# Patient Record
Sex: Female | Born: 1950
Health system: Southern US, Community
[De-identification: ages and names within clinical notes are randomized; demographics above are authoritative.]

## PROBLEM LIST (undated history)

## (undated) ENCOUNTER — Emergency Department (HOSPITAL_COMMUNITY): Payer: Medicare PPO | Source: Home / Self Care

## (undated) DIAGNOSIS — I10 Essential (primary) hypertension: Secondary | ICD-10-CM

## (undated) DIAGNOSIS — I779 Disorder of arteries and arterioles, unspecified: Secondary | ICD-10-CM

## (undated) DIAGNOSIS — R51 Headache: Secondary | ICD-10-CM

## (undated) DIAGNOSIS — K859 Acute pancreatitis without necrosis or infection, unspecified: Secondary | ICD-10-CM

## (undated) DIAGNOSIS — F419 Anxiety disorder, unspecified: Secondary | ICD-10-CM

## (undated) DIAGNOSIS — I341 Nonrheumatic mitral (valve) prolapse: Secondary | ICD-10-CM

## (undated) DIAGNOSIS — G709 Myoneural disorder, unspecified: Secondary | ICD-10-CM

## (undated) DIAGNOSIS — J45909 Unspecified asthma, uncomplicated: Secondary | ICD-10-CM

## (undated) DIAGNOSIS — M1990 Unspecified osteoarthritis, unspecified site: Secondary | ICD-10-CM

## (undated) DIAGNOSIS — T7840XA Allergy, unspecified, initial encounter: Secondary | ICD-10-CM

## (undated) DIAGNOSIS — K219 Gastro-esophageal reflux disease without esophagitis: Secondary | ICD-10-CM

## (undated) DIAGNOSIS — J189 Pneumonia, unspecified organism: Secondary | ICD-10-CM

## (undated) DIAGNOSIS — R011 Cardiac murmur, unspecified: Secondary | ICD-10-CM

## (undated) DIAGNOSIS — M810 Age-related osteoporosis without current pathological fracture: Secondary | ICD-10-CM

## (undated) DIAGNOSIS — D693 Immune thrombocytopenic purpura: Secondary | ICD-10-CM

## (undated) DIAGNOSIS — R519 Headache, unspecified: Secondary | ICD-10-CM

## (undated) HISTORY — DX: Acute pancreatitis without necrosis or infection, unspecified: K85.90

## (undated) HISTORY — DX: Allergy, unspecified, initial encounter: T78.40XA

## (undated) HISTORY — PX: ABDOMINAL HYSTERECTOMY: SHX81

## (undated) HISTORY — PX: SPLENECTOMY, TOTAL: SHX788

## (undated) HISTORY — PX: APPENDECTOMY: SHX54

## (undated) HISTORY — PX: OVARIAN CYST SURGERY: SHX726

## (undated) HISTORY — PX: JOINT REPLACEMENT: SHX530

## (undated) HISTORY — PX: SHOULDER ARTHROSCOPY WITH ROTATOR CUFF REPAIR: SHX5685

## (undated) HISTORY — PX: HERNIA REPAIR: SHX51

---

## 2000-10-05 ENCOUNTER — Other Ambulatory Visit: Admission: RE | Admit: 2000-10-05 | Discharge: 2000-10-05 | Payer: Self-pay | Admitting: Gynecology

## 2004-04-23 ENCOUNTER — Other Ambulatory Visit: Admission: RE | Admit: 2004-04-23 | Discharge: 2004-04-23 | Payer: Self-pay | Admitting: Gynecology

## 2004-09-03 ENCOUNTER — Ambulatory Visit: Payer: Self-pay | Admitting: Gastroenterology

## 2004-09-08 ENCOUNTER — Ambulatory Visit: Payer: Self-pay | Admitting: Gastroenterology

## 2004-09-28 ENCOUNTER — Ambulatory Visit: Payer: Self-pay | Admitting: Gastroenterology

## 2005-05-12 ENCOUNTER — Ambulatory Visit: Payer: Self-pay | Admitting: Gastroenterology

## 2006-06-16 ENCOUNTER — Other Ambulatory Visit: Payer: Self-pay

## 2006-06-21 ENCOUNTER — Ambulatory Visit: Payer: Self-pay | Admitting: Surgery

## 2006-12-12 ENCOUNTER — Emergency Department: Payer: Self-pay | Admitting: Emergency Medicine

## 2009-04-14 DIAGNOSIS — G43909 Migraine, unspecified, not intractable, without status migrainosus: Secondary | ICD-10-CM | POA: Insufficient documentation

## 2009-04-14 DIAGNOSIS — R011 Cardiac murmur, unspecified: Secondary | ICD-10-CM | POA: Insufficient documentation

## 2009-04-14 DIAGNOSIS — G4700 Insomnia, unspecified: Secondary | ICD-10-CM | POA: Insufficient documentation

## 2009-04-14 DIAGNOSIS — N958 Other specified menopausal and perimenopausal disorders: Secondary | ICD-10-CM | POA: Insufficient documentation

## 2009-04-14 DIAGNOSIS — F341 Dysthymic disorder: Secondary | ICD-10-CM | POA: Insufficient documentation

## 2009-04-14 DIAGNOSIS — I10 Essential (primary) hypertension: Secondary | ICD-10-CM | POA: Insufficient documentation

## 2009-05-05 ENCOUNTER — Ambulatory Visit: Payer: Self-pay | Admitting: Family Medicine

## 2009-12-24 ENCOUNTER — Ambulatory Visit: Payer: Self-pay | Admitting: General Practice

## 2010-01-04 ENCOUNTER — Ambulatory Visit: Payer: Self-pay | Admitting: General Practice

## 2010-01-10 ENCOUNTER — Observation Stay: Payer: Self-pay | Admitting: Internal Medicine

## 2010-06-27 ENCOUNTER — Emergency Department (HOSPITAL_COMMUNITY)
Admission: EM | Admit: 2010-06-27 | Discharge: 2010-06-27 | Payer: Self-pay | Source: Home / Self Care | Admitting: Emergency Medicine

## 2010-07-06 ENCOUNTER — Ambulatory Visit: Payer: Self-pay

## 2010-09-06 LAB — POCT I-STAT, CHEM 8
Glucose, Bld: 86 mg/dL (ref 70–99)
HCT: 42 % (ref 36.0–46.0)
Hemoglobin: 14.3 g/dL (ref 12.0–15.0)
Potassium: 3.5 mEq/L (ref 3.5–5.1)
Sodium: 141 mEq/L (ref 135–145)

## 2010-09-06 LAB — CBC
MCH: 32.4 pg (ref 26.0–34.0)
MCHC: 35.2 g/dL (ref 30.0–36.0)
Platelets: 164 10*3/uL (ref 150–400)
RDW: 13.9 % (ref 11.5–15.5)

## 2010-09-06 LAB — DIFFERENTIAL
Basophils Relative: 1 % (ref 0–1)
Eosinophils Absolute: 0.3 10*3/uL (ref 0.0–0.7)
Neutrophils Relative %: 54 % (ref 43–77)

## 2010-10-13 ENCOUNTER — Ambulatory Visit: Payer: Self-pay | Admitting: Family Medicine

## 2010-12-20 ENCOUNTER — Ambulatory Visit: Payer: Self-pay | Admitting: Ophthalmology

## 2010-12-28 ENCOUNTER — Ambulatory Visit: Payer: Self-pay | Admitting: Ophthalmology

## 2011-01-04 ENCOUNTER — Ambulatory Visit: Payer: Self-pay | Admitting: Family Medicine

## 2011-02-01 ENCOUNTER — Ambulatory Visit: Payer: Self-pay | Admitting: Ophthalmology

## 2011-09-05 ENCOUNTER — Ambulatory Visit: Payer: Self-pay | Admitting: Internal Medicine

## 2011-12-22 ENCOUNTER — Ambulatory Visit: Payer: Self-pay | Admitting: Family Medicine

## 2012-09-27 ENCOUNTER — Ambulatory Visit: Payer: Self-pay | Admitting: Family Medicine

## 2012-09-28 DIAGNOSIS — E559 Vitamin D deficiency, unspecified: Secondary | ICD-10-CM | POA: Insufficient documentation

## 2013-02-12 ENCOUNTER — Ambulatory Visit: Payer: Self-pay | Admitting: Family Medicine

## 2013-10-04 ENCOUNTER — Ambulatory Visit: Payer: Self-pay | Admitting: Orthopedic Surgery

## 2013-12-26 ENCOUNTER — Ambulatory Visit: Payer: Self-pay | Admitting: General Practice

## 2014-01-06 ENCOUNTER — Ambulatory Visit: Payer: Self-pay | Admitting: General Practice

## 2014-01-14 DIAGNOSIS — Z9889 Other specified postprocedural states: Secondary | ICD-10-CM | POA: Insufficient documentation

## 2014-10-18 NOTE — Op Note (Signed)
PATIENT NAME:  Heidi Hernandez, Heidi Hernandez MR#:  [id number] DATE OF BIRTH:  June 02, 1951  DATE OF PROCEDURE:  01/06/2014  PREOPERATIVE DIAGNOSIS: Left rotator cuff tear.   POSTOPERATIVE DIAGNOSIS: Left rotator cuff tear (supraspinatus).   PROCEDURE PERFORMED: Left subacromial decompression and rotator cuff repair.   SURGEON:  Dr. Francesco SorJames Hooten    ANESTHESIA: General.   ESTIMATED BLOOD LOSS: 75 mL.   FLUIDS REPLACED: 900 mL of crystalloid.   DRAINS: None.   IMPLANTS UTILIZED: ArthroCare 5.5 mm Spartan PEEK suture anchors x 2.   INDICATIONS FOR SURGERY: The patient is a 64 year old female who has been seen for complaints of persistent and progressive left shoulder pain. MRI demonstrated findings suggestive of partial thickness versus full-thickness rotator cuff tear. After discussion of the risks and benefits of surgical intervention, the patient expressed understanding of the risks, benefits, and agreed with plans for surgical intervention.   PROCEDURE IN DETAIL: The patient was brought into the operating room, and after adequate general anesthesia was achieved, the patient was placed in the modified beach chair position. Head was secured in headrest and all bony prominences were well padded. The patient's left shoulder and arm were cleaned and prepped with alcohol and DuraPrep draped in the usual sterile fashion. A "timeout" was performed as per usual protocol. The anticipated incision site was injected with 0.25% Marcaine with epinephrine. An anterior oblique incision was made roughly bisecting the anterior aspect of the acromion. The fibers of deltoid were split in line and a "deltoid on" approach was utilized by elevating the deltoid in a subperiosteal fashion off the acromion.  A thick and inflamed bursa was encountered and this was excised. A Darrach retractor was positioned so as to protect the rotator cuff. Approximately 3 mm thick anterior-inferior wafer of bone was removed using an osteotome.  Subsequent debridement and contouring of the subacromial region was performed using a TPS high-speed rasp. This allowed for excellent decompression of subacromial space. The shoulder was placed through a range of motion. There was an area of full-thickness tear of the supraspinatus tendon centrally. Minimal retraction was appreciated.  The attachment site was debrided of soft tissue down to bleeding bone using a rongeur. Two 5.5 mm Spartan PEEK suture anchors were inserted. The leading edge of the supraspinatus tendon was debrided and cleaned up sharply using Metzenbaum scissors. The leading edge of the supraspinatus tendon was then repaired using the associated sutures. Excellent repair was appreciated with good maintenance of repair noted with full range of motion of the shoulder. The wound was irrigated with copious amounts of normal saline with antibiotic solution using bulb syringe and then suctioned dry. Good hemostasis was appreciated. The deltoid was repaired in a side-to-side fashion using interrupted sutures of #1 Ethibond. The subcutaneous tissue was approximated using first #0 Vicryl followed by #2-0 Vicryl. The skin was closed with a running subcuticular suture of #4-0 Vicryl.  A sterile dressing was applied followed by application of a sling and ice wrap. The patient tolerated the procedure well. She was transported to the recovery room in stable condition.    ____________________________ Illene LabradorJames P. Angie FavaHooten Jr., MD jph:dd D: 01/06/2014 22:03:55 ET T: 01/07/2014 05:21:29 ET JOB#: [id number]  cc: Illene LabradorJames P. Angie FavaHooten Jr., MD, <Dictator> Illene LabradorJAMES P Angie FavaHOOTEN JR MD ELECTRONICALLY SIGNED 01/14/2014 19:44

## 2014-11-10 ENCOUNTER — Encounter
Admission: RE | Admit: 2014-11-10 | Discharge: 2014-11-10 | Disposition: A | Discharge status: Home / Self Care | Payer: Medicare PPO | Source: Ambulatory Visit | Attending: Orthopedic Surgery | Admitting: Orthopedic Surgery

## 2014-11-10 DIAGNOSIS — G5600 Carpal tunnel syndrome, unspecified upper limb: Secondary | ICD-10-CM | POA: Diagnosis not present

## 2014-11-10 HISTORY — DX: Headache: R51

## 2014-11-10 HISTORY — DX: Unspecified asthma, uncomplicated: J45.909

## 2014-11-10 HISTORY — DX: Headache, unspecified: R51.9

## 2014-11-10 HISTORY — DX: Nonrheumatic mitral (valve) prolapse: I34.1

## 2014-11-10 HISTORY — DX: Essential (primary) hypertension: I10

## 2014-11-10 HISTORY — DX: Unspecified osteoarthritis, unspecified site: M19.90

## 2014-11-10 NOTE — Patient Instructions (Signed)
  Your procedure is scheduled on: 11/19/14 Report to Day Surgery. To find out your arrival time please call [phone] between 1PM - 3PM on 11/18/14.  Remember: Instructions that are not followed completely may result in serious medical risk, up to and including death, or upon the discretion of your surgeon and anesthesiologist your surgery may need to be rescheduled.    ____ 1. Do not eat food or drink liquids after midnight. No gum chewing or hard candies.     ____ 2. No Alcohol for 24 hours before or after surgery.   ____ 3. Bring all medications with you on the day of surgery if instructed.    ____ 4. Notify your doctor if there is any change in your medical condition     (cold, fever, infections).     Do not wear jewelry, make-up, hairpins, clips or nail polish.  Do not wear lotions, powders, or perfumes. You may wear deodorant.  Do not shave 48 hours prior to surgery. Men may shave face and neck.  Do not bring valuables to the hospital.    [facility] any belongings or valuables.               Contacts, dentures or bridgework may not be worn into surgery.  Leave your suitcase in the car. After surgery it may be brought to your room.  For patients admitted to the hospital, discharge time is determined by your                treatment team.   Patients discharged the day of surgery will not be allowed to drive home.   Please read over the following fact sheets that you were given:   Surgical Site Infection Prevention   ____ Take these medicines the morning of surgery with A SIP OF WATER:    1.  2.   3.   4.  5.  6.  ____ Fleet Enema (as directed)   ___x_ Use CHG Soap as directed  _x___ Use inhalers on the day of surgery  ____ Stop metformin 2 days prior to surgery    ____ Take 1/2 of usual insulin dose the night before surgery and none on the morning of surgery.   ____ Stop Coumadin/Plavix/aspirin on   ____ Stop Anti-inflammatories on     ____ Stop supplements until after surgery.    ____ Bring C-Pap to the hospital.

## 2014-11-10 NOTE — OR Nursing (Signed)
ekg done 7/15 at [facility]

## 2014-11-19 ENCOUNTER — Ambulatory Visit: Payer: Medicare PPO | Admitting: Anesthesiology

## 2014-11-19 ENCOUNTER — Encounter: Payer: Self-pay | Admitting: Anesthesiology

## 2014-11-19 ENCOUNTER — Ambulatory Visit
Admission: RE | Admit: 2014-11-19 | Discharge: 2014-11-19 | Disposition: A | Discharge status: Home / Self Care | Payer: Medicare PPO | Source: Ambulatory Visit | Attending: Orthopedic Surgery | Admitting: Orthopedic Surgery

## 2014-11-19 ENCOUNTER — Encounter
Admission: RE | Disposition: A | Discharge status: Home / Self Care | Payer: Self-pay | Source: Ambulatory Visit | Attending: Orthopedic Surgery

## 2014-11-19 DIAGNOSIS — Z79899 Other long term (current) drug therapy: Secondary | ICD-10-CM | POA: Insufficient documentation

## 2014-11-19 DIAGNOSIS — Z87891 Personal history of nicotine dependence: Secondary | ICD-10-CM | POA: Diagnosis not present

## 2014-11-19 DIAGNOSIS — Z885 Allergy status to narcotic agent status: Secondary | ICD-10-CM | POA: Insufficient documentation

## 2014-11-19 DIAGNOSIS — G5601 Carpal tunnel syndrome, right upper limb: Secondary | ICD-10-CM | POA: Insufficient documentation

## 2014-11-19 DIAGNOSIS — G5602 Carpal tunnel syndrome, left upper limb: Secondary | ICD-10-CM | POA: Insufficient documentation

## 2014-11-19 DIAGNOSIS — G43909 Migraine, unspecified, not intractable, without status migrainosus: Secondary | ICD-10-CM | POA: Diagnosis not present

## 2014-11-19 DIAGNOSIS — Z888 Allergy status to other drugs, medicaments and biological substances status: Secondary | ICD-10-CM | POA: Insufficient documentation

## 2014-11-19 DIAGNOSIS — D693 Immune thrombocytopenic purpura: Secondary | ICD-10-CM | POA: Diagnosis not present

## 2014-11-19 DIAGNOSIS — M6530 Trigger finger, unspecified finger: Secondary | ICD-10-CM

## 2014-11-19 DIAGNOSIS — M65311 Trigger thumb, right thumb: Secondary | ICD-10-CM | POA: Insufficient documentation

## 2014-11-19 DIAGNOSIS — I10 Essential (primary) hypertension: Secondary | ICD-10-CM | POA: Diagnosis not present

## 2014-11-19 DIAGNOSIS — I341 Nonrheumatic mitral (valve) prolapse: Secondary | ICD-10-CM | POA: Insufficient documentation

## 2014-11-19 DIAGNOSIS — G5600 Carpal tunnel syndrome, unspecified upper limb: Secondary | ICD-10-CM

## 2014-11-19 HISTORY — PX: TRIGGER FINGER RELEASE: SHX641

## 2014-11-19 HISTORY — PX: CARPAL TUNNEL RELEASE: SHX101

## 2014-11-19 SURGERY — CARPAL TUNNEL RELEASE
Anesthesia: General | Site: Thumb | Laterality: Right

## 2014-11-19 MED ORDER — PROPOFOL 10 MG/ML IV BOLUS
INTRAVENOUS | Status: DC | PRN
  Administered 2014-11-19: 200 mg via INTRAVENOUS
  Administered 2014-11-19: 50 mg via INTRAVENOUS

## 2014-11-19 MED ORDER — NEOMYCIN-POLYMYXIN B GU 40-200000 IR SOLN
Status: AC
  Filled 2014-11-19: qty 2

## 2014-11-19 MED ORDER — GLYCOPYRROLATE 0.2 MG/ML IJ SOLN
INTRAMUSCULAR | Status: DC | PRN
  Administered 2014-11-19: 0.2 mg via INTRAVENOUS

## 2014-11-19 MED ORDER — ONDANSETRON HCL 4 MG/2ML IJ SOLN
INTRAMUSCULAR | Status: DC | PRN
Start: 2014-11-19 — End: 2014-11-19
  Administered 2014-11-19: 4 mg via INTRAVENOUS

## 2014-11-19 MED ORDER — MIDAZOLAM HCL 2 MG/2ML IJ SOLN
INTRAMUSCULAR | Status: DC | PRN
  Administered 2014-11-19: 2 mg via INTRAVENOUS

## 2014-11-19 MED ORDER — HYDROCODONE-ACETAMINOPHEN 5-325 MG PO TABS: 1.0000 | ORAL_TABLET | ORAL | Status: DC | PRN

## 2014-11-19 MED ORDER — ONDANSETRON HCL 4 MG/2ML IJ SOLN: 4.0000 mg | Freq: Once | INTRAMUSCULAR | Status: DC | PRN

## 2014-11-19 MED ORDER — METOCLOPRAMIDE HCL 5 MG/ML IJ SOLN: 5.0000 mg | Freq: Three times a day (TID) | INTRAMUSCULAR | Status: DC | PRN

## 2014-11-19 MED ORDER — BUPIVACAINE HCL (PF) 0.25 % IJ SOLN
INTRAMUSCULAR | Status: DC | PRN
  Administered 2014-11-19 (×2): 10 mL

## 2014-11-19 MED ORDER — CHLORHEXIDINE GLUCONATE 4 % EX LIQD
60.0000 mL | Freq: Once | CUTANEOUS | Status: AC
  Administered 2014-11-19: 4 via TOPICAL

## 2014-11-19 MED ORDER — ACETAMINOPHEN 10 MG/ML IV SOLN
INTRAVENOUS | Status: DC | PRN
  Administered 2014-11-19: 1000 mg via INTRAVENOUS

## 2014-11-19 MED ORDER — ONDANSETRON HCL 4 MG PO TABS: 4.0000 mg | ORAL_TABLET | Freq: Four times a day (QID) | ORAL | Status: DC | PRN

## 2014-11-19 MED ORDER — BUPIVACAINE HCL (PF) 0.25 % IJ SOLN
INTRAMUSCULAR | Status: AC
  Filled 2014-11-19: qty 30

## 2014-11-19 MED ORDER — NEOMYCIN-POLYMYXIN B GU 40-200000 IR SOLN
Status: DC | PRN
  Administered 2014-11-19: 200 mL

## 2014-11-19 MED ORDER — EPHEDRINE SULFATE 50 MG/ML IJ SOLN
INTRAMUSCULAR | Status: DC | PRN
  Administered 2014-11-19: 5 mg via INTRAVENOUS

## 2014-11-19 MED ORDER — METOCLOPRAMIDE HCL 10 MG PO TABS: 5.0000 mg | ORAL_TABLET | Freq: Three times a day (TID) | ORAL | Status: DC | PRN

## 2014-11-19 MED ORDER — FENTANYL CITRATE (PF) 100 MCG/2ML IJ SOLN
INTRAMUSCULAR | Status: DC | PRN
  Administered 2014-11-19 (×2): 50 ug via INTRAVENOUS
  Administered 2014-11-19: 25 ug via INTRAVENOUS
  Administered 2014-11-19: 50 ug via INTRAVENOUS
  Administered 2014-11-19: 25 ug via INTRAVENOUS
  Administered 2014-11-19: 50 ug via INTRAVENOUS

## 2014-11-19 MED ORDER — ONDANSETRON HCL 4 MG/2ML IJ SOLN: 4.0000 mg | Freq: Four times a day (QID) | INTRAMUSCULAR | Status: DC | PRN

## 2014-11-19 MED ORDER — LACTATED RINGERS IV SOLN
INTRAVENOUS | Status: DC
  Administered 2014-11-19: 15:00:00 via INTRAVENOUS

## 2014-11-19 MED ORDER — ACETAMINOPHEN 10 MG/ML IV SOLN
INTRAVENOUS | Status: AC
  Filled 2014-11-19: qty 100

## 2014-11-19 MED ORDER — LIDOCAINE HCL (PF) 1 % IJ SOLN
INTRAMUSCULAR | Status: AC
  Filled 2014-11-19: qty 2

## 2014-11-19 MED ORDER — FENTANYL CITRATE (PF) 100 MCG/2ML IJ SOLN: 25.0000 ug | INTRAMUSCULAR | Status: DC | PRN

## 2014-11-19 SURGICAL SUPPLY — 37 items
BANDAGE ELASTIC 3 CLIP NS LF (GAUZE/BANDAGES/DRESSINGS) ×6 IMPLANT
BANDAGE ELASTIC 3 CLIP ST LF (GAUZE/BANDAGES/DRESSINGS) ×3 IMPLANT
BLADE SURG 15 STRL LF DISP TIS (BLADE) ×4 IMPLANT
BLADE SURG 15 STRL SS (BLADE) ×2
BNDG ESMARK 4X12 TAN STRL LF (GAUZE/BANDAGES/DRESSINGS) ×3 IMPLANT
CANISTER SUCT 1200ML W/VALVE (MISCELLANEOUS) ×3 IMPLANT
CAST PADDING 3X4FT ST 30246 (SOFTGOODS) ×2
DECANTER SPIKE VIAL GLASS SM (MISCELLANEOUS) ×3 IMPLANT
DRAPE EXTREMITY 106X87X128.5 (DRAPES) ×3 IMPLANT
DRAPE IMP U-DRAPE 54X76 (DRAPES) ×3 IMPLANT
DRSG DERMACEA 8X12 NADH (GAUZE/BANDAGES/DRESSINGS) ×3 IMPLANT
DURAPREP 26ML APPLICATOR (WOUND CARE) ×9 IMPLANT
ELECT CAUTERY BLADE 6.4 (BLADE) ×3 IMPLANT
GAUZE SPONGE 4X4 12PLY STRL (GAUZE/BANDAGES/DRESSINGS) ×3 IMPLANT
GLOVE BIO SURGEON STRL SZ7 (GLOVE) ×6 IMPLANT
GLOVE BIOGEL M STRL SZ7.5 (GLOVE) ×6 IMPLANT
GLOVE INDICATOR 7.0 STRL GRN (GLOVE) ×3 IMPLANT
GLOVE INDICATOR 8.0 STRL GRN (GLOVE) ×3 IMPLANT
GOWN STRL REUS W/ TWL LRG LVL3 (GOWN DISPOSABLE) ×2 IMPLANT
GOWN STRL REUS W/TWL LRG LVL3 (GOWN DISPOSABLE) ×1
GOWN STRL REUS W/TWL XL LVL4 (GOWN DISPOSABLE) ×3 IMPLANT
IMPERVIOUS U DRAPE ×3 IMPLANT
NEEDLE FILTER BLUNT 18X 1/2SAF (NEEDLE) ×1
NEEDLE FILTER BLUNT 18X1 1/2 (NEEDLE) ×2 IMPLANT
NEEDLE HYPO 25X1 1.5 SAFETY (NEEDLE) ×3 IMPLANT
NS IRRIG 500ML POUR BTL (IV SOLUTION) ×3 IMPLANT
PACK EXTREMITY ARMC (MISCELLANEOUS) ×3 IMPLANT
PAD CAST CTTN 3X4 STRL (SOFTGOODS) ×4 IMPLANT
PAD GROUND ADULT SPLIT (MISCELLANEOUS) ×3 IMPLANT
SOL PREP PVP 2OZ (MISCELLANEOUS) ×3
SOLUTION PREP PVP 2OZ (MISCELLANEOUS) ×2 IMPLANT
SPLINT CAST 1 STEP 3X12 (MISCELLANEOUS) ×6 IMPLANT
STOCKINETTE STRL 4IN 9604848 (GAUZE/BANDAGES/DRESSINGS) ×6 IMPLANT
STRAP SAFETY BODY (MISCELLANEOUS) ×3 IMPLANT
SUT ETHILON 5-0 FS-2 18 BLK (SUTURE) ×6 IMPLANT
SYR 5ML LL (SYRINGE) ×3 IMPLANT
VIALFLOW ×3 IMPLANT

## 2014-11-19 NOTE — Op Note (Signed)
DATE OF SURGERY:  11/19/2014  PATIENT NAME:  Heidi Hernandez   DOB: 04/03/1951  MRN: [id number]  PRE-OPERATIVE DIAGNOSIS: Bilateral carpal tunnel syndrome, stenosing tenosynovitis of the right thumb  POST-OPERATIVE DIAGNOSIS:  Same  PROCEDURE:  Bilateral carpal tunnel release, Right trigger thumb release  SURGEON:  Jena GaussJames P Hooten, Jr. M.D.  ANESTHESIA: general  ESTIMATED BLOOD LOSS: Minimal  TOURNIQUET TIME: 21 minutes - LEFT     33 minutes - RIGHT  DRAINS: None  INDICATIONS FOR SURGERY: Heidi Hernandez is a 64 y.o. year old female with a long history of numbness and paresthesias to both hands as well as triggering of the right thumb. EMG/nerve conduction studies demonstrated findings consistent with bilateral carpal tunnel syndrome.The patient had not seen any significant improvement despite conservative nonsurgical intervention. After discussion of the risks and benefits of surgical intervention, the patient expressed understanding of the risks benefits and agree with plans for bilateral carpal tunnel release and right trigger thumb release.   PROCEDURE IN DETAIL: The patient was brought into the operating room and after adequate general anesthesia, a tourniquet was placed on the patient's upper arms. Both hands and arms were prepped with alcohol and Duraprep and draped in the usual sterile fashion. A "time-out" was performed as per usual protocol. The left hand and forearm were exsanguinated using an Esmarch and the tourniquet was inflated to 250 mmHg. Loupe magnification was used throughout the procedure. An incision was made just ulnar to the thenar palmar crease. Dissection was carried down through the palmar fascia to the transverse carpal ligament. The transverse carpal ligament was sharply incised, taking care to protect the underlying structures with the carpal tunnel. Complete release of the transverse carpal ligament was achieved. There was no evidence of ganglion cyst or lipoma  within the carpal tunnel. The wound was irrigated with copious amounts of normal saline with antibiotic solution. The skin was then re-approximated with interrupted sutures of #5-0 nylon. The incision was then injected with 10 mL of 0.25% Marcaine. A sterile dressing was applied followed by application of a volar splint. The tourniquet to the left arm was deflated with a total tourniquet time of 21 minutes.  Next, attention was directed to the right upper extremity. The right hand and forearm were exsanguinated using an Esmarch and the tourniquet was inflated to 250 mmHg. An incision was made just ulnar to the thenar palmar crease. Dissection was carried down through the palmar fascia to the transverse carpal ligament. The transverse carpal ligament was sharply incised, taking care to protect the underlying structures with the carpal tunnel. Complete release of the transverse carpal ligament was achieved. There was no evidence of ganglion cyst or lipoma within the carpal tunnel. The wound was irrigated with copious amounts of normal saline with antibiotic solution. The skin was then re-approximated with interrupted sutures of #5-0 nylon. A transverse incision was then made at the base of the right thumb. Blunt dissection was carried down to the A1 pulley and the A1 pulley was sharply incised. A moderate amount of tenosynovial fluid was evacuated. Smooth excursion of the flexor tendon was noted without any triggering. The wound was irrigated with copious amounts of normal saline, solution and the skin edges were then reapproximated using #5-0 nylon. The incision sites were then injected with 10 mL of 0.25% Marcaine. A sterile dressing was applied followed by application of a volar splint. The tourniquet to the right arm was deflated with a total tourniquet time of 33 minutes.  The patient tolerated the procedure well and was transported to the PACU in stable condition.  James P. Holley Bouche., M.D.

## 2014-11-19 NOTE — Anesthesia Procedure Notes (Signed)
Procedure Name: LMA Insertion Date/Time: 11/19/2014 3:59 PM Performed by: Omer JackWEATHERLY, Heidi Hernandez: Patient identified, Emergency Drugs available, Suction available, Patient being monitored and Timeout performed Patient Re-evaluated:Patient Re-evaluated prior to inductionOxygen Delivery Method: Circle system utilized Preoxygenation: Pre-oxygenation with 100% oxygen Intubation Type: IV induction Ventilation: Mask ventilation without difficulty LMA: LMA inserted LMA Size: 3.5 Placement Confirmation: breath sounds checked- equal and bilateral and positive ETCO2 Tube secured with: Tape Dental Injury: Teeth and Oropharynx as per pre-operative assessment

## 2014-11-19 NOTE — Anesthesia Preprocedure Evaluation (Addendum)
Anesthesia Evaluation  Patient identified by MRN, date of birth, ID band Patient awake    Reviewed: Allergy & Precautions, H&P , NPO status , Patient's Chart, lab work & pertinent test results  Airway Mallampati: II  TM Distance: >3 FB Neck ROM: Full    Dental  (+) Chipped   Pulmonary asthma , former smoker,          Cardiovascular Exercise Tolerance: Good hypertension,     Neuro/Psych  Headaches,    GI/Hepatic   Endo/Other    Renal/GU      Musculoskeletal  (+) Arthritis -,   Abdominal   Peds  Hematology   Anesthesia Other Findings MVP.  Reproductive/Obstetrics                        Anesthesia Physical Anesthesia Plan  ASA: II  Anesthesia Plan: General   Post-op Pain Management:    Induction:   Airway Management Planned: LMA  Additional Equipment:   Intra-op Plan:   Post-operative Plan:   Informed Consent: I have reviewed the patients History and Physical, chart, labs and discussed the procedure including the risks, benefits and alternatives for the proposed anesthesia with the patient or authorized representative who has indicated his/her understanding and acceptance.     Plan Discussed with: CRNA  Anesthesia Plan Comments:        Anesthesia Quick Evaluation

## 2014-11-19 NOTE — H&P (Signed)
The patient has been re-examined, and the chart reviewed, and there have been no interval changes to the documented history and physical.    The risks, benefits, and alternatives have been discussed at length, and the patient is willing to proceed.   

## 2014-11-19 NOTE — Brief Op Note (Signed)
11/19/2014  5:48 PM  PATIENT:  Haywood Paoarol M Joffe  64 y.o. female  PRE-OPERATIVE DIAGNOSIS:  bil carpal tunnel syndrome,,right trigger thumb  POST-OPERATIVE DIAGNOSIS:  same as pre-op  PROCEDURE:  Procedure(s): CARPAL TUNNEL RELEASE (Bilateral) RELEASE TRIGGER FINGER/A-1 PULLEY (Right) thumb  SURGEON:  Surgeon(s) and Role:    * Donato HeinzJames P Hooten, MD - Primary  ASSISTANTS: none   ANESTHESIA:   general  EBL:     BLOOD ADMINISTERED:none  DRAINS: none   LOCAL MEDICATIONS USED:  MARCAINE     SPECIMEN:  No Specimen  DISPOSITION OF SPECIMEN:  N/A  COUNTS:  YES  TOURNIQUET:  LEFT - 21 min      RIGHT - 33 min  DICTATION: .Reubin Milanragon Dictation  PLAN OF CARE: Discharge to home after PACU  PATIENT DISPOSITION:  PACU - hemodynamically stable.   Delay start of Pharmacological VTE agent (>24hrs) due to surgical blood loss or risk of bleeding: not applicable

## 2014-11-19 NOTE — Anesthesia Postprocedure Evaluation (Signed)
  Anesthesia Post-op Note  Patient: Heidi Hernandez  Procedure(s) Performed: Procedure(s): CARPAL TUNNEL RELEASE (Bilateral) RELEASE TRIGGER FINGER/A-1 PULLEY (Right)  Anesthesia type:General  Patient location: PACU  Post pain: Pain level controlled  Post assessment: Post-op Vital signs reviewed, Patient's Cardiovascular Status Stable, Respiratory Function Stable, Patent Airway and No signs of Nausea or vomiting  Post vital signs: Reviewed and stable  Last Vitals:  Filed Vitals:   11/19/14 1802  BP: 172/61  Pulse: 70  Temp:   Resp: 12    Level of consciousness: awake, alert  and patient cooperative  Complications: No apparent anesthesia complications

## 2014-11-19 NOTE — Discharge Instructions (Addendum)
General Anesthesia °General anesthesia is a sleep-like state of non-feeling produced by medicines (anesthetics). General anesthesia prevents you from being alert and feeling pain during a medical procedure. Your caregiver may recommend general anesthesia if your procedure: °· Is long. °· Is painful or uncomfortable. °· Would be frightening to see or hear. °· Requires you to be still. °· Affects your breathing. °· Causes significant blood loss. °LET YOUR CAREGIVER KNOW ABOUT: °· Allergies to food or medicine. °· Medicines taken, including vitamins, herbs, eyedrops, over-the-counter medicines, and creams. °· Use of steroids (by mouth or creams). °· Previous problems with anesthetics or numbing medicines, including problems experienced by relatives. °· History of bleeding problems or blood clots. °· Previous surgeries and types of anesthetics received. °· Possibility of pregnancy, if this applies. °· Use of cigarettes, alcohol, or illegal drugs. °· Any health condition(s), especially diabetes, sleep apnea, and high blood pressure. °RISKS AND COMPLICATIONS °General anesthesia rarely causes complications. However, if complications do occur, they can be life threatening. Complications include: °· A lung infection. °· A stroke. °· A heart attack. °· Waking up during the procedure. When this occurs, the patient may be unable to move and communicate that he or she is awake. The patient may feel severe pain. °Older adults and adults with serious medical problems are more likely to have complications than adults who are young and healthy. Some complications can be prevented by answering all of your caregiver's questions thoroughly and by following all pre-procedure instructions. It is important to tell your caregiver if any of the pre-procedure instructions, especially those related to diet, were not followed. Any food or liquid in the stomach can cause problems when you are under general anesthesia. °BEFORE THE  PROCEDURE °· Ask your caregiver if you will have to spend the night at the hospital. If you will not have to spend the night, arrange to have an adult drive you and stay with you for 24 hours. °· Follow your caregiver's instructions if you are taking dietary supplements or medicines. Your caregiver may tell you to stop taking them or to reduce your dosage. °· Do not smoke for as long as possible before your procedure. If possible, stop smoking 3-6 weeks before the procedure. °· Do not take new dietary supplements or medicines within 1 week of your procedure unless your caregiver approves them. °· Do not eat within 8 hours of your procedure or as directed by your caregiver. Drink only clear liquids, such as water, black coffee (without milk or cream), and fruit juices (without pulp). °· Do not drink within 3 hours of your procedure or as directed by your caregiver. °· You may brush your teeth on the morning of the procedure, but make sure to spit out the toothpaste and water when finished. °PROCEDURE  °You will receive anesthetics through a mask, through an intravenous (IV) access tube, or through both. A doctor who specializes in anesthesia (anesthesiologist) or a nurse who specializes in anesthesia (nurse anesthetist) or both will stay with you throughout the procedure to make sure you remain unconscious. He or she will also watch your blood pressure, pulse, and oxygen levels to make sure that the anesthetics do not cause any problems. Once you are asleep, a breathing tube or mask may be used to help you breathe. °AFTER THE PROCEDURE °You will wake up after the procedure is complete. You may be in the room where the procedure was performed or in a recovery area. You may have a sore throat if   a breathing tube was used. You may also feel:  Dizzy.  Weak.  Drowsy.  Confused.  Nauseous.  Cold. These are all normal responses and can be expected to last for up to 24 hours after the procedure is complete. A  caregiver will tell you when you are ready to go home. This will usually be when you are fully awake and in stable condition. Document Released: 09/20/2007 Document Revised: 10/28/2013 Document Reviewed: 10/12/2011 [facility] Patient Information 2015 [employer] [place]. This information is not intended to replace advice given to you by your health care provider. Make sure you discuss any questions you have with your health care provider.   PER DR HOOTEN - ACTIVITY AS TOLERATED                                   KEEP OPERATIVE EXTREMITIES ELEVATED ON PILLOWS                                   FOLLOW DR HOOTEN'S POSTOP HAND/WRIST SURGERY INSTRUCTIONS AS REVIEWED

## 2014-11-19 NOTE — Transfer of Care (Signed)
Immediate Anesthesia Transfer of Care Note  Patient: Heidi PaoCarol M Droege  Procedure(s) Performed: Procedure(s): CARPAL TUNNEL RELEASE (Bilateral) RELEASE TRIGGER FINGER/A-1 PULLEY (Right)  Patient Location: PACU  Anesthesia Type:General  Level of Consciousness: sedated  Airway & Oxygen Therapy: Patient connected to face mask oxygen  Post-op Assessment: Report given to RN  Post vital signs: stable  Last Vitals: There were no vitals filed for this visit.  Complications: No apparent anesthesia complications

## 2014-11-20 ENCOUNTER — Encounter: Payer: Self-pay | Admitting: Orthopedic Surgery

## 2014-12-23 ENCOUNTER — Ambulatory Visit
Admission: RE | Admit: 2014-12-23 | Discharge: 2014-12-23 | Disposition: A | Discharge status: Home / Self Care | Payer: Medicare PPO | Source: Ambulatory Visit | Attending: Family Medicine | Admitting: Family Medicine

## 2014-12-23 ENCOUNTER — Inpatient Hospital Stay: Admit: 2014-12-23 | Payer: Self-pay

## 2014-12-23 ENCOUNTER — Ambulatory Visit (INDEPENDENT_AMBULATORY_CARE_PROVIDER_SITE_OTHER): Payer: Medicare PPO | Admitting: Family Medicine

## 2014-12-23 ENCOUNTER — Encounter: Payer: Self-pay | Admitting: Family Medicine

## 2014-12-23 VITALS — BP 128/72 | HR 62 | Temp 99.0°F | Resp 16 | Wt 132.0 lb

## 2014-12-23 DIAGNOSIS — Z862 Personal history of diseases of the blood and blood-forming organs and certain disorders involving the immune mechanism: Secondary | ICD-10-CM | POA: Insufficient documentation

## 2014-12-23 DIAGNOSIS — M810 Age-related osteoporosis without current pathological fracture: Secondary | ICD-10-CM | POA: Insufficient documentation

## 2014-12-23 DIAGNOSIS — I341 Nonrheumatic mitral (valve) prolapse: Secondary | ICD-10-CM | POA: Insufficient documentation

## 2014-12-23 DIAGNOSIS — G2581 Restless legs syndrome: Secondary | ICD-10-CM | POA: Insufficient documentation

## 2014-12-23 DIAGNOSIS — M5430 Sciatica, unspecified side: Secondary | ICD-10-CM | POA: Insufficient documentation

## 2014-12-23 DIAGNOSIS — M11272 Other chondrocalcinosis, left ankle and foot: Secondary | ICD-10-CM

## 2014-12-23 DIAGNOSIS — J309 Allergic rhinitis, unspecified: Secondary | ICD-10-CM | POA: Insufficient documentation

## 2014-12-23 DIAGNOSIS — M25572 Pain in left ankle and joints of left foot: Secondary | ICD-10-CM

## 2014-12-23 DIAGNOSIS — I517 Cardiomegaly: Secondary | ICD-10-CM | POA: Insufficient documentation

## 2014-12-23 DIAGNOSIS — F419 Anxiety disorder, unspecified: Secondary | ICD-10-CM | POA: Insufficient documentation

## 2014-12-23 DIAGNOSIS — Z9081 Acquired absence of spleen: Secondary | ICD-10-CM | POA: Insufficient documentation

## 2014-12-23 DIAGNOSIS — L570 Actinic keratosis: Secondary | ICD-10-CM | POA: Insufficient documentation

## 2014-12-23 NOTE — Progress Notes (Signed)
Patient ID: Heidi PaoCarol M Thurmon, female   DOB: 10/06/1950, 64 y.o.   MRN: [id number]       Patient: Heidi Hernandez Female    DOB: 09/08/1950   64 y.o.   MRN: [id number] Visit Date: 12/23/2014  Today's Provider: Mila Merryonald Fisher, MD   Chief Complaint  Patient presents with  . Foot Swelling   Subjective:    HPI Pt reports that she has swelling in her left ankle that has been going on for about 3 months. She does remember doing anything to it. She reports that it feels warm to the touch and is very painful. No known injury. She wore an ankle brace for a few weeks when she was traveling, but couldn't tell if it helped   Previous Medications   ALBUTEROL (VENTOLIN HFA) 108 (90 BASE) MCG/ACT INHALER    Inhale into the lungs.   ALENDRONATE (FOSAMAX) 70 MG TABLET    Take by mouth.   ATENOLOL (TENORMIN) 50 MG TABLET    Take 50 mg by mouth daily. pm   AZELASTINE (ASTELIN) 0.1 % NASAL SPRAY    Place 2 sprays into both nostrils 2 (two) times daily. Use in each nostril as directed   CETIRIZINE (ZYRTEC) 10 MG TABLET    Take 10 mg by mouth daily. pm   CICLESONIDE (ALVESCO) 80 MCG/ACT INHALER    Inhale 1 puff into the lungs 2 (two) times daily.   FLUTICASONE (FLONASE) 50 MCG/ACT NASAL SPRAY       MONTELUKAST (SINGULAIR) 10 MG TABLET    Take 10 mg by mouth at bedtime.   PRAMIPEXOLE (MIRAPEX) 1 MG TABLET    Take 1 mg by mouth 2 (two) times daily.   SUMATRIPTAN (IMITREX) 100 MG TABLET    Take 100 mg by mouth every 2 (two) hours as needed for migraine. May repeat in 2 hours if headache persists or recurs.   TRAMADOL (ULTRAM) 50 MG TABLET    Take 50 mg by mouth every 6 (six) hours as needed. 1 to two tabs   VITAMIN D, ERGOCALCIFEROL, (DRISDOL) 50000 UNITS CAPS CAPSULE    Take by mouth.   ZOLPIDEM (AMBIEN) 10 MG TABLET    Take by mouth.    Review of Systems  Constitutional: Negative.   HENT: Negative.   Eyes: Negative.   Respiratory: Negative.   Cardiovascular: Negative.   Gastrointestinal: Negative.     Endocrine: Negative.   Genitourinary: Negative.   Musculoskeletal: Positive for joint swelling and arthralgias.  Skin: Negative.   Allergic/Immunologic: Negative.   Neurological: Negative.   Hematological: Negative.   Psychiatric/Behavioral: Negative.     History  Substance Use Topics  . Smoking status: Former Smoker    Quit date: 11/10/2014  . Smokeless tobacco: Not on file  . Alcohol Use: Yes     Comment: rare   Objective:   BP 128/72 mmHg  Pulse 62  Temp(Src) 99 F (37.2 C) (Oral)  Resp 16  Wt 132 lb (59.875 kg)  Physical Exam  General Appearance:    Alert, cooperative, no distress  Eyes:    PERRL, conjunctiva/corneas clear, EOM's intact       Lungs:     Clear to auscultation bilaterally, respirations unlabored  Heart:    Regular rate and rhythm  Neurologic:   Awake, alert, oriented x 3. No apparent focal neurological           defect.   MS:   Tender around left lateral malleolus. Mildly swollen surrounding tissue. No  erythema. FROM with pain on limits of passive ROM        Assessment & Plan:     1. Left ankle pain Likely sprain of talo-fibular ligament. Rule out stress fracture. If XRay negative will need to wear ankle brace for 4-6 weeks.  - DG Ankle Complete Left; Future  [name], MD  [facility]

## 2014-12-24 ENCOUNTER — Telehealth: Payer: Self-pay

## 2014-12-24 DIAGNOSIS — M25579 Pain in unspecified ankle and joints of unspecified foot: Secondary | ICD-10-CM

## 2014-12-24 DIAGNOSIS — M11272 Other chondrocalcinosis, left ankle and foot: Secondary | ICD-10-CM | POA: Insufficient documentation

## 2014-12-24 MED ORDER — NAPROXEN 500 MG PO TABS: 500.0000 mg | ORAL_TABLET | Freq: Two times a day (BID) | ORAL | Status: AC

## 2014-12-24 NOTE — Telephone Encounter (Signed)
-----   Message from Malva Limesonald E Fisher, MD sent at 12/24/2014 12:37 PM EDT ----- Please advise patient that Xray shows small calcium crystals in ankle joint, which is probably causing the swelling, pain and inflammation in ankle. Treatment is anti-inflammatory medications. Recommend naprosyn 500mg  one tablet twice a day for 14 days. Call if not much better when finished with naprosyn.

## 2014-12-24 NOTE — Telephone Encounter (Signed)
Patient advised and verbally voiced understanding. Prescription called into pharmacy.

## 2014-12-30 ENCOUNTER — Encounter: Payer: Self-pay | Admitting: Family Medicine

## 2014-12-30 ENCOUNTER — Telehealth: Payer: Self-pay | Admitting: Family Medicine

## 2014-12-30 ENCOUNTER — Ambulatory Visit (INDEPENDENT_AMBULATORY_CARE_PROVIDER_SITE_OTHER): Payer: Medicare PPO | Admitting: Family Medicine

## 2014-12-30 VITALS — BP 122/76 | HR 62 | Temp 98.7°F | Resp 16 | Wt 136.8 lb

## 2014-12-30 DIAGNOSIS — J45901 Unspecified asthma with (acute) exacerbation: Secondary | ICD-10-CM

## 2014-12-30 DIAGNOSIS — J0100 Acute maxillary sinusitis, unspecified: Secondary | ICD-10-CM | POA: Diagnosis not present

## 2014-12-30 DIAGNOSIS — Q8901 Asplenia (congenital): Secondary | ICD-10-CM

## 2014-12-30 MED ORDER — LEVOFLOXACIN 500 MG PO TABS: 500.0000 mg | ORAL_TABLET | Freq: Every day | ORAL | Status: DC

## 2014-12-30 NOTE — Progress Notes (Signed)
Subjective:    Patient ID: Heidi Hernandez, female    DOB: 05/18/1951, 64 y.o.   MRN: [id number]  HPI Cough onset 2 days ago that progressed to nasal congestion, rhinorrhea and sneezing. Some wheezing and history of asthma with allergies requiring allergy shots once a week. Frequently has recurrence of sinusitis and some bronchitis with asthma. Has a history of splenectomy due to ITP in 1990. Has 2 brother with histories of ITP, also. Contacted the [facility] yesterday and was given one Amoxicillin.  Past Medical History  Diagnosis Date  . Asthma     very mild  . MVP (mitral valve prolapse)   . Arthritis    Past Surgical History  Procedure Laterality Date  . Hernia repair      times 3  . Abdominal hysterectomy    . Splenectomy, total    . Appendectomy    . Cesarean section      times 2  . Shoulder arthroscopy with rotator cuff repair      times 2  . Ovarian cyst surgery    . Carpal tunnel release Bilateral 11/19/2014    Procedure: CARPAL TUNNEL RELEASE;  Surgeon: Donato HeinzJames P Hooten, MD;  Location: [facility] ORS;  Service: Orthopedics;  Laterality: Bilateral;  . Trigger finger release Right 11/19/2014    Procedure: RELEASE TRIGGER FINGER/A-1 PULLEY;  Surgeon: Donato HeinzJames P Hooten, MD;  Location: [facility] OR[facility];  Service: Orthopedics;  Laterality: Right;   History  Substance Use Topics  . Smoking status: Former Smoker    Quit date: 11/10/2014  . Smokeless tobacco: Not on file  . Alcohol Use: Yes     Comment: rare   Family History  Problem Relation Age of Onset  . Hypertension Mother   . Renal Disease Father     ESRD with HD   Current Outpatient Prescriptions on File Prior to Visit  Medication Sig Dispense Refill  . albuterol (VENTOLIN HFA) 108 (90 BASE) MCG/ACT inhaler Inhale into the lungs.    Marland Kitchen. alendronate (FOSAMAX) 70 MG tablet Take by mouth.    Marland Kitchen. atenolol (TENORMIN) 50 MG tablet Take 50 mg by mouth daily. pm    . azelastine (ASTELIN) 0.1 % nasal spray Place 2 sprays into both nostrils  2 (two) times daily. Use in each nostril as directed    . cetirizine (ZYRTEC) 10 MG tablet Take 10 mg by mouth daily. pm    . ciclesonide (ALVESCO) 80 MCG/ACT inhaler Inhale 1 puff into the lungs 2 (two) times daily.    . fluticasone (FLONASE) 50 MCG/ACT nasal spray     . montelukast (SINGULAIR) 10 MG tablet Take 10 mg by mouth at bedtime.    . naproxen (NAPROSYN) 500 MG tablet Take 1 tablet (500 mg total) by mouth 2 (two) times daily with a meal. 28 tablet 0  . pramipexole (MIRAPEX) 1 MG tablet Take 1 mg by mouth 2 (two) times daily.    . SUMAtriptan (IMITREX) 100 MG tablet Take 100 mg by mouth every 2 (two) hours as needed for migraine. May repeat in 2 hours if headache persists or recurs.    . traMADol (ULTRAM) 50 MG tablet Take 50 mg by mouth every 6 (six) hours as needed. 1 to two tabs    . Vitamin D, Ergocalciferol, (DRISDOL) 50000 UNITS CAPS capsule Take by mouth.    . zolpidem (AMBIEN) 10 MG tablet Take by mouth.     No current facility-administered medications on file prior to visit.   Allergies  Allergen  Reactions  . Droperidol Other (See Comments)    "Locked jaw" Facial muscles locked  . Morphine Itching and Nausea And Vomiting  . Morphine And Related Nausea And Vomiting  . Paxil [Paroxetine] Other (See Comments)    Passed out  . Paxil  [Paroxetine Hcl]     syncope      Review of Systems  Constitutional: Positive for fever and chills.       Low grade temperature elevation.  HENT: Positive for congestion, postnasal drip, rhinorrhea, sinus pressure, sneezing and voice change.   Eyes: Negative.   Respiratory: Positive for cough, chest tightness and wheezing.   Cardiovascular: Negative.   Gastrointestinal: Negative.   Genitourinary: Negative.   Musculoskeletal: Negative.       BP 122/76 mmHg  Pulse 62  Temp(Src) 98.7 F (37.1 C) (Oral)  Resp 16  Wt 136 lb 12.8 oz (62.052 kg)  SpO2 96%  Objective:   Physical Exam  Constitutional: She appears well-developed and  well-nourished.  HENT:  Head: Normocephalic and atraumatic.  Right Ear: External ear normal.  Left Ear: External ear normal.  Reddened nasal mucus membranes. No transillumination of maxillary sinuses. Some pressure tenderness in ethmoid region.  Eyes: Pupils are equal, round, and reactive to light.  Neck: Normal range of motion. Neck supple.  Cardiovascular: Normal rate, regular rhythm and normal heart sounds.   Pulmonary/Chest: Effort normal and breath sounds normal.  Abdominal: Soft. Bowel sounds are normal.  Lymphadenopathy:       Head (right side): No submandibular adenopathy present.       Head (left side): No submandibular adenopathy present.    She has no cervical adenopathy.    She has no axillary adenopathy.  Skin: Skin is warm and dry.      Assessment & Plan:  1. Acute maxillary sinusitis, recurrence not specified Sudden onset on 12-29-14. Has a history of allergies and takes Singulair, Zyrtec and Flonase with allergy shots once a week. With history of asplenia, will give Levaquin and recheck in 7-10 days. - levofloxacin (LEVAQUIN) 500 MG tablet; Take 1 tablet (500 mg total) by mouth daily.  Dispense: 7 tablet; Refill: 0  2. Asthmatic bronchitis, unspecified asthma severity, with acute exacerbation Recent flare with purulent sputum from cough. States she had a low grade fever (99+) at home. May use Mucinex-DM with Albuterol prn and Alvesco routinely with Singulair. Add Levaquin and encouraged to drink extra fluids. Recheck prn. - levofloxacin (LEVAQUIN) 500 MG tablet; Take 1 tablet (500 mg total) by mouth daily.  Dispense: 7 tablet; Refill: 0  3. Asplenia History of splenectomy in 1990 due to ITP. Immunization History  Administered Date(s) Administered  . Pneumococcal Polysaccharide-23 04/14/2009  . Tdap 05/17/2011

## 2015-01-07 ENCOUNTER — Telehealth: Payer: Self-pay | Admitting: Family Medicine

## 2015-01-07 DIAGNOSIS — J0100 Acute maxillary sinusitis, unspecified: Secondary | ICD-10-CM

## 2015-01-07 DIAGNOSIS — J45901 Unspecified asthma with (acute) exacerbation: Secondary | ICD-10-CM

## 2015-01-07 MED ORDER — LEVOFLOXACIN 500 MG PO TABS: 500.0000 mg | ORAL_TABLET | Freq: Every day | ORAL | Status: AC

## 2015-01-07 NOTE — Telephone Encounter (Signed)
Patient notified

## 2015-01-07 NOTE — Telephone Encounter (Signed)
Refill has been sent to [facility].

## 2015-01-07 NOTE — Telephone Encounter (Signed)
levofloxacin (LEVAQUIN) 500 MG tablet Pt called for a refill.  She said she is still not a lot better.  Her call back is [phone].  [facility]  Thanks, TP

## 2015-01-29 ENCOUNTER — Telehealth: Payer: Self-pay | Admitting: Family Medicine

## 2015-01-29 DIAGNOSIS — M1120 Other chondrocalcinosis, unspecified site: Secondary | ICD-10-CM

## 2015-01-29 DIAGNOSIS — I517 Cardiomegaly: Secondary | ICD-10-CM

## 2015-01-29 DIAGNOSIS — M11272 Other chondrocalcinosis, left ankle and foot: Secondary | ICD-10-CM

## 2015-01-29 NOTE — Telephone Encounter (Signed)
If still not improved, then need referral to orthopedics for chondrocalcinosis of ankle. Please advise and forward message to sarah.

## 2015-01-29 NOTE — Telephone Encounter (Signed)
Pt called finished medication and her ankle is still swollen and hurting.  Please advise.  Thanks Barth Kirks

## 2015-01-30 MED ORDER — TRAMADOL HCL 50 MG PO TABS: ORAL_TABLET | ORAL | Status: DC

## 2015-01-30 NOTE — Telephone Encounter (Signed)
She wants something for pain in the meantime.  [facility]

## 2015-01-30 NOTE — Telephone Encounter (Signed)
Patient would like to start Tramadol. Please provide sig for me to call in. Thanks!

## 2015-01-30 NOTE — Telephone Encounter (Signed)
Rx phoned into pharmacy.

## 2015-01-30 NOTE — Telephone Encounter (Signed)
See below about request for medication please-aa

## 2015-01-30 NOTE — Telephone Encounter (Signed)
LMTCB  aa 

## 2015-01-30 NOTE — Telephone Encounter (Signed)
We can either call in refill for tramadol, or she can pick up prescription for hydrocodone, whichever she would prefer.

## 2015-01-30 NOTE — Telephone Encounter (Signed)
Please call in tramadol  1-2 every six hours as needed, rf x 1.

## 2015-01-30 NOTE — Telephone Encounter (Signed)
She says she will see an orthopedic .  She has seen Dr, Ernest Pine.  She has [employer] and [place].  She just had carpal tunnel surgery with him.

## 2015-02-27 ENCOUNTER — Other Ambulatory Visit: Payer: Self-pay | Admitting: Family Medicine

## 2015-02-27 MED ORDER — ATENOLOL 50 MG PO TABS: 50.0000 mg | ORAL_TABLET | Freq: Every day | ORAL | Status: DC

## 2015-04-14 ENCOUNTER — Other Ambulatory Visit: Payer: Self-pay | Admitting: Family Medicine

## 2015-06-08 ENCOUNTER — Ambulatory Visit (INDEPENDENT_AMBULATORY_CARE_PROVIDER_SITE_OTHER): Payer: Medicare PPO | Admitting: Family Medicine

## 2015-06-08 ENCOUNTER — Encounter: Payer: Self-pay | Admitting: Family Medicine

## 2015-06-08 VITALS — BP 118/70 | HR 56 | Temp 98.5°F | Resp 18 | Ht 64.0 in | Wt 139.0 lb

## 2015-06-08 DIAGNOSIS — J329 Chronic sinusitis, unspecified: Secondary | ICD-10-CM | POA: Diagnosis not present

## 2015-06-08 DIAGNOSIS — Q8901 Asplenia (congenital): Secondary | ICD-10-CM | POA: Diagnosis not present

## 2015-06-08 DIAGNOSIS — G4700 Insomnia, unspecified: Secondary | ICD-10-CM | POA: Diagnosis not present

## 2015-06-08 DIAGNOSIS — R05 Cough: Secondary | ICD-10-CM | POA: Diagnosis not present

## 2015-06-08 DIAGNOSIS — R059 Cough, unspecified: Secondary | ICD-10-CM

## 2015-06-08 MED ORDER — LEVOFLOXACIN 750 MG PO TABS: 750.0000 mg | ORAL_TABLET | Freq: Every day | ORAL | Status: AC

## 2015-06-08 MED ORDER — HYDROCOD POLST-CPM POLST ER 10-8 MG/5ML PO SUER
5.0000 mL | Freq: Two times a day (BID) | ORAL | Status: DC | PRN
Start: 2015-06-08 — End: 2015-07-07

## 2015-06-08 MED ORDER — ZOLPIDEM TARTRATE 10 MG PO TABS: 5.0000 mg | ORAL_TABLET | Freq: Every evening | ORAL | Status: DC | PRN

## 2015-06-08 NOTE — Progress Notes (Signed)
Patient: Heidi Hernandez Female    DOB: 11/30/1950   64 y.o.   MRN: [id number] Visit Date: 06/08/2015  Today's Provider: [name], MD   Chief Complaint  Patient presents with  . URI   Subjective:    URI  This is a new problem. The current episode started yesterday. The problem has been gradually worsening. Maximum temperature: low grade 99. Associated symptoms include congestion, coughing (productive with green phlegm), headaches, neck pain, a plugged ear sensation, sinus pain, sneezing, a sore throat (mostly on right side), swollen glands and wheezing. Pertinent negatives include no abdominal pain, chest pain, diarrhea, dysuria, ear pain, joint pain, joint swelling, nausea, rash, rhinorrhea or vomiting. She has tried antihistamine for the symptoms. The treatment provided mild relief.   Insomnia follow up She has long history of insomnia and has done well with prn Ambien in the past. She requests refill for this. She usually only takes 1/2 of a  tablet which works fairly well.     Allergies  Allergen Reactions  . Droperidol Other (See Comments)    "Locked jaw" Facial muscles locked  . Morphine Itching and Nausea And Vomiting  . Morphine And Related Nausea And Vomiting  . Paxil [Paroxetine] Other (See Comments)    Passed out  . Paxil  [Paroxetine Hcl]     syncope   Previous Medications   ALBUTEROL (VENTOLIN HFA) 108 (90 BASE) MCG/ACT INHALER    Inhale into the lungs.   ALENDRONATE (FOSAMAX) 70 MG TABLET    Take 70 mg by mouth once a week.    ATENOLOL (TENORMIN) 50 MG TABLET    Take 1 tablet (50 mg total) by mouth daily. pm   AZELASTINE (ASTELIN) 0.1 % NASAL SPRAY    Place 2 sprays into both nostrils 2 (two) times daily. Use in each nostril as directed   CETIRIZINE (ZYRTEC) 10 MG TABLET    Take 10 mg by mouth daily. pm   CICLESONIDE (ALVESCO) 80 MCG/ACT INHALER    Inhale 1 puff into the lungs 2 (two) times daily.   FLUTICASONE (FLONASE) 50 MCG/ACT NASAL SPRAY     Place 2 sprays into both nostrils daily.    MONTELUKAST (SINGULAIR) 10 MG TABLET    Take 10 mg by mouth at bedtime.   PRAMIPEXOLE (MIRAPEX) 1 MG TABLET    Take 1 mg by mouth 2 (two) times daily.   SUMATRIPTAN (IMITREX) 100 MG TABLET    Take 100 mg by mouth every 2 (two) hours as needed for migraine. May repeat in 2 hours if headache persists or recurs.   TRAMADOL (ULTRAM) 50 MG TABLET    1-2 every six hours as needed for pain   VITAMIN D, ERGOCALCIFEROL, (DRISDOL) 50000 UNITS CAPS CAPSULE    Take 50,000 Units by mouth every 7 (seven) days.    ZOLPIDEM (AMBIEN) 10 MG TABLET    Take by mouth.    Review of Systems  Constitutional: Positive for fever (low grade) and chills. Negative for diaphoresis, appetite change and fatigue.  HENT: Positive for congestion, postnasal drip, sinus pressure, sneezing, sore throat (mostly on right side) and voice change. Negative for ear pain and rhinorrhea.   Eyes: Positive for pain.  Respiratory: Positive for cough (productive with green phlegm) and wheezing. Negative for chest tightness and shortness of breath.   Cardiovascular: Negative for chest pain and palpitations.  Gastrointestinal: Negative for nausea, vomiting, abdominal pain and diarrhea.  Genitourinary: Negative for dysuria.  Musculoskeletal:  Positive for neck pain. Negative for joint pain.  Skin: Negative for rash.  Neurological: Positive for headaches. Negative for dizziness and weakness.    Social History  Substance Use Topics  . Smoking status: Former Smoker    Quit date: 11/10/2014  . Smokeless tobacco: Not on file  . Alcohol Use: Yes     Comment: rare   Objective:   BP 118/70 mmHg  Pulse 56  Temp(Src) 98.5 F (36.9 C) (Oral)  Resp 18  Ht 5\' 4"  (1.626 m)  Wt 139 lb (63.05 kg)  BMI 23.85 kg/m2  SpO2 96%  Physical Exam  General Appearance:    Alert, cooperative, no distress  HENT:   bilateral TM normal without fluid or infection, neck has bilateral anterior cervical nodes  enlarged, frontal sinuses tender and nasal mucosa congested  Eyes:    PERRL, conjunctiva/corneas clear, EOM's intact       Lungs:     Clear to auscultation bilaterally, respirations unlabored  Heart:    Regular rate and rhythm  Neurologic:   Awake, alert, oriented x 3. No apparent focal neurological           defect.           Assessment & Plan:     1. Sinusitis, unspecified chronicity, unspecified location  - levofloxacin (LEVAQUIN) 750 MG tablet; Take 1 tablet (750 mg total) by mouth daily.  Dispense: 7 tablet; Refill: 0  2. Asplenia   3. Insomnia Counseled not to take in combination with other sedatives, including Tussiones below. She expresses understanding and states she will not take until her cough is better and stops Tussionex.  - zolpidem (AMBIEN) 10 MG tablet; Take 0.5-1 tablets (5-10 mg total) by mouth at bedtime as needed for sleep.  Dispense: 30 tablet; Refill: 2  4. Cough  - chlorpheniramine-HYDROcodone (TUSSIONEX PENNKINETIC ER) 10-8 MG/5ML SUER; Take 5 mLs by mouth every 12 (twelve) hours as needed for cough.  Dispense: 114 mL; Refill: 0       Mila Merryonald Fisher, MD  [facility]

## 2015-06-15 ENCOUNTER — Telehealth: Payer: Self-pay | Admitting: Family Medicine

## 2015-06-15 DIAGNOSIS — R059 Cough, unspecified: Secondary | ICD-10-CM

## 2015-06-15 DIAGNOSIS — R05 Cough: Secondary | ICD-10-CM

## 2015-06-15 NOTE — Telephone Encounter (Signed)
Needs chest Xray for further evaluation

## 2015-06-15 NOTE — Telephone Encounter (Signed)
Pt states she was in last Monday cough and congestion.  Pt states she has finished all medication and is not any better.  Pt is still coughing, has congestion and continues to have a fever.  [facility].  [phone]/[id number]#[phone]/MW

## 2015-06-15 NOTE — Telephone Encounter (Signed)
Unable to contact pt and unable to leave vm.will try again later.

## 2015-06-16 ENCOUNTER — Ambulatory Visit
Admission: RE | Admit: 2015-06-16 | Discharge: 2015-06-16 | Disposition: A | Discharge status: Home / Self Care | Payer: Medicare PPO | Source: Ambulatory Visit | Attending: Family Medicine | Admitting: Family Medicine

## 2015-06-16 ENCOUNTER — Telehealth: Payer: Self-pay

## 2015-06-16 DIAGNOSIS — J449 Chronic obstructive pulmonary disease, unspecified: Secondary | ICD-10-CM | POA: Diagnosis not present

## 2015-06-16 DIAGNOSIS — J189 Pneumonia, unspecified organism: Secondary | ICD-10-CM | POA: Diagnosis not present

## 2015-06-16 DIAGNOSIS — R059 Cough, unspecified: Secondary | ICD-10-CM

## 2015-06-16 DIAGNOSIS — R05 Cough: Secondary | ICD-10-CM

## 2015-06-16 MED ORDER — AMOXICILLIN-POT CLAVULANATE 875-125 MG PO TABS: 1.0000 | ORAL_TABLET | Freq: Two times a day (BID) | ORAL | Status: DC

## 2015-06-16 MED ORDER — PREDNISONE 10 MG (21) PO TBPK: 10.0000 mg | ORAL_TABLET | Freq: Every day | ORAL | Status: DC

## 2015-06-16 NOTE — Telephone Encounter (Signed)
-----   Message from Malva Limesonald E Fisher, MD sent at 06/16/2015  3:30 PM EST ----- Appears to be small area of pneumonia of left lung. Start Augmentin 875 BID x 10 days and 6 day prednisone taper . Call if not much better next week.

## 2015-06-16 NOTE — Telephone Encounter (Signed)
Patient was notified. X-ray was ordered.

## 2015-06-16 NOTE — Telephone Encounter (Signed)
Advised patient as below. Meds sent into pharmacy.

## 2015-06-16 NOTE — Telephone Encounter (Signed)
Pt called back wanting to know the status of her call yesterday.  I tried to triage call to the nurse but no one was at their phone.  I told her I would have them return her call.    Please call her back at [phone].  Thanks,teri

## 2015-06-16 NOTE — Telephone Encounter (Signed)
Left message to call back. Wanted to verify pharmacy before sending in medication. Thanks!

## 2015-06-17 ENCOUNTER — Other Ambulatory Visit: Payer: Self-pay | Admitting: *Deleted

## 2015-07-07 ENCOUNTER — Ambulatory Visit (INDEPENDENT_AMBULATORY_CARE_PROVIDER_SITE_OTHER): Payer: Medicare PPO | Admitting: Family Medicine

## 2015-07-07 ENCOUNTER — Encounter: Payer: Self-pay | Admitting: Family Medicine

## 2015-07-07 VITALS — BP 162/70 | HR 74 | Temp 99.2°F | Resp 20 | Wt 144.0 lb

## 2015-07-07 DIAGNOSIS — R05 Cough: Secondary | ICD-10-CM | POA: Diagnosis not present

## 2015-07-07 DIAGNOSIS — J40 Bronchitis, not specified as acute or chronic: Secondary | ICD-10-CM | POA: Diagnosis not present

## 2015-07-07 DIAGNOSIS — R059 Cough, unspecified: Secondary | ICD-10-CM

## 2015-07-07 MED ORDER — HYDROCOD POLST-CPM POLST ER 10-8 MG/5ML PO SUER: 5.0000 mL | Freq: Two times a day (BID) | ORAL | Status: DC | PRN

## 2015-07-07 MED ORDER — AMOXICILLIN-POT CLAVULANATE 875-125 MG PO TABS
1.0000 | ORAL_TABLET | Freq: Two times a day (BID) | ORAL | Status: DC
Start: 2015-07-07 — End: 2015-08-10

## 2015-07-07 MED ORDER — ALBUTEROL SULFATE HFA 108 (90 BASE) MCG/ACT IN AERS: 2.0000 | INHALATION_SPRAY | Freq: Four times a day (QID) | RESPIRATORY_TRACT | Status: DC | PRN

## 2015-07-07 NOTE — Progress Notes (Signed)
Patient: Heidi Hernandez Female    DOB: 11/29/1950   65 y.o.   MRN: [id number] Visit Date: 07/07/2015  Today's Provider: Mila Merryonald Fisher, MD   Chief Complaint  Patient presents with  . URI   Subjective:    URI  This is a new problem. Episode onset: 2 days. The problem has been gradually worsening. Maximum temperature: low grade at home. The fever has been present for less than 1 day. Associated symptoms include congestion, coughing (productive with green phlegm), ear pain, headaches, joint swelling (left ankle), a plugged ear sensation (left ear), rhinorrhea, sinus pain, sneezing, a sore throat, swollen glands and wheezing. Pertinent negatives include no abdominal pain, chest pain, diarrhea, dysuria, joint pain, nausea, neck pain, rash or vomiting. She has tried antihistamine (also using Flonase and cough syrup) for the symptoms. The treatment provided no relief.  Patient was last seen 06/08/2015 for sinuitis and cough. Patient was prescribed cough syrup and Levaquin. When she failed to improve, chest x ray was obtained on 06/16/15 showing pneumonia in the left lingula. Patient was then prescribed Prednisone and Augmentin. Patient states symptoms resolved except the cough. Patient continued to \\have  a lingering dry cough up until 2 days ago when cough became productive and new symptoms followed as mentioned above. Is having occasional wheezing in lungs, but is out of albuterol in haler.      Allergies  Allergen Reactions  . Droperidol Other (See Comments)    "Locked jaw" Facial muscles locked  . Morphine Itching and Nausea And Vomiting  . Morphine And Related Nausea And Vomiting  . Paxil [Paroxetine] Other (See Comments)    Passed out  . Paxil  [Paroxetine Hcl]     syncope   Previous Medications   ALBUTEROL (VENTOLIN HFA) 108 (90 BASE) MCG/ACT INHALER    Inhale into the lungs.   ALENDRONATE (FOSAMAX) 70 MG TABLET    Take 70 mg by mouth once a week.    ATENOLOL (TENORMIN) 50 MG  TABLET    Take 1 tablet (50 mg total) by mouth daily. pm   CETIRIZINE (ZYRTEC) 10 MG TABLET    Take 10 mg by mouth daily. pm   CHLORPHENIRAMINE-HYDROCODONE (TUSSIONEX PENNKINETIC ER) 10-8 MG/5ML SUER    Take 5 mLs by mouth every 12 (twelve) hours as needed for cough.   CICLESONIDE (ALVESCO) 80 MCG/ACT INHALER    Inhale 1 puff into the lungs 2 (two) times daily.   FLUTICASONE (FLONASE) 50 MCG/ACT NASAL SPRAY    Place 2 sprays into both nostrils daily.    MONTELUKAST (SINGULAIR) 10 MG TABLET    Take 10 mg by mouth at bedtime.   PRAMIPEXOLE (MIRAPEX) 1 MG TABLET    Take 1 mg by mouth 2 (two) times daily.   SUMATRIPTAN (IMITREX) 100 MG TABLET    Take 100 mg by mouth every 2 (two) hours as needed for migraine. May repeat in 2 hours if headache persists or recurs.   TRAMADOL (ULTRAM) 50 MG TABLET    1-2 every six hours as needed for pain   VITAMIN D, ERGOCALCIFEROL, (DRISDOL) 50000 UNITS CAPS CAPSULE    Take 50,000 Units by mouth every 7 (seven) days.    ZOLPIDEM (AMBIEN) 10 MG TABLET    Take 0.5-1 tablets (5-10 mg total) by mouth at bedtime as needed for sleep.    Review of Systems  Constitutional: Positive for fever, chills and fatigue. Negative for diaphoresis and appetite change.  HENT: Positive for congestion,  ear discharge, ear pain, postnasal drip, rhinorrhea, sneezing, sore throat and voice change. Negative for mouth sores and nosebleeds.   Respiratory: Positive for cough (productive with green phlegm), shortness of breath and wheezing. Negative for chest tightness.   Cardiovascular: Negative for chest pain and palpitations.  Gastrointestinal: Negative for nausea, vomiting, abdominal pain and diarrhea.  Genitourinary: Negative for dysuria.  Musculoskeletal: Negative for joint pain and neck pain.  Skin: Negative for rash.  Neurological: Positive for light-headedness and headaches. Negative for dizziness and weakness.    Social History  Substance Use Topics  . Smoking status: Former Smoker     Quit date: 11/10/2014  . Smokeless tobacco: Not on file  . Alcohol Use: No   Objective:   BP 162/70 mmHg  Pulse 74  Temp(Src) 99.2 F (37.3 C) (Oral)  Resp 20  Wt 144 lb (65.318 kg)  SpO2 98%  Physical Exam  General Appearance:    Alert, cooperative, no distress  HENT:   ENT exam normal, no neck nodes or sinus tenderness, neck without nodes, frontal and maxillary  sinuses tender and nasal mucosa congested  Eyes:    PERRL, conjunctiva/corneas clear, EOM's intact       Lungs:     Clear to auscultation bilaterally, respirations unlabored  Heart:    Regular rate and rhythm  Neurologic:   Awake, alert, oriented x 3. No apparent focal neurological           defect.           Assessment & Plan:     1. Cough  - chlorpheniramine-HYDROcodone (TUSSIONEX PENNKINETIC ER) 10-8 MG/5ML SUER; Take 5 mLs by mouth every 12 (twelve) hours as needed for cough.  Dispense: 114 mL; Refill: 0 - albuterol (VENTOLIN HFA) 108 (90 Base) MCG/ACT inhaler; Inhale 2 puffs into the lungs every 6 (six) hours as needed for wheezing. May substitute generic or equivalent brand name  Dispense: 18 g; Refill: 1  2. Bronchitis  - amoxicillin-clavulanate (AUGMENTIN) 875-125 MG tablet; Take 1 tablet by mouth 2 (two) times daily.  Dispense: 20 tablet; Refill: 0  Need to repeat chest XR when finished with abx to ensure clearing of pneumonia seen on XR last month.       [name], MD  [facility]

## 2015-07-16 ENCOUNTER — Telehealth: Payer: Self-pay | Admitting: Family Medicine

## 2015-07-16 ENCOUNTER — Ambulatory Visit
Admission: RE | Admit: 2015-07-16 | Discharge: 2015-07-16 | Disposition: A | Discharge status: Home / Self Care | Payer: Medicare PPO | Source: Ambulatory Visit | Attending: Family Medicine | Admitting: Family Medicine

## 2015-07-16 DIAGNOSIS — J189 Pneumonia, unspecified organism: Secondary | ICD-10-CM | POA: Diagnosis not present

## 2015-07-16 DIAGNOSIS — M8580 Other specified disorders of bone density and structure, unspecified site: Secondary | ICD-10-CM | POA: Insufficient documentation

## 2015-07-16 NOTE — Telephone Encounter (Signed)
Advised patient as below.  

## 2015-07-16 NOTE — Telephone Encounter (Signed)
Please let patient an order has been went to outpatient imaging center to repeat chest XR to make sure pneumonia has cleared.

## 2015-07-16 NOTE — Telephone Encounter (Signed)
-----   Message from Malva Limes, MD sent at 07/07/2015  2:03 PM EST ----- Regarding: FW: repeat Chest XR before 07-17-15   ----- Message -----    From: Malva Limes, MD    Sent: 07/07/2015   2:02 PM      To: Malva Limes, MD Subject: repeat Chest XR before 07-17-15                 Ensure clearing of pneumonia

## 2015-07-20 ENCOUNTER — Other Ambulatory Visit: Payer: Self-pay

## 2015-07-20 MED ORDER — PRAMIPEXOLE DIHYDROCHLORIDE 1 MG PO TABS: 1.0000 mg | ORAL_TABLET | Freq: Two times a day (BID) | ORAL | Status: DC

## 2015-07-20 NOTE — Telephone Encounter (Signed)
Pharmacy requesting refill.

## 2015-08-10 ENCOUNTER — Encounter: Payer: Self-pay | Admitting: *Deleted

## 2015-08-10 ENCOUNTER — Encounter: Payer: Self-pay | Admitting: Family Medicine

## 2015-08-10 ENCOUNTER — Ambulatory Visit (INDEPENDENT_AMBULATORY_CARE_PROVIDER_SITE_OTHER): Payer: Medicare PPO | Admitting: Family Medicine

## 2015-08-10 ENCOUNTER — Other Ambulatory Visit: Payer: Medicare PPO

## 2015-08-10 VITALS — BP 124/78 | HR 65 | Temp 98.1°F | Resp 16 | Wt 144.0 lb

## 2015-08-10 DIAGNOSIS — J0110 Acute frontal sinusitis, unspecified: Secondary | ICD-10-CM

## 2015-08-10 MED ORDER — AMOXICILLIN-POT CLAVULANATE 875-125 MG PO TABS: 1.0000 | ORAL_TABLET | Freq: Two times a day (BID) | ORAL | Status: AC

## 2015-08-10 NOTE — Patient Instructions (Addendum)
  Your procedure is scheduled on: 08-12-15 [facility]) Report to [facility] SAME DAY SURGERY 2ND FLOOR To find out your arrival time please call [phone] between 1PM - 3PM on 08-11-15 (TUESDAY).  Remember: Instructions that are not followed completely may result in serious medical risk, up to and including death, or upon the discretion of your surgeon and anesthesiologist your surgery may need to be rescheduled.    _X___ 1. Do not eat food or drink liquids after midnight. No gum chewing or hard candies.     _X___ 2. No Alcohol for 24 hours before or after surgery.   ____ 3. Bring all medications with you on the day of surgery if instructed.    _X___ 4. Notify your doctor if there is any change in your medical condition     (cold, fever, infections).     Do not wear jewelry, make-up, hairpins, clips or nail polish.  Do not wear lotions, powders, or perfumes. You may wear deodorant.  Do not shave 48 hours prior to surgery. Men may shave face and neck.  Do not bring valuables to the hospital.    [facility] is not responsible for any belongings or valuables.               Contacts, dentures or bridgework may not be worn into surgery.  Leave your suitcase in the car. After surgery it may be brought to your room.  For patients admitted to the hospital, discharge time is determined by your treatment team.   Patients discharged the day of surgery will not be allowed to drive home.   Please read over the following fact sheets that you were given:     _X___ Take these medicines the morning of surgery with A SIP OF WATER:    1. ATENOLOL  2. MIRAPEX  3.   4.  5.  6.  ____ Fleet Enema (as directed)   _X___ Use CHG Soap as directed  _X___ Use inhalers on the day of surgery-USE QVAR AND  ALBUTEROL INHALER AT HOME AND BRING ALBUTEROL INHALER TO HOSPITAL  ____ Stop metformin 2 days prior to surgery    ____ Take 1/2 of usual insulin dose the night before surgery and none on the  morning of surgery.   ____ Stop Coumadin/Plavix/aspirin-N/A  ____ Stop Anti-inflammatories-NO NSAIDS OR ASPIRIN PRODUCTS-TYLENOL OK TO TAKE   ____ Stop supplements until after surgery.    ____ Bring C-Pap to the hospital.

## 2015-08-10 NOTE — Progress Notes (Signed)
Patient: Heidi Hernandez Female    DOB: 11/06/1950   65 y.o.   MRN: [id number] Visit Date: 08/10/2015  Today's Provider: [name], MD   Chief Complaint  Patient presents with  . URI   Subjective:    URI  This is a new problem. Episode onset: started last night. The problem has been unchanged. There has been no fever (temperature was 99.9 last night). Associated symptoms include congestion, coughing, ear pain, joint swelling (left ankle), neck pain, sneezing, a sore throat (scratchy throat) and wheezing. Pertinent negatives include no abdominal pain, chest pain, diarrhea, dysuria, joint pain, nausea, rhinorrhea, sinus pain or vomiting. She has tried antihistamine for the symptoms. The treatment provided no relief.  Patient also has thick yellow/ green phlegm. Scheduled for repair trigger finger on 08/12/15.     Allergies  Allergen Reactions  . Droperidol Other (See Comments)    "Locked jaw" Facial muscles locked  . Morphine Itching and Nausea And Vomiting  . Morphine And Related Nausea And Vomiting  . Paxil [Paroxetine] Other (See Comments)    Passed out  . Paxil  [Paroxetine Hcl]     syncope   Previous Medications   ALBUTEROL (VENTOLIN HFA) 108 (90 BASE) MCG/ACT INHALER    Inhale 2 puffs into the lungs every 6 (six) hours as needed for wheezing. May substitute generic or equivalent brand name   ALENDRONATE (FOSAMAX) 70 MG TABLET    Take 70 mg by mouth once a week.    ATENOLOL (TENORMIN) 50 MG TABLET    Take 1 tablet (50 mg total) by mouth daily. pm   CETIRIZINE (ZYRTEC) 10 MG TABLET    Take 10 mg by mouth daily. pm   FLUTICASONE (FLONASE) 50 MCG/ACT NASAL SPRAY    Place 2 sprays into both nostrils daily.    MONTELUKAST (SINGULAIR) 10 MG TABLET    Take 10 mg by mouth at bedtime.   PRAMIPEXOLE (MIRAPEX) 1 MG TABLET    Take 1 tablet (1 mg total) by mouth 2 (two) times daily.   QVAR 40 MCG/ACT INHALER    Inhale 2 puffs into the lungs 2 (two) times daily.   SUMATRIPTAN  (IMITREX) 100 MG TABLET    Take 100 mg by mouth every 2 (two) hours as needed for migraine. May repeat in 2 hours if headache persists or recurs.   TRAMADOL (ULTRAM) 50 MG TABLET    1-2 every six hours as needed for pain   VITAMIN D, ERGOCALCIFEROL, (DRISDOL) 50000 UNITS CAPS CAPSULE    Take 50,000 Units by mouth every 7 (seven) days.    ZOLPIDEM (AMBIEN) 10 MG TABLET    Take 0.5-1 tablets (5-10 mg total) by mouth at bedtime as needed for sleep.    Review of Systems  Constitutional: Positive for fatigue. Negative for fever, chills and appetite change.  HENT: Positive for congestion, ear pain, mouth sores (sore on tongue), postnasal drip, sinus pressure, sneezing and sore throat (scratchy throat). Negative for nosebleeds and rhinorrhea.   Respiratory: Positive for cough and wheezing. Negative for chest tightness and shortness of breath.   Cardiovascular: Negative for chest pain and palpitations.  Gastrointestinal: Negative for nausea, vomiting, abdominal pain and diarrhea.  Genitourinary: Negative for dysuria.  Musculoskeletal: Positive for neck pain. Negative for joint pain.  Neurological: Negative for dizziness and weakness.    Social History  Substance Use Topics  . Smoking status: Former Smoker    Quit date: 11/10/2014  . Smokeless tobacco:  Not on file  . Alcohol Use: No   Objective:   BP 124/78 mmHg  Pulse 65  Temp(Src) 98.1 F (36.7 C) (Oral)  Resp 16  Wt 144 lb (65.318 kg)  SpO2 97%  Physical Exam  General Appearance:    Alert, cooperative, no distress  HENT:   bilateral TM normal without fluid or infection, neck without nodes, throat normal without erythema or exudate, frontal sinus tender and nasal mucosa congested  Eyes:    PERRL, conjunctiva/corneas clear, EOM's intact       Lungs:     Clear to auscultation bilaterally, respirations unlabored  Heart:    Regular rate and rhythm  Neurologic:   Awake, alert, oriented x 3. No apparent focal neurological           defect.            Assessment & Plan:     1. Acute frontal sinusitis, recurrence not specified  - amoxicillin-clavulanate (AUGMENTIN) 875-125 MG tablet; Take 1 tablet by mouth 2 (two) times daily.  Dispense: 14 tablet; Refill: 0        [name], MD  [facility]

## 2015-08-11 ENCOUNTER — Encounter
Admission: RE | Admit: 2015-08-11 | Discharge: 2015-08-11 | Disposition: A | Discharge status: Home / Self Care | Payer: Medicare PPO | Source: Ambulatory Visit | Attending: Orthopedic Surgery | Admitting: Orthopedic Surgery

## 2015-08-11 DIAGNOSIS — M65331 Trigger finger, right middle finger: Secondary | ICD-10-CM | POA: Diagnosis not present

## 2015-08-11 DIAGNOSIS — Z885 Allergy status to narcotic agent status: Secondary | ICD-10-CM | POA: Diagnosis not present

## 2015-08-11 DIAGNOSIS — I341 Nonrheumatic mitral (valve) prolapse: Secondary | ICD-10-CM | POA: Diagnosis not present

## 2015-08-11 DIAGNOSIS — Z90710 Acquired absence of both cervix and uterus: Secondary | ICD-10-CM | POA: Diagnosis not present

## 2015-08-11 DIAGNOSIS — Z9081 Acquired absence of spleen: Secondary | ICD-10-CM | POA: Diagnosis not present

## 2015-08-11 DIAGNOSIS — G43909 Migraine, unspecified, not intractable, without status migrainosus: Secondary | ICD-10-CM | POA: Diagnosis not present

## 2015-08-11 DIAGNOSIS — Z87891 Personal history of nicotine dependence: Secondary | ICD-10-CM | POA: Diagnosis not present

## 2015-08-11 DIAGNOSIS — Z79899 Other long term (current) drug therapy: Secondary | ICD-10-CM | POA: Diagnosis not present

## 2015-08-11 DIAGNOSIS — D693 Immune thrombocytopenic purpura: Secondary | ICD-10-CM | POA: Diagnosis not present

## 2015-08-11 DIAGNOSIS — Z9889 Other specified postprocedural states: Secondary | ICD-10-CM | POA: Diagnosis not present

## 2015-08-11 DIAGNOSIS — Z9849 Cataract extraction status, unspecified eye: Secondary | ICD-10-CM | POA: Diagnosis not present

## 2015-08-11 DIAGNOSIS — Z79891 Long term (current) use of opiate analgesic: Secondary | ICD-10-CM | POA: Diagnosis not present

## 2015-08-11 DIAGNOSIS — Z888 Allergy status to other drugs, medicaments and biological substances status: Secondary | ICD-10-CM | POA: Diagnosis not present

## 2015-08-11 DIAGNOSIS — Z7951 Long term (current) use of inhaled steroids: Secondary | ICD-10-CM | POA: Diagnosis not present

## 2015-08-11 DIAGNOSIS — I10 Essential (primary) hypertension: Secondary | ICD-10-CM | POA: Diagnosis not present

## 2015-08-11 LAB — CBC
HCT: 40.9 % (ref 35.0–47.0)
HEMOGLOBIN: 14.2 g/dL (ref 12.0–16.0)
MCH: 32.4 pg (ref 26.0–34.0)
MCHC: 34.6 g/dL (ref 32.0–36.0)
MCV: 93.5 fL (ref 80.0–100.0)
Platelets: 148 10*3/uL — ABNORMAL LOW (ref 150–440)
RBC: 4.37 MIL/uL (ref 3.80–5.20)
RDW: 14.1 % (ref 11.5–14.5)
WBC: 6.9 10*3/uL (ref 3.6–11.0)

## 2015-08-11 NOTE — Pre-Procedure Instructions (Signed)
EKG NOTED AND ANTERIOR INFARCT NOTED ON 2007 EKG

## 2015-08-12 ENCOUNTER — Encounter
Admission: RE | Disposition: A | Discharge status: Home / Self Care | Payer: Self-pay | Source: Ambulatory Visit | Attending: Orthopedic Surgery

## 2015-08-12 ENCOUNTER — Ambulatory Visit
Admission: RE | Admit: 2015-08-12 | Discharge: 2015-08-12 | Disposition: A | Discharge status: Home / Self Care | Payer: Medicare PPO | Source: Ambulatory Visit | Attending: Orthopedic Surgery | Admitting: Orthopedic Surgery

## 2015-08-12 ENCOUNTER — Ambulatory Visit: Payer: Medicare PPO | Admitting: Anesthesiology

## 2015-08-12 ENCOUNTER — Encounter: Payer: Self-pay | Admitting: Orthopedic Surgery

## 2015-08-12 DIAGNOSIS — M65331 Trigger finger, right middle finger: Secondary | ICD-10-CM | POA: Diagnosis not present

## 2015-08-12 DIAGNOSIS — Z79899 Other long term (current) drug therapy: Secondary | ICD-10-CM | POA: Insufficient documentation

## 2015-08-12 DIAGNOSIS — I10 Essential (primary) hypertension: Secondary | ICD-10-CM | POA: Insufficient documentation

## 2015-08-12 DIAGNOSIS — G43909 Migraine, unspecified, not intractable, without status migrainosus: Secondary | ICD-10-CM | POA: Insufficient documentation

## 2015-08-12 DIAGNOSIS — Z79891 Long term (current) use of opiate analgesic: Secondary | ICD-10-CM | POA: Insufficient documentation

## 2015-08-12 DIAGNOSIS — Z7951 Long term (current) use of inhaled steroids: Secondary | ICD-10-CM | POA: Insufficient documentation

## 2015-08-12 DIAGNOSIS — Z87891 Personal history of nicotine dependence: Secondary | ICD-10-CM | POA: Insufficient documentation

## 2015-08-12 DIAGNOSIS — Z888 Allergy status to other drugs, medicaments and biological substances status: Secondary | ICD-10-CM | POA: Insufficient documentation

## 2015-08-12 DIAGNOSIS — Z9081 Acquired absence of spleen: Secondary | ICD-10-CM | POA: Insufficient documentation

## 2015-08-12 DIAGNOSIS — Z9849 Cataract extraction status, unspecified eye: Secondary | ICD-10-CM | POA: Insufficient documentation

## 2015-08-12 DIAGNOSIS — Z90710 Acquired absence of both cervix and uterus: Secondary | ICD-10-CM | POA: Insufficient documentation

## 2015-08-12 DIAGNOSIS — Z885 Allergy status to narcotic agent status: Secondary | ICD-10-CM | POA: Insufficient documentation

## 2015-08-12 DIAGNOSIS — Z9889 Other specified postprocedural states: Secondary | ICD-10-CM | POA: Insufficient documentation

## 2015-08-12 DIAGNOSIS — D693 Immune thrombocytopenic purpura: Secondary | ICD-10-CM | POA: Insufficient documentation

## 2015-08-12 DIAGNOSIS — I341 Nonrheumatic mitral (valve) prolapse: Secondary | ICD-10-CM | POA: Insufficient documentation

## 2015-08-12 HISTORY — PX: TRIGGER FINGER RELEASE: SHX641

## 2015-08-12 HISTORY — DX: Immune thrombocytopenic purpura: D69.3

## 2015-08-12 HISTORY — DX: Pneumonia, unspecified organism: J18.9

## 2015-08-12 SURGERY — RELEASE, A1 PULLEY, FOR TRIGGER FINGER
Anesthesia: Choice | Site: Hand | Laterality: Right | Wound class: Clean

## 2015-08-12 MED ORDER — ACETAMINOPHEN 10 MG/ML IV SOLN
INTRAVENOUS | Status: AC
  Filled 2015-08-12: qty 100

## 2015-08-12 MED ORDER — EPHEDRINE SULFATE 50 MG/ML IJ SOLN
INTRAMUSCULAR | Status: DC | PRN
Start: 2015-08-12 — End: 2015-08-12
  Administered 2015-08-12: 5 mg via INTRAVENOUS
  Administered 2015-08-12: 10 mg via INTRAVENOUS

## 2015-08-12 MED ORDER — FAMOTIDINE 20 MG PO TABS
ORAL_TABLET | ORAL | Status: AC
  Administered 2015-08-12: 20 mg via ORAL
  Filled 2015-08-12: qty 1

## 2015-08-12 MED ORDER — LACTATED RINGERS IV SOLN
INTRAVENOUS | Status: DC
  Administered 2015-08-12 (×2): via INTRAVENOUS

## 2015-08-12 MED ORDER — LIDOCAINE HCL (CARDIAC) 20 MG/ML IV SOLN
INTRAVENOUS | Status: DC | PRN
  Administered 2015-08-12: 30 mg via INTRAVENOUS

## 2015-08-12 MED ORDER — HYDROCODONE-ACETAMINOPHEN 5-325 MG PO TABS
1.0000 | ORAL_TABLET | ORAL | Status: DC | PRN
Start: 2015-08-12 — End: 2016-01-14

## 2015-08-12 MED ORDER — PROPOFOL 10 MG/ML IV BOLUS
INTRAVENOUS | Status: DC | PRN
  Administered 2015-08-12: 150 mg via INTRAVENOUS
  Administered 2015-08-12: 20 mg via INTRAVENOUS

## 2015-08-12 MED ORDER — BUPIVACAINE HCL (PF) 0.25 % IJ SOLN
INTRAMUSCULAR | Status: AC
  Filled 2015-08-12: qty 30

## 2015-08-12 MED ORDER — PROPOFOL 500 MG/50ML IV EMUL: INTRAVENOUS | Status: DC | PRN

## 2015-08-12 MED ORDER — LIDOCAINE HCL (PF) 0.5 % IJ SOLN
INTRAMUSCULAR | Status: AC
  Filled 2015-08-12: qty 50

## 2015-08-12 MED ORDER — BUPIVACAINE-EPINEPHRINE (PF) 0.25% -1:200000 IJ SOLN
INTRAMUSCULAR | Status: AC
  Filled 2015-08-12: qty 30

## 2015-08-12 MED ORDER — MIDAZOLAM HCL 5 MG/5ML IJ SOLN
INTRAMUSCULAR | Status: DC | PRN
  Administered 2015-08-12 (×2): 1 mg via INTRAVENOUS

## 2015-08-12 MED ORDER — DEXTROSE 5 % IV SOLN
2000.0000 mg | Freq: Once | INTRAVENOUS | Status: DC
Start: 2015-08-12 — End: 2015-08-12
  Filled 2015-08-12: qty 20

## 2015-08-12 MED ORDER — BUPIVACAINE HCL 0.25 % IJ SOLN
INTRAMUSCULAR | Status: DC | PRN
  Administered 2015-08-12: 7 mL

## 2015-08-12 MED ORDER — FAMOTIDINE 20 MG PO TABS
20.0000 mg | ORAL_TABLET | Freq: Once | ORAL | Status: AC
  Administered 2015-08-12: 20 mg via ORAL

## 2015-08-12 MED ORDER — FENTANYL CITRATE (PF) 100 MCG/2ML IJ SOLN
INTRAMUSCULAR | Status: DC | PRN
  Administered 2015-08-12 (×2): 50 ug via INTRAVENOUS

## 2015-08-12 MED ORDER — CEFAZOLIN SODIUM-DEXTROSE 2-3 GM-% IV SOLR
INTRAVENOUS | Status: DC | PRN
  Administered 2015-08-12: 2 g via INTRAVENOUS

## 2015-08-12 SURGICAL SUPPLY — 23 items
BNDG COHESIVE 4X5 TAN STRL (GAUZE/BANDAGES/DRESSINGS) ×2 IMPLANT
BNDG ESMARK 4X12 TAN STRL LF (GAUZE/BANDAGES/DRESSINGS) ×2 IMPLANT
DRSG DERMACEA 8X12 NADH (GAUZE/BANDAGES/DRESSINGS) ×2 IMPLANT
DURAPREP 26ML APPLICATOR (WOUND CARE) ×2 IMPLANT
GAUZE SPONGE 4X4 12PLY STRL (GAUZE/BANDAGES/DRESSINGS) ×2 IMPLANT
GAUZE STRETCH 2X75IN STRL (MISCELLANEOUS) ×2 IMPLANT
GLOVE BIO SURGEON STRL SZ8 (GLOVE) ×2 IMPLANT
GLOVE BIOGEL M STRL SZ7.5 (GLOVE) ×2 IMPLANT
GLOVE INDICATOR 8.0 STRL GRN (GLOVE) ×2 IMPLANT
GOWN STRL REUS W/ TWL LRG LVL3 (GOWN DISPOSABLE) ×2 IMPLANT
GOWN STRL REUS W/TWL LRG LVL3 (GOWN DISPOSABLE) ×2
KIT RM TURNOVER STRD PROC AR (KITS) ×2 IMPLANT
NDL SAFETY 25GX1.5 (NEEDLE) ×2 IMPLANT
NS IRRIG 500ML POUR BTL (IV SOLUTION) ×2 IMPLANT
PACK EXTREMITY ARMC (MISCELLANEOUS) ×2 IMPLANT
PAD GROUND ADULT SPLIT (MISCELLANEOUS) ×2 IMPLANT
STOCKINETTE STRL 4IN 9604848 (GAUZE/BANDAGES/DRESSINGS) ×2 IMPLANT
SUT ETHILON 5-0 FS-2 18 BLK (SUTURE) ×2 IMPLANT
SUT PROLENE 4-0 (SUTURE) ×1
SUT PROLENE 4-0 RB1 30XMFL BLU (SUTURE) ×1
SUT PROLENE 6 0 CC 1 (SUTURE) ×2 IMPLANT
SUTURE PROLEN 4-0 RB1 30XMFL (SUTURE) ×1 IMPLANT
SYRINGE 10CC LL (SYRINGE) ×4 IMPLANT

## 2015-08-12 NOTE — H&P (Signed)
The patient has been re-examined, and the chart reviewed, and there have been no interval changes to the documented history and physical.    The risks, benefits, and alternatives have been discussed at length. The patient expressed understanding of the risks benefits and agreed with plans for surgical intervention.  Ermin Parisien P. Sekai Nayak, Jr. M.D.    

## 2015-08-12 NOTE — Transfer of Care (Signed)
Immediate Anesthesia Transfer of Care Note  Patient: Heidi Hernandez  Procedure(s) Performed: Procedure(s): RELEASE TRIGGER LONG FINGER (Right)  Patient Location: PACU  Anesthesia Type:General  Level of Consciousness: awake and patient cooperative  Airway & Oxygen Therapy: Patient Spontanous Breathing and Patient connected to face mask oxygen  Post-op Assessment: Report given to RN and Post -op Vital signs reviewed and stable  Post vital signs: stable  Last Vitals:  Filed Vitals:   08/12/15 1220 08/12/15 1421  BP: 148/77 94/51  Pulse: 57 59  Temp: 36.6 C 36.2 C  Resp: 16 22    Complications: No apparent anesthesia complications

## 2015-08-12 NOTE — Op Note (Signed)
OPERATIVE NOTE  DATE OF SURGERY:  08/12/2015  PATIENT NAME:  Heidi Hernandez   DOB: 1951/06/24   MRN: [id number]  PRE-OPERATIVE DIAGNOSIS: Right long trigger finger (stenosing tenosynovitis)  POST-OPERATIVE DIAGNOSIS:  Same  PROCEDURE:  Right long trigger finger release  SURGEON:  Jena Gauss. M.D.  ANESTHESIA: general  ESTIMATED BLOOD LOSS: 1 mL  FLUIDS REPLACED: 500 mL of crystalloid  TOURNIQUET TIME: 17 minutes  DRAINS: None  INDICATIONS FOR SURGERY: [name] is a 65 y.o. year old female with a history of triggering to the right long finger as well as pain along the A1 pulley consistent with stenosing tenosynovitis. The patient had not seen any significant improvement despite conservative nonsurgical intervention. After discussion of the risks and benefits of surgical intervention, the patient expressed understanding of the risks benefits and agree with plans for right long trigger finger release.   PROCEDURE IN DETAIL: The patient was brought into the operating room and after adequate general anesthesia, a tourniquet was placed on the patient's right upper arm.The right hand and arm were prepped with alcohol and Duraprep and draped in the usual sterile fashion. A "time-out" was performed as per usual protocol. The hand and forearm were exsanguinated using an Esmarch and the tourniquet was inflated to 250 mmHg. Loupe magnification was used throughout the procedure. A transverse incision was made at the level of the A1 pulley of the left long ray. Dissection was carried down to the tendon sheath. The leading edge of the A1 pulley of the long finger was identified and the A1 pulley was sharply incised using tenotomy scissors. Complete release of the A1 pulley was achieved in this fashion. A moderate amount of tenosynovial fluid was expressed. The long fingers placed through a passive range of motion with good excursion of the tendon. The wound was irrigated with copious  amounts of normal saline with MRI solution. Skin edges were reapproximated using #5-0 nylon. 7 mL of 0.25% Marcaine was injected along the incision site. a sterile dressing was applied. The tourniquet was deflated with a total tourniquet time of 17 minutes.  The patient tolerated the procedure well and was transported to the PACU in stable condition.  Gracin Mcpartland P. Angie Fava., M.D.

## 2015-08-12 NOTE — Discharge Instructions (Signed)
°  Instructions after Hand / Wrist Surgery ° ° James P. Hooten, Jr., M.D. ° Dept. of Orthopaedics & Sports Medicine ° [facility] ° [address] ° [place], [place]  [address] ° ° Phone: [phone]   Fax: [phone] ° ° °DIET: °• Drink plenty of non-alcoholic fluids & begin a light diet. °• Resume your normal diet the day after surgery. ° °ACTIVITY:  °• Keep the hand elevated above the level of the elbow. °• Begin gently moving the fingers on a regular basis to avoid stiffness. °• Avoid any heavy lifting, pushing, or pulling with the operative hand. °• Do not drive or operate any equipment until instructed. ° °WOUND CARE:  °• Keep the splint/bandage clean and dry.  °• The splint and stitches will be removed in the office. °• Continue to use the ice packs periodically to reduce pain and swelling. °• You may bathe or shower after the stitches are removed at the first office visit following surgery. ° °MEDICATIONS: °• You may resume your regular medications. °• Please take the pain medication as prescribed. °• Do not take pain medication on an empty stomach. °• Do not drive or drink alcoholic beverages when taking pain medications. ° °CALL THE OFFICE FOR: °• Temperature above 101 degrees °• Excessive bleeding or drainage on the dressing. °• Excessive swelling, coldness, or paleness of the fingers. °• Persistent nausea and vomiting. ° °FOLLOW-UP:  °• You should have an appointment to return to the office in 7-10 days after surgery.  ° °REMEMBER: R.I.C.E. = Rest, Ice, Compression, Elevation !  ° ° ° °AMBULATORY SURGERY  °DISCHARGE INSTRUCTIONS ° ° °1) The drugs that you were given will stay in your system until tomorrow so for the next 24 hours you should not: ° °A) Drive an automobile °B) Make any legal decisions °C) Drink any alcoholic beverage ° ° °2) You may resume regular meals tomorrow.  Today it is better to start with liquids and gradually work up to solid foods. ° °You may eat anything you prefer, but  it is better to start with liquids, then soup and crackers, and gradually work up to solid foods. ° ° °3) Please notify your doctor immediately if you have any unusual bleeding, trouble breathing, redness and pain at the surgery site, drainage, fever, or pain not relieved by medication. ° ° ° °4) Additional Instructions: ° ° ° ° ° ° ° °Please contact your physician with any problems or Same Day Surgery at [phone], Monday through Friday 6 am to 4 pm, or [facility] [place] Main number at [phone]. °

## 2015-08-12 NOTE — Brief Op Note (Signed)
08/12/2015  2:24 PM  PATIENT:  Heidi Hernandez  65 y.o. female  PRE-OPERATIVE DIAGNOSIS:  right long finger trigger  POST-OPERATIVE DIAGNOSIS:  right long finger trigger  PROCEDURE:  Procedure(s): RELEASE TRIGGER LONG FINGER (Right)  SURGEON:  Surgeon(s) and Role:    * Donato Heinz, MD - Primary  ASSISTANTS: none   ANESTHESIA:   general  EBL:  Total I/O In: 500 [I.V.:500] Out: 1 [Blood:1]  BLOOD ADMINISTERED:none  DRAINS: none   LOCAL MEDICATIONS USED:  MARCAINE     SPECIMEN:  No Specimen  DISPOSITION OF SPECIMEN:  N/A  COUNTS:  YES  TOURNIQUET:   17 minutes  DICTATION: .Dragon Dictation  PLAN OF CARE: Discharge to home after PACU  PATIENT DISPOSITION:  PACU - hemodynamically stable.   Delay start of Pharmacological VTE agent (>24hrs) due to surgical blood loss or risk of bleeding: not applicable

## 2015-08-12 NOTE — Anesthesia Procedure Notes (Signed)
Procedure Name: LMA Insertion Date/Time: 08/12/2015 1:37 PM Performed by: Charna Busman Pre-anesthesia Checklist: Patient identified, Emergency Drugs available, Suction available, Patient being monitored and Timeout performed Patient Re-evaluated:Patient Re-evaluated prior to inductionIntubation Type: IV induction and Combination inhalational/ intravenous induction Ventilation: Mask ventilation without difficulty LMA: LMA inserted LMA Size: 3.0 Placement Confirmation: positive ETCO2,  CO2 detector and breath sounds checked- equal and bilateral Tube secured with: Tape Dental Injury: Teeth and Oropharynx as per pre-operative assessment

## 2015-08-13 ENCOUNTER — Encounter: Payer: Self-pay | Admitting: Orthopedic Surgery

## 2015-08-13 NOTE — Anesthesia Preprocedure Evaluation (Signed)
Anesthesia Evaluation  Patient identified by MRN, date of birth, ID band Patient awake    Reviewed: Allergy & Precautions, H&P , NPO status , Patient's Chart, lab work & pertinent test results, reviewed documented beta blocker date and time   Airway Mallampati: II   Neck ROM: full    Dental  (+) Poor Dentition   Pulmonary neg pulmonary ROS, asthma , pneumonia, former smoker,    Pulmonary exam normal        Cardiovascular hypertension, negative cardio ROS Normal cardiovascular exam     Neuro/Psych  Headaches, PSYCHIATRIC DISORDERS  Neuromuscular disease negative neurological ROS  negative psych ROS   GI/Hepatic negative GI ROS, Neg liver ROS,   Endo/Other  negative endocrine ROS  Renal/GU negative Renal ROS  negative genitourinary   Musculoskeletal   Abdominal   Peds  Hematology negative hematology ROS (+) Blood dyscrasia, ,   Anesthesia Other Findings Past Medical History:   Asthma                                                         Comment:very mild   MVP (mitral valve prolapse)                                  Arthritis                                                    Hypertension                                                 Headache                                                       Comment:H/O   Blood dyscrasia                                              Pneumonia                                                      Comment:06-2015   Idiopathic thrombocytopenia purpura (HCC)                    Laryngitis                                      08-10-15    Past Surgical History:   HERNIA REPAIR  Comment:times 3   ABDOMINAL HYSTERECTOMY                                        SPLENECTOMY, TOTAL                                            APPENDECTOMY                                                  CESAREAN SECTION                                                 Comment:times 2   SHOULDER ARTHROSCOPY WITH ROTATOR CUFF REPAIR                   Comment:times 2   OVARIAN CYST SURGERY                                          CARPAL TUNNEL RELEASE                           Bilateral 11/19/2014      Comment:Procedure: CARPAL TUNNEL RELEASE;  Surgeon:               Donato Heinz, MD;  Location: [facility] ORS;                Service: Orthopedics;  Laterality: Bilateral;   TRIGGER FINGER RELEASE                          Right 11/19/2014      Comment:Procedure: RELEASE TRIGGER FINGER/A-1 PULLEY;                Surgeon: Donato Heinz, MD;  Location: [facility]               ORS;  Service: Orthopedics;  Laterality: Right; BMI    Body Mass Index   24.70 kg/m 2     Reproductive/Obstetrics                             Anesthesia Physical Anesthesia Plan  ASA: III  Anesthesia Plan: General and General LMA   Post-op Pain Management:    Induction:   Airway Management Planned:   Additional Equipment:   Intra-op Plan:   Post-operative Plan:   Informed Consent: I have reviewed the patients History and Physical, chart, labs and discussed the procedure including the risks, benefits and alternatives for the proposed anesthesia with the patient or authorized representative who has indicated his/her understanding and acceptance.   Dental Advisory Given  Plan Discussed with: CRNA  Anesthesia Plan Comments:         Anesthesia Quick Evaluation

## 2015-08-13 NOTE — Anesthesia Postprocedure Evaluation (Signed)
Anesthesia Post Note  Patient: MALEIAH DULA  Procedure(s) Performed: Procedure(s) (LRB): RELEASE TRIGGER LONG FINGER (Right)  Patient location during evaluation: PACU Anesthesia Type: General Level of consciousness: awake and alert Pain management: pain level controlled Vital Signs Assessment: post-procedure vital signs reviewed and stable Respiratory status: spontaneous breathing, nonlabored ventilation, respiratory function stable and patient connected to nasal cannula oxygen Cardiovascular status: blood pressure returned to baseline and stable Postop Assessment: no signs of nausea or vomiting Anesthetic complications: no    Last Vitals:  Filed Vitals:   08/12/15 1504 08/12/15 1536  BP: 120/69 120/61  Pulse: 62 67  Temp:  36.1 C  Resp: 16 16    Last Pain:  Filed Vitals:   08/12/15 1537  PainSc: 0-No pain                 Yevette Edwards

## 2015-08-26 ENCOUNTER — Telehealth: Payer: Self-pay | Admitting: Family Medicine

## 2015-08-26 MED ORDER — LEVOFLOXACIN 500 MG PO TABS: 500.0000 mg | ORAL_TABLET | Freq: Every day | ORAL | Status: AC

## 2015-08-26 NOTE — Telephone Encounter (Signed)
Pt states she came in on 08/10/2015 for cough and congestion.  Pt states she was better but about a week ago she started having congestion and cough again.  Pt is requesting a Rx to help with this if possible.  [facility]. XB#[phone]

## 2015-08-26 NOTE — Telephone Encounter (Signed)
Have sent refill for levoquin to [facility].

## 2015-08-26 NOTE — Addendum Note (Signed)
Addended by: [name] E on: 08/26/2015 05:00 PM   Modules accepted: Orders

## 2015-08-26 NOTE — Telephone Encounter (Signed)
LMOVM for pt to return call 

## 2015-08-27 MED ORDER — HYDROCODONE-HOMATROPINE 5-1.5 MG/5ML PO SYRP: 5.0000 mL | ORAL_SOLUTION | Freq: Three times a day (TID) | ORAL | Status: DC | PRN

## 2015-08-27 NOTE — Addendum Note (Signed)
Addended by: Malva Limes on: 08/27/2015 09:56 AM   Modules accepted: Orders

## 2015-08-27 NOTE — Telephone Encounter (Signed)
Patient was notified. Patient is also requesting an rx for cough syrup. Patient stated that she has been up all night coughing. Please advise?

## 2015-08-31 ENCOUNTER — Encounter: Payer: Self-pay | Admitting: Family Medicine

## 2015-08-31 ENCOUNTER — Ambulatory Visit (INDEPENDENT_AMBULATORY_CARE_PROVIDER_SITE_OTHER): Payer: Medicare PPO | Admitting: Family Medicine

## 2015-08-31 VITALS — BP 138/78 | HR 76 | Temp 98.6°F | Resp 16 | Wt 140.0 lb

## 2015-08-31 DIAGNOSIS — J189 Pneumonia, unspecified organism: Secondary | ICD-10-CM

## 2015-08-31 DIAGNOSIS — R05 Cough: Secondary | ICD-10-CM

## 2015-08-31 DIAGNOSIS — R059 Cough, unspecified: Secondary | ICD-10-CM

## 2015-08-31 MED ORDER — CEFTRIAXONE SODIUM 1 G IJ SOLR
1.0000 g | Freq: Once | INTRAMUSCULAR | Status: AC
  Administered 2015-08-31: 1 g via INTRAMUSCULAR

## 2015-08-31 MED ORDER — KETOCONAZOLE 2 % EX CREA
1.0000 | TOPICAL_CREAM | Freq: Every day | CUTANEOUS | Status: DC
Start: 2015-08-31 — End: 2016-05-16

## 2015-08-31 NOTE — Progress Notes (Signed)
Patient ID: Heidi Hernandez, female   DOB: 08/09/1950, 65 y.o.   MRN: [id number]       Patient: Heidi Hernandez Female    DOB: 08/03/1950   65 y.o.   MRN: [id number] Visit Date: 08/31/2015  Today's Provider: Mila Merryonald Ralf Konopka, MD   Chief Complaint  Patient presents with  . Cough    X over 1 month.    Subjective:    Cough This is a recurrent problem. The current episode started more than 1 month ago. The problem has been unchanged. The problem occurs constantly. The cough is productive of sputum. Associated symptoms include a fever, headaches, myalgias, nasal congestion, postnasal drip and shortness of breath.  Patient reports that she has had her cough for over 1 month now. She was diagnosed with pneumonia in 05/2015, and reports that she has been on 4 abx since then. She did have a repeat CXR 1 month later which revealed that her pneumonia has cleared.  She reports that she is currently on Levaquin 500mg  once daily, and she is almost done with her treatment. Patient reports that she has been compliant with taking her medications. She reports that she has not had to use her rescue inhaler since she has been sick. However, she does feel short of breath with exertion. Patient also mentions that she has had a low grade fever (99.0-100.0) since last night.   She is particularly concerned because she has had recurrent and prolonged bouts of Pneumonia over the last year and feels that oral antibiotics are not completely curing her. She inquires about shot of Rocephin. She is asplenic.      Allergies  Allergen Reactions  . Droperidol Other (See Comments)    "Locked jaw" Facial muscles locked  . Morphine Itching and Nausea And Vomiting  . Paxil  [Paroxetine Hcl]     syncope   Previous Medications   ALBUTEROL (VENTOLIN HFA) 108 (90 BASE) MCG/ACT INHALER    Inhale 2 puffs into the lungs every 6 (six) hours as needed for wheezing. May substitute generic or equivalent brand name   ALENDRONATE  (FOSAMAX) 70 MG TABLET    Take 70 mg by mouth once a week.    ATENOLOL (TENORMIN) 50 MG TABLET    Take 1 tablet (50 mg total) by mouth daily. pm   CETIRIZINE (ZYRTEC) 10 MG TABLET    Take 10 mg by mouth daily. pm   FLUTICASONE (FLONASE) 50 MCG/ACT NASAL SPRAY    Place 2 sprays into both nostrils daily.    HYDROCODONE-ACETAMINOPHEN (NORCO) 5-325 MG TABLET    Take 1-2 tablets by mouth every 4 (four) hours as needed for moderate pain.   HYDROCODONE-HOMATROPINE (HYCODAN) 5-1.5 MG/5ML SYRUP    Take 5 mLs by mouth every 8 (eight) hours as needed for cough.   LEVOFLOXACIN (LEVAQUIN) 500 MG TABLET    Take 1 tablet (500 mg total) by mouth daily.   MONTELUKAST (SINGULAIR) 10 MG TABLET    Take 10 mg by mouth at bedtime.   PRAMIPEXOLE (MIRAPEX) 1 MG TABLET    Take 1 tablet (1 mg total) by mouth 2 (two) times daily.   QVAR 40 MCG/ACT INHALER    Inhale 2 puffs into the lungs 2 (two) times daily.   SUMATRIPTAN (IMITREX) 100 MG TABLET    Take 100 mg by mouth every 2 (two) hours as needed for migraine. May repeat in 2 hours if headache persists or recurs.   VITAMIN D, ERGOCALCIFEROL, (DRISDOL) 50000 UNITS CAPS  CAPSULE    Take 50,000 Units by mouth every 7 (seven) days.    ZOLPIDEM (AMBIEN) 10 MG TABLET    Take 0.5-1 tablets (5-10 mg total) by mouth at bedtime as needed for sleep.    Review of Systems  Constitutional: Positive for fever.  HENT: Positive for postnasal drip.   Respiratory: Positive for cough and shortness of breath.   Musculoskeletal: Positive for myalgias.  Neurological: Positive for headaches.    Social History  Substance Use Topics  . Smoking status: Former Smoker    Types: Cigarettes    Quit date: 11/10/1979  . Smokeless tobacco: Not on file  . Alcohol Use: No   Objective:   There were no vitals taken for this visit.  Physical Exam  General Appearance:    Alert, cooperative, no distress  HENT:   bilateral TM normal without fluid or infection, neck without nodes and sinuses  nontender  Eyes:    PERRL, conjunctiva/corneas clear, EOM's intact       Lungs:     Occasional expiratory wheezes, respirations unlabored  Heart:    Regular rate and rhythm  Neurologic:   Awake, alert, oriented x 3. No apparent focal neurological           defect.           Assessment & Plan:     1. Cough I think this is most likely viral, however she has had recurrent episodes of pneumonia, is asplenic and feels like she is getting worse despite recent course of Levoquin  2. Recurrent pneumonia  - cefTRIAXone (ROCEPHIN) injection 1 g; Inject 1 g into the muscle once. . Call if symptoms change or if not rapidly improving.          [name], MD  [facility]

## 2015-09-15 IMAGING — CR DG ANKLE COMPLETE 3+V*L*
1 series · 3 of 3 positions shown · non-contrast
Comparison: None.

CLINICAL DATA: Two-month history of pain and swelling, primarily
laterally. No recent trauma.

EXAM:
LEFT ANKLE COMPLETE - 3+ VIEW

[Series 1: dg ankle complete left · 0.14mm/px · 3 of 3 slices shown]
[im 1/3]
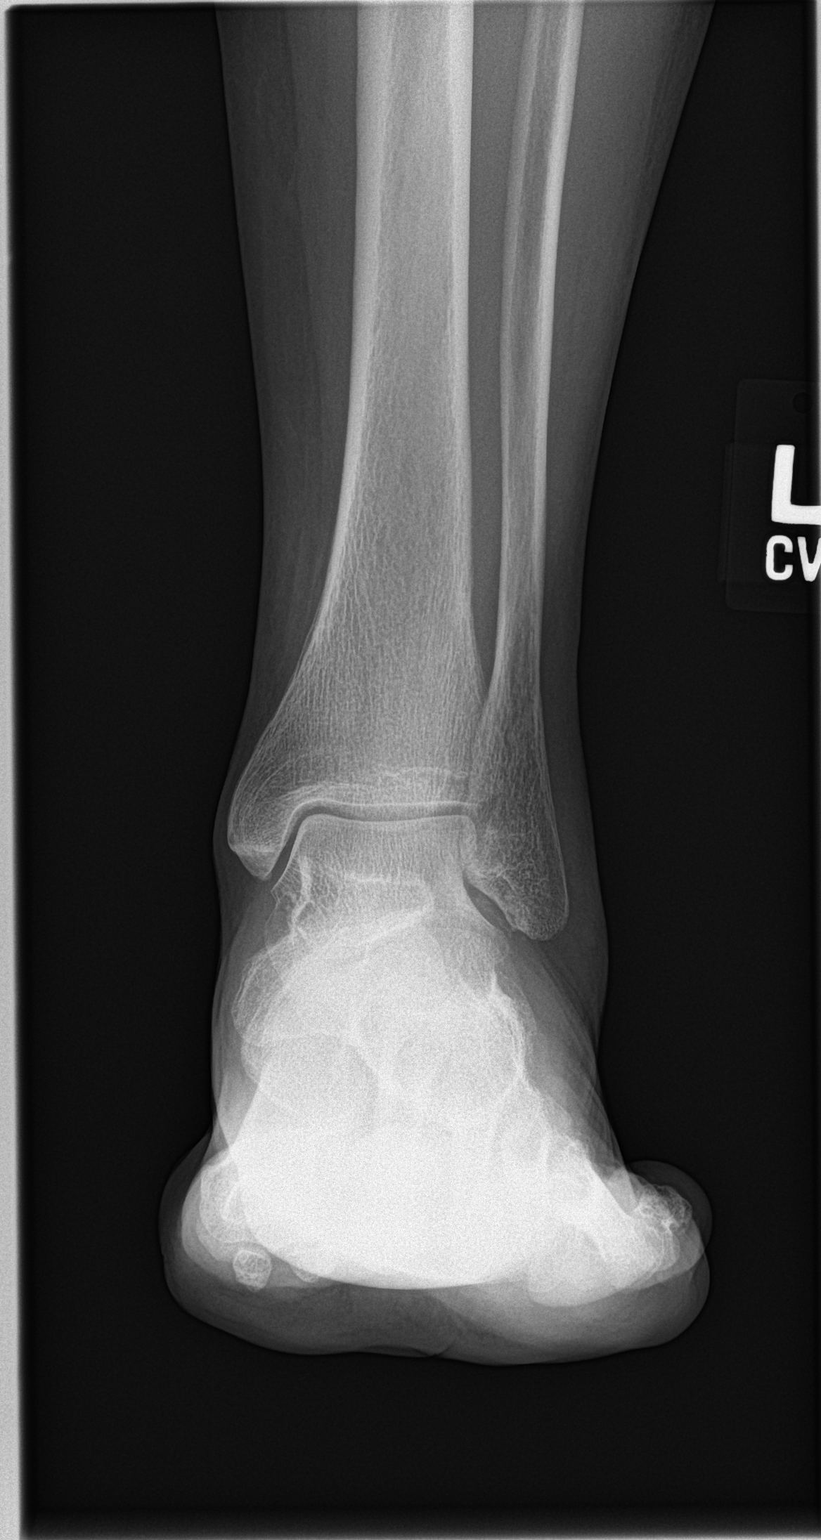
[im 2/3]
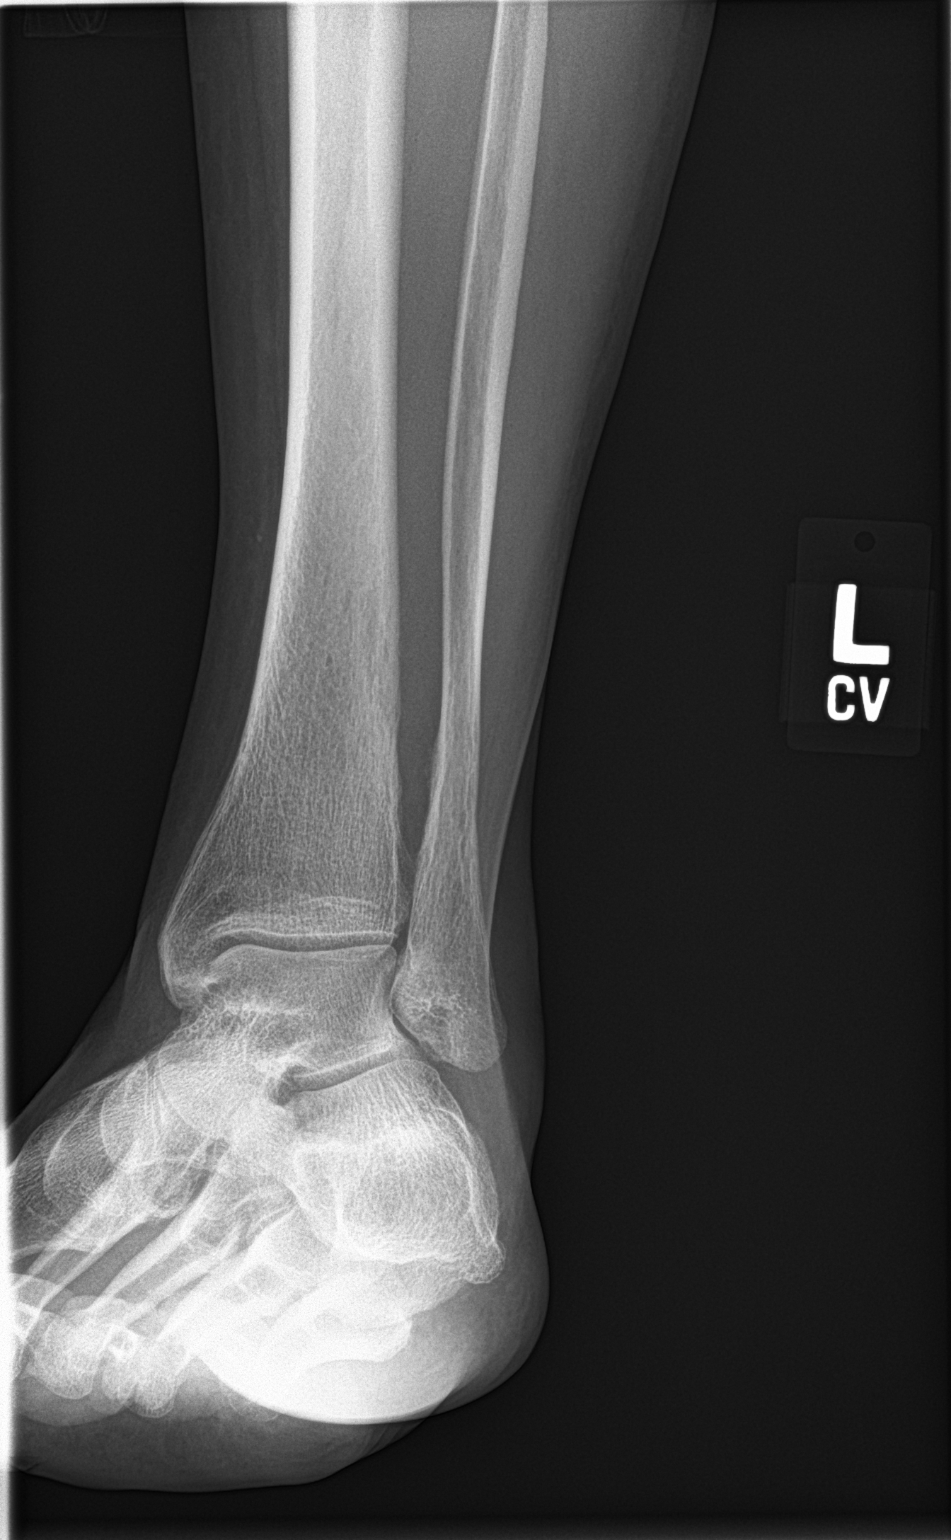
[im 3/3]
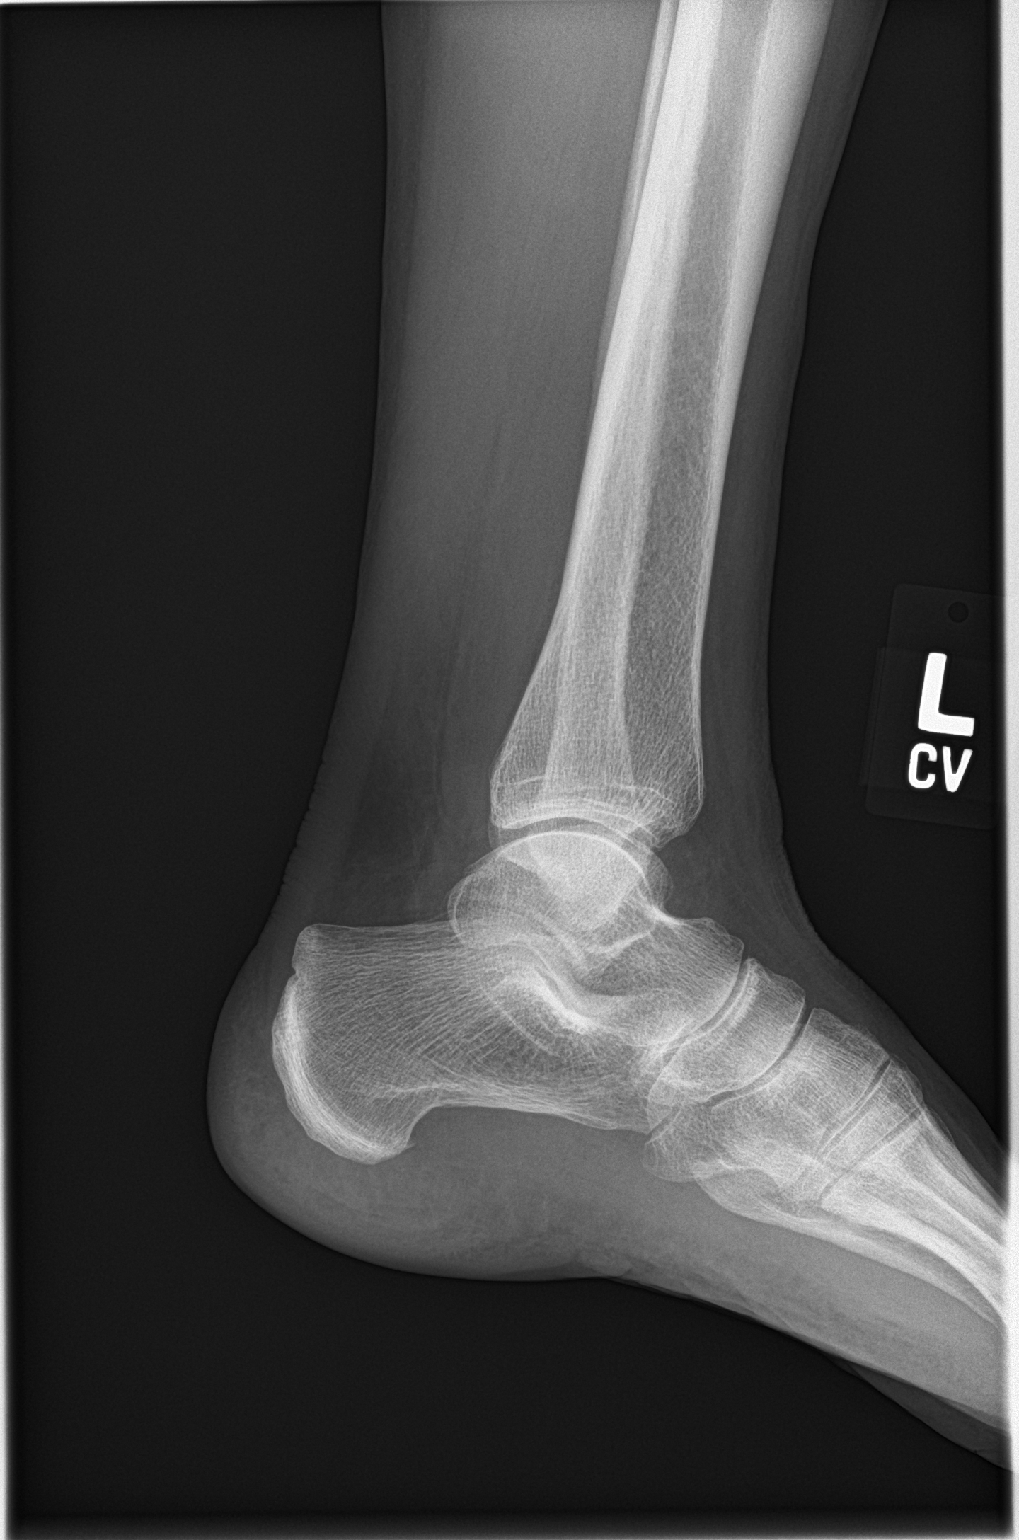

[3 of 3 positions shown; findings below may reference images not displayed]

FINDINGS: Frontal, oblique, and lateral views were obtained. There is slight
swelling laterally. There is no fracture or joint effusion. No
appreciable joint space narrowing. There is calcification between
the lateral malleolus and talus. The ankle mortise appears intact.
IMPRESSION: Calcification in the lateral aspect of the left ankle joint,
probably representing focal chondrocalcinosis. No appreciable joint
space narrowing seen. No ankle joint effusion. No fracture. Mortise
intact.

## 2015-09-16 ENCOUNTER — Other Ambulatory Visit: Payer: Self-pay | Admitting: Family Medicine

## 2015-09-16 DIAGNOSIS — Z1231 Encounter for screening mammogram for malignant neoplasm of breast: Secondary | ICD-10-CM

## 2015-09-19 ENCOUNTER — Other Ambulatory Visit: Payer: Self-pay | Admitting: Family Medicine

## 2015-09-25 ENCOUNTER — Other Ambulatory Visit: Payer: Self-pay | Admitting: Family Medicine

## 2015-09-25 ENCOUNTER — Ambulatory Visit
Admission: RE | Admit: 2015-09-25 | Discharge: 2015-09-25 | Disposition: A | Discharge status: Home / Self Care | Payer: Medicare PPO | Source: Ambulatory Visit | Attending: Family Medicine | Admitting: Family Medicine

## 2015-09-25 DIAGNOSIS — Z1231 Encounter for screening mammogram for malignant neoplasm of breast: Secondary | ICD-10-CM | POA: Diagnosis not present

## 2015-09-29 HISTORY — PX: CHOLECYSTECTOMY: SHX55

## 2015-10-15 DIAGNOSIS — K851 Biliary acute pancreatitis without necrosis or infection: Secondary | ICD-10-CM | POA: Insufficient documentation

## 2015-12-28 ENCOUNTER — Other Ambulatory Visit: Payer: Self-pay | Admitting: Family Medicine

## 2016-01-14 ENCOUNTER — Encounter: Payer: Self-pay | Admitting: Family Medicine

## 2016-01-14 ENCOUNTER — Ambulatory Visit (INDEPENDENT_AMBULATORY_CARE_PROVIDER_SITE_OTHER): Payer: Medicare PPO | Admitting: Family Medicine

## 2016-01-14 VITALS — BP 144/80 | HR 70 | Temp 98.8°F | Resp 18 | Ht 64.75 in | Wt 145.0 lb

## 2016-01-14 DIAGNOSIS — I10 Essential (primary) hypertension: Secondary | ICD-10-CM

## 2016-01-14 DIAGNOSIS — E559 Vitamin D deficiency, unspecified: Secondary | ICD-10-CM

## 2016-01-14 DIAGNOSIS — Z0000 Encounter for general adult medical examination without abnormal findings: Secondary | ICD-10-CM

## 2016-01-14 DIAGNOSIS — R5383 Other fatigue: Secondary | ICD-10-CM

## 2016-01-14 DIAGNOSIS — F418 Other specified anxiety disorders: Secondary | ICD-10-CM | POA: Diagnosis not present

## 2016-01-14 DIAGNOSIS — R1010 Upper abdominal pain, unspecified: Secondary | ICD-10-CM

## 2016-01-14 DIAGNOSIS — G2581 Restless legs syndrome: Secondary | ICD-10-CM

## 2016-01-14 DIAGNOSIS — M810 Age-related osteoporosis without current pathological fracture: Secondary | ICD-10-CM

## 2016-01-14 DIAGNOSIS — R197 Diarrhea, unspecified: Secondary | ICD-10-CM | POA: Diagnosis not present

## 2016-01-14 LAB — GLUCOSE, POCT (MANUAL RESULT ENTRY): POC Glucose: 103 mg/dl — AB (ref 70–99)

## 2016-01-14 MED ORDER — CHOLESTYRAMINE 4 GM/DOSE PO POWD: 4.0000 g | Freq: Two times a day (BID) | ORAL | Status: DC

## 2016-01-14 MED ORDER — LORAZEPAM 0.5 MG PO TABS: 0.5000 mg | ORAL_TABLET | Freq: Four times a day (QID) | ORAL | Status: DC | PRN

## 2016-01-14 NOTE — Progress Notes (Signed)
Patient: Heidi Hernandez, Female    DOB: Mar 01, 1951, 65 y.o.   MRN: [id number] Visit Date: 01/14/2016  Today's Provider: [name], MD   Chief Complaint  Patient presents with  . Annual Exam  . Insomnia  . Abdominal Pain  . Anxiety   Subjective:    Annual physical  DAILYN Hernandez is a 65 y.o. female. She feels poorly. She reports exercising. She reports she is sleeping poorly.  ----------------------------------------------------------- Follow up Vitamin D Deficiency:  Patient was last seen for this problem 3 years ago. Labs were checked during that visit and patient was advised to continue current medication. Patient has not taken Vitamin D supplements for several months.   Follow up Osteoporosis:  Patient was last seen 3 years ago and no changes were made. Patient was to continue Fosamax. Patient has not taken Fosamax for several months.   Follow up Insomnia:  Patient was last seen 7 months ago and no changes were made. Patient takes 1/2 tablet of Ambien every night. She states the Ambien is not helping her sleep. She reports only getting 3-4 hours of sleep at night.   Abdominal pain Patient reports that she has had swelling in abdomen for several months, even before she had pancreatitis and cholecystectomy in April 2017. Since then she has had diarrhea every day with yellow mucousy stool. She complains of persistent epigastric pain that is worse after eating.  She has also felt very tired, but hungry all the time. She is not sleeping well which she attributes to stress, and worsening of RLS. She is very worried about having diabetes and wants to be tested right now.     Review of Systems  Constitutional: Positive for appetite change and fatigue. Negative for fever and chills.  HENT: Positive for dental problem and postnasal drip. Negative for congestion, ear pain, rhinorrhea, sneezing and sore throat.   Eyes: Positive for visual disturbance. Negative for pain and  redness.  Respiratory: Negative for cough, shortness of breath and wheezing.   Cardiovascular: Positive for leg swelling. Negative for chest pain.  Gastrointestinal: Positive for nausea, abdominal pain, diarrhea and abdominal distention. Negative for constipation and blood in stool.  Endocrine: Positive for polydipsia, polyphagia and polyuria.  Genitourinary: Positive for frequency. Negative for dysuria, hematuria, flank pain, vaginal bleeding, vaginal discharge and pelvic pain.  Musculoskeletal: Positive for joint swelling. Negative for back pain, arthralgias and gait problem.  Skin: Negative for rash.  Allergic/Immunologic: Positive for environmental allergies.  Neurological: Positive for numbness. Negative for dizziness, tremors, seizures, weakness, light-headedness and headaches.  Hematological: Negative for adenopathy. Bruises/bleeds easily.  Psychiatric/Behavioral: Positive for sleep disturbance and agitation. Negative for behavioral problems, confusion and dysphoric mood. The patient is not nervous/anxious and is not hyperactive.     Social History   Social History  . Marital Status: Married    Spouse Name: N/A  . Number of Children: 2  . Years of Education: N/A   Occupational History  . Retired    Social History Main Topics  . Smoking status: Former Smoker    Types: Cigarettes    Quit date: 11/10/1979  . Smokeless tobacco: Not on file  . Alcohol Use: No  . Drug Use: No  . Sexual Activity: No   Other Topics Concern  . Not on file   Social History Narrative    Past Medical History  Diagnosis Date  . Asthma     very mild  . MVP (mitral valve  prolapse)   . Arthritis   . Hypertension   . Headache     H/O  . Blood dyscrasia   . Pneumonia     06-2015  . Idiopathic thrombocytopenia purpura (HCC)   . Laryngitis 08-10-15     Patient Active Problem List   Diagnosis Date Noted  . Chondrocalcinosis of left ankle 12/24/2014  . Allergic rhinitis 12/23/2014  .  Anxiety 12/23/2014  . Asplenia 12/23/2014  . History of ITP 12/23/2014  . OP (osteoporosis) 12/23/2014  . Restless legs syndrome 12/23/2014  . Neuralgia neuritis, sciatic nerve 12/23/2014  . Actinic keratosis 12/23/2014  . Mitral valve prolapse 12/23/2014  . LVH (left ventricular hypertrophy) 12/23/2014  . Carpal tunnel syndrome 11/19/2014  . Trigger finger, acquired 11/19/2014  . Vitamin D deficiency 09/28/2012  . Dysthymia 04/14/2009  . Hypertension 04/14/2009  . Headache, migraine 04/14/2009  . Artificial menopause 04/14/2009  . Insomnia 04/14/2009  . Cardiac murmur 04/14/2009    Past Surgical History  Procedure Laterality Date  . Hernia repair      times 3  . Abdominal hysterectomy    . Splenectomy, total    . Appendectomy    . Cesarean section      times 2  . Shoulder arthroscopy with rotator cuff repair      times 2  . Ovarian cyst surgery    . Carpal tunnel release Bilateral 11/19/2014    Procedure: CARPAL TUNNEL RELEASE;  Surgeon: Donato HeinzJames P Hooten, MD;  Location: [facility] ORS;  Service: Orthopedics;  Laterality: Bilateral;  . Trigger finger release Right 11/19/2014    Procedure: RELEASE TRIGGER FINGER/A-1 PULLEY;  Surgeon: Donato HeinzJames P Hooten, MD;  Location: [facility];  Service: Orthopedics;  Laterality: Right;  . Trigger finger release Right 08/12/2015    Procedure: RELEASE TRIGGER LONG FINGER;  Surgeon: Donato HeinzJames P Hooten, MD;  Location: [facility] OR[facility];  Service: Orthopedics;  Laterality: Right;  . Cholecystectomy  09/29/2015    [facility]    Her family history includes Hypertension in her mother; Renal Disease in her father.    Current Meds  Medication Sig  . albuterol (VENTOLIN HFA) 108 (90 Base) MCG/ACT inhaler Inhale 2 puffs into the lungs every 6 (six) hours as needed for wheezing. May substitute generic or equivalent brand name  . atenolol (TENORMIN) 50 MG tablet TAKE 1 TABLET BY MOUTH ONCE Heidi.  Marland Kitchen. azelastine (ASTELIN) 0.1 % nasal spray Place 1 spray into both nostrils Heidi.  Marland Kitchen.  Fexofenadine HCl (ALLEGRA PO) Take 1 capsule by mouth Heidi.  . fluticasone (FLONASE) 50 MCG/ACT nasal spray Place 2 sprays into both nostrils Heidi.   Marland Kitchen. ipratropium (ATROVENT) 0.03 % nasal spray Place 1 spray into both nostrils Heidi.  . montelukast (SINGULAIR) 10 MG tablet Take 10 mg by mouth at bedtime.  . pramipexole (MIRAPEX) 1 MG tablet Take 1 tablet (1 mg total) by mouth 2 (two) times Heidi.  Marland Kitchen. QVAR 40 MCG/ACT inhaler Inhale 2 puffs into the lungs 2 (two) times Heidi.  . SUMAtriptan (IMITREX) 100 MG tablet TAKE 1 TABLET BY MOUTH AT ONSET OF HEADACHE. MAY TAKE AN ADDITIONAL TABLET IN 2 HOURS IF NEEDED. MAX 2 TABS/24hr.  . SUMAtriptan (IMITREX) 100 MG tablet TAKE 1 TABLET BY MOUTH AT ONSET OF HEADACHE. MAY TAKE AN ADDITIONAL TABLET IN 2 HOURS IF NEEDED. MAX 2 TABS/24hr.  Marland Kitchen. zolpidem (AMBIEN) 10 MG tablet Take 0.5-1 tablets (5-10 mg total) by mouth at bedtime as needed for sleep.  . [DISCONTINUED] cetirizine (ZYRTEC) 10 MG tablet Take  10 mg by mouth Heidi. pm  . [DISCONTINUED] HYDROcodone-acetaminophen (NORCO) 5-325 MG tablet Take 1-2 tablets by mouth every 4 (four) hours as needed for moderate pain.  . [DISCONTINUED] HYDROcodone-homatropine (HYCODAN) 5-1.5 MG/5ML syrup Take 5 mLs by mouth every 8 (eight) hours as needed for cough.    Patient Care Team: Malva Limes, MD as PCP - General (Family Medicine)    Objective:   Vitals: BP 144/80 mmHg  Pulse 70  Temp(Src) 98.8 F (37.1 C) (Oral)  Resp 18  Ht 5' 4.75" (1.645 m)  Wt 145 lb (65.772 kg)  BMI 24.31 kg/m2  SpO2 96%  Physical Exam    General Appearance:    Alert, cooperative, no distress, appears stated age  Head:    Normocephalic, without obvious abnormality, atraumatic  Eyes:    PERRL, conjunctiva/corneas clear, EOM's intact, fundi    benign, both eyes  Ears:    Normal TM's and external ear canals, both ears  Nose:   Nares normal, septum midline, mucosa normal, no drainage    or sinus tenderness  Throat:   Lips,  mucosa, and tongue normal; teeth and gums normal  Neck:   Supple, symmetrical, trachea midline, no adenopathy;    thyroid:  no enlargement/tenderness/nodules; no carotid   bruit or JVD  Back:     Symmetric, no curvature, ROM normal, no CVA tenderness  Lungs:     Clear to auscultation bilaterally, respirations unlabored  Chest Wall:    No tenderness or deformity   Heart:    Regular rate and rhythm, S1 and S2 normal, no murmur, rub   or gallop  Breast Exam:    normal appearance, no masses or tenderness  Abdomen:     Soft non-distended,,bowel sounds active all four quadrants,    no masses, no organomegaly  Pelvic:    deferred  Extremities:   Extremities normal, atraumatic, no cyanosis or edema  Pulses:   2+ and symmetric all extremities  Skin:   Skin color, texture, turgor normal, no rashes or lesions  Lymph nodes:   Cervical, supraclavicular, and axillary nodes normal  Neurologic:   CNII-XII intact, normal strength, sensation and reflexes    throughout    Activities of Heidi Living In your present state of health, do you have any difficulty performing the following activities: 01/14/2016 08/10/2015  Hearing? N -  Vision? N -  Difficulty concentrating or making decisions? N -  Walking or climbing stairs? N -  Dressing or bathing? N -  Doing errands, shopping? N N    Fall Risk Assessment Fall Risk  01/14/2016 06/08/2015  Falls in the past year? No No     Depression Screen PHQ 2/9 Scores 01/14/2016 06/08/2015  PHQ - 2 Score 0 0    Cognitive Testing - 6-CIT  Correct? Score   What year is it? yes 0 0 or 4  What month is it? yes 0 0 or 3  Memorize:    Floyde Parkins,  40,  [address],  [place],      What time is it? (within 1 hour) yes 0 0 or 3  Count backwards from 20 yes 0 0, 2, or 4  Name the months of the year yes 0 0, 2, or 4  Repeat name & address above yes 0 0, 2, 4, 6, 8, or 10       TOTAL SCORE  0/28   Interpretation:  Normal  Normal (0-7) Abnormal (8-28)   Audit-C  Alcohol Use Screening  Question Answer Points  How often do you have alcoholic drink? 1 times monthly 1  On days you do drink alcohol, how many drinks do you typically consume? 1 or 2 0  How oftey will you drink 6 or more in a total? never 1  Total Score:  1   A score of 3 or more in women, and 4 or more in men indicates increased risk for alcohol abuse, EXCEPT if all of the points are from question 1.     Current Exercise Habits: Home exercise routine, Type of exercise: walking, Time (Minutes): 30, Frequency (Times/Week): 3, Weekly Exercise (Minutes/Week): 90, Intensity: Mild Exercise limited by: None identified     Assessment & Plan:     Health maintenence Reviewed patient's Family Medical History Reviewed and updated list of patient's medical providers Assessment of cognitive impairment was done Assessed patient's functional ability Established a written schedule for health screening services Health Risk Assessent Completed and Reviewed  Exercise Activities and Dietary recommendations Goals    None      Immunization History  Administered Date(s) Administered  . Pneumococcal Polysaccharide-23 04/14/2009  . Tdap 05/17/2011    Health Maintenance  Topic Date Due  . Hepatitis C Screening  03-Jan-1951  . COLONOSCOPY  12/02/1998  . ZOSTAVAX  12/01/2008  . PNA vac Low Risk Adult (1 of 2 - PCV13) 12/01/2013  . INFLUENZA VACCINE  01/26/2016  . MAMMOGRAM  09/24/2017  . TETANUS/TDAP  05/16/2021  . DEXA SCAN  Completed      Discussed health benefits of physical activity, and encouraged her to engage in regular exercise appropriate for her age and condition.    ------------------------------------------------------------------------------------------------------------ 1. Annual physical   Has been 10 years since last colonoscopy which was reportedly normal.  She was given rx for Zostavax which she will take to her pharmaacist.   2. Pain of upper abdomen Recent case of  pancreatitis attributed to cholecystitis.  - Amylase - Ambulatory referral to Gastroenterology  3. Other fatigue  - CBC - Comprehensive metabolic panel - TSH - POCT Glucose (CBG)  4. Vitamin D deficiency   5. OP (osteoporosis)  - DG Bone Density; Future  6. Diarrhea, unspecified type Likely due to recent cholecystectomy. Try rx cholestyramine.   7. Essential hypertension Well controlled.  Continue current medications.   - Lipid panel  8. Restless legs syndrome She wants to try something besides Mirapex which no longer seems to be working, see how labs look first.  - Ferritin  9. Situational anxiety She states she has been anxious lately due to health issues and caring for grandchildren, and is having a lot of trouble resting at night. Suspect this is aggravated her other symptoms. She has taken paroxetine in the past which she did not tolerate. Sill start low dose lorazepam for the time being     [name], MD  [facility]

## 2016-01-15 ENCOUNTER — Telehealth: Payer: Self-pay | Admitting: Family Medicine

## 2016-01-15 LAB — LIPID PANEL
CHOLESTEROL TOTAL: 151 mg/dL (ref 100–199)
Chol/HDL Ratio: 2.5 ratio units (ref 0.0–4.4)
HDL: 61 mg/dL (ref >39)
LDL Calculated: 65 mg/dL (ref 0–99)
TRIGLYCERIDES: 124 mg/dL (ref 0–149)
VLDL Cholesterol Cal: 25 mg/dL (ref 5–40)

## 2016-01-15 LAB — CBC
HEMATOCRIT: 40.5 % (ref 34.0–46.6)
Hemoglobin: 13.9 g/dL (ref 11.1–15.9)
MCH: 31.7 pg (ref 26.6–33.0)
MCHC: 34.3 g/dL (ref 31.5–35.7)
MCV: 93 fL (ref 79–97)
Platelets: 141 10*3/uL — ABNORMAL LOW (ref 150–379)
RBC: 4.38 x10E6/uL (ref 3.77–5.28)
RDW: 13.5 % (ref 12.3–15.4)
WBC: 6.1 10*3/uL (ref 3.4–10.8)

## 2016-01-15 LAB — COMPREHENSIVE METABOLIC PANEL
A/G RATIO: 1.7 (ref 1.2–2.2)
ALT: 18 IU/L (ref 0–32)
AST: 20 IU/L (ref 0–40)
Albumin: 4.2 g/dL (ref 3.6–4.8)
Alkaline Phosphatase: 69 IU/L (ref 39–117)
BILIRUBIN TOTAL: 0.5 mg/dL (ref 0.0–1.2)
BUN/Creatinine Ratio: 19 (ref 12–28)
BUN: 13 mg/dL (ref 8–27)
CHLORIDE: 105 mmol/L (ref 96–106)
CO2: 22 mmol/L (ref 18–29)
Calcium: 9.1 mg/dL (ref 8.7–10.3)
Creatinine, Ser: 0.68 mg/dL (ref 0.57–1.00)
GFR calc Af Amer: 105 mL/min/{1.73_m2} (ref >59)
GFR calc non Af Amer: 91 mL/min/{1.73_m2} (ref >59)
GLOBULIN, TOTAL: 2.5 g/dL (ref 1.5–4.5)
Glucose: 90 mg/dL (ref 65–99)
POTASSIUM: 4.1 mmol/L (ref 3.5–5.2)
Sodium: 143 mmol/L (ref 134–144)
Total Protein: 6.7 g/dL (ref 6.0–8.5)

## 2016-01-15 LAB — AMYLASE: Amylase: 55 U/L (ref 31–124)

## 2016-01-15 LAB — TSH: TSH: 1.3 u[IU]/mL (ref 0.450–4.500)

## 2016-01-15 LAB — FERRITIN: Ferritin: 48 ng/mL (ref 15–150)

## 2016-01-15 NOTE — Telephone Encounter (Signed)
Pt is requesting a call back.  ZO#[phone]/WJCB#[phone]/MW

## 2016-01-15 NOTE — Telephone Encounter (Signed)
Pt is requesting results of blood work °

## 2016-01-15 NOTE — Telephone Encounter (Signed)
Pt advised as directed below.   Thanks,   -Laura  

## 2016-01-15 NOTE — Telephone Encounter (Signed)
So far, labs are normal including pancreatic functions, blood cell count, kidney and liver functions. We are awaiting results of h. Pylori test which should be in next week.

## 2016-01-15 NOTE — Telephone Encounter (Signed)
See message-aa 

## 2016-01-29 NOTE — Telephone Encounter (Signed)
error 

## 2016-02-01 ENCOUNTER — Other Ambulatory Visit: Payer: Self-pay | Admitting: *Deleted

## 2016-02-01 MED ORDER — LORAZEPAM 0.5 MG PO TABS: 0.5000 mg | ORAL_TABLET | Freq: Four times a day (QID) | ORAL | 3 refills | Status: DC | PRN

## 2016-02-06 LAB — H PYLORI, IGM, IGG, IGA AB: H pylori, IgM Abs: <9 units (ref 0.0–8.9)

## 2016-02-06 LAB — SPECIMEN STATUS REPORT

## 2016-02-11 ENCOUNTER — Ambulatory Visit
Admission: RE | Admit: 2016-02-11 | Discharge: 2016-02-11 | Disposition: A | Discharge status: Home / Self Care | Payer: Medicare PPO | Source: Ambulatory Visit | Attending: Family Medicine | Admitting: Family Medicine

## 2016-02-11 DIAGNOSIS — Z13820 Encounter for screening for osteoporosis: Secondary | ICD-10-CM | POA: Insufficient documentation

## 2016-02-11 DIAGNOSIS — M810 Age-related osteoporosis without current pathological fracture: Secondary | ICD-10-CM

## 2016-02-11 DIAGNOSIS — Z780 Asymptomatic menopausal state: Secondary | ICD-10-CM | POA: Insufficient documentation

## 2016-02-12 ENCOUNTER — Other Ambulatory Visit: Payer: Self-pay | Admitting: *Deleted

## 2016-02-12 MED ORDER — ALENDRONATE SODIUM 70 MG PO TABS: 70.0000 mg | ORAL_TABLET | ORAL | 4 refills | Status: DC

## 2016-03-17 ENCOUNTER — Telehealth: Payer: Self-pay

## 2016-03-17 ENCOUNTER — Telehealth: Payer: Self-pay | Admitting: Family Medicine

## 2016-03-17 MED ORDER — ROPINIROLE HCL ER 2 MG PO TB24: 2.0000 mg | ORAL_TABLET | Freq: Every day | ORAL | 1 refills | Status: DC

## 2016-03-17 NOTE — Telephone Encounter (Signed)
Pt stated that when she was in the office with her son a few weeks ago she discussed with Dr. Sherrie MustacheFisher that the pramipexole (MIRAPEX) 1 MG tablet wasn't working for her taking it twice a day and she was having to increase the dose to get relief from restless leg. Pt stated that she had discussed changing the medication to try something new. Pt stated that Dr. Sherrie MustacheFisher had made a note but maybe he forgot and she just wanted to go ahead and try something new and it to be sent to [facility]. Pt was advised that Dr. Sherrie MustacheFisher is out of the until 03/28/16 and she would like another provider to look into this. Please advise. Thanks TNP

## 2016-03-17 NOTE — Telephone Encounter (Signed)
Please advise 

## 2016-03-17 NOTE — Telephone Encounter (Signed)
[facility] wanted to verify the sig on pt's Requip.  They wanted to make sure it was supposed to be "Take 1 tablet by mouth at bedtime, Increase to two tablets at bedtime after two weeks. "   Please advised.    Thanks,   -Vernona RiegerLaura

## 2016-03-17 NOTE — Telephone Encounter (Signed)
That's correct. Make sure it is for Requip XL 2mg 

## 2016-03-17 NOTE — Telephone Encounter (Signed)
Rx for requip has been sent to [facility]. This replaces Mirapex. .Marland Kitchen

## 2016-03-18 NOTE — Telephone Encounter (Signed)
Notified. 

## 2016-04-29 ENCOUNTER — Encounter: Payer: Self-pay | Admitting: *Deleted

## 2016-05-02 ENCOUNTER — Encounter: Payer: Self-pay | Admitting: *Deleted

## 2016-05-02 ENCOUNTER — Ambulatory Visit: Payer: Medicare PPO | Admitting: Certified Registered"

## 2016-05-02 ENCOUNTER — Encounter
Admission: RE | Disposition: A | Discharge status: Home / Self Care | Payer: Self-pay | Source: Ambulatory Visit | Attending: Unknown Physician Specialty

## 2016-05-02 ENCOUNTER — Ambulatory Visit
Admission: RE | Admit: 2016-05-02 | Discharge: 2016-05-02 | Disposition: A | Discharge status: Home / Self Care | Payer: Medicare PPO | Source: Ambulatory Visit | Attending: Unknown Physician Specialty | Admitting: Unknown Physician Specialty

## 2016-05-02 DIAGNOSIS — I10 Essential (primary) hypertension: Secondary | ICD-10-CM | POA: Diagnosis not present

## 2016-05-02 DIAGNOSIS — K219 Gastro-esophageal reflux disease without esophagitis: Secondary | ICD-10-CM | POA: Insufficient documentation

## 2016-05-02 DIAGNOSIS — Z1211 Encounter for screening for malignant neoplasm of colon: Secondary | ICD-10-CM | POA: Diagnosis not present

## 2016-05-02 DIAGNOSIS — R1013 Epigastric pain: Secondary | ICD-10-CM | POA: Diagnosis present

## 2016-05-02 DIAGNOSIS — K640 First degree hemorrhoids: Secondary | ICD-10-CM | POA: Diagnosis not present

## 2016-05-02 DIAGNOSIS — K2950 Unspecified chronic gastritis without bleeding: Secondary | ICD-10-CM | POA: Diagnosis not present

## 2016-05-02 DIAGNOSIS — I341 Nonrheumatic mitral (valve) prolapse: Secondary | ICD-10-CM | POA: Diagnosis not present

## 2016-05-02 DIAGNOSIS — G43909 Migraine, unspecified, not intractable, without status migrainosus: Secondary | ICD-10-CM | POA: Diagnosis not present

## 2016-05-02 DIAGNOSIS — K3189 Other diseases of stomach and duodenum: Secondary | ICD-10-CM | POA: Insufficient documentation

## 2016-05-02 DIAGNOSIS — Z79899 Other long term (current) drug therapy: Secondary | ICD-10-CM | POA: Insufficient documentation

## 2016-05-02 DIAGNOSIS — J45909 Unspecified asthma, uncomplicated: Secondary | ICD-10-CM | POA: Insufficient documentation

## 2016-05-02 DIAGNOSIS — K635 Polyp of colon: Secondary | ICD-10-CM | POA: Diagnosis not present

## 2016-05-02 DIAGNOSIS — Z7983 Long term (current) use of bisphosphonates: Secondary | ICD-10-CM | POA: Diagnosis not present

## 2016-05-02 DIAGNOSIS — Z7951 Long term (current) use of inhaled steroids: Secondary | ICD-10-CM | POA: Insufficient documentation

## 2016-05-02 DIAGNOSIS — Z87891 Personal history of nicotine dependence: Secondary | ICD-10-CM | POA: Diagnosis not present

## 2016-05-02 DIAGNOSIS — F419 Anxiety disorder, unspecified: Secondary | ICD-10-CM | POA: Insufficient documentation

## 2016-05-02 HISTORY — DX: Myoneural disorder, unspecified: G70.9

## 2016-05-02 HISTORY — DX: Anxiety disorder, unspecified: F41.9

## 2016-05-02 HISTORY — DX: Gastro-esophageal reflux disease without esophagitis: K21.9

## 2016-05-02 HISTORY — PX: COLONOSCOPY: SHX5424

## 2016-05-02 HISTORY — PX: ESOPHAGOGASTRODUODENOSCOPY (EGD) WITH PROPOFOL: SHX5813

## 2016-05-02 SURGERY — ESOPHAGOGASTRODUODENOSCOPY (EGD) WITH PROPOFOL
Anesthesia: General

## 2016-05-02 MED ORDER — EPHEDRINE SULFATE 50 MG/ML IJ SOLN
INTRAMUSCULAR | Status: DC | PRN
  Administered 2016-05-02: 5 mg via INTRAVENOUS
  Administered 2016-05-02: 10 mg via INTRAVENOUS

## 2016-05-02 MED ORDER — PROPOFOL 500 MG/50ML IV EMUL
INTRAVENOUS | Status: DC | PRN
  Administered 2016-05-02: 120 ug/kg/min via INTRAVENOUS

## 2016-05-02 MED ORDER — PHENYLEPHRINE HCL 10 MG/ML IJ SOLN
INTRAMUSCULAR | Status: DC | PRN
  Administered 2016-05-02: 100 ug via INTRAVENOUS
  Administered 2016-05-02 (×2): 50 ug via INTRAVENOUS

## 2016-05-02 MED ORDER — PROPOFOL 10 MG/ML IV BOLUS
INTRAVENOUS | Status: DC | PRN
  Administered 2016-05-02: 30 mg via INTRAVENOUS
  Administered 2016-05-02: 20 mg via INTRAVENOUS
  Administered 2016-05-02: 50 mg via INTRAVENOUS

## 2016-05-02 MED ORDER — LIDOCAINE 2% (20 MG/ML) 5 ML SYRINGE
INTRAMUSCULAR | Status: DC | PRN
  Administered 2016-05-02: 50 mg via INTRAVENOUS

## 2016-05-02 MED ORDER — GLYCOPYRROLATE 0.2 MG/ML IJ SOLN
INTRAMUSCULAR | Status: DC | PRN
  Administered 2016-05-02: 0.2 mg via INTRAVENOUS

## 2016-05-02 MED ORDER — SODIUM CHLORIDE 0.9 % IV SOLN: INTRAVENOUS | Status: DC

## 2016-05-02 MED ORDER — FENTANYL CITRATE (PF) 100 MCG/2ML IJ SOLN
INTRAMUSCULAR | Status: DC | PRN
  Administered 2016-05-02: 50 ug via INTRAVENOUS

## 2016-05-02 MED ORDER — MIDAZOLAM HCL 5 MG/5ML IJ SOLN
INTRAMUSCULAR | Status: DC | PRN
  Administered 2016-05-02: 1 mg via INTRAVENOUS

## 2016-05-02 MED ORDER — SODIUM CHLORIDE 0.9 % IV SOLN
INTRAVENOUS | Status: DC
  Administered 2016-05-02: 10:00:00 via INTRAVENOUS

## 2016-05-02 NOTE — H&P (Signed)
Primary Care Physician:  Mila Merryonald Fisher, MD Primary Gastroenterologist:  Dr. Mechele CollinElliott  Pre-Procedure History & Physical: HPI:  Heidi Hernandez is a 65 y.o. female is here for an endoscopy and colonoscopy.   Past Medical History:  Diagnosis Date  . Anxiety   . Asthma   . GERD (gastroesophageal reflux disease)   . Headache    Migraines  . Hypertension   . Idiopathic thrombocytopenia purpura (HCC)    Required splenectomy  . MVP (mitral valve prolapse)   . Neuromuscular disorder (HCC)    Restless Leg Syndrome  . [facility]   [place] due to cholecystitis.   . Pneumonia    06-2015    Past Surgical History:  Procedure Laterality Date  . ABDOMINAL HYSTERECTOMY    . APPENDECTOMY    . CARPAL TUNNEL RELEASE Bilateral 11/19/2014   Procedure: CARPAL TUNNEL RELEASE;  Surgeon: Donato HeinzJames P Hooten, MD;  Location: [facility] ORS;  Service: Orthopedics;  Laterality: Bilateral;  . CESAREAN SECTION     times 2  . CHOLECYSTECTOMY  09/29/2015   [facility]  . HERNIA REPAIR     times 3  . OVARIAN CYST SURGERY    . SHOULDER ARTHROSCOPY WITH ROTATOR CUFF REPAIR     times 2  . SPLENECTOMY, TOTAL    . TRIGGER FINGER RELEASE Right 11/19/2014   Procedure: RELEASE TRIGGER FINGER/A-1 PULLEY;  Surgeon: Donato HeinzJames P Hooten, MD;  Location: [facility] OR[facility];  Service: Orthopedics;  Laterality: Right;  . TRIGGER FINGER RELEASE Right 08/12/2015   Procedure: RELEASE TRIGGER LONG FINGER;  Surgeon: Donato HeinzJames P Hooten, MD;  Location: [facility] OR[facility];  Service: Orthopedics;  Laterality: Right;    Prior to Admission medications   Medication Sig Start Date End Date Taking? Authorizing Provider  albuterol (VENTOLIN HFA) 108 (90 Base) MCG/ACT inhaler Inhale 2 puffs into the lungs every 6 (six) hours as needed for wheezing. May substitute generic or equivalent brand name 07/07/15  Yes Malva Limesonald E Fisher, MD  atenolol (TENORMIN) 50 MG tablet TAKE 1 TABLET BY MOUTH ONCE DAILY. 12/28/15  Yes Malva Limesonald E Fisher, MD  pramipexole (MIRAPEX) 0.125 MG tablet  Take 0.125 mg by mouth 2 (two) times daily at 10 AM and 5 PM.   Yes Historical Provider, MD  SUMAtriptan (IMITREX) 100 MG tablet TAKE 1 TABLET BY MOUTH AT ONSET OF HEADACHE. MAY TAKE AN ADDITIONAL TABLET IN 2 HOURS IF NEEDED. MAX 2 TABS/24hr. 09/20/15  Yes Malva Limesonald E Fisher, MD  SUMAtriptan (IMITREX) 100 MG tablet TAKE 1 TABLET BY MOUTH AT ONSET OF HEADACHE. MAY TAKE AN ADDITIONAL TABLET IN 2 HOURS IF NEEDED. MAX 2 TABS/24hr. 09/20/15  Yes Malva Limesonald E Fisher, MD  Vitamin D, Ergocalciferol, (DRISDOL) 50000 UNITS CAPS capsule Take 50,000 Units by mouth every 7 (seven) days. Reported on 01/14/2016 10/01/12  Yes Historical Provider, MD  alendronate (FOSAMAX) 70 MG tablet Take 1 tablet (70 mg total) by mouth once a week. 02/12/16   Malva Limesonald E Fisher, MD  azelastine (ASTELIN) 0.1 % nasal spray Place 1 spray into both nostrils daily. 12/24/13   Historical Provider, MD  cholestyramine Lanetta Inch(QUESTRAN) 4 GM/DOSE powder Take 1 packet (4 g total) by mouth 2 (two) times daily with a meal. 01/14/16   Malva Limesonald E Fisher, MD  Fexofenadine HCl (ALLEGRA PO) Take 1 capsule by mouth daily.    Historical Provider, MD  fluticasone (FLONASE) 50 MCG/ACT nasal spray Place 2 sprays into both nostrils daily.  12/05/14   Historical Provider, MD  ipratropium (ATROVENT) 0.03 % nasal spray  Place 1 spray into both nostrils daily. 12/04/15   Historical Provider, MD  ketoconazole (NIZORAL) 2 % cream Apply 1 application topically daily. Patient not taking: Reported on 01/14/2016 08/31/15   Malva Limesonald E Fisher, MD  LORazepam (ATIVAN) 0.5 MG tablet Take 1 tablet (0.5 mg total) by mouth every 6 (six) hours as needed for anxiety. 02/01/16   Malva Limesonald E Fisher, MD  montelukast (SINGULAIR) 10 MG tablet Take 10 mg by mouth at bedtime.    Historical Provider, MD  QVAR 40 MCG/ACT inhaler Inhale 2 puffs into the lungs 2 (two) times daily. 07/18/15   Historical Provider, MD  rOPINIRole (REQUIP XL) 2 MG 24 hr tablet Take 1 tablet (2 mg total) by mouth at bedtime. Increase to tablets at  bedtime after 2 seeks. 03/17/16   Malva Limesonald E Fisher, MD  zolpidem (AMBIEN) 10 MG tablet Take 0.5-1 tablets (5-10 mg total) by mouth at bedtime as needed for sleep. 06/08/15   Malva Limesonald E Fisher, MD    Allergies as of 03/14/2016 - Review Complete 01/14/2016  Allergen Reaction Noted  . Droperidol Other (See Comments) 11/10/2014  . Morphine Itching and Nausea And Vomiting 12/23/2014  . Paxil  [paroxetine hcl]  12/23/2014    Family History  Problem Relation Age of Onset  . Hypertension Mother   . Renal Disease Father     ESRD with HD    Social History   Social History  . Marital status: Married    Spouse name: N/A  . Number of children: 2  . Years of education: N/A   Occupational History  . Retired    Social History Main Topics  . Smoking status: Former Smoker    Packs/day: 1.00    Years: 10.00    Types: Cigarettes    Quit date: 11/10/1979  . Smokeless tobacco: Never Used  . Alcohol use 0.0 oz/week  . Drug use: No  . Sexual activity: No   Other Topics Concern  . Not on file   Social History Narrative  . No narrative on file    Review of Systems: See HPI, otherwise negative ROS  Physical Exam: BP (!) 148/70   Pulse (!) 53   Temp 97.6 F (36.4 C) (Tympanic)   Resp 16   Ht 5\' 3"  (1.6 m)   Wt 61.2 kg (135 lb)   SpO2 98%   BMI 23.91 kg/m  General:   Alert,  pleasant and cooperative in NAD Head:  Normocephalic and atraumatic. Neck:  Supple; no masses or thyromegaly. Lungs:  Clear throughout to auscultation.    Heart:  Regular rate and rhythm. Abdomen:  Soft, nontender and nondistended. Normal bowel sounds, without guarding, and without rebound.   Neurologic:  Alert and  oriented x4;  grossly normal neurologically.  Impression/Plan: Heidi PaoCarol M Bianchini is here for an endoscopy and colonoscopy to be performed for epigastric abdominal pain and heartburn.  Risks, benefits, limitations, and alternatives regarding  endoscopy and colonoscopy have been reviewed with the  patient.  Questions have been answered.  All parties agreeable.   Lynnae PrudeELLIOTT, Lasharn Bufkin, MD  05/02/2016, 10:20 AM

## 2016-05-02 NOTE — Anesthesia Postprocedure Evaluation (Signed)
Anesthesia Post Note  Patient: Heidi Hernandez  Procedure(s) Performed: Procedure(s) (LRB): ESOPHAGOGASTRODUODENOSCOPY (EGD) WITH PROPOFOL (N/A) COLONOSCOPY (N/A)  Patient location during evaluation: Endoscopy Anesthesia Type: General Level of consciousness: awake and alert Pain management: pain level controlled Vital Signs Assessment: post-procedure vital signs reviewed and stable Respiratory status: spontaneous breathing and respiratory function stable Cardiovascular status: stable Anesthetic complications: no    Last Vitals:  Vitals:   05/02/16 1106 05/02/16 1108  BP: (!) 96/57 (!) 96/57  Pulse: (!) 59 65  Resp: 16 (!) 21  Temp: 36.2 C (!) 36 C    Last Pain:  Vitals:   05/02/16 1108  TempSrc: Tympanic                 Mohit Zirbes K

## 2016-05-02 NOTE — Op Note (Signed)
[facility] Gastroenterology Patient Name: Heidi PiliCarol Hernandez Procedure Date: 05/02/2016 10:22 AM MRN: [id number] Account #: [id number] Date of Birth: 08/12/1950 Admit Type: Outpatient Age: [age] [facility] Gender: Female Note Status: Finalized Procedure:            Upper GI endoscopy Indications:          Heartburn Providers:            Scot Junobert T. Elliott, MD Referring MD:         Demetrios Isaacsonald E. Sherrie MustacheFisher, MD (Referring MD) Medicines:            Propofol per Anesthesia Complications:        No immediate complications. Procedure:            Pre-Anesthesia Assessment:                       - After reviewing the risks and benefits, the patient                        was deemed in satisfactory condition to undergo the                        procedure.                       After obtaining informed consent, the endoscope was                        passed under direct vision. Throughout the procedure,                        the patient's blood pressure, pulse, and oxygen                        saturations were monitored continuously. The Endoscope                        was introduced through the mouth, and advanced to the                        second part of duodenum. The upper GI endoscopy was                        accomplished without difficulty. The patient tolerated                        the procedure well. Findings:      The examined esophagus was normal. At the end of the procedure A       guidewire was placed and the scope was withdrawn. Dilation was performed       with a Savary dilator with mild resistance at 16 mm.      Localized mild inflammation characterized by erythema and granularity       was found at the gastroesophageal junction and in the cardia. Biopsies       were taken with a cold forceps for histology.      Patchy mild inflammation characterized by erythema and granularity was       found in the gastric body. Biopsies were taken with a cold  forceps for       histology. Biopsies were taken with a  cold forceps for Helicobacter       pylori testing.      The duodenal bulb was normal. Second portion normal but slight       flattening of the mucosa and biopsies done because of this. Impression:           - Normal esophagus. Dilated.                       - Gastritis. Biopsied.                       - Gastritis. Biopsied.                       - Normal duodenal bulb. Recommendation:       - Await pathology results. Do colonoscopy. Continue                        medications. Scot Junobert T Elliott, MD 05/02/2016 10:42:21 AM This report has been signed electronically. Number of Addenda: 0 Note Initiated On: 05/02/2016 10:22 AM      [facility]

## 2016-05-02 NOTE — Anesthesia Preprocedure Evaluation (Signed)
Anesthesia Evaluation  Patient identified by MRN, date of birth, ID band Patient awake    Reviewed: Allergy & Precautions, NPO status , Patient's Chart, lab work & pertinent test results  History of Anesthesia Complications Negative for: history of anesthetic complications  Airway Mallampati: II       Dental   Pulmonary asthma , former smoker,           Cardiovascular hypertension, Pt. on medications and Pt. on home beta blockers      Neuro/Psych Anxiety Depression    GI/Hepatic negative GI ROS, Neg liver ROS,   Endo/Other    Renal/GU negative Renal ROS     Musculoskeletal   Abdominal   Peds  Hematology  (+) Blood dyscrasia (ITP), ,   Anesthesia Other Findings   Reproductive/Obstetrics                             Anesthesia Physical Anesthesia Plan  ASA: III  Anesthesia Plan: General   Post-op Pain Management:    Induction: Intravenous  Airway Management Planned:   Additional Equipment:   Intra-op Plan:   Post-operative Plan:   Informed Consent: I have reviewed the patients History and Physical, chart, labs and discussed the procedure including the risks, benefits and alternatives for the proposed anesthesia with the patient or authorized representative who has indicated his/her understanding and acceptance.     Plan Discussed with:   Anesthesia Plan Comments:         Anesthesia Quick Evaluation

## 2016-05-02 NOTE — Transfer of Care (Signed)
Immediate Anesthesia Transfer of Care Note  Patient: Heidi PaoCarol M Marcello  Procedure(s) Performed: Procedure(s): ESOPHAGOGASTRODUODENOSCOPY (EGD) WITH PROPOFOL (N/A) COLONOSCOPY (N/A)  Patient Location: Endoscopy Unit  Anesthesia Type:General  Level of Consciousness: awake and alert   Airway & Oxygen Therapy: Patient Spontanous Breathing and Patient connected to nasal cannula oxygen  Post-op Assessment: Report given to RN and Post -op Vital signs reviewed and stable  Post vital signs: Reviewed  Last Vitals:  Vitals:   05/02/16 0947 05/02/16 1106  BP: (!) 148/70 (!) 96/57  Pulse: (!) 53 (!) 59  Resp: 16 16  Temp: 36.4 C 36.2 C    Last Pain:  Vitals:   05/02/16 0947  TempSrc: Tympanic         Complications: No apparent anesthesia complications

## 2016-05-02 NOTE — Op Note (Addendum)
[facility] Gastroenterology Patient Name: Heidi PiliCarol Brabant Procedure Date: 05/02/2016 10:21 AM MRN: [id number] Account #: [id number] Date of Birth: 04/26/1951 Admit Type: Outpatient Age: [age] [facility] Gender: Female Note Status: Finalized Procedure:            Colonoscopy Indications:          Screening for colorectal malignant neoplasm Providers:            Scot Junobert T. Zakiah Beckerman, MD Referring MD:         Demetrios Isaacsonald E. Sherrie MustacheFisher, MD (Referring MD) Medicines:            Propofol per Anesthesia Complications:        No immediate complications. Procedure:            Pre-Anesthesia Assessment:                       - After reviewing the risks and benefits, the patient                        was deemed in satisfactory condition to undergo the                        procedure.                       After obtaining informed consent, the colonoscope was                        passed under direct vision. Throughout the procedure,                        the patient's blood pressure, pulse, and oxygen                        saturations were monitored continuously. The                        Colonoscope was introduced through the anus and                        advanced to the the cecum, identified by appendiceal                        orifice and ileocecal valve. The colonoscopy was                        performed without difficulty. The patient tolerated the                        procedure well. The quality of the bowel preparation                        was excellent. Findings:      A faint small erythematous spot in cecum probably small AVM. No evidence       of bleeding.      A diminutive polyp was found in the proximal sigmoid colon. The polyp       was sessile. The polyp was removed with a jumbo cold forceps. Resection       and retrieval were complete.      Internal hemorrhoids were found during endoscopy.  The hemorrhoids were       small and Grade I (internal  hemorrhoids that do not prolapse).      The exam was otherwise without abnormality. Impression:           - One diminutive polyp in the proximal sigmoid colon,                        removed with a jumbo cold forceps. Resected and                        retrieved.                       - Internal hemorrhoids.                       - The examination was otherwise normal. Recommendation:       - Await pathology results. Scot Junobert T Homer Pfeifer, MD 05/02/2016 11:06:27 AM This report has been signed electronically. Number of Addenda: 0 Note Initiated On: 05/02/2016 10:21 AM Scope Withdrawal Time: 0 hours 12 minutes 4 seconds  Total Procedure Duration: 0 hours 19 minutes 14 seconds       [facility]

## 2016-05-03 ENCOUNTER — Encounter: Payer: Self-pay | Admitting: Unknown Physician Specialty

## 2016-05-04 LAB — SURGICAL PATHOLOGY

## 2016-05-10 ENCOUNTER — Other Ambulatory Visit: Payer: Self-pay | Admitting: Family Medicine

## 2016-05-10 NOTE — Telephone Encounter (Signed)
Received refill request for pramipexole, but this was changed to Requip at visit on 03-17-2016. Please check with patient to see if she wanted to go back to pramipexole.

## 2016-05-12 NOTE — Telephone Encounter (Signed)
I called patient and she states that the Requip has not helped her at all and she has had to go through some terrible nights because the Requip wasn't working. Patient says when she was on the Pramipexole it  helped but she usually had to take more than she was supposed to to get any benefit from it. She would often need a refill before it was time to refill the Pramipexole since she was taking more pills. Patient would like to try something different other than the Requip and Pramipexole that would work better.

## 2016-05-13 NOTE — Telephone Encounter (Signed)
Patient was notified and scheduled ov.

## 2016-05-13 NOTE — Telephone Encounter (Signed)
She will need to come in for o.v. to discuss options

## 2016-05-16 ENCOUNTER — Encounter: Payer: Self-pay | Admitting: Family Medicine

## 2016-05-16 ENCOUNTER — Ambulatory Visit (INDEPENDENT_AMBULATORY_CARE_PROVIDER_SITE_OTHER): Payer: Medicare PPO | Admitting: Family Medicine

## 2016-05-16 VITALS — BP 130/84 | HR 80 | Temp 98.4°F | Resp 16 | Wt 140.0 lb

## 2016-05-16 DIAGNOSIS — G2581 Restless legs syndrome: Secondary | ICD-10-CM

## 2016-05-16 MED ORDER — GABAPENTIN 300 MG PO CAPS: 300.0000 mg | ORAL_CAPSULE | Freq: Every day | ORAL | 3 refills | Status: DC

## 2016-05-16 MED ORDER — PRAMIPEXOLE DIHYDROCHLORIDE 0.125 MG PO TABS: ORAL_TABLET | ORAL | 3 refills | Status: DC

## 2016-05-16 NOTE — Patient Instructions (Signed)
Esophagitis Introduction Esophagitis is inflammation of the esophagus. The esophagus is the tube that carries food and liquids from your mouth to your stomach. Esophagitis can cause soreness or pain in the esophagus. This condition can make it difficult and painful to swallow. What are the causes? Most causes of esophagitis are not serious. Common causes of this condition include:  Gastroesophageal reflux disease (GERD). This is when stomach contents move back up into the esophagus (reflux).  Repeated vomiting.  An allergic-type reaction, especially caused by food allergies (eosinophilic esophagitis).  Injury to the esophagus by swallowing large pills with or without water, or swallowing certain types of medicines.  Swallowing (ingesting) harmful chemicals, such as household cleaning products.  Heavy alcohol use.  An infection of the esophagus.This most often occurs in people who have a weakened immune system.  Radiation or chemotherapy treatment for cancer.  Certain diseases such as sarcoidosis, Crohn disease, and scleroderma. What are the signs or symptoms? Symptoms of this condition include:  Difficult or painful swallowing.  Pain with swallowing acidic liquids, such as citrus juices.  Pain with burping.  Chest pain.  Difficulty breathing.  Nausea.  Vomiting.  Pain in the abdomen.  Weight loss.  Ulcers in the mouth.  Patches of white material in the mouth (candidiasis).  Fever.  Coughing up blood or vomiting blood.  Stool that is black, tarry, or bright red. How is this diagnosed? Your health care provider will take a medical history and perform a physical exam. You may also have other tests, including:  An endoscopy to examine your stomach and esophagus with a small camera.  A test that measures the acidity level in your esophagus.  A test that measures how much pressure is on your esophagus.  A barium swallow or modified barium swallow to show the  shape, size, and functioning of your esophagus.  Allergy tests. How is this treated? Treatment for this condition depends on the cause of your esophagitis. In some cases, steroids or other medicines may be given to help relieve your symptoms or to treat the underlying cause of your condition. You may have to make some lifestyle changes, such as:  Avoiding alcohol.  Quitting smoking.  Changing your diet.  Exercising.  Changing your sleep habits and your sleep environment. Follow these instructions at home: Take these actions to decrease your discomfort and to help avoid complications. Diet  Follow a diet as recommended by your health care provider. This may involve avoiding foods and drinks such as:  Coffee and tea (with or without caffeine).  Drinks that contain alcohol.  Energy drinks and sports drinks.  Carbonated drinks or sodas.  Chocolate and cocoa.  Peppermint and mint flavorings.  Garlic and onions.  Horseradish.  Spicy and acidic foods, including peppers, chili powder, curry powder, vinegar, hot sauces, and barbecue sauce.  Citrus fruit juices and citrus fruits, such as oranges, lemons, and limes.  Tomato-based foods, such as red sauce, chili, salsa, and pizza with red sauce.  Fried and fatty foods, such as donuts, french fries, potato chips, and high-fat dressings.  High-fat meats, such as hot dogs and fatty cuts of red and white meats, such as rib eye steak, sausage, ham, and bacon.  High-fat dairy items, such as whole milk, butter, and cream cheese.  Eat small, frequent meals instead of large meals.  Avoid drinking large amounts of liquid with your meals.  Avoid eating meals during the 2-3 hours before bedtime.  Avoid lying down right after you eat.  Do not exercise right after you eat.  Avoid foods and drinks that seem to make your symptoms worse. General instructions  Pay attention to any changes in your symptoms.  Take over-the-counter and  prescription medicines only as told by your health care provider. Do not take aspirin, ibuprofen, or other NSAIDs unless your health care provider told you to do so.  If you have trouble taking pills, use a pill splitter to decrease the size of the pill. This will decrease the chance of the pill getting stuck or injuring your esophagus on the way down. Also, drink water after you take a pill.  Do not use any tobacco products, including cigarettes, chewing tobacco, and e-cigarettes. If you need help quitting, ask your health care provider.  Wear loose-fitting clothing. Do not wear anything tight around your waist that causes pressure on your abdomen.  Raise (elevate) the head of your bed about 6 inches (15 cm).  Try to reduce your stress, such as with yoga or meditation. If you need help reducing stress, ask your health care provider.  If you are overweight, reduce your weight to an amount that is healthy for you. Ask your health care provider for guidance about a safe weight loss goal.  Keep all follow-up visits as told by your health care provider. This is important. Contact a health care provider if:  You have new symptoms.  You have unexplained weight loss.  You have difficulty swallowing, or it hurts to swallow.  You have wheezing or a persistent cough.  Your symptoms do not improve with treatment.  You have frequent heartburn for more than two weeks. Get help right away if:  You have severe pain in your arms, neck, jaw, teeth, or back.  You feel sweaty, dizzy, or light-headed.  You have chest pain or shortness of breath.  You vomit and your vomit looks like blood or coffee grounds.  Your stool is bloody or black.  You have a fever.  You cannot swallow, drink, or eat. This information is not intended to replace advice given to you by your health care provider. Make sure you discuss any questions you have with your health care provider. Document Released: 07/21/2004  Document Revised: 11/19/2015 Document Reviewed: 10/08/2014  2017 Elsevier       Restless Legs Syndrome Introduction Restless legs syndrome is a condition that causes uncomfortable feelings or sensations in the legs, especially while sitting or lying down. The sensations usually cause an overwhelming urge to move the legs. The arms can also sometimes be affected. The condition can range from mild to severe. The symptoms often interfere with a person's ability to sleep. What are the causes? The cause of this condition is not known. What increases the risk? This condition is more likely to develop in:  People who are older than age 67.  Pregnant women. In general, restless legs syndrome is more common in women than in men.  People who have a family history of the condition.  People who have certain medical conditions, such as iron deficiency, kidney disease, Parkinson disease, or nerve damage.  People who take certain medicines, such as medicines for high blood pressure, nausea, colds, allergies, depression, and some heart conditions. What are the signs or symptoms? The main symptom of this condition is uncomfortable sensations in the legs. These sensations may be:  Described as pulling, tingling, prickling, throbbing, crawling, or burning.  Worse while you are sitting or lying down.  Worse during periods of rest or  inactivity.  Worse at night, often interfering with your sleep.  Accompanied by a very strong urge to move your legs.  Temporarily relieved by movement of your legs. The sensations usually affect both sides of the body. The arms can also be affected, but this is rare. People who have this condition often have tiredness during the day because of their lack of sleep at night. How is this diagnosed? This condition may be diagnosed based on your description of the symptoms. You may also have tests, including blood tests, to check for other conditions that may lead to  your symptoms. In some cases, you may be asked to spend some time in a sleep lab so your sleeping can be monitored. How is this treated? Treatment for this condition is focused on managing the symptoms. Treatment may include:  Self-help and lifestyle changes.  Medicines. Follow these instructions at home:  Take medicines only as directed by your health care provider.  Try these methods to get temporary relief from the uncomfortable sensations:  Massage your legs.  Walk or stretch.  Take a cold or hot bath.  Practice good sleep habits. For example, go to bed and get up at the same time every day.  Exercise regularly.  Practice ways of relaxing, such as yoga or meditation.  Avoid caffeine and alcohol.  Do not use any tobacco products, including cigarettes, chewing tobacco, or electronic cigarettes. If you need help quitting, ask your health care provider.  Keep all follow-up visits as directed by your health care provider. This is important. Contact a health care provider if: Your symptoms do not improve with treatment, or they get worse. This information is not intended to replace advice given to you by your health care provider. Make sure you discuss any questions you have with your health care provider. Document Released: 06/03/2002 Document Revised: 11/19/2015 Document Reviewed: 06/09/2014  2017 Elsevier

## 2016-05-16 NOTE — Progress Notes (Signed)
Patient: Heidi Hernandez Female    DOB: 1951-01-15   65 y.o.   MRN: [id number] Visit Date: 05/16/2016  Today's Provider: Lelon Huh, MD   Chief Complaint  Patient presents with  . Medication Management   Subjective:    Patient is here to discuss changing Requip to another medication dur to it not being effective.   She has long history of RLS initially treated with Mirapex 0.125 BID by another physician. Had been well controlled for years until several months ago when symptoms worsened. We tried change to Requip which she states did not work at all for her. Has gone back to Mirapex but usually having to take three a day to control symptoms, but still having trouble with it at night when she is trying to sleep. We check cbc,  met c and ferritin in July which were all normal. She has rx for lorazepam but she rarely takes it. Does not recall ever taking clonazepam.    Allergies  Allergen Reactions  . Droperidol Other (See Comments)    "Locked jaw" Facial muscles locked  . Morphine Itching and Nausea And Vomiting  . Paxil  [Paroxetine Hcl]     syncope     Current Outpatient Prescriptions:  .  albuterol (VENTOLIN HFA) 108 (90 Base) MCG/ACT inhaler, Inhale 2 puffs into the lungs every 6 (six) hours as needed for wheezing. May substitute generic or equivalent brand name, Disp: 18 g, Rfl: 1 .  alendronate (FOSAMAX) 70 MG tablet, Take 1 tablet (70 mg total) by mouth once a week., Disp: 12 tablet, Rfl: 4 .  atenolol (TENORMIN) 50 MG tablet, TAKE 1 TABLET BY MOUTH ONCE DAILY., Disp: 30 tablet, Rfl: 6 .  azelastine (ASTELIN) 0.1 % nasal spray, Place 1 spray into both nostrils daily., Disp: , Rfl:  .  cholestyramine (QUESTRAN) 4 GM/DOSE powder, Take 1 packet (4 g total) by mouth 2 (two) times daily with a meal., Disp: 378 g, Rfl: 12 .  Fexofenadine HCl (ALLEGRA PO), Take 1 capsule by mouth daily., Disp: , Rfl:  .  fluticasone (FLONASE) 50 MCG/ACT nasal spray, Place 2 sprays into  both nostrils daily. , Disp: , Rfl:  .  ipratropium (ATROVENT) 0.03 % nasal spray, Place 1 spray into both nostrils daily., Disp: , Rfl:  .  LORazepam (ATIVAN) 0.5 MG tablet, Take 1 tablet (0.5 mg total) by mouth every 6 (six) hours as needed for anxiety., Disp: 30 tablet, Rfl: 3 .  montelukast (SINGULAIR) 10 MG tablet, Take 10 mg by mouth at bedtime., Disp: , Rfl:  .  pramipexole (MIRAPEX) 0.125 MG tablet, Take 0.125 mg by mouth 2 (two) times daily at 10 AM and 5 PM., Disp: , Rfl:  .  QVAR 40 MCG/ACT inhaler, Inhale 2 puffs into the lungs 2 (two) times daily., Disp: , Rfl:  .  rOPINIRole (REQUIP XL) 2 MG 24 hr tablet, Take 1 tablet (2 mg total) by mouth at bedtime. Increase to tablets at bedtime after 2 seeks., Disp: 30 tablet, Rfl: 1 .  SUMAtriptan (IMITREX) 100 MG tablet, TAKE 1 TABLET BY MOUTH AT ONSET OF HEADACHE. MAY TAKE AN ADDITIONAL TABLET IN 2 HOURS IF NEEDED. MAX 2 TABS/24hr., Disp: 9 tablet, Rfl: 5 .  Vitamin D, Ergocalciferol, (DRISDOL) 50000 UNITS CAPS capsule, Take 50,000 Units by mouth every 7 (seven) days. Reported on 01/14/2016, Disp: , Rfl:  .  zolpidem (AMBIEN) 10 MG tablet, Take 0.5-1 tablets (5-10 mg total) by mouth at  bedtime as needed for sleep. (Patient not taking: Reported on 05/16/2016), Disp: 30 tablet, Rfl: 2  Review of Systems  Constitutional: Negative for appetite change, chills, fatigue and fever.  Respiratory: Negative for chest tightness and shortness of breath.   Cardiovascular: Negative for chest pain and palpitations.  Gastrointestinal: Negative for abdominal pain, nausea and vomiting.  Neurological: Negative for dizziness and weakness.    Social History  Substance Use Topics  . Smoking status: Former Smoker    Packs/day: 1.00    Years: 10.00    Types: Cigarettes    Quit date: 11/10/1979  . Smokeless tobacco: Never Used  . Alcohol use 0.0 oz/week   Objective:   BP 130/84 (BP Location: Left Arm, Patient Position: Sitting, Cuff Size: Normal)   Pulse 80    Temp 98.4 F (36.9 C) (Oral)   Resp 16   Wt 140 lb (63.5 kg)   SpO2 97%   BMI 24.80 kg/m   Physical Exam   General Appearance:    Alert, cooperative, no distress  Eyes:    PERRL, conjunctiva/corneas clear, EOM's intact       Lungs:     Clear to auscultation bilaterally, respirations unlabored  Heart:    Regular rate and rhythm  Neurologic:   Awake, alert, oriented x 3. No apparent focal neurological           defect.           Assessment & Plan:     1. Restless leg syndrome She reports taking a third mirapex during the day has been helpful which she may continue, and will add gabapentin hs. Also discussed trial of Lyrica if gabapentin is not tolerated or not effective.  - pramipexole (MIRAPEX) 0.125 MG tablet; Take one tablet two to three times daily as needed  Dispense: 90 tablet; Refill: 3 - gabapentin (NEURONTIN) 300 MG capsule; Take 1 capsule (300 mg total) by mouth at bedtime.  Dispense: 30 capsule; Refill: 3        Lelon Huh, MD  [facility]

## 2016-06-03 ENCOUNTER — Encounter: Payer: Self-pay | Admitting: Family Medicine

## 2016-06-03 ENCOUNTER — Ambulatory Visit (INDEPENDENT_AMBULATORY_CARE_PROVIDER_SITE_OTHER): Payer: Medicare PPO | Admitting: Family Medicine

## 2016-06-03 VITALS — BP 146/82 | HR 80 | Temp 98.3°F | Resp 16 | Wt 141.0 lb

## 2016-06-03 DIAGNOSIS — J40 Bronchitis, not specified as acute or chronic: Secondary | ICD-10-CM

## 2016-06-03 DIAGNOSIS — R05 Cough: Secondary | ICD-10-CM | POA: Diagnosis not present

## 2016-06-03 DIAGNOSIS — Q8901 Asplenia (congenital): Secondary | ICD-10-CM | POA: Diagnosis not present

## 2016-06-03 DIAGNOSIS — R059 Cough, unspecified: Secondary | ICD-10-CM

## 2016-06-03 DIAGNOSIS — J0110 Acute frontal sinusitis, unspecified: Secondary | ICD-10-CM

## 2016-06-03 DIAGNOSIS — G2581 Restless legs syndrome: Secondary | ICD-10-CM

## 2016-06-03 MED ORDER — LEVOFLOXACIN 500 MG PO TABS: 500.0000 mg | ORAL_TABLET | Freq: Every day | ORAL | 0 refills | Status: DC

## 2016-06-03 MED ORDER — DAPAGLIFLOZIN PROPANEDIOL 5 MG PO TABS: 5.0000 mg | ORAL_TABLET | Freq: Every day | ORAL | 1 refills | Status: DC

## 2016-06-03 MED ORDER — PRAMIPEXOLE DIHYDROCHLORIDE 0.25 MG PO TABS: ORAL_TABLET | ORAL | 3 refills | Status: DC

## 2016-06-03 MED ORDER — HYDROCOD POLST-CPM POLST ER 10-8 MG/5ML PO SUER: 5.0000 mL | Freq: Every evening | ORAL | 0 refills | Status: DC | PRN

## 2016-06-03 NOTE — Progress Notes (Signed)
Patient: Heidi Hernandez Female    DOB: 02/02/1951   65 y.o.   MRN: [id number] Visit Date: 06/03/2016  Today's Provider: Mila Merryonald Xaria Judon, MD   Chief Complaint  Patient presents with  . Cough   Subjective:    Cough  This is a new problem. The current episode started yesterday. The problem has been gradually worsening. The cough is productive of sputum (thick green-yellow sputum). Associated symptoms include chest pain (when coughing), ear congestion, ear pain (left ear), nasal congestion, postnasal drip, a sore throat and shortness of breath. Pertinent negatives include no chills, fever, headaches, hemoptysis, myalgias, sweats or wheezing.   She is also here to follow up on RLS since starting gabapentin hs about 3 weeks ago. We also reduced dose of pramipexole to 0.125mg  three times a day. She states she is sleeping much better, but restless legs start up as soon as she wakes and is actually worse during the day.      Allergies  Allergen Reactions  . Droperidol Other (See Comments)    "Locked jaw" Facial muscles locked  . Morphine Itching and Nausea And Vomiting  . Paxil  [Paroxetine Hcl]     syncope     Current Outpatient Prescriptions:  .  albuterol (VENTOLIN HFA) 108 (90 Base) MCG/ACT inhaler, Inhale 2 puffs into the lungs every 6 (six) hours as needed for wheezing. May substitute generic or equivalent brand name, Disp: 18 g, Rfl: 1 .  alendronate (FOSAMAX) 70 MG tablet, Take 1 tablet (70 mg total) by mouth once a week., Disp: 12 tablet, Rfl: 4 .  atenolol (TENORMIN) 50 MG tablet, TAKE 1 TABLET BY MOUTH ONCE DAILY., Disp: 30 tablet, Rfl: 6 .  azelastine (ASTELIN) 0.1 % nasal spray, Place 1 spray into both nostrils daily., Disp: , Rfl:  .  cholestyramine (QUESTRAN) 4 GM/DOSE powder, Take 1 packet (4 g total) by mouth 2 (two) times daily with a meal., Disp: 378 g, Rfl: 12 .  Fexofenadine HCl (ALLEGRA PO), Take 1 capsule by mouth daily., Disp: , Rfl:  .  fluticasone  (FLONASE) 50 MCG/ACT nasal spray, Place 2 sprays into both nostrils daily. , Disp: , Rfl:  .  gabapentin (NEURONTIN) 300 MG capsule, Take 1 capsule (300 mg total) by mouth at bedtime., Disp: 30 capsule, Rfl: 3 .  ipratropium (ATROVENT) 0.03 % nasal spray, Place 1 spray into both nostrils daily., Disp: , Rfl:  .  LORazepam (ATIVAN) 0.5 MG tablet, Take 1 tablet (0.5 mg total) by mouth every 6 (six) hours as needed for anxiety., Disp: 30 tablet, Rfl: 3 .  montelukast (SINGULAIR) 10 MG tablet, Take 10 mg by mouth at bedtime., Disp: , Rfl:  .  pramipexole (MIRAPEX) 0.125 MG tablet, Take one tablet two to three times daily as needed, Disp: 90 tablet, Rfl: 3 .  QVAR 40 MCG/ACT inhaler, Inhale 2 puffs into the lungs 2 (two) times daily., Disp: , Rfl:  .  RABEprazole (ACIPHEX) 20 MG tablet, Take by mouth. Take 1 tablet (20 mg total) by mouth 2 (two) times daily. TAKE 30 MIN BEFORE MEALS, Disp: , Rfl:  .  sucralfate (CARAFATE) 1 g tablet, Take by mouth. Take 1 tablet (1 g total) by mouth 4 (four) times daily before meals and nightly, Disp: , Rfl:  .  SUMAtriptan (IMITREX) 100 MG tablet, TAKE 1 TABLET BY MOUTH AT ONSET OF HEADACHE. MAY TAKE AN ADDITIONAL TABLET IN 2 HOURS IF NEEDED. MAX 2 TABS/24hr., Disp: 9 tablet,  Rfl: 5 .  Vitamin D, Ergocalciferol, (DRISDOL) 50000 UNITS CAPS capsule, Take 50,000 Units by mouth every 7 (seven) days. Reported on 01/14/2016, Disp: , Rfl:  .  zolpidem (AMBIEN) 10 MG tablet, Take 0.5-1 tablets (5-10 mg total) by mouth at bedtime as needed for sleep., Disp: 30 tablet, Rfl: 2  Review of Systems  Constitutional: Positive for fatigue. Negative for appetite change, chills and fever.  HENT: Positive for congestion, ear pain (left ear), postnasal drip, sinus pain, sinus pressure, sore throat and voice change. Negative for nosebleeds.   Eyes: Positive for discharge and itching. Negative for pain.  Respiratory: Positive for cough and shortness of breath. Negative for hemoptysis, chest  tightness and wheezing.   Cardiovascular: Positive for chest pain (when coughing). Negative for palpitations.  Gastrointestinal: Negative for abdominal pain, nausea and vomiting.  Musculoskeletal: Negative for myalgias.  Neurological: Negative for dizziness, weakness and headaches.    Social History  Substance Use Topics  . Smoking status: Former Smoker    Packs/day: 1.00    Years: 10.00    Types: Cigarettes    Quit date: 11/10/1979  . Smokeless tobacco: Never Used  . Alcohol use 0.0 oz/week     Comment: occasional   Objective:   BP (!) 146/82 (BP Location: Left Arm, Patient Position: Sitting, Cuff Size: Normal)   Pulse 80   Temp 98.3 F (36.8 C) (Oral)   Resp 16   Wt 141 lb (64 kg)   SpO2 96% Comment: room air  BMI 24.98 kg/m   Physical Exam  General Appearance:    Alert, cooperative, no distress  HENT:   generalized TM normal without fluid or infection, throat normal without erythema or exudate, pharynx erythematous without exudate, frontal and maxilarry sinus tender and nasal mucosa pale and congested  Eyes:    PERRL, conjunctiva/corneas clear, EOM's intact       Lungs:     Clear to auscultation bilaterally, respirations unlabored  Heart:    Regular rate and rhythm  Neurologic:   Awake, alert, oriented x 3. No apparent focal neurological           defect.           Assessment & Plan:     1. Bronchitis She is asplenic with history recurrent pneumonia. Will treat aggressively and she is to call if not rapidly improving.  - levofloxacin (LEVAQUIN) 500 MG tablet; Take 1 tablet (500 mg total) by mouth daily.  Dispense: 7 tablet; Refill: 0  2. Restless leg syndrome Continue gabapentin hs, increase pramipexole  - pramipexole (MIRAPEX) 0.25 MG tablet; Take one tablet two to three times daily as needed  Dispense: 90 tablet; Refill: 3  3. Cough  - chlorpheniramine-HYDROcodone (TUSSIONEX PENNKINETIC ER) 10-8 MG/5ML SUER; Take 5 mLs by mouth at bedtime as needed for cough.   Dispense: 120 mL; Refill: 0  4. Acute non-recurrent frontal sinusitis  - levofloxacin (LEVAQUIN) 500 MG tablet; Take 1 tablet (500 mg total) by mouth daily.  Dispense: 7 tablet; Refill: 0  5. Asplenia        Mila Merryonald Khamia Stambaugh, MD  [facility]

## 2016-06-08 ENCOUNTER — Other Ambulatory Visit: Payer: Self-pay | Admitting: Family Medicine

## 2016-06-08 NOTE — Telephone Encounter (Signed)
Pt states she had an OV on 06/03/16 and has been taking medication as directed. Pt stated she isn't feeling better and will finish her levofloxacin (LEVAQUIN) 500 MG tablet today. Pt wanted to know if she should try another round of medication or try something else. Pharmacy: [facility]. Please advise. Thanks TNP

## 2016-06-08 NOTE — Telephone Encounter (Signed)
Please advise 

## 2016-06-09 NOTE — Telephone Encounter (Signed)
Patient has been advised. KW 

## 2016-06-09 NOTE — Telephone Encounter (Signed)
Pt called back to see if Rx was going to be refill or what else she should try. Pt stated she took her last dose this morning. Please advise. Thanks TNP

## 2016-06-09 NOTE — Telephone Encounter (Signed)
She has viral upper respiratory infection which will take 7-14 days to resolve. The Levaquin was just to make sure it doesn't progress into bacterial pneumonia. There are no antibiotics that will speed up recovery from viral infection.  If she develops fever over 101, shortness of breath, or chest pain then she needs to come in for recheck.

## 2016-06-13 ENCOUNTER — Ambulatory Visit
Admission: RE | Admit: 2016-06-13 | Discharge: 2016-06-13 | Disposition: A | Discharge status: Home / Self Care | Payer: Medicare PPO | Source: Ambulatory Visit | Attending: Family Medicine | Admitting: Family Medicine

## 2016-06-13 ENCOUNTER — Ambulatory Visit (INDEPENDENT_AMBULATORY_CARE_PROVIDER_SITE_OTHER): Payer: Medicare PPO | Admitting: Family Medicine

## 2016-06-13 ENCOUNTER — Telehealth: Payer: Self-pay

## 2016-06-13 ENCOUNTER — Encounter: Payer: Self-pay | Admitting: Family Medicine

## 2016-06-13 VITALS — BP 152/72 | HR 86 | Temp 99.6°F | Resp 18 | Wt 142.0 lb

## 2016-06-13 DIAGNOSIS — Q8901 Asplenia (congenital): Secondary | ICD-10-CM | POA: Diagnosis not present

## 2016-06-13 DIAGNOSIS — R509 Fever, unspecified: Secondary | ICD-10-CM

## 2016-06-13 DIAGNOSIS — R059 Cough, unspecified: Secondary | ICD-10-CM

## 2016-06-13 DIAGNOSIS — R05 Cough: Secondary | ICD-10-CM | POA: Diagnosis not present

## 2016-06-13 DIAGNOSIS — J40 Bronchitis, not specified as acute or chronic: Secondary | ICD-10-CM

## 2016-06-13 DIAGNOSIS — J984 Other disorders of lung: Secondary | ICD-10-CM | POA: Insufficient documentation

## 2016-06-13 LAB — POCT INFLUENZA A/B
INFLUENZA A, POC: NEGATIVE
INFLUENZA B, POC: NEGATIVE

## 2016-06-13 MED ORDER — CEFTRIAXONE SODIUM 1 G IJ SOLR
1.0000 g | Freq: Once | INTRAMUSCULAR | Status: AC
Start: 2016-06-13 — End: 2016-06-13
  Administered 2016-06-13: 1 g via INTRAMUSCULAR

## 2016-06-13 MED ORDER — DOXYCYCLINE HYCLATE 100 MG PO TABS: 100.0000 mg | ORAL_TABLET | Freq: Two times a day (BID) | ORAL | 0 refills | Status: DC

## 2016-06-13 MED ORDER — HYDROCOD POLST-CPM POLST ER 10-8 MG/5ML PO SUER: 5.0000 mL | Freq: Every evening | ORAL | 0 refills | Status: DC | PRN

## 2016-06-13 NOTE — Telephone Encounter (Signed)
Patient advised as below. Patient verbalizes understanding and is in agreement with treatment plan.  

## 2016-06-13 NOTE — Patient Instructions (Signed)
Go to the [facility] on [address] for Chest Xray  

## 2016-06-13 NOTE — Telephone Encounter (Signed)
-----   Message from Malva Limesonald E Fisher, MD sent at 06/13/2016  1:30 PM EST ----- No pneumonia on xrays. Probably just a case of persistent bronchitis. Should be much better by the time she finishes doxycycline.

## 2016-06-13 NOTE — Progress Notes (Signed)
Patient: Heidi Hernandez Female    DOB: 06/17/1951   65 y.o.   MRN: [id number] Visit Date: 06/13/2016  Today's Provider: Mila Merryonald Fisher, MD   Chief Complaint  Patient presents with  . Cough   Subjective:    Cough  Episode onset: 12 days ago. The problem has been gradually worsening (Patient was seen 06/03/2016 for Bronchitis and was treated with Levaquin and Tussionex cough syrup). The cough is productive of sputum (yellow colored phlegm). Associated symptoms include chills, headaches, nasal congestion, postnasal drip, a sore throat, shortness of breath and wheezing. Pertinent negatives include no chest pain, ear congestion, ear pain, fever, hemoptysis, myalgias, rhinorrhea or sweats. She has tried prescription cough suppressant (also oral antibiotics) for the symptoms. The treatment provided mild relief.  Patient describes her cough as a deep cough.      Allergies  Allergen Reactions  . Droperidol Other (See Comments)    "Locked jaw" Facial muscles locked  . Morphine Itching and Nausea And Vomiting  . Paxil  [Paroxetine Hcl]     syncope     Current Outpatient Prescriptions:  .  albuterol (VENTOLIN HFA) 108 (90 Base) MCG/ACT inhaler, Inhale 2 puffs into the lungs every 6 (six) hours as needed for wheezing. May substitute generic or equivalent brand name, Disp: 18 g, Rfl: 1 .  alendronate (FOSAMAX) 70 MG tablet, Take 1 tablet (70 mg total) by mouth once a week., Disp: 12 tablet, Rfl: 4 .  atenolol (TENORMIN) 50 MG tablet, TAKE 1 TABLET BY MOUTH ONCE DAILY., Disp: 30 tablet, Rfl: 6 .  azelastine (ASTELIN) 0.1 % nasal spray, Place 1 spray into both nostrils daily., Disp: , Rfl:  .  chlorpheniramine-HYDROcodone (TUSSIONEX PENNKINETIC ER) 10-8 MG/5ML SUER, Take 5 mLs by mouth at bedtime as needed for cough., Disp: 120 mL, Rfl: 0 .  cholestyramine (QUESTRAN) 4 GM/DOSE powder, Take 1 packet (4 g total) by mouth 2 (two) times daily with a meal., Disp: 378 g, Rfl: 12 .   Fexofenadine HCl (ALLEGRA PO), Take 1 capsule by mouth daily., Disp: , Rfl:  .  fluticasone (FLONASE) 50 MCG/ACT nasal spray, Place 2 sprays into both nostrils daily. , Disp: , Rfl:  .  gabapentin (NEURONTIN) 300 MG capsule, Take 1 capsule (300 mg total) by mouth at bedtime., Disp: 30 capsule, Rfl: 3 .  ipratropium (ATROVENT) 0.03 % nasal spray, Place 1 spray into both nostrils daily., Disp: , Rfl:  .  levofloxacin (LEVAQUIN) 500 MG tablet, Take 1 tablet (500 mg total) by mouth daily., Disp: 7 tablet, Rfl: 0 .  LORazepam (ATIVAN) 0.5 MG tablet, Take 1 tablet (0.5 mg total) by mouth every 6 (six) hours as needed for anxiety., Disp: 30 tablet, Rfl: 3 .  montelukast (SINGULAIR) 10 MG tablet, Take 10 mg by mouth at bedtime., Disp: , Rfl:  .  pramipexole (MIRAPEX) 0.25 MG tablet, Take one tablet two to three times daily as needed, Disp: 90 tablet, Rfl: 3 .  QVAR 40 MCG/ACT inhaler, Inhale 2 puffs into the lungs 2 (two) times daily., Disp: , Rfl:  .  RABEprazole (ACIPHEX) 20 MG tablet, Take by mouth. Take 1 tablet (20 mg total) by mouth 2 (two) times daily. TAKE 30 MIN BEFORE MEALS, Disp: , Rfl:  .  sucralfate (CARAFATE) 1 g tablet, Take by mouth. Take 1 tablet (1 g total) by mouth 4 (four) times daily before meals and nightly, Disp: , Rfl:  .  SUMAtriptan (IMITREX) 100 MG tablet, TAKE  1 TABLET BY MOUTH AT ONSET OF HEADACHE. MAY TAKE AN ADDITIONAL TABLET IN 2 HOURS IF NEEDED. MAX 2 TABS/24hr., Disp: 9 tablet, Rfl: 5 .  Vitamin D, Ergocalciferol, (DRISDOL) 50000 UNITS CAPS capsule, Take 50,000 Units by mouth every 7 (seven) days. Reported on 01/14/2016, Disp: , Rfl:  .  zolpidem (AMBIEN) 10 MG tablet, Take 0.5-1 tablets (5-10 mg total) by mouth at bedtime as needed for sleep., Disp: 30 tablet, Rfl: 2  Review of Systems  Constitutional: Positive for chills and fatigue. Negative for appetite change and fever.  HENT: Positive for congestion, postnasal drip and sore throat. Negative for ear pain and rhinorrhea.    Respiratory: Positive for cough, shortness of breath and wheezing. Negative for hemoptysis and chest tightness.   Cardiovascular: Negative for chest pain and palpitations.  Gastrointestinal: Negative for abdominal pain, nausea and vomiting.  Musculoskeletal: Negative for myalgias.  Neurological: Positive for headaches. Negative for dizziness and weakness.    Social History  Substance Use Topics  . Smoking status: Former Smoker    Packs/day: 1.00    Years: 10.00    Types: Cigarettes    Quit date: 11/10/1979  . Smokeless tobacco: Never Used  . Alcohol use 0.0 oz/week     Comment: occasional   Objective:   BP (!) 152/72 (BP Location: Left Arm, Patient Position: Sitting, Cuff Size: Normal)   Pulse 86   Temp 99.6 F (37.6 C) (Oral)   Resp 18   Wt 142 lb (64.4 kg)   SpO2 97% Comment: room air  BMI 25.15 kg/m   Physical Exam  General Appearance:    Alert, cooperative, no distress  HENT:   bilateral TM normal without fluid or infection, neck without nodes, sinuses nontender and nasal mucosa congested  Eyes:    PERRL, conjunctiva/corneas clear, EOM's intact       Lungs:     Clear to auscultation bilaterally, respirations unlabored  Heart:    Regular rate and rhythm  Neurologic:   Awake, alert, oriented x 3. No apparent focal neurological           defect.       Results for orders placed or performed in visit on 06/13/16  POCT Influenza A/B  Result Value Ref Range   Influenza A, POC Negative Negative   Influenza B, POC Negative Negative     Dg Chest 2 View  Result Date: 06/13/2016 CLINICAL DATA:  Cough.  Low-grade fever .  EXAM: CHEST  2 VIEW COMPARISON:  07/16/2015 .  06/16/2015.  FINDINGS: Mediastinum and hilar structures are normal. Lungs are clear of acute infiltrates. Bilateral pleuroparenchymal thickening noted consistent with scarring . Stable mild lower thoracic vertebral body compression fracture. Surgical clips upper abdomen.  IMPRESSION: Bilateral  pleural-parenchymal thickening noted consistent with scarring. No acute pulmonary disease. Electronically Signed   By: Maisie Fushomas  Register   On: 06/13/2016 11:06       Assessment & Plan:     1. Fever, unspecified fever cause  - POCT Influenza A/B - DG Chest 2 View; Future  2. Bronchitis Counseled that she has likely been exposed to multiple respiratory viruses, considering her history of recurrent pneumonia and asplenia will treat aggressively.  - cefTRIAXone (ROCEPHIN) injection 1 g; Inject 1 g into the muscle once.  3. Cough  - chlorpheniramine-HYDROcodone (TUSSIONEX PENNKINETIC ER) 10-8 MG/5ML SUER; Take 5 mLs by mouth at bedtime as needed for cough.  Dispense: 120 mL; Refill: 0 - DG Chest 2 View; Future  4. Asplenia  The entirety of the information documented in the History of Present Illness, Review of Systems and Physical Exam were personally obtained by me. Portions of this information were initially documented by Meyer Cory, CMA and reviewed by me for thoroughness and accuracy.    Lelon Huh, MD  [facility]

## 2016-07-04 ENCOUNTER — Telehealth: Payer: Self-pay | Admitting: Family Medicine

## 2016-07-04 NOTE — Telephone Encounter (Signed)
Please advise. Heidi Hernandez, CMA  

## 2016-07-04 NOTE — Telephone Encounter (Signed)
Pt stated that she finished taking the doxycycline (VIBRA-TABS) 100 MG tablet around Christmas and yesterday 07/03/16 she started running a fever again. Pt stated that she has a lot of sinus congestion and producing thick yellowish green mucus. Pt stated that she thinks she has a sinus infection on top of the cough she was last seen for on 06/13/16. [facility]. Pt stated that if she needs to be seen again she would like to be worked in today if possible. Please advise. Thanks TNP

## 2016-07-05 MED ORDER — DOXYCYCLINE HYCLATE 100 MG PO TABS: 100.0000 mg | ORAL_TABLET | Freq: Two times a day (BID) | ORAL | 0 refills | Status: AC

## 2016-07-05 NOTE — Telephone Encounter (Signed)
Advised patient as below.  

## 2016-07-05 NOTE — Telephone Encounter (Signed)
Have sent refill for doxycyline to [facility].

## 2016-07-08 ENCOUNTER — Encounter: Payer: Self-pay | Admitting: Physician Assistant

## 2016-07-08 ENCOUNTER — Ambulatory Visit (INDEPENDENT_AMBULATORY_CARE_PROVIDER_SITE_OTHER): Payer: Medicare PPO | Admitting: Physician Assistant

## 2016-07-08 VITALS — BP 148/68 | HR 88 | Temp 99.3°F | Resp 16 | Wt 144.0 lb

## 2016-07-08 DIAGNOSIS — Q8901 Asplenia (congenital): Secondary | ICD-10-CM

## 2016-07-08 DIAGNOSIS — R509 Fever, unspecified: Secondary | ICD-10-CM

## 2016-07-08 MED ORDER — OSELTAMIVIR PHOSPHATE 75 MG PO CAPS: 75.0000 mg | ORAL_CAPSULE | Freq: Two times a day (BID) | ORAL | 0 refills | Status: AC

## 2016-07-08 NOTE — Patient Instructions (Signed)

## 2016-07-08 NOTE — Progress Notes (Signed)
Patient: Heidi Hernandez Female    DOB: May 24, 1951   66 y.o.   MRN: [id number] Visit Date: 07/08/2016  Today's Provider: Trey Sailors, PA-C   Chief Complaint  Patient presents with  . Headache    Started last night.    Subjective:    Headache   This is a new problem. The current episode started yesterday. The pain quality is not similar to prior headaches (Has had migraines in the past but this feels different. ). The quality of the pain is described as aching and dull. The pain is at a severity of 10/10. Associated symptoms include coughing, nausea, rhinorrhea, sinus pressure and a sore throat. Pertinent negatives include no abdominal pain, dizziness, ear pain, numbness or vomiting.  Sinusitis  This is a chronic problem. The current episode started in the past 7 days. The problem is unchanged (Pt recently started another round of Doxycycline a few days ago.). Associated symptoms include chills, congestion, coughing, headaches, shortness of breath, sinus pressure, sneezing and a sore throat. Pertinent negatives include no ear pain.   Patient is a 66 y/o female with a history of asplenia and pneumonia presenting today with URI symptoms. She has been seen in the clinic on 06/03/2016 for bronchitis. She was prescribed a 7 day course of Levaquin. She was seen again on 06/13/2016 for fever and was given 1g IM Rocephin in the clinic and 10 days of doxycycline. Most recently, on 07/04/2016 she had a refill of doxycycline for presumed sinusitis. She is four days into this treatment. She does feel a continuation of sinus symptoms like sinus congestion, purulent nasal secretions, and headaches. She also feels new symptoms that remind her of when she had the flu. Her headache has not resolved with Imitrex or a hydrocodone. It's not the worst headache of her life but it is persistent. She does not have any visual changes or neurological symptoms. She is nausea but no vomiting or diarrhea. She has not  had her flu shot this year and neither has her husband.    Allergies  Allergen Reactions  . Droperidol Other (See Comments)    "Locked jaw" Facial muscles locked  . Morphine Itching and Nausea And Vomiting  . Paxil  [Paroxetine Hcl]     syncope     Current Outpatient Prescriptions:  .  albuterol (VENTOLIN HFA) 108 (90 Base) MCG/ACT inhaler, Inhale 2 puffs into the lungs every 6 (six) hours as needed for wheezing. May substitute generic or equivalent brand name, Disp: 18 g, Rfl: 1 .  alendronate (FOSAMAX) 70 MG tablet, Take 1 tablet (70 mg total) by mouth once a week., Disp: 12 tablet, Rfl: 4 .  atenolol (TENORMIN) 50 MG tablet, TAKE 1 TABLET BY MOUTH ONCE DAILY., Disp: 30 tablet, Rfl: 6 .  azelastine (ASTELIN) 0.1 % nasal spray, Place 1 spray into both nostrils daily., Disp: , Rfl:  .  chlorpheniramine-HYDROcodone (TUSSIONEX PENNKINETIC ER) 10-8 MG/5ML SUER, Take 5 mLs by mouth at bedtime as needed for cough., Disp: 120 mL, Rfl: 0 .  cholestyramine (QUESTRAN) 4 GM/DOSE powder, Take 1 packet (4 g total) by mouth 2 (two) times daily with a meal., Disp: 378 g, Rfl: 12 .  doxycycline (VIBRA-TABS) 100 MG tablet, Take 1 tablet (100 mg total) by mouth 2 (two) times daily., Disp: 14 tablet, Rfl: 0 .  Fexofenadine HCl (ALLEGRA PO), Take 1 capsule by mouth daily., Disp: , Rfl:  .  fluticasone (FLONASE) 50 MCG/ACT nasal spray,  Place 2 sprays into both nostrils daily. , Disp: , Rfl:  .  ipratropium (ATROVENT) 0.03 % nasal spray, Place 1 spray into both nostrils daily., Disp: , Rfl:  .  LORazepam (ATIVAN) 0.5 MG tablet, Take 1 tablet (0.5 mg total) by mouth every 6 (six) hours as needed for anxiety., Disp: 30 tablet, Rfl: 3 .  montelukast (SINGULAIR) 10 MG tablet, Take 10 mg by mouth at bedtime., Disp: , Rfl:  .  pramipexole (MIRAPEX) 0.25 MG tablet, Take one tablet two to three times daily as needed, Disp: 90 tablet, Rfl: 3 .  QVAR 40 MCG/ACT inhaler, Inhale 2 puffs into the lungs 2 (two) times  daily., Disp: , Rfl:  .  RABEprazole (ACIPHEX) 20 MG tablet, Take by mouth. Take 1 tablet (20 mg total) by mouth 2 (two) times daily. TAKE 30 MIN BEFORE MEALS, Disp: , Rfl:  .  sucralfate (CARAFATE) 1 g tablet, Take by mouth. Take 1 tablet (1 g total) by mouth 4 (four) times daily before meals and nightly, Disp: , Rfl:  .  SUMAtriptan (IMITREX) 100 MG tablet, TAKE 1 TABLET BY MOUTH AT ONSET OF HEADACHE. MAY TAKE AN ADDITIONAL TABLET IN 2 HOURS IF NEEDED. MAX 2 TABS/24hr., Disp: 9 tablet, Rfl: 5 .  Vitamin D, Ergocalciferol, (DRISDOL) 50000 UNITS CAPS capsule, Take 50,000 Units by mouth every 7 (seven) days. Reported on 01/14/2016, Disp: , Rfl:  .  gabapentin (NEURONTIN) 300 MG capsule, Take 1 capsule (300 mg total) by mouth at bedtime. (Patient not taking: Reported on 07/08/2016), Disp: 30 capsule, Rfl: 3 .  zolpidem (AMBIEN) 10 MG tablet, Take 0.5-1 tablets (5-10 mg total) by mouth at bedtime as needed for sleep. (Patient not taking: Reported on 07/08/2016), Disp: 30 tablet, Rfl: 2  Review of Systems  Constitutional: Positive for chills and fatigue.  HENT: Positive for congestion, nosebleeds, postnasal drip, rhinorrhea, sinus pain, sinus pressure, sneezing and sore throat. Negative for ear discharge and ear pain.   Eyes: Positive for discharge and itching.  Respiratory: Positive for cough, shortness of breath and wheezing. Negative for apnea, choking, chest tightness and stridor.   Gastrointestinal: Positive for nausea. Negative for abdominal distention, abdominal pain, anal bleeding, blood in stool, constipation, diarrhea, rectal pain and vomiting.  Musculoskeletal: Negative for myalgias.  Neurological: Positive for headaches. Negative for dizziness and numbness.    Social History  Substance Use Topics  . Smoking status: Former Smoker    Packs/day: 1.00    Years: 10.00    Types: Cigarettes    Quit date: 11/10/1979  . Smokeless tobacco: Never Used  . Alcohol use 0.0 oz/week     Comment:  occasional   Objective:   BP (!) 148/68 (BP Location: Left Arm, Patient Position: Sitting, Cuff Size: Normal)   Pulse 88   Temp 99.3 F (37.4 C) (Oral)   Resp 16   Wt 144 lb (65.3 kg)   BMI 25.51 kg/m   Physical Exam  Constitutional: She is oriented to person, place, and time. She appears well-developed and well-nourished. No distress.  HENT:  Right Ear: External ear normal.  Left Ear: External ear normal.  Nose: Right sinus exhibits maxillary sinus tenderness and frontal sinus tenderness. Left sinus exhibits maxillary sinus tenderness and frontal sinus tenderness.  Mouth/Throat: Oropharynx is clear and moist. No oropharyngeal exudate, posterior oropharyngeal edema or posterior oropharyngeal erythema.  Tms opaque bilaterally   Eyes: Conjunctivae are normal. Right eye exhibits no discharge. Left eye exhibits no discharge.  Neck: Neck supple.  Cardiovascular: Normal rate and regular rhythm.   Pulmonary/Chest: Effort normal and breath sounds normal.  Lymphadenopathy:    She has no cervical adenopathy.  Neurological: She is alert and oriented to person, place, and time.  Skin: Skin is warm and dry. She is not diaphoretic.  Psychiatric: She has a normal mood and affect. Her behavior is normal.        Assessment & Plan:     1. Elevated temperature  Our clinic ran out of flu swabs today, so no available testing for patient. We will treat empirically for the flu as below, especially considering her asplenia and non-vaccination status. Patient should continue her doxycycline for her sinus congestion. She has been advised of the half day clinic tomorrow morning if she feels she needs to be seen. Return precautions counseled.  - oseltamivir (TAMIFLU) 75 MG capsule; Take 1 capsule (75 mg total) by mouth 2 (two) times daily.  Dispense: 10 capsule; Refill: 0  2. Asplenia  See above.  - oseltamivir (TAMIFLU) 75 MG capsule; Take 1 capsule (75 mg total) by mouth 2 (two) times daily.   Dispense: 10 capsule; Refill: 0  Return if symptoms worsen or fail to improve.  The entirety of the information documented in the History of Present Illness, Review of Systems and Physical Exam were personally obtained by me. Portions of this information were initially documented by Kavin Leech, CMA and reviewed by me for thoroughness and accuracy.    Patient Instructions  Influenza, Adult Influenza ("the flu") is an infection in the lungs, nose, and throat (respiratory tract). It is caused by a virus. The flu causes many common cold symptoms, as well as a high fever and body aches. It can make you feel very sick. The flu spreads easily from person to person (is contagious). Getting a flu shot (influenza vaccination) every year is the best way to prevent the flu. Follow these instructions at home:  Take over-the-counter and prescription medicines only as told by your doctor.  Use a cool mist humidifier to add moisture (humidity) to the air in your home. This can make it easier to breathe.  Rest as needed.  Drink enough fluid to keep your pee (urine) clear or pale yellow.  Cover your mouth and nose when you cough or sneeze.  Wash your hands with soap and water often, especially after you cough or sneeze. If you cannot use soap and water, use hand sanitizer.  Stay home from work or school as told by your doctor. Unless you are visiting your doctor, try to avoid leaving home until your fever has been gone for 24 hours without the use of medicine.  Keep all follow-up visits as told by your doctor. This is important. How is this prevented?  Getting a yearly (annual) flu shot is the best way to avoid getting the flu. You may get the flu shot in late summer, fall, or winter. Ask your doctor when you should get your flu shot.  Wash your hands often or use hand sanitizer often.  Avoid contact with people who are sick during cold and flu season.  Eat healthy foods.  Drink plenty of  fluids.  Get enough sleep.  Exercise regularly. Contact a doctor if:  You get new symptoms.  You have:  Chest pain.  Watery poop (diarrhea).  A fever.  Your cough gets worse.  You start to have more mucus.  You feel sick to your stomach (nauseous).  You throw up (vomit). Get help  right away if:  You start to be short of breath or have trouble breathing.  Your skin or nails turn a bluish color.  You have very bad pain or stiffness in your neck.  You get a sudden headache.  You get sudden pain in your face or ear.  You cannot stop throwing up. This information is not intended to replace advice given to you by your health care provider. Make sure you discuss any questions you have with your health care provider. Document Released: 03/22/2008 Document Revised: 11/19/2015 Document Reviewed: 04/07/2015 Elsevier Interactive Patient Education  2017 Elsevier Inc.         Trey SailorsAdriana M Pollak, PA-C  [facility]

## 2016-07-28 ENCOUNTER — Telehealth: Payer: Self-pay | Admitting: Family Medicine

## 2016-07-28 DIAGNOSIS — G2581 Restless legs syndrome: Secondary | ICD-10-CM

## 2016-07-28 MED ORDER — PRAMIPEXOLE DIHYDROCHLORIDE 0.25 MG PO TABS: 0.2500 mg | ORAL_TABLET | Freq: Two times a day (BID) | ORAL | 3 refills | Status: DC

## 2016-07-28 NOTE — Telephone Encounter (Signed)
Pt contacted office for refill request on the following medications:  pramipexole (MIRAPEX) 0.25 MG tablet.  Pt is requesting this changed to 1 mg 2 times a day.  Pt states she is going to stop taking the gabapentin (NEURONTIN) 300 MG capsule.  Pt also request a 90 day supply.  [facility].  YN#[phone]/MVCB#[phone]/MW

## 2016-07-28 NOTE — Telephone Encounter (Signed)
Please advise 

## 2016-07-28 NOTE — Telephone Encounter (Signed)
done

## 2016-08-08 ENCOUNTER — Telehealth: Payer: Self-pay

## 2016-08-08 DIAGNOSIS — G2581 Restless legs syndrome: Secondary | ICD-10-CM

## 2016-08-08 MED ORDER — PRAMIPEXOLE DIHYDROCHLORIDE 1 MG PO TABS: 1.0000 mg | ORAL_TABLET | Freq: Two times a day (BID) | ORAL | 3 refills | Status: DC

## 2016-08-08 NOTE — Telephone Encounter (Signed)
Patient reports that she was supposed to get the Mirapex 1mg  two times a day sent into the pharmacy, and she reports that she is still getting the 0.25mg  tablet. She reports that she is having to take 8 tablets to equal 1mg , and it seems to help with her symptoms. Patient is requesting that this be resent into the pharmacy for the correct dosage. Patient uses [facility]. Contact info is correct. Thanks!

## 2016-08-08 NOTE — Telephone Encounter (Signed)
Please advise 

## 2016-08-24 ENCOUNTER — Telehealth: Payer: Self-pay | Admitting: Family Medicine

## 2016-08-24 NOTE — Telephone Encounter (Signed)
Called Pt to schedule AWV with [facility] - knb °

## 2016-09-08 ENCOUNTER — Ambulatory Visit (INDEPENDENT_AMBULATORY_CARE_PROVIDER_SITE_OTHER): Payer: Medicare PPO | Admitting: Physician Assistant

## 2016-09-08 ENCOUNTER — Encounter: Payer: Self-pay | Admitting: Physician Assistant

## 2016-09-08 VITALS — BP 160/88 | HR 67 | Temp 98.1°F | Resp 16 | Wt 144.2 lb

## 2016-09-08 DIAGNOSIS — R05 Cough: Secondary | ICD-10-CM | POA: Diagnosis not present

## 2016-09-08 DIAGNOSIS — J0140 Acute pansinusitis, unspecified: Secondary | ICD-10-CM | POA: Diagnosis not present

## 2016-09-08 DIAGNOSIS — R059 Cough, unspecified: Secondary | ICD-10-CM

## 2016-09-08 MED ORDER — DOXYCYCLINE HYCLATE 100 MG PO TABS: 100.0000 mg | ORAL_TABLET | Freq: Two times a day (BID) | ORAL | 0 refills | Status: DC

## 2016-09-08 MED ORDER — HYDROCOD POLST-CPM POLST ER 10-8 MG/5ML PO SUER: 5.0000 mL | Freq: Every evening | ORAL | 0 refills | Status: DC | PRN

## 2016-09-08 NOTE — Progress Notes (Signed)
Patient: Heidi Hernandez Female    DOB: 09/23/1950   66 y.o.   MRN: [id number] Visit Date: 09/08/2016  Today's Provider: Margaretann Loveless, PA-C   Chief Complaint  Patient presents with  . URI   Subjective:    URI   This is a new problem. The current episode started yesterday. The problem has been gradually worsening. There has been no fever. Associated symptoms include congestion, coughing, headaches, a plugged ear sensation, sneezing, a sore throat and wheezing. Pertinent negatives include no abdominal pain, chest pain, ear pain, nausea or rhinorrhea. Associated symptoms comments: She gets allergy shots because she suffers from allergies a lot. She has tried antihistamine and increased fluids (Qvar daily and zyrtec) for the symptoms. The treatment provided no relief.   Patient is asplenia.     Allergies  Allergen Reactions  . Droperidol Other (See Comments)    "Locked jaw" Facial muscles locked  . Morphine Itching and Nausea And Vomiting  . Paxil  [Paroxetine Hcl]     syncope     Current Outpatient Prescriptions:  .  atenolol (TENORMIN) 50 MG tablet, TAKE 1 TABLET BY MOUTH ONCE DAILY., Disp: 30 tablet, Rfl: 6 .  azelastine (ASTELIN) 0.1 % nasal spray, Place 1 spray into both nostrils daily., Disp: , Rfl:  .  cholestyramine (QUESTRAN) 4 GM/DOSE powder, Take 1 packet (4 g total) by mouth 2 (two) times daily with a meal., Disp: 378 g, Rfl: 12 .  fluticasone (FLONASE) 50 MCG/ACT nasal spray, Place 2 sprays into both nostrils daily. , Disp: , Rfl:  .  montelukast (SINGULAIR) 10 MG tablet, Take 10 mg by mouth at bedtime., Disp: , Rfl:  .  pramipexole (MIRAPEX) 1 MG tablet, Take 1 tablet (1 mg total) by mouth 2 (two) times daily., Disp: 180 tablet, Rfl: 3 .  QVAR 40 MCG/ACT inhaler, Inhale 2 puffs into the lungs 2 (two) times daily., Disp: , Rfl:  .  RABEprazole (ACIPHEX) 20 MG tablet, Take by mouth. Take 1 tablet (20 mg total) by mouth 2 (two) times daily. TAKE 30 MIN  BEFORE MEALS, Disp: , Rfl:  .  sucralfate (CARAFATE) 1 g tablet, Take by mouth. Take 1 tablet (1 g total) by mouth 4 (four) times daily before meals and nightly, Disp: , Rfl:  .  SUMAtriptan (IMITREX) 100 MG tablet, TAKE 1 TABLET BY MOUTH AT ONSET OF HEADACHE. MAY TAKE AN ADDITIONAL TABLET IN 2 HOURS IF NEEDED. MAX 2 TABS/24hr., Disp: 9 tablet, Rfl: 5 .  albuterol (VENTOLIN HFA) 108 (90 Base) MCG/ACT inhaler, Inhale 2 puffs into the lungs every 6 (six) hours as needed for wheezing. May substitute generic or equivalent brand name (Patient not taking: Reported on 09/08/2016), Disp: 18 g, Rfl: 1 .  alendronate (FOSAMAX) 70 MG tablet, Take 1 tablet (70 mg total) by mouth once a week., Disp: 12 tablet, Rfl: 4 .  chlorpheniramine-HYDROcodone (TUSSIONEX PENNKINETIC ER) 10-8 MG/5ML SUER, Take 5 mLs by mouth at bedtime as needed for cough. (Patient not taking: Reported on 09/08/2016), Disp: 120 mL, Rfl: 0 .  Fexofenadine HCl (ALLEGRA PO), Take 1 capsule by mouth daily., Disp: , Rfl:  .  ipratropium (ATROVENT) 0.03 % nasal spray, Place 1 spray into both nostrils daily., Disp: , Rfl:  .  LORazepam (ATIVAN) 0.5 MG tablet, Take 1 tablet (0.5 mg total) by mouth every 6 (six) hours as needed for anxiety. (Patient not taking: Reported on 09/08/2016), Disp: 30 tablet, Rfl: 3 .  Vitamin D, Ergocalciferol, (DRISDOL) 50000 UNITS CAPS capsule, Take 50,000 Units by mouth every 7 (seven) days. Reported on 01/14/2016, Disp: , Rfl:  .  zolpidem (AMBIEN) 10 MG tablet, Take 0.5-1 tablets (5-10 mg total) by mouth at bedtime as needed for sleep. (Patient not taking: Reported on 07/08/2016), Disp: 30 tablet, Rfl: 2  Review of Systems  Constitutional: Negative for chills and fever.  HENT: Positive for congestion, postnasal drip (since three days), sinus pressure, sneezing, sore throat and voice change. Negative for ear pain, rhinorrhea and trouble swallowing.   Respiratory: Positive for cough and wheezing. Negative for chest tightness.    Cardiovascular: Negative for chest pain, palpitations and leg swelling.  Gastrointestinal: Negative for abdominal pain and nausea.  Neurological: Positive for headaches. Negative for dizziness.    Social History  Substance Use Topics  . Smoking status: Former Smoker    Packs/day: 1.00    Years: 10.00    Types: Cigarettes    Quit date: 11/10/1979  . Smokeless tobacco: Never Used  . Alcohol use 0.0 oz/week     Comment: occasional   Objective:   BP (!) 160/88 (BP Location: Left Arm, Patient Position: Sitting, Cuff Size: Normal) Comment: She reports not taking the Atenolol this AM  Pulse 67   Temp 98.1 F (36.7 C) (Oral)   Resp 16   Wt 144 lb 3.2 oz (65.4 kg)   BMI 25.54 kg/m    Physical Exam  Constitutional: She appears well-developed and well-nourished. No distress.  HENT:  Head: Normocephalic and atraumatic.  Right Ear: Hearing, tympanic membrane, external ear and ear canal normal.  Left Ear: Hearing, tympanic membrane, external ear and ear canal normal.  Nose: Mucosal edema and rhinorrhea present. Right sinus exhibits maxillary sinus tenderness and frontal sinus tenderness. Left sinus exhibits maxillary sinus tenderness and frontal sinus tenderness.  Mouth/Throat: Uvula is midline and mucous membranes are normal. Posterior oropharyngeal erythema present. No oropharyngeal exudate or posterior oropharyngeal edema.  Neck: Normal range of motion. Neck supple. No tracheal deviation present. No thyromegaly present.  Cardiovascular: Normal rate, regular rhythm and normal heart sounds.  Exam reveals no gallop and no friction rub.   No murmur heard. Pulmonary/Chest: Effort normal and breath sounds normal. No stridor. No respiratory distress. She has no wheezes. She has no rales.  Lymphadenopathy:    She has cervical adenopathy.       Left cervical: Superficial cervical adenopathy present.  Skin: She is not diaphoretic.  Vitals reviewed.     Assessment & Plan:     1. Acute  pansinusitis, recurrence not specified Worsening symptoms that have not responded to OTC medications. Will give augmentin as below. Continue allergy medications. Stay well hydrated and get plenty of rest. Call if no symptom improvement or if symptoms worsen. - doxycycline (VIBRA-TABS) 100 MG tablet; Take 1 tablet (100 mg total) by mouth 2 (two) times daily.  Dispense: 20 tablet; Refill: 0  2. Cough Worsening symptoms that has not responded to OTC medications. Will give Tussionex cough syrup as below for nighttime cough. Drowsiness precautions given to patient. Stay well hydrated. Use delsym, robitussin OR mucinex for daytime cough. - chlorpheniramine-HYDROcodone (TUSSIONEX PENNKINETIC ER) 10-8 MG/5ML SUER; Take 5 mLs by mouth at bedtime as needed for cough.  Dispense: 120 mL; Refill: 0       Margaretann LovelessJennifer M Burnette, PA-C  [facility]

## 2016-09-08 NOTE — Patient Instructions (Signed)

## 2016-09-19 ENCOUNTER — Telehealth: Payer: Self-pay | Admitting: Family Medicine

## 2016-09-19 DIAGNOSIS — J0140 Acute pansinusitis, unspecified: Secondary | ICD-10-CM

## 2016-09-19 MED ORDER — DOXYCYCLINE HYCLATE 100 MG PO TABS: 100.0000 mg | ORAL_TABLET | Freq: Two times a day (BID) | ORAL | 0 refills | Status: DC

## 2016-09-19 NOTE — Telephone Encounter (Signed)
Refill sent.

## 2016-09-19 NOTE — Telephone Encounter (Signed)
Patient advised as below.  

## 2016-09-19 NOTE — Telephone Encounter (Signed)
Pt contacted office for refill request on the following medications:  doxycycline (VIBRA-TABS) 100 MG tablet.  [facility].  CB#[phone]/MW  Pt is requesting a refill due to she still has a cough and congestion/MW

## 2016-10-11 ENCOUNTER — Other Ambulatory Visit: Payer: Self-pay | Admitting: Family Medicine

## 2016-10-19 ENCOUNTER — Ambulatory Visit (INDEPENDENT_AMBULATORY_CARE_PROVIDER_SITE_OTHER): Payer: Medicare PPO | Admitting: Family Medicine

## 2016-10-19 DIAGNOSIS — Z23 Encounter for immunization: Secondary | ICD-10-CM | POA: Diagnosis not present

## 2016-10-19 MED ORDER — HAEMOPHILUS B POLYSAC CONJ VAC 7.5 MCG/0.5 ML IM SUSP: 0.5000 mL | Freq: Once | INTRAMUSCULAR | Status: DC

## 2016-10-19 NOTE — Progress Notes (Signed)
Vaccines only, no MD visit.

## 2016-10-26 ENCOUNTER — Ambulatory Visit (INDEPENDENT_AMBULATORY_CARE_PROVIDER_SITE_OTHER): Payer: Medicare PPO | Admitting: Family Medicine

## 2016-10-26 ENCOUNTER — Ambulatory Visit: Payer: Self-pay | Admitting: Family Medicine

## 2016-10-26 ENCOUNTER — Encounter: Payer: Self-pay | Admitting: Family Medicine

## 2016-10-26 VITALS — BP 122/60 | HR 59 | Temp 98.2°F | Resp 16 | Wt 145.0 lb

## 2016-10-26 DIAGNOSIS — T8090XA Unspecified complication following infusion and therapeutic injection, initial encounter: Secondary | ICD-10-CM

## 2016-10-26 DIAGNOSIS — T50A95S Adverse effect of other bacterial vaccines, sequela: Secondary | ICD-10-CM

## 2016-10-26 MED ORDER — CYCLOBENZAPRINE HCL 5 MG PO TABS: 5.0000 mg | ORAL_TABLET | Freq: Every day | ORAL | 0 refills | Status: AC

## 2016-10-26 NOTE — Patient Instructions (Signed)
   Take OTC ibuprofen, 2 tablets every four hours during the day

## 2016-10-26 NOTE — Progress Notes (Signed)
Patient: Heidi Hernandez Female    DOB: 1951/03/08   66 y.o.   MRN: [id number] Visit Date: 10/26/2016  Today's Provider: [name], MD   Chief Complaint  Patient presents with  . Arm Swelling    x 1 week   Subjective:    HPI Swelling of the right arm:  Patient come in today with swelling of the right upper arm. She a little bit or soreness the first few days after receiving several vaccines on 4/25, but over the last 3 days has started having swelling and much more soreness around injection site. Is difficult to abduct arm due to pain. Has had no redness. Has not applied any heat or ice.     Allergies  Allergen Reactions  . Droperidol Other (See Comments)    "Locked jaw" Facial muscles locked  . Morphine Itching and Nausea And Vomiting  . Paxil  [Paroxetine Hcl]     syncope     Current Outpatient Prescriptions:  .  albuterol (VENTOLIN HFA) 108 (90 Base) MCG/ACT inhaler, Inhale 2 puffs into the lungs every 6 (six) hours as needed for wheezing. May substitute generic or equivalent brand name (Patient not taking: Reported on 09/08/2016), Disp: 18 g, Rfl: 1 .  alendronate (FOSAMAX) 70 MG tablet, Take 1 tablet (70 mg total) by mouth once a week., Disp: 12 tablet, Rfl: 4 .  atenolol (TENORMIN) 50 MG tablet, TAKE 1 TABLET BY MOUTH ONCE DAILY., Disp: 30 tablet, Rfl: 6 .  azelastine (ASTELIN) 0.1 % nasal spray, Place 1 spray into both nostrils daily., Disp: , Rfl:  .  chlorpheniramine-HYDROcodone (TUSSIONEX PENNKINETIC ER) 10-8 MG/5ML SUER, Take 5 mLs by mouth at bedtime as needed for cough., Disp: 120 mL, Rfl: 0 .  cholestyramine (QUESTRAN) 4 GM/DOSE powder, Take 1 packet (4 g total) by mouth 2 (two) times daily with a meal., Disp: 378 g, Rfl: 12 .  doxycycline (VIBRA-TABS) 100 MG tablet, Take 1 tablet (100 mg total) by mouth 2 (two) times daily., Disp: 20 tablet, Rfl: 0 .  Fexofenadine HCl (ALLEGRA PO), Take 1 capsule by mouth daily., Disp: , Rfl:  .  fluticasone (FLONASE) 50  MCG/ACT nasal spray, Place 2 sprays into both nostrils daily. , Disp: , Rfl:  .  ipratropium (ATROVENT) 0.03 % nasal spray, Place 1 spray into both nostrils daily., Disp: , Rfl:  .  LORazepam (ATIVAN) 0.5 MG tablet, Take 1 tablet (0.5 mg total) by mouth every 6 (six) hours as needed for anxiety. (Patient not taking: Reported on 09/08/2016), Disp: 30 tablet, Rfl: 3 .  montelukast (SINGULAIR) 10 MG tablet, Take 10 mg by mouth at bedtime., Disp: , Rfl:  .  pramipexole (MIRAPEX) 1 MG tablet, Take 1 tablet (1 mg total) by mouth 2 (two) times daily., Disp: 180 tablet, Rfl: 3 .  QVAR 40 MCG/ACT inhaler, Inhale 2 puffs into the lungs 2 (two) times daily., Disp: , Rfl:  .  RABEprazole (ACIPHEX) 20 MG tablet, Take by mouth. Take 1 tablet (20 mg total) by mouth 2 (two) times daily. TAKE 30 MIN BEFORE MEALS, Disp: , Rfl:  .  sucralfate (CARAFATE) 1 g tablet, Take by mouth. Take 1 tablet (1 g total) by mouth 4 (four) times daily before meals and nightly, Disp: , Rfl:  .  SUMAtriptan (IMITREX) 100 MG tablet, TAKE 1 AT ONSET OF HEADACHE.  MAY TAKE AN ADDITIONAL TABLET IN 2 HOURS IF NEEEDED BUT ONLY 2 TABLETS IN 24 HOURS, Disp: 9  tablet, Rfl: 5 .  Vitamin D, Ergocalciferol, (DRISDOL) 50000 UNITS CAPS capsule, Take 50,000 Units by mouth every 7 (seven) days. Reported on 01/14/2016, Disp: , Rfl:  .  zolpidem (AMBIEN) 10 MG tablet, Take 0.5-1 tablets (5-10 mg total) by mouth at bedtime as needed for sleep. (Patient not taking: Reported on 07/08/2016), Disp: 30 tablet, Rfl: 2  Review of Systems  Constitutional: Negative for appetite change, chills, fatigue and fever.  Respiratory: Negative for chest tightness and shortness of breath.   Cardiovascular: Negative for chest pain and palpitations.  Gastrointestinal: Negative for abdominal pain, nausea and vomiting.  Skin:       Swelling of the right deltoid  Neurological: Negative for dizziness and weakness.    Social History  Substance Use Topics  . Smoking status:  Former Smoker    Packs/day: 1.00    Years: 10.00    Types: Cigarettes    Quit date: 11/10/1979  . Smokeless tobacco: Never Used  . Alcohol use 0.0 oz/week     Comment: occasional   Objective:   BP 122/60 (BP Location: Left Arm, Patient Position: Sitting, Cuff Size: Normal)   Pulse (!) 59   Temp 98.2 F (36.8 C) (Oral)   Resp 16   Wt 145 lb (65.8 kg)   SpO2 96% Comment: room air  BMI 25.69 kg/m  There were no vitals filed for this visit.   Physical Exam  Tender over right distal deltoid over upper humerous. Abduction strength limited due to pain. Slight soft tissue swelling at injection site. No erythema and not hot to touch.     Assessment & Plan:     1. Local reaction to pneumococcal vaccine, sequela   2. Injection site reaction, initial encounter Reassurance. May apply heat 1-2 times a day as needed. Expect quick improvement over the next week but may have nodule in arm for several months. Call if not improving as anticipated.        [name], MD  [facility]

## 2016-12-15 ENCOUNTER — Ambulatory Visit: Payer: Medicare PPO | Admitting: Physician Assistant

## 2016-12-16 ENCOUNTER — Other Ambulatory Visit
Admission: RE | Admit: 2016-12-16 | Discharge: 2016-12-16 | Disposition: A | Discharge status: Home / Self Care | Payer: Medicare PPO | Source: Ambulatory Visit | Attending: Family Medicine | Admitting: Family Medicine

## 2016-12-16 DIAGNOSIS — R197 Diarrhea, unspecified: Secondary | ICD-10-CM | POA: Insufficient documentation

## 2016-12-16 LAB — GASTROINTESTINAL PANEL BY PCR, STOOL (REPLACES STOOL CULTURE)
ADENOVIRUS F40/41: NOT DETECTED
Astrovirus: NOT DETECTED
CRYPTOSPORIDIUM: NOT DETECTED
CYCLOSPORA CAYETANENSIS: NOT DETECTED
Campylobacter species: NOT DETECTED
ENTEROAGGREGATIVE E COLI (EAEC): NOT DETECTED
ENTEROPATHOGENIC E COLI (EPEC): NOT DETECTED
Entamoeba histolytica: NOT DETECTED
Enterotoxigenic E coli (ETEC): NOT DETECTED
GIARDIA LAMBLIA: NOT DETECTED
Norovirus GI/GII: NOT DETECTED
PLESIMONAS SHIGELLOIDES: NOT DETECTED
Rotavirus A: NOT DETECTED
Salmonella species: NOT DETECTED
Sapovirus (I, II, IV, and V): NOT DETECTED
Shiga like toxin producing E coli (STEC): NOT DETECTED
Shigella/Enteroinvasive E coli (EIEC): NOT DETECTED
VIBRIO SPECIES: NOT DETECTED
Vibrio cholerae: NOT DETECTED
YERSINIA ENTEROCOLITICA: NOT DETECTED

## 2016-12-16 LAB — C DIFFICILE QUICK SCREEN W PCR REFLEX
C DIFFICILE (CDIFF) INTERP: NOT DETECTED
C DIFFICILE (CDIFF) TOXIN: NEGATIVE
C Diff antigen: NEGATIVE

## 2017-01-04 ENCOUNTER — Other Ambulatory Visit: Payer: Self-pay | Admitting: Family Medicine

## 2017-01-04 ENCOUNTER — Ambulatory Visit: Payer: Medicare PPO | Admitting: Family Medicine

## 2017-01-05 ENCOUNTER — Encounter: Payer: Medicare PPO | Admitting: Family Medicine

## 2017-01-11 ENCOUNTER — Ambulatory Visit: Payer: Medicare PPO

## 2017-01-12 ENCOUNTER — Ambulatory Visit (INDEPENDENT_AMBULATORY_CARE_PROVIDER_SITE_OTHER): Payer: Medicare PPO

## 2017-01-12 ENCOUNTER — Ambulatory Visit: Payer: Medicare PPO

## 2017-01-12 VITALS — BP 158/82 | HR 64 | Temp 99.3°F | Ht 63.0 in | Wt 139.6 lb

## 2017-01-12 DIAGNOSIS — Z0000 Encounter for general adult medical examination without abnormal findings: Secondary | ICD-10-CM | POA: Diagnosis not present

## 2017-01-12 NOTE — Progress Notes (Signed)
Subjective:   Heidi Hernandez is a 66 y.o. female who presents for Medicare Annual (Subsequent) preventive examination.  Review of Systems:  N/A  Cardiac Risk Factors include: advanced age (>2men, >31 women);hypertension     Objective:     Vitals: BP (!) 158/82 (BP Location: Left Arm)   Pulse 64   Temp 99.3 F (37.4 C) (Oral)   Ht 5\' 3"  (1.6 m)   Wt 139 lb 9.6 oz (63.3 kg)   BMI 24.73 kg/m   Body mass index is 24.73 kg/m.   Tobacco History  Smoking Status  . Former Smoker  . Packs/day: 1.00  . Years: 10.00  . Types: Cigarettes  . Quit date: 11/10/1979  Smokeless Tobacco  . Never Used     Counseling given: Not Answered   Past Medical History:  Diagnosis Date  . Anxiety   . Asthma   . GERD (gastroesophageal reflux disease)   . Headache    Migraines  . Hypertension   . Idiopathic thrombocytopenia purpura (HCC)    Required splenectomy  . MVP (mitral valve prolapse)   . Neuromuscular disorder (HCC)    Restless Leg Syndrome  . Pancreatitis April 2017 [facility] due to cholecystitis.   . Pneumonia    06-2015   Past Surgical History:  Procedure Laterality Date  . ABDOMINAL HYSTERECTOMY    . APPENDECTOMY    . CARPAL TUNNEL RELEASE Bilateral 11/19/2014   Procedure: CARPAL TUNNEL RELEASE;  Surgeon: Donato Heinz, MD;  Location: [facility] ORS;  Service: Orthopedics;  Laterality: Bilateral;  . CESAREAN SECTION     times 2  . CHOLECYSTECTOMY  09/29/2015   [facility]  . COLONOSCOPY N/A 05/02/2016   Procedure: COLONOSCOPY;  Surgeon: Scot Jun, MD;  Location: [facility] END[facility]COPY;  Service: Endoscopy;  Laterality: N/A;  . ESOPHAGOGASTRODUODENOSCOPY (EGD) WITH PROPOFOL N/A 05/02/2016   Procedure: ESOPHAGOGASTRODUODENOSCOPY (EGD) WITH PROPOFOL;  Surgeon: Scot Jun, MD;  Location: [facility] ENDOSCOPY;  Service: Endoscopy;  Laterality: N/A;  . HERNIA REPAIR     times 3  . OVARIAN CYST SURGERY    . SHOULDER ARTHROSCOPY WITH ROTATOR CUFF REPAIR     times 2  . SPLENECTOMY,  TOTAL    . TRIGGER FINGER RELEASE Right 11/19/2014   Procedure: RELEASE TRIGGER FINGER/A-1 PULLEY;  Surgeon: Donato Heinz, MD;  Location: [facility] ORS;  Service: Orthopedics;  Laterality: Right;  . TRIGGER FINGER RELEASE Right 08/12/2015   Procedure: RELEASE TRIGGER LONG FINGER;  Surgeon: Donato Heinz, MD;  Location: [facility] ORS;  Service: Orthopedics;  Laterality: Right;   Family History  Problem Relation Age of Onset  . Hypertension Mother   . Renal Disease Father        ESRD with HD   History  Sexual Activity  . Sexual activity: No    Outpatient Encounter Prescriptions as of 01/12/2017  Medication Sig  . albuterol (VENTOLIN HFA) 108 (90 Base) MCG/ACT inhaler Inhale 2 puffs into the lungs every 6 (six) hours as needed for wheezing. May substitute generic or equivalent brand name  . alendronate (FOSAMAX) 70 MG tablet Take 1 tablet (70 mg total) by mouth once a week.  Marland Kitchen atenolol (TENORMIN) 50 MG tablet TAKE 1 TABLET BY MOUTH ONCE DAILY  . azelastine (ASTELIN) 0.1 % nasal spray Place 1 spray into both nostrils daily.  . cholestyramine (QUESTRAN) 4 GM/DOSE powder Take 1 packet (4 g total) by mouth 2 (two) times daily with a meal. (Patient taking differently: Take 4 g  by mouth 2 (two) times daily with a meal. )  . Fexofenadine HCl (ALLEGRA PO) Take 1 capsule by mouth daily.  . fluticasone (FLONASE) 50 MCG/ACT nasal spray Place 2 sprays into both nostrils daily.   Marland Kitchen ipratropium (ATROVENT) 0.03 % nasal spray Place 1 spray into both nostrils daily.  . montelukast (SINGULAIR) 10 MG tablet Take 10 mg by mouth at bedtime.  . pramipexole (MIRAPEX) 1 MG tablet Take 1 tablet (1 mg total) by mouth 2 (two) times daily.  . RABEprazole (ACIPHEX) 20 MG tablet Take by mouth. Take 1 tablet (20 mg total) by mouth 2 (two) times daily. TAKE 30 MIN BEFORE MEALS  . sucralfate (CARAFATE) 1 g tablet Take by mouth. Take 1 tablet (1 g total) by mouth 4 (four) times daily before meals and nightly  . SUMAtriptan  (IMITREX) 100 MG tablet TAKE 1 AT ONSET OF HEADACHE.  MAY TAKE AN ADDITIONAL TABLET IN 2 HOURS IF NEEEDED BUT ONLY 2 TABLETS IN 24 HOURS  . [DISCONTINUED] chlorpheniramine-HYDROcodone (TUSSIONEX PENNKINETIC ER) 10-8 MG/5ML SUER Take 5 mLs by mouth at bedtime as needed for cough.  . [DISCONTINUED] doxycycline (VIBRA-TABS) 100 MG tablet Take 1 tablet (100 mg total) by mouth 2 (two) times daily.  . [DISCONTINUED] LORazepam (ATIVAN) 0.5 MG tablet Take 1 tablet (0.5 mg total) by mouth every 6 (six) hours as needed for anxiety. (Patient not taking: Reported on 09/08/2016)  . [DISCONTINUED] QVAR 40 MCG/ACT inhaler Inhale 2 puffs into the lungs 2 (two) times daily.  . [DISCONTINUED] Vitamin D, Ergocalciferol, (DRISDOL) 50000 UNITS CAPS capsule Take 50,000 Units by mouth every 7 (seven) days. Reported on 01/14/2016  . [DISCONTINUED] zolpidem (AMBIEN) 10 MG tablet Take 0.5-1 tablets (5-10 mg total) by mouth at bedtime as needed for sleep. (Patient not taking: Reported on 07/08/2016)   No facility-administered encounter medications on file as of 01/12/2017.     Activities of Daily Living In your present state of health, do you have any difficulty performing the following activities: 01/12/2017 01/14/2016  Hearing? N N  Vision? N N  Difficulty concentrating or making decisions? N N  Walking or climbing stairs? N N  Dressing or bathing? N N  Doing errands, shopping? N N  Preparing Food and eating ? N -  Using the Toilet? N -  In the past six months, have you accidently leaked urine? N -  Do you have problems with loss of bowel control? N -  Managing your Medications? N -  Managing your Finances? N -  Housekeeping or managing your Housekeeping? N -  Some recent data might be hidden    Patient Care Team: Malva Limes, MD as PCP - General (Family Medicine) Scot Jun, MD as Consulting Physician (Gastroenterology)    Assessment:     Exercise Activities and Dietary recommendations Current  Exercise Habits: Home exercise routine, Type of exercise: walking, Time (Minutes): 15 (to 20 minutes), Frequency (Times/Week): 3, Weekly Exercise (Minutes/Week): 45, Intensity: Mild, Exercise limited by: None identified  Goals    . Increase water intake          Recommend increasing water intake to 4 glasses a day.       Fall Risk Fall Risk  01/12/2017 01/14/2016 06/08/2015  Falls in the past year? No No No   Depression Screen PHQ 2/9 Scores 01/12/2017 01/14/2016 06/08/2015  PHQ - 2 Score 0 0 0     Cognitive Function     6CIT Screen 01/12/2017 01/12/2017  What Year?  0 points 0 points  What month? 0 points 0 points  What time? 0 points 0 points  Count back from 20 0 points 0 points  Months in reverse 0 points -  Repeat phrase 2 points -  Total Score 2 -    Immunization History  Administered Date(s) Administered  . HiB (PRP-T) 10/19/2016  . Meningococcal B, OMV 10/19/2016  . Meningococcal Mcv4o 10/19/2016  . Pneumococcal Conjugate-13 10/19/2016  . Pneumococcal Polysaccharide-23 04/14/2009  . Tdap 05/17/2011   Screening Tests Health Maintenance  Topic Date Due  . Hepatitis C Screening  Apr 25, 1951  . INFLUENZA VACCINE  01/25/2017  . MAMMOGRAM  09/24/2017  . PNA vac Low Risk Adult (2 of 2 - PPSV23) 10/19/2017  . TETANUS/TDAP  05/16/2021  . COLONOSCOPY  05/02/2026  . DEXA SCAN  Completed      Plan:  I have personally reviewed and addressed the Medicare Annual Wellness questionnaire and have noted the following in the patient's chart:  A. Medical and social history B. Use of alcohol, tobacco or illicit drugs  C. Current medications and supplements D. Functional ability and status E.  Nutritional status F.  Physical activity G. Advance directives H. List of other physicians I.  Hospitalizations, surgeries, and ER visits in previous 12 months J.  Vitals K. Screenings such as hearing and vision if needed, cognitive and depression L. Referrals and appointments -  none  In addition, I have reviewed and discussed with patient certain preventive protocols, quality metrics, and best practice recommendations. A written personalized care plan for preventive services as well as general preventive health recommendations were provided to patient.  See attached scanned questionnaire for additional information.   Signed,  Hyacinth MeekerMckenzie Chamar Broughton, LPN Nurse Health Advisor   MD Recommendations: None. Pt declined Hepatitis C screening today. Pt is going to check with insurance for cost and will advise us tomorrow if she is going to receive it.

## 2017-01-12 NOTE — Patient Instructions (Signed)
[name] , Thank you for taking time to come for your Medicare Wellness Visit. I appreciate your ongoing commitment to your health goals. Please review the following plan we discussed and let me know if I can assist you in the future.   Screening recommendations/referrals: Colonoscopy: completed 05/02/16, due 04/2026 Mammogram: completed 09/25/15, due 08/2016 Bone Density: completed 02/11/16 Recommended yearly ophthalmology/optometry visit for glaucoma screening and checkup Recommended yearly dental visit for hygiene and checkup  Vaccinations: Influenza vaccine: due 02/2017 Pneumococcal vaccine: completed series Tdap vaccine: completed 05/17/11, due 04/2021 Shingles vaccine: declined  Advanced directives: *Advance directive discussed with you today. Even though you declined this today please call our office should you change your mind and we can give you the proper paperwork for you to fill out.  Conditions/risks identified: Recommend increasing water intake to 4 glasses a day.   Next appointment: 01/13/17 @ 10:00 AM   Preventive Care 68 Years and Older, Female Preventive care refers to lifestyle choices and visits with your health care provider that can promote health and wellness. What does preventive care include?  A yearly physical exam. This is also called an annual well check.  Dental exams once or twice a year.  Routine eye exams. Ask your health care provider how often you should have your eyes checked.  Personal lifestyle choices, including:  Daily care of your teeth and gums.  Regular physical activity.  Eating a healthy diet.  Avoiding tobacco and drug use.  Limiting alcohol use.  Practicing safe sex.  Taking low-dose aspirin every day.  Taking vitamin and mineral supplements as recommended by your health care provider. What happens during an annual well check? The services and screenings done by your health care provider during your annual well check  will depend on your age, overall health, lifestyle risk factors, and family history of disease. Counseling  Your health care provider may ask you questions about your:  Alcohol use.  Tobacco use.  Drug use.  Emotional well-being.  Home and relationship well-being.  Sexual activity.  Eating habits.  History of falls.  Memory and ability to understand (cognition).  Work and work Astronomerenvironment.  Reproductive health. Screening  You may have the following tests or measurements:  Height, weight, and BMI.  Blood pressure.  Lipid and cholesterol levels. These may be checked every 5 years, or more frequently if you are over 68 years old.  Skin check.  Lung cancer screening. You may have this screening every year starting at age 68 if you have a 30-pack-year history of smoking and currently smoke or have quit within the past 15 years.  Fecal occult blood test (FOBT) of the stool. You may have this test every year starting at age 68.  Flexible sigmoidoscopy or colonoscopy. You may have a sigmoidoscopy every 5 years or a colonoscopy every 10 years starting at age 68.  Hepatitis C blood test.  Hepatitis B blood test.  Sexually transmitted disease (STD) testing.  Diabetes screening. This is done by checking your blood sugar (glucose) after you have not eaten for a while (fasting). You may have this done every 1-3 years.  Bone density scan. This is done to screen for osteoporosis. You may have this done starting at age 68.  Mammogram. This may be done every 1-2 years. Talk to your health care provider about how often you should have regular mammograms. Talk with your health care provider about your test results, treatment options, and if necessary, the need for more tests.  Vaccines  Your health care provider may recommend certain vaccines, such as:  Influenza vaccine. This is recommended every year.  Tetanus, diphtheria, and acellular pertussis (Tdap, Td) vaccine. You may  need a Td booster every 10 years.  Zoster vaccine. You may need this after age 54.  Pneumococcal 13-valent conjugate (PCV13) vaccine. One dose is recommended after age 68.  Pneumococcal polysaccharide (PPSV23) vaccine. One dose is recommended after age 68. Talk to your health care provider about which screenings and vaccines you need and how often you need them. This information is not intended to replace advice given to you by your health care provider. Make sure you discuss any questions you have with your health care provider. Document Released: 07/10/2015 Document Revised: 03/02/2016 Document Reviewed: 04/14/2015 Elsevier Interactive Patient Education  2017 [place] Prevention in the Home Falls can cause injuries. They can happen to people of all ages. There are many things you can do to make your home safe and to help prevent falls. What can I do on the outside of my home?  Regularly fix the edges of walkways and driveways and fix any cracks.  Remove anything that might make you trip as you walk through a door, such as a raised step or threshold.  Trim any bushes or trees on the path to your home.  Use bright outdoor lighting.  Clear any walking paths of anything that might make someone trip, such as rocks or tools.  Regularly check to see if handrails are loose or broken. Make sure that both sides of any steps have handrails.  Any raised decks and porches should have guardrails on the edges.  Have any leaves, snow, or ice cleared regularly.  Use sand or salt on walking paths during winter.  Clean up any spills in your garage right away. This includes oil or grease spills. What can I do in the bathroom?  Use night lights.  Install grab bars by the toilet and in the tub and shower. Do not use towel bars as grab bars.  Use non-skid mats or decals in the tub or shower.  If you need to sit down in the shower, use a plastic, non-slip stool.  Keep the floor  dry. Clean up any water that spills on the floor as soon as it happens.  Remove soap buildup in the tub or shower regularly.  Attach bath mats securely with double-sided non-slip rug tape.  Do not have throw rugs and other things on the floor that can make you trip. What can I do in the bedroom?  Use night lights.  Make sure that you have a light by your bed that is easy to reach.  Do not use any sheets or blankets that are too big for your bed. They should not hang down onto the floor.  Have a firm chair that has side arms. You can use this for support while you get dressed.  Do not have throw rugs and other things on the floor that can make you trip. What can I do in the kitchen?  Clean up any spills right away.  Avoid walking on wet floors.  Keep items that you use a lot in easy-to-reach places.  If you need to reach something above you, use a strong step stool that has a grab bar.  Keep electrical cords out of the way.  Do not use floor polish or wax that makes floors slippery. If you must use wax, use non-skid floor wax.  Do not have throw rugs and other things on the floor that can make you trip. What can I do with my stairs?  Do not leave any items on the stairs.  Make sure that there are handrails on both sides of the stairs and use them. Fix handrails that are broken or loose. Make sure that handrails are as long as the stairways.  Check any carpeting to make sure that it is firmly attached to the stairs. Fix any carpet that is loose or worn.  Avoid having throw rugs at the top or bottom of the stairs. If you do have throw rugs, attach them to the floor with carpet tape.  Make sure that you have a light switch at the top of the stairs and the bottom of the stairs. If you do not have them, ask someone to add them for you. What else can I do to help prevent falls?  Wear shoes that:  Do not have high heels.  Have rubber bottoms.  Are comfortable and fit you  well.  Are closed at the toe. Do not wear sandals.  If you use a stepladder:  Make sure that it is fully opened. Do not climb a closed stepladder.  Make sure that both sides of the stepladder are locked into place.  Ask someone to hold it for you, if possible.  Clearly mark and make sure that you can see:  Any grab bars or handrails.  First and last steps.  Where the edge of each step is.  Use tools that help you move around (mobility aids) if they are needed. These include:  Canes.  Walkers.  Scooters.  Crutches.  Turn on the lights when you go into a dark area. Replace any light bulbs as soon as they burn out.  Set up your furniture so you have a clear path. Avoid moving your furniture around.  If any of your floors are uneven, fix them.  If there are any pets around you, be aware of where they are.  Review your medicines with your doctor. Some medicines can make you feel dizzy. This can increase your chance of falling. Ask your doctor what other things that you can do to help prevent falls. This information is not intended to replace advice given to you by your health care provider. Make sure you discuss any questions you have with your health care provider. Document Released: 04/09/2009 Document Revised: 11/19/2015 Document Reviewed: 07/18/2014 Elsevier Interactive Patient Education  2017 [employer].

## 2017-01-13 ENCOUNTER — Ambulatory Visit (INDEPENDENT_AMBULATORY_CARE_PROVIDER_SITE_OTHER): Payer: Medicare PPO | Admitting: Family Medicine

## 2017-01-13 ENCOUNTER — Encounter: Payer: Self-pay | Admitting: Family Medicine

## 2017-01-13 ENCOUNTER — Other Ambulatory Visit: Payer: Self-pay | Admitting: Family Medicine

## 2017-01-13 VITALS — BP 138/80 | HR 67 | Temp 98.7°F | Resp 16 | Ht 64.75 in | Wt 141.0 lb

## 2017-01-13 DIAGNOSIS — S46211A Strain of muscle, fascia and tendon of other parts of biceps, right arm, initial encounter: Secondary | ICD-10-CM | POA: Diagnosis not present

## 2017-01-13 DIAGNOSIS — M810 Age-related osteoporosis without current pathological fracture: Secondary | ICD-10-CM | POA: Diagnosis not present

## 2017-01-13 DIAGNOSIS — Z0000 Encounter for general adult medical examination without abnormal findings: Secondary | ICD-10-CM | POA: Diagnosis not present

## 2017-01-13 DIAGNOSIS — Q8901 Asplenia (congenital): Secondary | ICD-10-CM

## 2017-01-13 DIAGNOSIS — E559 Vitamin D deficiency, unspecified: Secondary | ICD-10-CM | POA: Diagnosis not present

## 2017-01-13 DIAGNOSIS — I10 Essential (primary) hypertension: Secondary | ICD-10-CM | POA: Diagnosis not present

## 2017-01-13 NOTE — Progress Notes (Signed)
Patient: Heidi Hernandez, Female    DOB: Feb 05, 1951, 66 y.o.   MRN: [id number] Visit Date: 01/13/2017  Today's Provider: [name], MD   Chief Complaint  Patient presents with  . Annual Exam  . Hypertension  . Anxiety  . Osteoporosis   Subjective:    Annual physical exam Heidi Hernandez is a 66 y.o. female who presents today for health maintenance and complete physical. She feels well. She reports exercising yes/walking & riding a bike. She reports she is sleeping well.  ----------------------------------------------------------------   Hypertension, follow-up:  BP Readings from Last 3 Encounters:  01/13/17 (!) 160/80  01/12/17 (!) 158/82  10/26/16 122/60    She was last seen for hypertension 1 years ago.  BP at that visit was 144/80; labs checked, no changes. Management since that visit includes; no changes.She reports good compliance with treatment. She is not having side effects. none She is exercising. She is adherent to low salt diet.   Outside blood pressures are 120/70. She is experiencing none.  Patient denies none.   Cardiovascular risk factors include none.  Use of agents associated with hypertension: none.   ----------------------------------------------------------------   She also reports injury to her right biceps a few years ago and was seen by Dr. Ernest Pine. Appears to have been a partially ruptured biceps with muscle ball formation about 2/3 way between shoulder and elbow. She states have the last few months she has not been able to raise right arm > about 80 degrees and is concerned about loss of function of arm.     Review of Systems  Constitutional: Negative for chills, fatigue and fever.  HENT: Negative for congestion, ear pain, rhinorrhea, sneezing and sore throat.   Eyes: Negative.  Negative for pain and redness.  Respiratory: Negative for cough, shortness of breath and wheezing.   Cardiovascular: Negative for chest pain and leg  swelling.  Gastrointestinal: Negative for abdominal pain, blood in stool, constipation, diarrhea and nausea.  Endocrine: Negative for polydipsia and polyphagia.  Genitourinary: Negative.  Negative for dysuria, flank pain, hematuria, pelvic pain, vaginal bleeding and vaginal discharge.  Musculoskeletal: Negative for arthralgias, back pain, gait problem and joint swelling.  Skin: Negative for rash.  Neurological: Negative.  Negative for dizziness, tremors, seizures, weakness, light-headedness, numbness and headaches.  Hematological: Negative for adenopathy.  Psychiatric/Behavioral: Negative.  Negative for behavioral problems, confusion and dysphoric mood. The patient is not nervous/anxious and is not hyperactive.     Social History      She  reports that she quit smoking about 37 years ago. Her smoking use included Cigarettes. She has a 10.00 pack-year smoking history. She has never used smokeless tobacco. She reports that she drinks alcohol. She reports that she does not use drugs.       Social History   Social History  . Marital status: Married    Spouse name: N/A  . Number of children: 2  . Years of education: N/A   Occupational History  . Retired    Social History Main Topics  . Smoking status: Former Smoker    Packs/day: 1.00    Years: 10.00    Types: Cigarettes    Quit date: 11/10/1979  . Smokeless tobacco: Never Used  . Alcohol use 0.0 oz/week     Comment: occasional- wine  . Drug use: No  . Sexual activity: No   Other Topics Concern  . None   Social History Narrative  . None  Past Medical History:  Diagnosis Date  . Anxiety   . Asthma   . GERD (gastroesophageal reflux disease)   . Headache    Migraines  . Hypertension   . Idiopathic thrombocytopenia purpura (HCC)    Required splenectomy  . MVP (mitral valve prolapse)   . Neuromuscular disorder (HCC)    Restless Leg Syndrome  . [facility]   [place]kely due to cholecystitis.   . Pneumonia     06-2015     Patient Active Problem List   Diagnosis Date Noted  . Gallstone pancreatitis 10/15/2015  . Chondrocalcinosis of left ankle 12/24/2014  . Allergic rhinitis 12/23/2014  . Anxiety 12/23/2014  . Asplenia 12/23/2014  . History of ITP 12/23/2014  . OP (osteoporosis) 12/23/2014  . Restless legs syndrome 12/23/2014  . Neuralgia neuritis, sciatic nerve 12/23/2014  . Actinic keratosis 12/23/2014  . Mitral valve prolapse 12/23/2014  . LVH (left ventricular hypertrophy) 12/23/2014  . Carpal tunnel syndrome 11/19/2014  . Trigger finger, acquired 11/19/2014  . Vitamin D deficiency 09/28/2012  . Dysthymia 04/14/2009  . Hypertension 04/14/2009  . Headache, migraine 04/14/2009  . Artificial menopause 04/14/2009  . Insomnia 04/14/2009  . Cardiac murmur 04/14/2009    Past Surgical History:  Procedure Laterality Date  . ABDOMINAL HYSTERECTOMY    . APPENDECTOMY    . CARPAL TUNNEL RELEASE Bilateral 11/19/2014   Procedure: CARPAL TUNNEL RELEASE;  Surgeon: Donato HeinzJames P Hooten, MD;  Location: [facility] ORS;  Service: Orthopedics;  Laterality: Bilateral;  . CESAREAN SECTION     times 2  . CHOLECYSTECTOMY  09/29/2015   [facility]  . COLONOSCOPY N/A 05/02/2016   Procedure: COLONOSCOPY;  Surgeon: Scot Junobert T Elliott, MD;  [facility];  Service: Endoscopy;  Laterality: N/A;  . ESOPHAGOGASTRODUODENOSCOPY (EGD) WITH PROPOFOL N/A 05/02/2016   Procedure: ESOPHAGOGASTRODUODENOSCOPY (EGD) WITH PROPOFOL;  Surgeon: Scot Junobert T Elliott, MD;  [facility];  Service: Endoscopy;  Laterality: N/A;  . HERNIA REPAIR     times 3  . OVARIAN CYST SURGERY    . SHOULDER ARTHROSCOPY WITH ROTATOR CUFF REPAIR     times 2  . SPLENECTOMY, TOTAL    . TRIGGER FINGER RELEASE Right 11/19/2014   Procedure: RELEASE TRIGGER FINGER/A-1 PULLEY;  Surgeon: Donato HeinzJames P Hooten, MD;  Location: [facility] ORS;  Service: Orthopedics;  Laterality: Right;  . TRIGGER FINGER RELEASE Right 08/12/2015   Procedure: RELEASE TRIGGER LONG FINGER;   Surgeon: Donato HeinzJames P Hooten, MD;  Location: [facility] ORS;  Service: Orthopedics;  Laterality: Right;    Family History        Family Status  Relation Status  . Mother Deceased at age 68       aortic aneurysm  . Father Deceased at age 68       Prostate Cancer  . Brother Alive       ITP  . Brother Alive       ITP        Her family history includes Hypertension in her mother; Renal Disease in her father.     Allergies  Allergen Reactions  . Catfish [Fish Allergy]   . Droperidol Other (See Comments)    "Locked jaw" Facial muscles locked  . Morphine Itching and Nausea And Vomiting  . Paxil  [Paroxetine Hcl]     syncope     Current Outpatient Prescriptions:  .  albuterol (VENTOLIN HFA) 108 (90 Base) MCG/ACT inhaler, Inhale 2 puffs into the lungs every 6 (six) hours as needed for wheezing. May substitute generic  or equivalent brand name, Disp: 18 g, Rfl: 1 .  alendronate (FOSAMAX) 70 MG tablet, Take 1 tablet (70 mg total) by mouth once a week., Disp: 12 tablet, Rfl: 4 .  atenolol (TENORMIN) 50 MG tablet, TAKE 1 TABLET BY MOUTH ONCE DAILY, Disp: 30 tablet, Rfl: 5 .  azelastine (ASTELIN) 0.1 % nasal spray, Place 1 spray into both nostrils daily., Disp: , Rfl:  .  cholestyramine (QUESTRAN) 4 GM/DOSE powder, Take 1 packet (4 g total) by mouth 2 (two) times daily with a meal. (Patient taking differently: Take 4 g by mouth 2 (two) times daily with a meal. ), Disp: 378 g, Rfl: 12 .  Fexofenadine HCl (ALLEGRA PO), Take 1 capsule by mouth daily., Disp: , Rfl:  .  fluticasone (FLONASE) 50 MCG/ACT nasal spray, Place 2 sprays into both nostrils daily. , Disp: , Rfl:  .  ipratropium (ATROVENT) 0.03 % nasal spray, Place 1 spray into both nostrils daily., Disp: , Rfl:  .  montelukast (SINGULAIR) 10 MG tablet, Take 10 mg by mouth at bedtime., Disp: , Rfl:  .  pramipexole (MIRAPEX) 1 MG tablet, Take 1 tablet (1 mg total) by mouth 2 (two) times daily., Disp: 180 tablet, Rfl: 3 .  RABEprazole (ACIPHEX) 20 MG  tablet, Take by mouth. Take 1 tablet (20 mg total) by mouth 2 (two) times daily. TAKE 30 MIN BEFORE MEALS, Disp: , Rfl:  .  sucralfate (CARAFATE) 1 g tablet, Take by mouth. Take 1 tablet (1 g total) by mouth 4 (four) times daily before meals and nightly, Disp: , Rfl:  .  SUMAtriptan (IMITREX) 100 MG tablet, TAKE 1 AT ONSET OF HEADACHE.  MAY TAKE AN ADDITIONAL TABLET IN 2 HOURS IF NEEEDED BUT ONLY 2 TABLETS IN 24 HOURS, Disp: 9 tablet, Rfl: 5   Patient Care Team: Malva Limes, MD as PCP - General (Family Medicine) Scot Jun, MD as Consulting Physician (Gastroenterology)      Objective:   Vitals: BP (!) 160/80 (BP Location: Right Arm, Patient Position: Sitting, Cuff Size: Normal)   Pulse 67   Temp 98.7 F (37.1 C) (Oral)   Resp 16   Ht 5' 3.75" (1.619 m)   Wt 141 lb (64 kg)   SpO2 96%   BMI 24.39 kg/m    Vitals:   01/13/17 1018  BP: (!) 160/80  Pulse: 67  Resp: 16  Temp: 98.7 F (37.1 C)  TempSrc: Oral  SpO2: 96%  Weight: 141 lb (64 kg)  Height: 5' 3.75" (1.619 m)     Physical Exam   General Appearance:    Alert, cooperative, no distress, appears stated age  Head:    Normocephalic, without obvious abnormality, atraumatic  Eyes:    PERRL, conjunctiva/corneas clear, EOM's intact, fundi    benign, both eyes  Ears:    Normal TM's and external ear canals, both ears  Nose:   Nares normal, septum midline, mucosa normal, no drainage    or sinus tenderness  Throat:   Lips, mucosa, and tongue normal; teeth and gums normal  Neck:   Supple, symmetrical, trachea midline, no adenopathy;    thyroid:  no enlargement/tenderness/nodules; no carotid   bruit or JVD  Back:     Symmetric, no curvature, ROM normal, no CVA tenderness  Lungs:     Clear to auscultation bilaterally, respirations unlabored  Chest Wall:    No tenderness or deformity   Heart:    Regular rate and rhythm, S1 and S2 normal, no  murmur, rub   or gallop  Breast Exam:    normal appearance, no masses or  tenderness  Abdomen:     Soft, non-tender, bowel sounds active all four quadrants,    no masses, no organomegaly  Pelvic:    deferred  Extremities:   Extremities normal, atraumatic, no cyanosis or edema  Pulses:   2+ and symmetric all extremities  Skin:   Skin color, texture, turgor normal, no rashes or lesions  Lymph nodes:   Cervical, supraclavicular, and axillary nodes normal  Neurologic:   CNII-XII intact, normal strength, sensation and reflexes    throughout    Depression Screen PHQ 2/9 Scores 01/12/2017 01/14/2016 06/08/2015  PHQ - 2 Score 0 0 0      Assessment & Plan:     Routine Health Maintenance and Physical Exam  Exercise Activities and Dietary recommendations Goals    . Increase water intake          Recommend increasing water intake to 4 glasses a day.        Immunization History  Administered Date(s) Administered  . HiB (PRP-T) 10/19/2016  . Meningococcal B, OMV 10/19/2016  . Meningococcal Mcv4o 10/19/2016  . Pneumococcal Conjugate-13 10/19/2016  . Pneumococcal Polysaccharide-23 04/14/2009  . Tdap 05/17/2011    Health Maintenance  Topic Date Due  . Hepatitis C Screening  [birth year]  . INFLUENZA VACCINE  01/25/2017  . MAMMOGRAM  09/24/2017  . PNA vac Low Risk Adult (2 of 2 - PPSV23) 10/19/2017  . TETANUS/TDAP  05/16/2021  . COLONOSCOPY  05/02/2026  . DEXA SCAN  Completed     Discussed health benefits of physical activity, and encouraged her to engage in regular exercise appropriate for her age and condition.    --------------------------------------------------------------------  1. Annual physical exam Generally doing well. She is to contact [facility] for mammogram. .rece  2. Essential hypertension Stable Continue current medications.   - Comprehensive metabolic panel  3. Osteoporosis without current pathological fracture, unspecified osteoporosis type Doing well on alendronate.   4. Vitamin D deficiency  - VITAMIN D 25 Hydroxy (Vit-D  Deficiency, Fractures)  5. Asplenia Due for Bexsero #2, she is waiting to get insurance straightened out.   6. Rupture of right distal biceps tendon, initial encounter Apparently old injury but has been having worsening mobility issues. Counseled about PT and orthopedic evaluation. Will get back in with Dr. Ernest Pine as she states he originally evaluated her a few years ago.    [name], MD  [facility]

## 2017-01-13 NOTE — Patient Instructions (Signed)
   Please contact your eyecare professional to schedule a routine eye exam  . Please call the [facility] ([phone]) to schedule a routine screening mammogram.   

## 2017-01-14 LAB — COMPREHENSIVE METABOLIC PANEL
A/G RATIO: 2 (ref 1.2–2.2)
ALBUMIN: 4.5 g/dL (ref 3.6–4.8)
ALT: 19 IU/L (ref 0–32)
AST: 23 IU/L (ref 0–40)
Alkaline Phosphatase: 67 IU/L (ref 39–117)
BILIRUBIN TOTAL: 0.5 mg/dL (ref 0.0–1.2)
BUN / CREAT RATIO: 25 (ref 12–28)
BUN: 17 mg/dL (ref 8–27)
CHLORIDE: 106 mmol/L (ref 96–106)
CO2: 22 mmol/L (ref 20–29)
Calcium: 9.4 mg/dL (ref 8.7–10.3)
Creatinine, Ser: 0.67 mg/dL (ref 0.57–1.00)
GFR calc non Af Amer: 91 mL/min/{1.73_m2} (ref >59)
GFR, EST AFRICAN AMERICAN: 104 mL/min/{1.73_m2} (ref >59)
GLOBULIN, TOTAL: 2.2 g/dL (ref 1.5–4.5)
Glucose: 102 mg/dL — ABNORMAL HIGH (ref 65–99)
POTASSIUM: 4.2 mmol/L (ref 3.5–5.2)
SODIUM: 145 mmol/L — AB (ref 134–144)
Total Protein: 6.7 g/dL (ref 6.0–8.5)

## 2017-01-14 LAB — VITAMIN D 25 HYDROXY (VIT D DEFICIENCY, FRACTURES): Vit D, 25-Hydroxy: 29.9 ng/mL — ABNORMAL LOW (ref 30.0–100.0)

## 2017-02-23 ENCOUNTER — Other Ambulatory Visit: Payer: Self-pay | Admitting: Orthopedic Surgery

## 2017-02-23 DIAGNOSIS — G8929 Other chronic pain: Secondary | ICD-10-CM

## 2017-02-23 DIAGNOSIS — M25511 Pain in right shoulder: Principal | ICD-10-CM

## 2017-03-02 ENCOUNTER — Ambulatory Visit: Payer: Medicare PPO

## 2017-03-10 ENCOUNTER — Ambulatory Visit
Admission: RE | Admit: 2017-03-10 | Discharge: 2017-03-10 | Disposition: A | Discharge status: Home / Self Care | Payer: Medicare PPO | Source: Ambulatory Visit | Attending: Orthopedic Surgery | Admitting: Orthopedic Surgery

## 2017-03-10 DIAGNOSIS — M25511 Pain in right shoulder: Secondary | ICD-10-CM | POA: Diagnosis present

## 2017-03-10 DIAGNOSIS — M6250 Muscle wasting and atrophy, not elsewhere classified, unspecified site: Secondary | ICD-10-CM | POA: Diagnosis not present

## 2017-03-10 DIAGNOSIS — M75101 Unspecified rotator cuff tear or rupture of right shoulder, not specified as traumatic: Secondary | ICD-10-CM | POA: Diagnosis not present

## 2017-03-10 DIAGNOSIS — G8929 Other chronic pain: Secondary | ICD-10-CM | POA: Diagnosis present

## 2017-03-22 ENCOUNTER — Other Ambulatory Visit: Payer: Self-pay | Admitting: Orthopedic Surgery

## 2017-03-22 DIAGNOSIS — M25511 Pain in right shoulder: Secondary | ICD-10-CM

## 2017-03-23 ENCOUNTER — Ambulatory Visit: Payer: Medicare PPO

## 2017-03-24 ENCOUNTER — Ambulatory Visit: Payer: Medicare PPO

## 2017-03-24 ENCOUNTER — Ambulatory Visit
Admission: RE | Admit: 2017-03-24 | Discharge: 2017-03-24 | Disposition: A | Discharge status: Home / Self Care | Payer: Medicare PPO | Source: Ambulatory Visit | Attending: Orthopedic Surgery | Admitting: Orthopedic Surgery

## 2017-03-24 DIAGNOSIS — M25511 Pain in right shoulder: Secondary | ICD-10-CM | POA: Diagnosis not present

## 2017-03-24 DIAGNOSIS — M19011 Primary osteoarthritis, right shoulder: Secondary | ICD-10-CM | POA: Diagnosis not present

## 2017-03-24 DIAGNOSIS — M75101 Unspecified rotator cuff tear or rupture of right shoulder, not specified as traumatic: Secondary | ICD-10-CM | POA: Diagnosis not present

## 2017-04-07 ENCOUNTER — Encounter
Admission: RE | Admit: 2017-04-07 | Discharge: 2017-04-07 | Disposition: A | Discharge status: Home / Self Care | Payer: Medicare PPO | Source: Ambulatory Visit | Attending: Orthopedic Surgery | Admitting: Orthopedic Surgery

## 2017-04-07 DIAGNOSIS — R001 Bradycardia, unspecified: Secondary | ICD-10-CM | POA: Diagnosis not present

## 2017-04-07 DIAGNOSIS — M75121 Complete rotator cuff tear or rupture of right shoulder, not specified as traumatic: Secondary | ICD-10-CM | POA: Insufficient documentation

## 2017-04-07 DIAGNOSIS — Z01818 Encounter for other preprocedural examination: Secondary | ICD-10-CM | POA: Diagnosis not present

## 2017-04-07 LAB — BASIC METABOLIC PANEL
Anion gap: 8 (ref 5–15)
BUN: 17 mg/dL (ref 6–20)
CHLORIDE: 110 mmol/L (ref 101–111)
CO2: 25 mmol/L (ref 22–32)
CREATININE: 0.73 mg/dL (ref 0.44–1.00)
Calcium: 9.1 mg/dL (ref 8.9–10.3)
GFR calc Af Amer: >60 mL/min (ref >60)
GFR calc non Af Amer: >60 mL/min (ref >60)
GLUCOSE: 102 mg/dL — AB (ref 65–99)
Potassium: 3.7 mmol/L (ref 3.5–5.1)
Sodium: 143 mmol/L (ref 135–145)

## 2017-04-07 LAB — CBC
HEMATOCRIT: 42.1 % (ref 35.0–47.0)
Hemoglobin: 14.6 g/dL (ref 12.0–16.0)
MCH: 32.4 pg (ref 26.0–34.0)
MCHC: 34.6 g/dL (ref 32.0–36.0)
MCV: 93.6 fL (ref 80.0–100.0)
Platelets: 120 10*3/uL — ABNORMAL LOW (ref 150–440)
RBC: 4.5 MIL/uL (ref 3.80–5.20)
RDW: 13.8 % (ref 11.5–14.5)
WBC: 6.4 10*3/uL (ref 3.6–11.0)

## 2017-04-07 LAB — URINALYSIS, ROUTINE W REFLEX MICROSCOPIC
BACTERIA UA: NONE SEEN
BILIRUBIN URINE: NEGATIVE
Glucose, UA: NEGATIVE mg/dL
KETONES UR: NEGATIVE mg/dL
LEUKOCYTES UA: NEGATIVE
NITRITE: NEGATIVE
Protein, ur: NEGATIVE mg/dL
Specific Gravity, Urine: 1.018 (ref 1.005–1.030)
pH: 6 (ref 5.0–8.0)

## 2017-04-07 LAB — SURGICAL PCR SCREEN
MRSA, PCR: NEGATIVE
STAPHYLOCOCCUS AUREUS: NEGATIVE

## 2017-04-07 LAB — TYPE AND SCREEN
ABO/RH(D): O POS
ANTIBODY SCREEN: NEGATIVE

## 2017-04-07 LAB — PROTIME-INR
INR: 1.01
Prothrombin Time: 13.2 seconds (ref 11.4–15.2)

## 2017-04-07 NOTE — Patient Instructions (Signed)
Your procedure is scheduled on: Friday 04/21/17 Report to DAY SURGERY. 2ND FLOOR MEDICAL MALL ENTRANCE. To find out your arrival time please call [phone] between 1PM - 3PM on Thursday 04/20/17.  Remember: Instructions that are not followed completely may result in serious medical risk, up to and including death, or upon the discretion of your surgeon and anesthesiologist your surgery may need to be rescheduled.    __X__ 1. Do not eat anything after midnight the night before your    procedure.  No gum chewing or hard candies.  You may drink clear   liquids up to 2 hours before you are scheduled to arrive at the   hospital for your procedure. Do not drink clear liquids within 2   hours of scheduled arrival to the hospital as this may lead to your   procedure being delayed or rescheduled.       Clear liquids include:   Water or Apple juice without pulp   Clear carbohydrate beverage such as Clearfast or Gatorade   Black coffee or Clear Tea (no milk, no creamer, do not add anything   to the coffee or tea)    Diabetics should only drink water   __X__ 2. No Alcohol for 24 hours before or after surgery.   ____ 3. Bring all medications with you on the day of surgery if instructed.    __X__ 4. Notify your doctor if there is any change in your medical condition     (cold, fever, infections).             __X___5. No smoking within 24 hours of your surgery.     Do not wear jewelry, make-up, hairpins, clips or nail polish.  Do not wear lotions, powders, or perfumes.   Do not shave 48 hours prior to surgery. Men may shave face and neck.  Do not bring valuables to the hospital.    [facility] is not responsible for any belongings or valuables.               Contacts, dentures or bridgework may not be worn into surgery.  Leave your suitcase in the car. After surgery it may be brought to your room.  For patients admitted to the hospital, discharge time is determined by your                 treatment team.   Patients discharged the day of surgery will not be allowed to drive home.   Please read over the following fact sheets that you were given:   MRSA Information   __x__ Take these medicines the morning of surgery with A SIP OF WATER:    1. atenolol  2. allegra  3. pramipexole  4.rabeprazole  5.  6.  ____ Fleet Enema (as directed)   __x__ Use CHG Soap/SAGE wipes as directed  __x__ Use inhalers on the day of surgery also use rescue inhaler  ____ Stop metformin 2 days prior to surgery    ____ Take 1/2 of usual insulin dose the night before surgery and none on the morning of surgery.   ____ Stop Coumadin/Plavix/aspirin on   __X__ Stop Anti-inflammatories such as Advil, Aleve, Ibuprofen, Motrin, Naproxen, Naprosyn, Goodies,powder, or aspirin products.  OK to take Tylenol.   __X__ Stop supplements, Vitamin E, Fish Oil until after surgery.    ____ Bring C-Pap to the hospital.

## 2017-04-07 NOTE — Pre-Procedure Instructions (Signed)
Ekg reviewed by Dr Maisie Fus, no new orders

## 2017-04-13 ENCOUNTER — Other Ambulatory Visit: Payer: Self-pay | Admitting: Family Medicine

## 2017-04-13 DIAGNOSIS — G2581 Restless legs syndrome: Secondary | ICD-10-CM

## 2017-04-13 NOTE — Telephone Encounter (Signed)
Pharmacy requesting refills. Thanks!  

## 2017-04-20 MED ORDER — CEFAZOLIN SODIUM-DEXTROSE 2-4 GM/100ML-% IV SOLN
2.0000 g | Freq: Once | INTRAVENOUS | Status: AC
  Administered 2017-04-21: 2 g via INTRAVENOUS

## 2017-04-21 ENCOUNTER — Inpatient Hospital Stay: Payer: Medicare PPO | Admitting: Anesthesiology

## 2017-04-21 ENCOUNTER — Inpatient Hospital Stay
Admission: RE | Admit: 2017-04-21 | Discharge: 2017-04-22 | DRG: 483 | Disposition: A | Discharge status: Home / Self Care | Payer: Medicare PPO | Source: Ambulatory Visit | Attending: Orthopedic Surgery | Admitting: Orthopedic Surgery

## 2017-04-21 ENCOUNTER — Encounter
Admission: RE | Disposition: A | Discharge status: Home / Self Care | Payer: Self-pay | Source: Ambulatory Visit | Attending: Orthopedic Surgery

## 2017-04-21 ENCOUNTER — Inpatient Hospital Stay: Payer: Medicare PPO

## 2017-04-21 DIAGNOSIS — F419 Anxiety disorder, unspecified: Secondary | ICD-10-CM | POA: Diagnosis present

## 2017-04-21 DIAGNOSIS — M25511 Pain in right shoulder: Secondary | ICD-10-CM | POA: Diagnosis present

## 2017-04-21 DIAGNOSIS — Z96611 Presence of right artificial shoulder joint: Secondary | ICD-10-CM

## 2017-04-21 DIAGNOSIS — D693 Immune thrombocytopenic purpura: Secondary | ICD-10-CM | POA: Diagnosis present

## 2017-04-21 DIAGNOSIS — J45909 Unspecified asthma, uncomplicated: Secondary | ICD-10-CM | POA: Diagnosis present

## 2017-04-21 DIAGNOSIS — I959 Hypotension, unspecified: Secondary | ICD-10-CM | POA: Diagnosis present

## 2017-04-21 DIAGNOSIS — Z9081 Acquired absence of spleen: Secondary | ICD-10-CM

## 2017-04-21 DIAGNOSIS — K219 Gastro-esophageal reflux disease without esophagitis: Secondary | ICD-10-CM | POA: Diagnosis present

## 2017-04-21 DIAGNOSIS — I10 Essential (primary) hypertension: Secondary | ICD-10-CM | POA: Diagnosis present

## 2017-04-21 DIAGNOSIS — M12811 Other specific arthropathies, not elsewhere classified, right shoulder: Principal | ICD-10-CM | POA: Diagnosis present

## 2017-04-21 DIAGNOSIS — G2581 Restless legs syndrome: Secondary | ICD-10-CM | POA: Diagnosis present

## 2017-04-21 DIAGNOSIS — G8929 Other chronic pain: Secondary | ICD-10-CM | POA: Diagnosis present

## 2017-04-21 DIAGNOSIS — Z87891 Personal history of nicotine dependence: Secondary | ICD-10-CM

## 2017-04-21 DIAGNOSIS — M75121 Complete rotator cuff tear or rupture of right shoulder, not specified as traumatic: Secondary | ICD-10-CM | POA: Diagnosis present

## 2017-04-21 DIAGNOSIS — Z96619 Presence of unspecified artificial shoulder joint: Secondary | ICD-10-CM

## 2017-04-21 HISTORY — PX: TOTAL SHOULDER ARTHROPLASTY: SHX126

## 2017-04-21 LAB — ABO/RH: ABO/RH(D): O POS

## 2017-04-21 SURGERY — ARTHROPLASTY, SHOULDER, TOTAL
Anesthesia: General | Laterality: Right

## 2017-04-21 MED ORDER — DEXAMETHASONE SODIUM PHOSPHATE 10 MG/ML IJ SOLN
INTRAMUSCULAR | Status: DC | PRN
  Administered 2017-04-21: 8 mg via INTRAVENOUS

## 2017-04-21 MED ORDER — DEXTROSE 5 % IV SOLN
2.0000 g | Freq: Four times a day (QID) | INTRAVENOUS | Status: AC
  Administered 2017-04-21 – 2017-04-22 (×3): 2 g via INTRAVENOUS
  Filled 2017-04-21 (×4): qty 20

## 2017-04-21 MED ORDER — GLYCOPYRROLATE 0.2 MG/ML IJ SOLN
INTRAMUSCULAR | Status: AC
  Filled 2017-04-21: qty 1

## 2017-04-21 MED ORDER — EPHEDRINE SULFATE 50 MG/ML IJ SOLN
INTRAMUSCULAR | Status: DC | PRN
  Administered 2017-04-21 (×2): 10 mg via INTRAVENOUS
  Administered 2017-04-21: 15 mg via INTRAVENOUS
  Administered 2017-04-21: 10 mg via INTRAVENOUS
  Administered 2017-04-21: 5 mg via INTRAVENOUS

## 2017-04-21 MED ORDER — TRANEXAMIC ACID 1000 MG/10ML IV SOLN
1000.0000 mg | Freq: Once | INTRAVENOUS | Status: AC
  Administered 2017-04-21: 1000 mg via INTRAVENOUS
  Filled 2017-04-21: qty 10

## 2017-04-21 MED ORDER — LACTATED RINGERS IV SOLN
INTRAVENOUS | Status: DC
  Administered 2017-04-21: 11:00:00 via INTRAVENOUS
  Administered 2017-04-21: 1000 mL via INTRAVENOUS

## 2017-04-21 MED ORDER — BUPIVACAINE LIPOSOME 1.3 % IJ SUSP
INTRAMUSCULAR | Status: AC
  Filled 2017-04-21: qty 20

## 2017-04-21 MED ORDER — SUGAMMADEX SODIUM 200 MG/2ML IV SOLN
INTRAVENOUS | Status: AC
  Filled 2017-04-21: qty 2

## 2017-04-21 MED ORDER — PRAMIPEXOLE DIHYDROCHLORIDE 1 MG PO TABS
1.0000 mg | ORAL_TABLET | Freq: Two times a day (BID) | ORAL | Status: DC
  Administered 2017-04-21 – 2017-04-22 (×2): 1 mg via ORAL
  Filled 2017-04-21 (×2): qty 1

## 2017-04-21 MED ORDER — OXYCODONE HCL 5 MG/5ML PO SOLN: 5.0000 mg | Freq: Once | ORAL | Status: DC | PRN

## 2017-04-21 MED ORDER — ROPIVACAINE HCL 5 MG/ML IJ SOLN
INTRAMUSCULAR | Status: AC
  Filled 2017-04-21: qty 30

## 2017-04-21 MED ORDER — DEXAMETHASONE SODIUM PHOSPHATE 10 MG/ML IJ SOLN
INTRAMUSCULAR | Status: AC
  Filled 2017-04-21: qty 1

## 2017-04-21 MED ORDER — GLYCOPYRROLATE 0.2 MG/ML IJ SOLN
INTRAMUSCULAR | Status: DC | PRN
  Administered 2017-04-21 (×2): 0.2 mg via INTRAVENOUS

## 2017-04-21 MED ORDER — SUCRALFATE 1 G PO TABS
1.0000 g | ORAL_TABLET | Freq: Two times a day (BID) | ORAL | Status: DC
  Administered 2017-04-21 – 2017-04-22 (×2): 1 g via ORAL
  Filled 2017-04-21 (×2): qty 1

## 2017-04-21 MED ORDER — LIDOCAINE HCL (PF) 1 % IJ SOLN
INTRAMUSCULAR | Status: AC
Start: 2017-04-21 — End: 2017-04-21
  Filled 2017-04-21: qty 5

## 2017-04-21 MED ORDER — FLUTICASONE PROPIONATE 50 MCG/ACT NA SUSP
2.0000 | Freq: Every day | NASAL | Status: DC
  Filled 2017-04-21: qty 16

## 2017-04-21 MED ORDER — SODIUM CHLORIDE 0.9 % IV SOLN
INTRAVENOUS | Status: DC | PRN
  Administered 2017-04-21: 40 ug/min via INTRAVENOUS

## 2017-04-21 MED ORDER — FENTANYL CITRATE (PF) 100 MCG/2ML IJ SOLN
INTRAMUSCULAR | Status: DC | PRN
  Administered 2017-04-21 (×2): 50 ug via INTRAVENOUS

## 2017-04-21 MED ORDER — ONDANSETRON HCL 4 MG/2ML IJ SOLN
INTRAMUSCULAR | Status: DC | PRN
  Administered 2017-04-21: 4 mg via INTRAVENOUS

## 2017-04-21 MED ORDER — SUCCINYLCHOLINE CHLORIDE 20 MG/ML IJ SOLN
INTRAMUSCULAR | Status: AC
  Filled 2017-04-21: qty 1

## 2017-04-21 MED ORDER — ROCURONIUM BROMIDE 50 MG/5ML IV SOLN
INTRAVENOUS | Status: AC
  Filled 2017-04-21: qty 1

## 2017-04-21 MED ORDER — ACETAMINOPHEN 10 MG/ML IV SOLN
INTRAVENOUS | Status: DC | PRN
  Administered 2017-04-21: 1000 mg via INTRAVENOUS

## 2017-04-21 MED ORDER — FENTANYL CITRATE (PF) 100 MCG/2ML IJ SOLN
50.0000 ug | Freq: Once | INTRAMUSCULAR | Status: AC
Start: 2017-04-21 — End: 2017-04-21
  Administered 2017-04-21: 50 ug via INTRAVENOUS

## 2017-04-21 MED ORDER — METHOCARBAMOL 1000 MG/10ML IJ SOLN
500.0000 mg | Freq: Four times a day (QID) | INTRAVENOUS | Status: DC | PRN
  Filled 2017-04-21: qty 5

## 2017-04-21 MED ORDER — PHENOL 1.4 % MT LIQD
1.0000 | OROMUCOSAL | Status: DC | PRN
  Filled 2017-04-21: qty 177

## 2017-04-21 MED ORDER — FENTANYL CITRATE (PF) 100 MCG/2ML IJ SOLN
INTRAMUSCULAR | Status: AC
  Filled 2017-04-21: qty 2

## 2017-04-21 MED ORDER — PROPOFOL 10 MG/ML IV BOLUS
INTRAVENOUS | Status: AC
  Filled 2017-04-21: qty 20

## 2017-04-21 MED ORDER — MIDAZOLAM HCL 2 MG/2ML IJ SOLN
INTRAMUSCULAR | Status: AC
  Administered 2017-04-21: 1 mg via INTRAVENOUS
  Filled 2017-04-21: qty 2

## 2017-04-21 MED ORDER — METHOCARBAMOL 500 MG PO TABS: 500.0000 mg | ORAL_TABLET | Freq: Four times a day (QID) | ORAL | Status: DC | PRN

## 2017-04-21 MED ORDER — NEOMYCIN-POLYMYXIN B GU 40-200000 IR SOLN
Status: AC
  Filled 2017-04-21: qty 20

## 2017-04-21 MED ORDER — ACETAMINOPHEN 10 MG/ML IV SOLN
INTRAVENOUS | Status: AC
  Filled 2017-04-21: qty 100

## 2017-04-21 MED ORDER — ONDANSETRON HCL 4 MG/2ML IJ SOLN
INTRAMUSCULAR | Status: AC
Start: 2017-04-21 — End: 2017-04-21
  Filled 2017-04-21: qty 2

## 2017-04-21 MED ORDER — PANTOPRAZOLE SODIUM 40 MG PO TBEC
40.0000 mg | DELAYED_RELEASE_TABLET | Freq: Every day | ORAL | Status: DC
  Administered 2017-04-22: 40 mg via ORAL
  Filled 2017-04-21: qty 1

## 2017-04-21 MED ORDER — OXYCODONE HCL 5 MG PO TABS
10.0000 mg | ORAL_TABLET | ORAL | Status: DC | PRN
  Administered 2017-04-21 – 2017-04-22 (×3): 10 mg via ORAL
  Filled 2017-04-21 (×3): qty 2

## 2017-04-21 MED ORDER — DIPHENHYDRAMINE HCL 12.5 MG/5ML PO ELIX: 12.5000 mg | ORAL_SOLUTION | ORAL | Status: DC | PRN

## 2017-04-21 MED ORDER — METOCLOPRAMIDE HCL 10 MG PO TABS: 5.0000 mg | ORAL_TABLET | Freq: Three times a day (TID) | ORAL | Status: DC | PRN

## 2017-04-21 MED ORDER — OXYCODONE HCL 5 MG PO TABS
5.0000 mg | ORAL_TABLET | ORAL | Status: DC | PRN
  Administered 2017-04-21 – 2017-04-22 (×4): 5 mg via ORAL
  Filled 2017-04-21 (×4): qty 1

## 2017-04-21 MED ORDER — LIDOCAINE HCL (PF) 1 % IJ SOLN
INTRAMUSCULAR | Status: DC | PRN
  Administered 2017-04-21: 1 mL via INTRADERMAL

## 2017-04-21 MED ORDER — SUCCINYLCHOLINE CHLORIDE 20 MG/ML IJ SOLN
INTRAMUSCULAR | Status: DC | PRN
  Administered 2017-04-21: 80 mg via INTRAVENOUS

## 2017-04-21 MED ORDER — SENNOSIDES-DOCUSATE SODIUM 8.6-50 MG PO TABS: 1.0000 | ORAL_TABLET | Freq: Every evening | ORAL | Status: DC | PRN

## 2017-04-21 MED ORDER — CHOLESTYRAMINE 4 G PO PACK
4.0000 g | PACK | Freq: Two times a day (BID) | ORAL | Status: DC | PRN
  Filled 2017-04-21: qty 1

## 2017-04-21 MED ORDER — SODIUM CHLORIDE 0.9 % IV SOLN
INTRAVENOUS | Status: DC
  Administered 2017-04-21: 16:00:00 via INTRAVENOUS

## 2017-04-21 MED ORDER — METOCLOPRAMIDE HCL 5 MG/ML IJ SOLN: 5.0000 mg | Freq: Three times a day (TID) | INTRAMUSCULAR | Status: DC | PRN

## 2017-04-21 MED ORDER — FENTANYL CITRATE (PF) 100 MCG/2ML IJ SOLN
INTRAMUSCULAR | Status: AC
  Administered 2017-04-21: 50 ug via INTRAVENOUS
  Filled 2017-04-21: qty 2

## 2017-04-21 MED ORDER — ACETAMINOPHEN 500 MG PO TABS
1000.0000 mg | ORAL_TABLET | Freq: Three times a day (TID) | ORAL | Status: AC
  Administered 2017-04-21 – 2017-04-22 (×3): 1000 mg via ORAL
  Filled 2017-04-21 (×3): qty 2

## 2017-04-21 MED ORDER — ASPIRIN EC 325 MG PO TBEC
325.0000 mg | DELAYED_RELEASE_TABLET | Freq: Every day | ORAL | Status: DC
  Administered 2017-04-22: 325 mg via ORAL
  Filled 2017-04-21: qty 1

## 2017-04-21 MED ORDER — MENTHOL 3 MG MT LOZG
1.0000 | LOZENGE | OROMUCOSAL | Status: DC | PRN
  Administered 2017-04-21: 3 mg via ORAL
  Filled 2017-04-21 (×2): qty 9

## 2017-04-21 MED ORDER — AZELASTINE HCL 0.1 % NA SOLN
2.0000 | Freq: Every day | NASAL | Status: DC
  Filled 2017-04-21: qty 30

## 2017-04-21 MED ORDER — PROPOFOL 10 MG/ML IV BOLUS
INTRAVENOUS | Status: DC | PRN
  Administered 2017-04-21: 100 mg via INTRAVENOUS

## 2017-04-21 MED ORDER — BISACODYL 10 MG RE SUPP: 10.0000 mg | Freq: Every day | RECTAL | Status: DC | PRN

## 2017-04-21 MED ORDER — HYDROMORPHONE HCL 1 MG/ML IJ SOLN
0.5000 mg | INTRAMUSCULAR | Status: DC | PRN
  Administered 2017-04-21 – 2017-04-22 (×4): 0.5 mg via INTRAVENOUS
  Filled 2017-04-21 (×4): qty 1

## 2017-04-21 MED ORDER — MIDAZOLAM HCL 2 MG/2ML IJ SOLN
1.0000 mg | Freq: Once | INTRAMUSCULAR | Status: AC
  Administered 2017-04-21: 1 mg via INTRAVENOUS

## 2017-04-21 MED ORDER — ONDANSETRON HCL 4 MG PO TABS: 4.0000 mg | ORAL_TABLET | Freq: Four times a day (QID) | ORAL | Status: DC | PRN

## 2017-04-21 MED ORDER — EPHEDRINE SULFATE 50 MG/ML IJ SOLN
INTRAMUSCULAR | Status: AC
  Filled 2017-04-21: qty 1

## 2017-04-21 MED ORDER — OXYCODONE HCL 5 MG PO TABS
5.0000 mg | ORAL_TABLET | Freq: Once | ORAL | Status: DC | PRN
Start: 2017-04-21 — End: 2017-04-21

## 2017-04-21 MED ORDER — LIDOCAINE-EPINEPHRINE 0.5 %-1:200000 IJ SOLN
INTRAMUSCULAR | Status: AC
  Filled 2017-04-21: qty 1

## 2017-04-21 MED ORDER — ALBUTEROL SULFATE (2.5 MG/3ML) 0.083% IN NEBU: 3.0000 mL | INHALATION_SOLUTION | Freq: Four times a day (QID) | RESPIRATORY_TRACT | Status: DC | PRN

## 2017-04-21 MED ORDER — SUGAMMADEX SODIUM 200 MG/2ML IV SOLN
INTRAVENOUS | Status: DC | PRN
  Administered 2017-04-21: 200 mg via INTRAVENOUS

## 2017-04-21 MED ORDER — TRANEXAMIC ACID 1000 MG/10ML IV SOLN
INTRAVENOUS | Status: AC
  Filled 2017-04-21: qty 10

## 2017-04-21 MED ORDER — TRANEXAMIC ACID 1000 MG/10ML IV SOLN
INTRAVENOUS | Status: DC | PRN
  Administered 2017-04-21: 1000 mg via INTRAVENOUS

## 2017-04-21 MED ORDER — CEFAZOLIN SODIUM-DEXTROSE 2-4 GM/100ML-% IV SOLN
INTRAVENOUS | Status: AC
  Filled 2017-04-21: qty 100

## 2017-04-21 MED ORDER — SODIUM CHLORIDE 0.9 % IJ SOLN
INTRAMUSCULAR | Status: AC
  Filled 2017-04-21: qty 50

## 2017-04-21 MED ORDER — ATENOLOL 50 MG PO TABS
50.0000 mg | ORAL_TABLET | Freq: Every day | ORAL | Status: DC
  Filled 2017-04-21: qty 1
  Filled 2017-04-21: qty 2

## 2017-04-21 MED ORDER — BUPIVACAINE-EPINEPHRINE (PF) 0.25% -1:200000 IJ SOLN
INTRAMUSCULAR | Status: AC
  Filled 2017-04-21: qty 30

## 2017-04-21 MED ORDER — FENTANYL CITRATE (PF) 100 MCG/2ML IJ SOLN: 25.0000 ug | INTRAMUSCULAR | Status: DC | PRN

## 2017-04-21 MED ORDER — DOCUSATE SODIUM 100 MG PO CAPS
100.0000 mg | ORAL_CAPSULE | Freq: Two times a day (BID) | ORAL | Status: DC
Start: 2017-04-21 — End: 2017-04-22
  Administered 2017-04-21 – 2017-04-22 (×2): 100 mg via ORAL
  Filled 2017-04-21 (×2): qty 1

## 2017-04-21 MED ORDER — ALUM & MAG HYDROXIDE-SIMETH 200-200-20 MG/5ML PO SUSP: 30.0000 mL | ORAL | Status: DC | PRN

## 2017-04-21 MED ORDER — ROPIVACAINE HCL 5 MG/ML IJ SOLN
INTRAMUSCULAR | Status: DC | PRN
  Administered 2017-04-21: 10 mL via PERINEURAL
  Administered 2017-04-21: 20 mL via PERINEURAL

## 2017-04-21 MED ORDER — ONDANSETRON HCL 4 MG/2ML IJ SOLN: 4.0000 mg | Freq: Four times a day (QID) | INTRAMUSCULAR | Status: DC | PRN

## 2017-04-21 MED ORDER — ROCURONIUM BROMIDE 100 MG/10ML IV SOLN
INTRAVENOUS | Status: DC | PRN
  Administered 2017-04-21 (×6): 10 mg via INTRAVENOUS
  Administered 2017-04-21: 50 mg via INTRAVENOUS

## 2017-04-21 SURGICAL SUPPLY — 65 items
BIT DRILL GUIDE PATIENT MATCH (MISCELLANEOUS) ×1 IMPLANT
BLADE SAGITTAL WIDE XTHICK NO (BLADE) ×2 IMPLANT
BOWL CEMENT MIX W/ADAPTER (MISCELLANEOUS) IMPLANT
BOWL CEMENT MIXING ADV NOZZLE (MISCELLANEOUS) ×4 IMPLANT
CANISTER SUCT 1200ML W/VALVE (MISCELLANEOUS) ×2 IMPLANT
CANISTER SUCT 3000ML PPV (MISCELLANEOUS) ×4 IMPLANT
CAPT SHLDR REVTOTAL 2 ALTIVATE ×2 IMPLANT
CEMENT BONE 1-PACK (Cement) ×4 IMPLANT
CEMENT RESTRICT CLEARCUT 12 (Cement) ×1 IMPLANT
CEMENT RESTRICTOR S12 BIOABORB (Cement) ×1 IMPLANT
CHLORAPREP W/TINT 26ML (MISCELLANEOUS) ×2 IMPLANT
COOLER POLAR GLACIER W/PUMP (MISCELLANEOUS) ×2 IMPLANT
DRAPE IMP U-DRAPE 54X76 (DRAPES) ×4 IMPLANT
DRAPE INCISE IOBAN 66X45 STRL (DRAPES) ×4 IMPLANT
DRAPE SHEET LG 3/4 BI-LAMINATE (DRAPES) ×4 IMPLANT
DRAPE TABLE BACK 80X90 (DRAPES) ×2 IMPLANT
DRAPE U-SHAPE 47X51 STRL (DRAPES) ×4 IMPLANT
DRILL GUIDE PATIENT MATCH (MISCELLANEOUS) ×2
DRSG OPSITE POSTOP 4X8 (GAUZE/BANDAGES/DRESSINGS) ×2 IMPLANT
DRSG TEGADERM 4X4.75 (GAUZE/BANDAGES/DRESSINGS) ×2 IMPLANT
ELECT CAUTERY BLADE 6.4 (BLADE) ×2 IMPLANT
EVACUATOR 1/8 PVC DRAIN (DRAIN) ×2 IMPLANT
GAUZE PACK 2X3YD (MISCELLANEOUS) ×2 IMPLANT
GAUZE PETRO XEROFOAM 1X8 (MISCELLANEOUS) ×2 IMPLANT
GLOVE BIOGEL PI IND STRL 8 (GLOVE) ×1 IMPLANT
GLOVE BIOGEL PI INDICATOR 8 (GLOVE) ×1
GLOVE SURG ORTHO 8.0 STRL STRW (GLOVE) ×4 IMPLANT
GOWN STRL REUS W/ TWL LRG LVL3 (GOWN DISPOSABLE) ×1 IMPLANT
GOWN STRL REUS W/ TWL XL LVL3 (GOWN DISPOSABLE) ×1 IMPLANT
GOWN STRL REUS W/TWL LRG LVL3 (GOWN DISPOSABLE) ×1
GOWN STRL REUS W/TWL XL LVL3 (GOWN DISPOSABLE) ×1
HOOD PEEL AWAY FLYTE STAYCOOL (MISCELLANEOUS) ×6 IMPLANT
IV NS 100ML SINGLE PACK (IV SOLUTION) ×2 IMPLANT
KIT STABILIZATION SHOULDER (MISCELLANEOUS) ×2 IMPLANT
MASK FACE SPIDER DISP (MASK) ×2 IMPLANT
MATCH POINT SYSTEM IMPLANT
NDL SAFETY ECLIPSE 18X1.5 (NEEDLE) ×1 IMPLANT
NEEDLE 18GX1X1/2 (RX/OR ONLY) (NEEDLE) ×2 IMPLANT
NEEDLE HYPO 18GX1.5 SHARP (NEEDLE) ×1
NEEDLE REVERSE CUT 1/2 CRC (NEEDLE) ×2 IMPLANT
NEEDLE SPNL 20GX3.5 QUINCKE YW (NEEDLE) ×2 IMPLANT
NS IRRIG 500ML POUR BTL (IV SOLUTION) ×2 IMPLANT
PACK ARTHROSCOPY SHOULDER (MISCELLANEOUS) ×2 IMPLANT
PAD WRAPON POLAR SHDR UNIV (MISCELLANEOUS) ×1 IMPLANT
PULSAVAC PLUS IRRIG FAN TIP (DISPOSABLE) ×2
SLING ULTRA II LG (MISCELLANEOUS) IMPLANT
SLING ULTRA II M (MISCELLANEOUS) IMPLANT
SOL .9 NS 3000ML IRR  AL (IV SOLUTION) ×1
SOL .9 NS 3000ML IRR UROMATIC (IV SOLUTION) ×1 IMPLANT
SPONGE LAP 18X18 5 PK (GAUZE/BANDAGES/DRESSINGS) ×8 IMPLANT
STAPLER SKIN PROX 35W (STAPLE) ×2 IMPLANT
STEM HUMERAL REVERSE S 12X108 (Stem) IMPLANT
STRAP SAFETY BODY (MISCELLANEOUS) ×2 IMPLANT
SUT FIBERWIRE #2 38 BLUE 1/2 (SUTURE) ×8
SUT MNCRL AB 4-0 PS2 18 (SUTURE) ×2 IMPLANT
SUT TICRON 2-0 30IN 311381 (SUTURE) ×6 IMPLANT
SUT VIC AB 0 CT1 36 (SUTURE) ×2 IMPLANT
SUT VIC AB 2-0 CT2 27 (SUTURE) ×2 IMPLANT
SUTURE FIBERWR #2 38 BLUE 1/2 (SUTURE) ×4 IMPLANT
SYR 20CC LL (SYRINGE) ×2 IMPLANT
SYR 30ML LL (SYRINGE) ×2 IMPLANT
SYRINGE IRR TOOMEY STRL 70CC (SYRINGE) ×2 IMPLANT
SYS SHLDR CAPITATED REV 2 ×1 IMPLANT
TIP FAN IRRIG PULSAVAC PLUS (DISPOSABLE) ×1 IMPLANT
WRAPON POLAR PAD SHDR UNIV (MISCELLANEOUS) ×2

## 2017-04-21 NOTE — Anesthesia Preprocedure Evaluation (Addendum)
Anesthesia Evaluation  Patient identified by MRN, date of birth, ID band Patient awake    Reviewed: Allergy & Precautions, H&P , NPO status , Patient's Chart, lab work & pertinent test results  History of Anesthesia Complications Negative for: history of anesthetic complications  Airway Mallampati: III  TM Distance: <3 FB Neck ROM: full    Dental  (+) Poor Dentition, Chipped, Missing, Partial Upper   Pulmonary asthma , pneumonia, resolved, former smoker,           Cardiovascular Exercise Tolerance: Good hypertension, (-) angina(-) Past MI and (-) DOE + Valvular Problems/Murmurs MVP      Neuro/Psych  Headaches, PSYCHIATRIC DISORDERS Anxiety Depression  Neuromuscular disease    GI/Hepatic Neg liver ROS, GERD  Controlled,  Endo/Other  negative endocrine ROS  Renal/GU      Musculoskeletal   Abdominal   Peds  Hematology negative hematology ROS (+)   Anesthesia Other Findings Past Medical History: No date: Anxiety No date: Asthma No date: GERD (gastroesophageal reflux disease) No date: Headache     Comment:  Migraines No date: Hypertension No date: Idiopathic thrombocytopenia purpura (HCC)     Comment:  Required splenectomy No date: MVP (mitral valve prolapse) No date: Neuromuscular disorder [facility])     Comment:  Restless Leg Syndrome April 2017 [facility]: Pancreatitis     Comment:  Lkely due to cholecystitis.  No date: Pneumonia     Comment:  06-2015  Past Surgical History: No date: ABDOMINAL HYSTERECTOMY No date: APPENDECTOMY 11/19/2014: CARPAL TUNNEL RELEASE; Bilateral     Comment:  Procedure: CARPAL TUNNEL RELEASE;  Surgeon: Donato Heinz, MD;  Location: [facility] ORS;  Service: Orthopedics;                Laterality: Bilateral; No date: CESAREAN SECTION     Comment:  times 2 09/29/2015: CHOLECYSTECTOMY     Comment:  [facility] 05/02/2016: COLONOSCOPY; N/A     Comment:  Procedure: COLONOSCOPY;  Surgeon:  Scot Jun, MD;               Location: [facility] ENDOSCOPY;  Service: Endoscopy;                Laterality: N/A; 05/02/2016: ESOPHAGOGASTRODUODENOSCOPY (EGD) WITH PROPOFOL; N/A     Comment:  Procedure: ESOPHAGOGASTRODUODENOSCOPY (EGD) WITH               PROPOFOL;  Surgeon: Scot Jun, MD;  [facility]              ENDOSCOPY;  Service: Endoscopy;  Laterality: N/A; No date: HERNIA REPAIR     Comment:  times 3 No date: OVARIAN CYST SURGERY No date: SHOULDER ARTHROSCOPY WITH ROTATOR CUFF REPAIR     Comment:  times 2 No date: SPLENECTOMY, TOTAL 11/19/2014: TRIGGER FINGER RELEASE; Right     Comment:  Procedure: RELEASE TRIGGER FINGER/A-1 PULLEY;  Surgeon:               Donato Heinz, MD;  Location: [facility] ORS;  Service:               Orthopedics;  Laterality: Right; 08/12/2015: TRIGGER FINGER RELEASE; Right     Comment:  Procedure: RELEASE TRIGGER LONG FINGER;  Surgeon: Donato Heinz, MD;  Location: [facility] ORS;  Service: Orthopedics;  Laterality: Right;  BMI    Body Mass Index:  23.54 kg/m      Reproductive/Obstetrics negative OB ROS                             Anesthesia Physical Anesthesia Plan  ASA: III  Anesthesia Plan: General ETT   Post-op Pain Management: GA combined w/ Regional for post-op pain   Induction: Intravenous  PONV Risk Score and Plan: 3 and Ondansetron, Dexamethasone, Midazolam and Treatment may vary due to age or medical condition  Airway Management Planned: Oral ETT  Additional Equipment:   Intra-op Plan:   Post-operative Plan: Extubation in OR  Informed Consent: I have reviewed the patients History and Physical, chart, labs and discussed the procedure including the risks, benefits and alternatives for the proposed anesthesia with the patient or authorized representative who has indicated his/her understanding and acceptance.   Dental Advisory Given  Plan Discussed with: Anesthesiologist,  CRNA and Surgeon  Anesthesia Plan Comments: (Patient consented for risks of anesthesia including but not limited to:  - adverse reactions to medications - damage to teeth, lips or other oral mucosa - sore throat or hoarseness - Damage to heart, brain, lungs or loss of life  Patient voiced understanding.)       Anesthesia Quick Evaluation

## 2017-04-21 NOTE — Anesthesia Procedure Notes (Signed)
Anesthesia Regional Block: Interscalene brachial plexus block   Pre-Anesthetic Checklist: ,, timeout performed, Correct Patient, Correct Site, Correct Laterality, Correct Procedure, Correct Position, site marked, Risks and benefits discussed,  Surgical consent,  Pre-op evaluation,  At surgeon's request and post-op pain management  Laterality: Upper and Right  Prep: chloraprep       Needles:  Injection technique: Single-shot  Needle Type: Stimiplex     Needle Length: 5cm  Needle Gauge: 22     Additional Needles:   Narrative:  Start time: 04/21/2017 7:15 AM End time: 04/21/2017 7:18 AM Injection made incrementally with aspirations every 5 mL.  Performed by: Personally  Anesthesiologist: Margorie JohnPISCITELLO, JOSEPH K  Additional Notes: Patient reports baseline weakness in right shoulder pre block  Functioning IV was confirmed and monitors were applied.  A 50mm 22ga Stimuplex needle was used. Sterile prep,hand hygiene and sterile gloves were used.  Minimal sedation used for procedure.  No paresthesia endorsed by patient during the procedure.  Negative aspiration and negative test dose prior to incremental administration of local anesthetic. The patient tolerated the procedure well with no immediate complications.

## 2017-04-21 NOTE — Op Note (Signed)
Operative Note   SURGERY DATE: 04/21/2017  PRE-OP DIAGNOSIS:  1. Right shoulder rotator cuff arthropathy  POST-OP DIAGNOSIS: 1. Right shoulder rotator cuff arthropathy  PROCEDURES:  1. Right reverse total shoulder arthroplasty 2. Right biceps tenodesis  SURGEON: Cato Mulligan, MD  ASSISTANTS: Reche Dixon, PA  ANESTHESIA: Gen + interscalene block  ESTIMATED BLOOD LOSS:150cc  TOTAL IV FLUIDS: 1247m  IMPLANTS: [employer]: RSP Glenoid Head w/Retaining screw 32-4; Monoblock Reverse Shoulder Baseplate with 60.0LKcentral screw; 4 locking screws into baseplate (191PH 215AV 169VX 155m; Small Shell Humeral Stem 10 x10856mNeutral Small Socket Insert; Bio-absorbable cement restrictor (76m54m INDICATION(S):  Heidi Hernandez 66 y35. female with chronic shoulder pain.  She is currently unable to lift her arm over her head. She has had a prior rotator cuff repair ~8 years ago with current tear at the level of the glenoid. She has significant supraspinatus as well as subscapularis muscle atrophy. She also has a large subchondral cyst below the greater tuberosity. Conservative measures including medications and cortisone injections have not provided adequate relief. After discussion of risks, benefits, and alternatives to surgery, the patient elected to proceed with reverse shoulder arthroplasty and biceps tenodesis  OPERATIVE FINDINGS: massive rotator cuff tear (complete supraspinatus, partial infraspinatus, partial subscapularis), prior proximal biceps rupture  OPERATIVE REPORT:  I identified Heidi Salasridgein the pre-operative holding area. Informed consent was obtained and the surgical site was marked. I reviewed the risks and benefits of the proposed surgical intervention and the patient (and/or patient's guardian) wished to proceed. An interscalene block was administered by the Anesthesia team. The patient was transferred to the operative suite and general anesthesia  was administered. The patient was placed in the beach chair position with the head of the bed elevated approximately 45 degrees. All down side pressure points were appropriately padded. Pre-op exam under anesthesia confirmed some stiffness and crepitus. Appropriate IV antibioticswere administered within 30 minutes before incision. Tranexamic acid was also administered after verifying that the patient had no contraindications. The extremity was then prepped and draped in standard fashion. A time out was performed confirming the correct extremity, correct patient, and correct procedure.   We used the standard deltopectoral incision from the coracoid to ~12cm distal. We found the cephalic vein and took it laterally. We opened the deltopectoral interval widely and placed retractors under the CA ligament in the subacromial space and under the deltoid tendon at its insertion. We then abducted and internally rotated the arm and released the underlying bursa between these retractors, taking care not to damage the circumflex branch of the axillary nerve.   Next, we brought the arm back in adduction at slight forward flexion with external rotation. We opened the clavipectoral fascia lateral to the conjoint tendon. We gently palpated the axillary nerve and verified its position and continuity on both sides of the humerus with a Tug test. Note, this test was repeated multiple times during the procedure for nerve localization and confirmed to be intact at the end of the case. We then cauterized the anterior humeral circumflex ("Three sisters") vessels. The arm was then internally rotated, we cut the falciform ligament at approximately 1 cm of the upper portion of the pectoralis major insertion. Next we unroofed the bicipital groove. The biceps tendon had already been ruptured proximally. We proceeded with a soft tissue biceps tenodesis given the pathology of the tendon.  After opening the biceps tendon sheath all the way to  the supraglenoid tubercle, we performed  a biceps tenodesis with two #2 TiCron sutures to the upper border of the pectoralis major. The proximal portion of the tendon was excised.   At this point, we could see that the supraspinatus was completely torn with a bald humeral head superiorly. The subscapularis also had a partial tear of the superior fibers. We performed a subscapularis peel using electrocautery to remove the anterior capsule and subscapularis off of the humeral head. We released the inferior capsule from the humerus all the way to the posterior band of the inferior glenohumeral ligament. When this was complete we gently dislocated the shoulder up into the wound. We removed the osteophytes and made our cut with the appropriate inclination in 30 degrees of retroversion. The subchondral cyst was identified and cleared out. There were very thin walls of some of the proximal humeral metaphysis and these were removed with a rongeur.  We then turned our attention back to the glenoid.A Fukuda retractor was used to retract the proximal humerus posteriorly. The anterior capsule was dissected free from the subscapularis. The anterior capsule was then excised, exposing the anterior glenoid. We then grasped the labrum and removed it circumferentially. During the glenoid exposure, the axillary nerve was protected the entire time.    A patient-specific guide was used to drill the central guidepin. A tap was placed, matching the trajectory of the guidepin. An appropriately sized reamer was used to ream the glenoid. The monoblock baseplate was inserted and excellent fixation was achieved such that the entire scapula rotated with further attempted seating of the baseplate. The 4 peripheral screws were drilled, measured, and placed. A 32-4 glenosphere was then placed and tightened.   We then turned our attention back to the humerus. We sized for a small-shell prosthesis. We sequentially used larger diameter canal  finders until we met significant resistance at a size 12. We sequentially broached to a size 12. We trialed both a 0 and +4 poly. The +4 poly was extremely difficult to dislocate. During dislocation maneuvers, the cephalic vein was torn. It was then coagulated. The humerus was trialed and noted to have satisfactory stability, motion, and deltoid tension. We decided to proceed with a 0 poly. We drilled 3 holes and passed 3 #2Fiberwire sutures through for our subscapularis repair. During preparation of the humeral stem for implantation, it became contaminated. This was the only 76m small shell stem. We attempted to use a 158mstem, but this did not have good fit. A 1467mtem would have been too large and there was no 18m69mall shell. We therefore, decided to use a 10mm24mm and use cement for fixation. A 12mm 25mnt restrictor was placed at the appropriate depth. The wound and humeral canal were thoroughly irrigated with pulse lavage. The canal was dried. Pressurized cement was inserted into the humeral canal. The final humeral component was then inserted. It was held in place until the cement hardened. Excess cement was removed. We placed an 0 poly. The humerus was reduced and final confirmation of motion, tension, and stability were satisfactory. A Hemovac drain was placed. subscapularis was repaired with the previously passed #2 FiberWire sutures after passing them through the subscapularis.   We again verified the tension on the axillary nerve, appropriate range of motion, stability of the implant, and security of the subscapularis repair. We closed the deltopectoral interval deep to the cephalic vein with a running, 0-Vicryl suture. The skin was closed with 2-0 Vicryl and 4-0 Monocryl. Xeroform and Honeycomb dressing was applied. A  PolarCare unit and sling were placed. Patient was extubated, transferred to a stretcher bed and to the post antesthesia care unit in stable condition.   POSTOPERATIVE PLAN: The  patient will be admitted with plan for discharge home on POD#1. Operative arm to remain in sling at all times except RoM exercises and hygiene. Can perform pendulums, elbow/wrist/hand RoM exercises. Passive RoM allowed to 90 FF and 30 ER. ASA 353m x 6 weeks for DVT ppx. Plan for outpatient PT starting on POD #3-4. Patient to return to clinic in ~2 weeks for post-operative appointment.

## 2017-04-21 NOTE — H&P (Signed)
Paper H&P to be scanned into permanent record. H&P reviewed. No significant changes noted.  

## 2017-04-21 NOTE — OR Nursing (Signed)
Right humeral head was not sent to pathology per doctor's orders.

## 2017-04-21 NOTE — Transfer of Care (Signed)
Immediate Anesthesia Transfer of Care Note  Patient: Heidi Hernandez  Procedure(s) Performed: REVERSE SHOULDER ARTHROPLASTY (Right )  Patient Location: PACU  Anesthesia Type:General  Level of Consciousness: awake  Airway & Oxygen Therapy: Patient connected to face mask oxygen  Post-op Assessment: Post -op Vital signs reviewed and stable  Post vital signs: stable  Last Vitals:  Vitals:   04/21/17 0720 04/21/17 1333  BP: 109/61 120/67  Pulse: 61 82  Resp: 20 16  Temp:  (!) 36.1 C  SpO2: 99% 96%    Last Pain:  Vitals:   04/21/17 1333  TempSrc: Temporal  PainSc:          Complications: No apparent anesthesia complications

## 2017-04-21 NOTE — Anesthesia Post-op Follow-up Note (Signed)
Anesthesia QCDR form completed.        

## 2017-04-21 NOTE — Anesthesia Procedure Notes (Signed)
Procedure Name: Intubation Date/Time: 04/21/2017 7:35 AM Performed by: Irving BurtonBACHICH, Eliyanna Ault Pre-anesthesia Checklist: Patient identified, Emergency Drugs available, Suction available and Patient being monitored Patient Re-evaluated:Patient Re-evaluated prior to induction Oxygen Delivery Method: Circle system utilized Preoxygenation: Pre-oxygenation with 100% oxygen Induction Type: IV induction Ventilation: Mask ventilation without difficulty Laryngoscope Size: McGraph and 3 Grade View: Grade I Tube type: Oral Tube size: 7.0 mm Number of attempts: 2 Airway Equipment and Method: Stylet and Video-laryngoscopy Placement Confirmation: ETT inserted through vocal cords under direct vision,  positive ETCO2 and breath sounds checked- equal and bilateral Secured at: 21 cm Tube secured with: Tape Dental Injury: Teeth and Oropharynx as per pre-operative assessment  Difficulty Due To: Difficult Airway- due to anterior larynx

## 2017-04-22 LAB — BASIC METABOLIC PANEL
ANION GAP: 6 (ref 5–15)
BUN: 18 mg/dL (ref 6–20)
CALCIUM: 8.2 mg/dL — AB (ref 8.9–10.3)
CO2: 24 mmol/L (ref 22–32)
CREATININE: 0.67 mg/dL (ref 0.44–1.00)
Chloride: 109 mmol/L (ref 101–111)
Glucose, Bld: 146 mg/dL — ABNORMAL HIGH (ref 65–99)
Potassium: 3.7 mmol/L (ref 3.5–5.1)
SODIUM: 139 mmol/L (ref 135–145)

## 2017-04-22 LAB — CBC
HEMATOCRIT: 33.7 % — AB (ref 35.0–47.0)
Hemoglobin: 11.5 g/dL — ABNORMAL LOW (ref 12.0–16.0)
MCH: 32.6 pg (ref 26.0–34.0)
MCHC: 34.2 g/dL (ref 32.0–36.0)
MCV: 95.1 fL (ref 80.0–100.0)
Platelets: 115 10*3/uL — ABNORMAL LOW (ref 150–440)
RBC: 3.55 MIL/uL — ABNORMAL LOW (ref 3.80–5.20)
RDW: 14.4 % (ref 11.5–14.5)
WBC: 13.8 10*3/uL — AB (ref 3.6–11.0)

## 2017-04-22 MED ORDER — OXYCODONE HCL 5 MG PO TABS: 5.0000 mg | ORAL_TABLET | ORAL | 0 refills | Status: DC | PRN

## 2017-04-22 MED ORDER — ACETAMINOPHEN 500 MG PO TABS: 1000.0000 mg | ORAL_TABLET | Freq: Three times a day (TID) | ORAL | 2 refills | Status: AC

## 2017-04-22 MED ORDER — ASPIRIN 325 MG PO TBEC: 325.0000 mg | DELAYED_RELEASE_TABLET | Freq: Every day | ORAL | 0 refills | Status: DC

## 2017-04-22 NOTE — Plan of Care (Signed)
Problem: Acute Rehab PT Goals(only PT should resolve) Goal: Pt Will Verbalize and Adhere to Precautions While PT Will Verbalize and Adhere to Precautions While Performing Mobility Shoulder

## 2017-04-22 NOTE — Anesthesia Postprocedure Evaluation (Signed)
Anesthesia Post Note  Patient: Heidi PaoCarol M Hernandez  Procedure(s) Performed: REVERSE SHOULDER ARTHROPLASTY (Right )  Patient location during evaluation: PACU Anesthesia Type: General Level of consciousness: awake and alert Pain management: pain level controlled Vital Signs Assessment: post-procedure vital signs reviewed and stable Respiratory status: spontaneous breathing, nonlabored ventilation, respiratory function stable and patient connected to nasal cannula oxygen Cardiovascular status: blood pressure returned to baseline and stable Postop Assessment: no apparent nausea or vomiting Anesthetic complications: no     Last Vitals:  Vitals:   04/22/17 0943 04/22/17 1115  BP: (!) 104/43 (!) 117/59  Pulse: 74 69  Resp:  18  Temp:  37.1 C  SpO2:  94%    Last Pain:  Vitals:   04/22/17 1115  TempSrc: Oral  PainSc:                  Cleda MccreedyJoseph K Marnie Fazzino

## 2017-04-22 NOTE — Evaluation (Signed)
Occupational Therapy Evaluation Patient Details Name: Heidi Hernandez MRN: [id number] DOB: 10/25/1950 Today's Date: 04/22/2017    History of Present Illness Pt. is a 66 y.o. female who was admitted to [facility] for a right reverse Total shoulder replacement, and right Biceps tenodesis. pt. PMHx includes: Anxiety, GERD, HTN, Idiopathic Thrombocytopenia [place], Restless leg syndrome, Pancreatitis, Pneumonia   Clinical Impression   Pt. is a 66 y.o. female who was admitted to [facility] for a right reverse total shoulder replacement, and biceps tenodesis. Pt. presents with pain, dominant RUE immobilization which hinders her ability to complete ADL, and IADL functioning. Pt. resides at home with her husband, who will assist pt. with ADL tasks as needed. Pt. reports having had multiple UE surgeries in the past, and has a system in place that has worked her in the past. Reviewed ADL tasks with pt., and compensatory strategies. Pt. could benefit from skilled OT services for ADL training, A/E training, UE there. Ex, there. Act., and pt. education about DME, and home modification. Pt. plans to return home with her husband upon discharge. Pt. Could benefit from a shower chair.      Follow Up Recommendations  No OT follow up    Equipment Recommendations       Recommendations for Other Services       Precautions / Restrictions Precautions Precautions: Fall;Shoulder Type of Shoulder Precautions: Passive Protocol, Pendulums, forward flexion 0-90, external rotation 0-30. No AROM of the Right shoulder. AROM elbow, wrist, and hand. Shoulder Interventions: Shoulder sling/immobilizer;Shoulder abduction pillow;At all times Required Braces or Orthoses: Sling Restrictions Weight Bearing Restrictions: Yes RUE Weight Bearing: Non weight bearing             ADL either performed or assessed with clinical judgement   ADL Overall ADL's : Needs assistance/impaired Eating/Feeding: Set up   Grooming: Set up   Upper  Body Bathing: Minimal assistance   Lower Body Bathing: Minimal assistance   Upper Body Dressing : Moderate assistance   Lower Body Dressing: Minimal assistance               Functional mobility during ADLs: Min guard General ADL Comments: Pt. education was provided about A/E use.     Vision Baseline Vision/History: Wears glasses Wears Glasses: At all times       Perception     Praxis      Pertinent Vitals/Pain Pain Assessment: 0-10 Pain Score: 5  Pain Location: Shoulder Pain Descriptors / Indicators: Aching Pain Intervention(s): Limited activity within patient's tolerance;Monitored during session     Hand Dominance Right   Extremity/Trunk Assessment Upper Extremity Assessment Upper Extremity Assessment: RUE deficits/detail;Overall WFL for tasks assessed RUE Deficits / Details: R shoulder ROM not assessed due to precautions; AROM elbow/wrist/hand WNL; sensation and capillary refill intact RUE: Unable to fully assess due to immobilization       Communication Communication Communication: No difficulties   Cognition Arousal/Alertness: Awake/alert Behavior During Therapy: WFL for tasks assessed/performed Overall Cognitive Status: Within Functional Limits for tasks assessed                                     General Comments       Exercises   Shoulder Instructions      Home Living Family/patient expects to be discharged to:: Private residence Living Arrangements: Spouse/significant other Available Help at Discharge: Family;Available 24 hours/day Type of Home: House Home Access: Stairs to enter  Entrance Stairs-Number of Steps: 4 Entrance Stairs-Rails: Can reach both Home Layout: Two level;Able to live on main level with bedroom/bathroom     Bathroom Shower/Tub: Chief Strategy OfficerTub/shower unit   Bathroom Toilet: Standard     Home Equipment: None (Pt. reports having a reacher)          Prior Functioning/Environment Level of Independence:  Independent                 OT Problem List: Decreased range of motion;Decreased knowledge of use of DME or AE;Pain;Impaired UE functional use      OT Treatment/Interventions: Self-care/ADL training;Therapeutic exercise;Patient/family education;Therapeutic activities    OT Goals(Current goals can be found in the care plan section) Acute Rehab OT Goals Patient Stated Goal: To return home OT Goal Formulation: With patient Potential to Achieve Goals: Good  OT Frequency: Min 1X/week   Barriers to D/C:            Co-evaluation              AM-PAC PT "6 Clicks" Daily Activity     Outcome Measure Help from another person eating meals?: A Little Help from another person taking care of personal grooming?: A Little Help from another person toileting, which includes using toliet, bedpan, or urinal?: A Little Help from another person bathing (including washing, rinsing, drying)?: A Little Help from another person to put on and taking off regular upper body clothing?: A Little Help from another person to put on and taking off regular lower body clothing?: A Little 6 Click Score: 18   End of Session    Activity Tolerance: Patient tolerated treatment well Patient left: in bed;with call bell/phone within reach;with bed alarm set  OT Visit Diagnosis: Muscle weakness (generalized) (M62.81)                Time: [id number] OT Time Calculation (min): 22 min Charges:  OT General Charges $OT Visit: 1 Visit OT Evaluation $OT Eval Moderate Complexity: 1 Mod G-Codes: OT G-codes **NOT FOR INPATIENT CLASS** Functional Limitation: Self care Self Care Current Status (V4098(G8987): At least 20 percent but less than 40 percent impaired, limited or restricted Self Care Goal Status (J1914(G8988): 0 percent impaired, limited or restricted   Olegario MessierElaine Lonnel Gjerde, MS, OTR/L   Olegario MessierElaine Nolyn Eilert, MS, OTR/L 04/22/2017, 11:00 AM

## 2017-04-22 NOTE — Care Management Note (Signed)
Case Management Note  Patient Details  Name: Heidi Hernandez MRN: [id number] Date of Birth: 10/19/1950  Subjective/Objective:     Discussed discharge planning with Mrs [name]. Mrs [name] reports that [facility]. She is aware that insurance will not pay for a shower chair and [facility] Has a polar-care unit at bedside.               Action/Plan:   Expected Discharge Date:  04/22/17               Expected Discharge Plan:     In-House Referral:     Discharge planning Services     Post Acute Care Choice:    Choice offered to:     DME Arranged:    DME Agency:     HH Arranged:    HH Agency:     Status of Service:     If discussed at [employer] Meetings, dates discussed:    Additional Comments:  Yarethzy Croak A, RN 04/22/2017, 9:53 AM

## 2017-04-22 NOTE — Plan of Care (Signed)
Problem: Pain Managment: Goal: General experience of comfort will improve Outcome: Not Progressing Pt is in severe pain throughout the night with pain medications and repositioning. MD notified. VSS. Will continue to monitor.

## 2017-04-22 NOTE — Plan of Care (Signed)
Problem: Activity: Goal: Risk for activity intolerance will decrease Outcome: Progressing Pt ambulated to the bathroom and back to her bed this shift. Tolerated well.

## 2017-04-22 NOTE — Progress Notes (Signed)
Pt alert and oriented X4 resting in room. Family at bedside. Pt on room air. IV removed. Discharge instructions and prescriptions given to patient. Awaiting discharge.

## 2017-04-22 NOTE — Progress Notes (Signed)
  Subjective: 1 Day Post-Op Procedure(s) (LRB): REVERSE SHOULDER ARTHROPLASTY (Right) Patient reports pain as moderate.   Patient seen in rounds with Dr. Allena KatzPatel. Patient is well, and has had no acute complaints or problems Plan is to go Home after hospital stay. Negative for chest pain and shortness of breath Fever: no Gastrointestinal: Negative for nausea and vomiting  Objective: Vital signs in last 24 hours: Temp:  [97 F (36.1 C)-98.9 F (37.2 C)] 97.9 F (36.6 C) (10/27 0419) Pulse Rate:  [52-82] 68 (10/27 0419) Resp:  [15-22] 16 (10/27 0419) BP: (90-169)/(44-98) 108/50 (10/27 0419) SpO2:  [92 %-99 %] 93 % (10/27 0419)  Intake/Output from previous day:  Intake/Output Summary (Last 24 hours) at 04/22/17 0709 Last data filed at 04/22/17 0603  Gross per 24 hour  Intake          3061.25 ml  Output              590 ml  Net          2471.25 ml    Intake/Output this shift: No intake/output data recorded.  Labs:  Recent Labs  04/22/17 0409  HGB 11.5*    Recent Labs  04/22/17 0409  WBC 13.8*  RBC 3.55*  HCT 33.7*  PLT 115*    Recent Labs  04/22/17 0409  NA 139  K 3.7  CL 109  CO2 24  BUN 18  CREATININE 0.67  GLUCOSE 146*  CALCIUM 8.2*   No results for input(s): LABPT, INR in the last 72 hours.   EXAM General - Patient is Alert and Oriented Extremity - Sensation intact distally Compartment soft Dressing/Incision - clean, dry, no drainage, Hemovac removed with minimal drainage Motor Function - intact, moving fingers and wrist well on exam.   Past Medical History:  Diagnosis Date  . Anxiety   . Asthma   . GERD (gastroesophageal reflux disease)   . Headache    Migraines  . Hypertension   . Idiopathic thrombocytopenia purpura (HCC)    Required splenectomy  . MVP (mitral valve prolapse)   . Neuromuscular disorder (HCC)    Restless Leg Syndrome  . [facility]   [place] due to cholecystitis.   . Pneumonia    06-2015     Assessment/Plan: 1 Day Post-Op Procedure(s) (LRB): REVERSE SHOULDER ARTHROPLASTY (Right) Active Problems:   Status post shoulder replacement  Estimated body mass index is 23.54 kg/m as calculated from the following:   Height as of this encounter: 5' 3.5" (1.613 m).   Weight as of this encounter: 61.2 kg (135 lb). Advance diet Up with therapy D/C IV fluids Plan to discharge home today  DVT Prophylaxis - Aspirin Weight-Bearing as tolerated with the shoulder immobilizer in place  Dedra Skeensodd Natash Berman, PA-C Orthopaedic Surgery 04/22/2017, 7:09 AM

## 2017-04-22 NOTE — Discharge Summary (Signed)
Physician Discharge Summary  Subjective: 1 Day Post-Op Procedure(s) (LRB): REVERSE SHOULDER ARTHROPLASTY (Right) Patient reports pain as moderate.   Patient seen in rounds with Dr. Allena KatzPatel. Patient is well, and has had no acute complaints or problems Patient is ready to go home  Physician Discharge Summary  Patient ID: Heidi Hernandez MRN: [id number] DOB/AGE: 68/12/1950 68 y.o.  Admit date: 04/21/2017 Discharge date: 04/22/2017  Admission Diagnoses:  Discharge Diagnoses:  Active Problems:   Status post shoulder replacement   Discharged Condition: fair  Hospital Course: The patient is postop day 1 from a left reverse total shoulder replacement done by Dr. Allena KatzPatel. She had significant pain last night and is now becoming more controlled. She has been in her shoulder immobilizer for protection. She has gotten up and moved around to the bathroom. She is ready to go home today. Her blood pressure has been mildly hypotensive, but her vitals are stable.  Treatments: surgery:  1. Right reverse total shoulder arthroplasty 2. Right biceps tenodesis  SURGEON: Rosealee AlbeeSunny H. Leilanny Fluitt, MD  ASSISTANTS: Dedra Skeensodd Mundy, PA  ANESTHESIA: Gen + interscalene block  ESTIMATED BLOOD LOSS:150cc  TOTAL IV FLUIDS: 1200ml  IMPLANTS: [employer] Surgical: RSP Glenoid Head w/Retaining screw 32-4; Monoblock Reverse Shoulder Baseplate with 6.535mm central screw; 4 locking screws into baseplate (14mm, 22mm, 18mm, 14mm); Small Shell Humeral Stem 10 x13308mm; Neutral Small Socket Insert; Bio-absorbable cement restrictor (12mm).   Discharge Exam: Blood pressure (!) 108/50, pulse 68, temperature 97.9 F (36.6 C), temperature source Oral, resp. rate 16, height 5' 3.5" (1.613 m), weight 61.2 kg (135 lb), SpO2 93 %.   Disposition: 01-Home or Self Care   Allergies as of 04/22/2017      Reactions   Droperidol Other (See Comments)   "Locked jaw" Facial muscles locked   Morphine Itching   Paxil [paroxetine Hcl] Other (See  Comments)   syncope      Medication List    TAKE these medications   acetaminophen 500 MG tablet Commonly known as:  TYLENOL Take 2 tablets (1,000 mg total) by mouth every 8 (eight) hours.   albuterol 108 (90 Base) MCG/ACT inhaler Commonly known as:  VENTOLIN HFA Inhale 2 puffs into the lungs every 6 (six) hours as needed for wheezing. May substitute generic or equivalent brand name   alendronate 70 MG tablet Commonly known as:  FOSAMAX Take 1 tablet (70 mg total) by mouth once a week.   ALLEGRA PO Take 1 capsule by mouth daily.   aspirin 325 MG EC tablet Take 1 tablet (325 mg total) by mouth daily.   atenolol 50 MG tablet Commonly known as:  TENORMIN TAKE 1 TABLET BY MOUTH ONCE DAILY What changed:  See the new instructions.   azelastine 0.1 % nasal spray Commonly known as:  ASTELIN Place 2 sprays into both nostrils daily.   cholestyramine 4 GM/DOSE powder Commonly known as:  QUESTRAN Take 1 packet (4 g total) by mouth 2 (two) times daily with a meal. What changed:  when to take this  reasons to take this   fluticasone 44 MCG/ACT inhaler Commonly known as:  FLOVENT HFA Inhale 1 puff into the lungs daily.   fluticasone 50 MCG/ACT nasal spray Commonly known as:  FLONASE Place 2 sprays into both nostrils daily.   oxyCODONE 5 MG immediate release tablet Commonly known as:  Oxy IR/ROXICODONE Take 1 tablet (5 mg total) by mouth every 4 (four) hours as needed for moderate pain ((score 4 to 6)).   pramipexole 1 MG  tablet Commonly known as:  MIRAPEX TAKE 1 TABLET BY MOUTH TWICE DAILY   RABEprazole 20 MG tablet Commonly known as:  ACIPHEX Take 20 mg by mouth 2 (two) times daily.   sucralfate 1 g tablet Commonly known as:  CARAFATE Take 1 g by mouth 2 (two) times daily.   SUMAtriptan 100 MG tablet Commonly known as:  IMITREX TAKE 1 AT ONSET OF HEADACHE.  MAY TAKE AN ADDITIONAL TABLET IN 2 HOURS IF NEEEDED BUT ONLY 2 TABLETS IN 24 HOURS What changed:  See the  new instructions.      Follow-up Information    Signa Kell, MD Follow up in 2 week(s).   Specialty:  Orthopedic Surgery Why:  For staple removal Contact information: [address] [place] [address] [phone]           Signed: Lenard Forth, TODD 04/22/2017, 7:19 AM   Objective: Vital signs in last 24 hours: Temp:  [97 F (36.1 C)-98.9 F (37.2 C)] 97.9 F (36.6 C) (10/27 0419) Pulse Rate:  [54-82] 68 (10/27 0419) Resp:  [16-20] 16 (10/27 0419) BP: (90-169)/(44-98) 108/50 (10/27 0419) SpO2:  [92 %-99 %] 93 % (10/27 0419)  Intake/Output from previous day:  Intake/Output Summary (Last 24 hours) at 04/22/17 0719 Last data filed at 04/22/17 0603  Gross per 24 hour  Intake          3061.25 ml  Output              590 ml  Net          2471.25 ml    Intake/Output this shift: No intake/output data recorded.  Labs:  Recent Labs  04/22/17 0409  HGB 11.5*    Recent Labs  04/22/17 0409  WBC 13.8*  RBC 3.55*  HCT 33.7*  PLT 115*    Recent Labs  04/22/17 0409  NA 139  K 3.7  CL 109  CO2 24  BUN 18  CREATININE 0.67  GLUCOSE 146*  CALCIUM 8.2*   No results for input(s): LABPT, INR in the last 72 hours.  EXAM: General - Patient is Alert and Oriented Extremity - Sensation intact distally Compartment soft Incision - clean, dry, no drainage, Hemovac removed Motor Function -  wrist and hand dorsiflexion and volar flexion intact.  Assessment/Plan: 1 Day Post-Op Procedure(s) (LRB): REVERSE SHOULDER ARTHROPLASTY (Right) Procedure(s) (LRB): REVERSE SHOULDER ARTHROPLASTY (Right) Past Medical History:  Diagnosis Date  . Anxiety   . Asthma   . GERD (gastroesophageal reflux disease)   . Headache    Migraines  . Hypertension   . Idiopathic thrombocytopenia purpura (HCC)    Required splenectomy  . MVP (mitral valve prolapse)   . Neuromuscular disorder (HCC)    Restless Leg Syndrome  . Pancreatitis April 2017 [facility] due to  cholecystitis.   . Pneumonia    06-2015   Active Problems:   Status post shoulder replacement  Estimated body mass index is 23.54 kg/m as calculated from the following:   Height as of this encounter: 5' 3.5" (1.613 m).   Weight as of this encounter: 61.2 kg (135 lb). Advance diet Up with therapy D/C IV fluids  Discharge home. Diet - Regular diet Follow up - in 2 weeks Activity - NWB LUE, maintain sling Disposition - Home Condition Upon Discharge - Stable DVT Prophylaxis - Aspirin  Dedra Skeens, PA-C Orthopaedic Surgery 04/22/2017, 7:19 AM

## 2017-04-22 NOTE — Progress Notes (Signed)
Physical Therapy Evaluation Patient Details Name: Heidi Hernandez MRN: [id number] DOB: 07/27/1950 Today's Date: 04/22/2017   History of Present Illness  Patient is a 66 y.o. female s/p R reverse total shoulder and biceps tenodesis by Dr. Allena KatzPatel.   Clinical Impression  Patient is a pleasant female admitted for above listed reasons. Patient on evaluation demonstrates modified independence with bed mobility, transfers, and gait. Patient and husband instructed in donning/doffing sling and ice machine, as well as precautions/exercises, including pendulums, AROM elbow/wrist/hand, and scapular retractions. Patient has plan to f/u at OP PT upon d/c for further advancement in operative shoulder protocol.     Follow Up Recommendations Outpatient PT    Equipment Recommendations  Other (comment) (Shower seat)    Recommendations for Other Services       Precautions / Restrictions Precautions Precautions: Fall;Shoulder Type of Shoulder Precautions: No A/AAROM Shoulder Interventions: Shoulder sling/immobilizer;Shoulder abduction pillow;At all times Required Braces or Orthoses: Sling Restrictions Weight Bearing Restrictions: Yes RUE Weight Bearing: Non weight bearing      Mobility  Bed Mobility Overal bed mobility: Modified Independent             General bed mobility comments: Patient performed bed mobility with modified independence with HOB elevated.  Transfers Overall transfer level: Modified independent Equipment used: None             General transfer comment: Patient moves from sit to stand and stand to sit with good safety awareness. No LOB or dizziness.  Ambulation/Gait Ambulation/Gait assistance: Min guard Ambulation Distance (Feet): 180 Feet Assistive device: None       General Gait Details: Patient ambulates at decreased cadence with no LOB. Tendency to shrug R shoulder during gait but could correct with verbal cues.  Stairs            Wheelchair  Mobility    Modified Rankin (Stroke Patients Only)       Balance Overall balance assessment: Modified Independent                                           Pertinent Vitals/Pain Pain Assessment: 0-10 Pain Score: 5  Pain Location: Shoulder Pain Descriptors / Indicators: Aching Pain Intervention(s): Limited activity within patient's tolerance;Monitored during session;Repositioned;Ice applied    Home Living Family/patient expects to be discharged to:: Private residence Living Arrangements: Spouse/significant other Available Help at Discharge: Family;Available 24 hours/day Type of Home: House Home Access: Stairs to enter Entrance Stairs-Rails: Can reach both Entrance Stairs-Number of Steps: 4 Home Layout: Two level;Able to live on main level with bedroom/bathroom Home Equipment: None      Prior Function Level of Independence: Independent               Hand Dominance   Dominant Hand: Right    Extremity/Trunk Assessment   Upper Extremity Assessment Upper Extremity Assessment: RUE deficits/detail;Overall WFL for tasks assessed RUE Deficits / Details: R shoulder ROM not assessed due to precautions; AROM elbow/wrist/hand WNL; sensation and capillary refill intact RUE: Unable to fully assess due to immobilization    Lower Extremity Assessment Lower Extremity Assessment: Overall WFL for tasks assessed    Cervical / Trunk Assessment Cervical / Trunk Assessment: Kyphotic  Communication   Communication: No difficulties  Cognition Arousal/Alertness: Awake/alert Behavior During Therapy: WFL for tasks assessed/performed Overall Cognitive Status: Within Functional Limits for tasks assessed  General Comments      Exercises Other Exercises Other Exercises: Pendulums; AROM elbow flexion/extension x10; AROM wrist flexion/extension x30, radial/ulnar deviation x20; gripping; scapular retractions x10    Assessment/Plan    PT Assessment Patient needs continued PT services  PT Problem List Decreased strength;Decreased range of motion;Decreased activity tolerance;Decreased knowledge of use of DME;Pain;Decreased skin integrity       PT Treatment Interventions DME instruction;Functional mobility training;Therapeutic activities;Therapeutic exercise;Patient/family education    PT Goals (Current goals can be found in the Care Plan section)  Acute Rehab PT Goals Patient Stated Goal: "To go home" PT Goal Formulation: With patient/family Time For Goal Achievement: 05/06/17 Potential to Achieve Goals: Good    Frequency BID   Barriers to discharge        Co-evaluation               AM-PAC PT "6 Clicks" Daily Activity  Outcome Measure Difficulty turning over in bed (including adjusting bedclothes, sheets and blankets)?: None Difficulty moving from lying on back to sitting on the side of the bed? : None Difficulty sitting down on and standing up from a chair with arms (e.g., wheelchair, bedside commode, etc,.)?: None Help needed moving to and from a bed to chair (including a wheelchair)?: None Help needed walking in hospital room?: A Little Help needed climbing 3-5 steps with a railing? : A Little 6 Click Score: 22    End of Session Equipment Utilized During Treatment: Gait belt Activity Tolerance: Patient tolerated treatment well Patient left: in bed;with call bell/phone within reach;with bed alarm set;with family/visitor present   PT Visit Diagnosis: Muscle weakness (generalized) (M62.81);Pain Pain - Right/Left: Right Pain - part of body: Shoulder    Time: 8119-1478 PT Time Calculation (min) (ACUTE ONLY): 34 min   Charges:   PT Evaluation $PT Eval Low Complexity: 1 Low PT Treatments $Therapeutic Exercise: 8-22 mins   PT G Codes:   PT G-Codes **NOT FOR INPATIENT CLASS** Functional Assessment Tool Used: AM-PAC 6 Clicks Basic Mobility;Clinical judgement Functional  Limitation: Mobility: Walking and moving around Mobility: Walking and Moving Around Current Status (G9562): At least 20 percent but less than 40 percent impaired, limited or restricted Mobility: Walking and Moving Around Goal Status [phone]): At least 20 percent but less than 40 percent impaired, limited or restricted    Neita Carp, PT, DPT 04/22/2017, 9:14 AM

## 2017-04-22 NOTE — Discharge Instructions (Signed)
INSTRUCTIONS AFTER Surgery  o Remove items at home which could result in a fall. This includes throw rugs or furniture in walking pathways o ICE to the affected joint every three hours while awake for 30 minutes at a time, for at least the first 3-5 days, and then as needed for pain and swelling.  Continue to use ice for pain and swelling. You may notice swelling that will progress down to the foot and ankle.  This is normal after surgery.  Elevate your leg when you are not up walking on it.   o Continue to use the breathing machine you got in the hospital (incentive spirometer) which will help keep your temperature down.  It is common for your temperature to cycle up and down following surgery, especially at night when you are not up moving around and exerting yourself.  The breathing machine keeps your lungs expanded and your temperature down.   DIET:  As you were doing prior to hospitalization, we recommend a well-balanced diet.  DRESSING / WOUND CARE / SHOWERING  The surgical bandage can remain in place. This is waterproof. You are able to shower with the bandage on. Use your ice pack throughout the day. They sure there is a towel underneath ice pack. Continue the shoulder immobilizer in place.  ACTIVITY  o Increase activity slowly as tolerated, but follow the weight bearing instructions below.   o No driving for 6 weeks or until further direction given by your physician.  You cannot drive while taking narcotics.  o No lifting or carrying greater than 10 lbs. until further directed by your surgeon. o Avoid periods of inactivity such as sitting longer than an hour when not asleep. This helps prevent blood clots.  o You may return to work once you are authorized by your doctor.     WEIGHT BEARING  Weight-bearing as tolerated. Keep your shoulder immobilizer on with all activities.   EXERCISES Gentle wrist and hand exercises. Physical therapy will begin shoulder range of motion  exercises.  CONSTIPATION  Constipation is defined medically as fewer than three stools per week and severe constipation as less than one stool per week.  Even if you have a regular bowel pattern at home, your normal regimen is likely to be disrupted due to multiple reasons following surgery.  Combination of anesthesia, postoperative narcotics, change in appetite and fluid intake all can affect your bowels.   YOU MUST use at least one of the following options; they are listed in order of increasing strength to get the job done.  They are all available over the counter, and you may need to use some, POSSIBLY even all of these options:    Drink plenty of fluids (prune juice may be helpful) and high fiber foods Colace 100 mg by mouth twice a day  Senokot for constipation as directed and as needed Dulcolax (bisacodyl), take with full glass of water  Miralax (polyethylene glycol) once or twice a day as needed.  If you have tried all these things and are unable to have a bowel movement in the first 3-4 days after surgery call either your surgeon or your primary doctor.    If you experience loose stools or diarrhea, hold the medications until you stool forms back up.  If your symptoms do not get better within 1 week or if they get worse, check with your doctor.  If you experience "the worst abdominal pain ever" or develop nausea or vomiting, please  contact the office immediately for further recommendations for treatment.   ITCHING:  If you experience itching with your medications, try taking only a single pain pill, or even half a pain pill at a time.  You can also use Benadryl over the counter for itching or also to help with sleep.   TED HOSE STOCKINGS:  Use stockings on both legs until for at least 2 weeks or as directed by physician office. They may be removed at night for sleeping.  MEDICATIONS:  See your medication summary on the After Visit Summary that nursing will review with you.  You may  have some home medications which will be placed on hold until you complete the course of blood thinner medication.  It is important for you to complete the blood thinner medication as prescribed.  PRECAUTIONS:  If you experience chest pain or shortness of breath - call 911 immediately for transfer to the hospital emergency department.   If you develop a fever greater that 101 F, purulent drainage from wound, increased redness or drainage from wound, foul odor from the wound/dressing, or calf pain - CONTACT YOUR SURGEON.                                                   FOLLOW-UP APPOINTMENTS:  If you do not already have a post-op appointment, please call the office for an appointment to be seen by your surgeon.  Guidelines for how soon to be seen are listed in your After Visit Summary, but are typically between 1-4 weeks after surgery.  OTHER INSTRUCTIONS:     MAKE SURE YOU:   Understand these instructions.   Get help right away if you are not doing well or get worse.    Thank you for letting us be a part of your medical care team.  It is a privilege we respect greatly.  We hope these instructions will help you stay on track for a fast and full recovery!

## 2017-04-24 ENCOUNTER — Encounter: Payer: Self-pay | Admitting: Orthopedic Surgery

## 2017-04-26 ENCOUNTER — Encounter: Payer: Self-pay | Admitting: Family Medicine

## 2017-04-26 ENCOUNTER — Ambulatory Visit (INDEPENDENT_AMBULATORY_CARE_PROVIDER_SITE_OTHER): Payer: Medicare PPO | Admitting: Family Medicine

## 2017-04-26 VITALS — BP 160/74 | HR 70 | Temp 98.8°F | Resp 16 | Wt 148.0 lb

## 2017-04-26 DIAGNOSIS — J40 Bronchitis, not specified as acute or chronic: Secondary | ICD-10-CM

## 2017-04-26 MED ORDER — LEVOFLOXACIN 750 MG PO TABS: 750.0000 mg | ORAL_TABLET | Freq: Every day | ORAL | 0 refills | Status: AC

## 2017-04-26 NOTE — Progress Notes (Addendum)
Patient: Heidi Hernandez Female    DOB: [birth year]   66 y.o.   MRN: [id number] Visit Date: 04/26/2017  Today's Provider: [name], MD   Chief Complaint  Patient presents with  . Cough   Subjective:    Patient has had cough and fever for 5 days. Patient had surgery on her right shoulder 5 days ago. Her throat was sore that day and she was running a low grade fever. Patient stated symptoms progressed to cough, nasal congestion, chills, sob and slight wheezing. Patient has been using her inhaler, Flonase and otc tylenol with only mild relief.    Cough  This is a new problem. The current episode started in the past 7 days (5 days ago). The problem has been rapidly worsening. The problem occurs every few minutes. The cough is non-productive. Associated symptoms include chills, a fever, headaches, nasal congestion, postnasal drip, rhinorrhea, a sore throat, shortness of breath, sweats and wheezing. Pertinent negatives include no chest pain, ear congestion, ear pain, heartburn, hemoptysis, myalgias, rash or weight loss. The symptoms are aggravated by lying down. She has tried steroid inhaler and rest for the symptoms. The treatment provided mild relief. Her past medical history is significant for asthma, bronchitis and pneumonia. There is no history of bronchiectasis, COPD, emphysema or environmental allergies.       Allergies  Allergen Reactions  . Droperidol Other (See Comments)    "Locked jaw" Facial muscles locked  . Morphine Itching  . Paxil [Paroxetine Hcl] Other (See Comments)    syncope     Current Outpatient Prescriptions:  .  acetaminophen (TYLENOL) 500 MG tablet, Take 2 tablets (1,000 mg total) by mouth every 8 (eight) hours., Disp: 90 tablet, Rfl: 2 .  albuterol (VENTOLIN HFA) 108 (90 Base) MCG/ACT inhaler, Inhale 2 puffs into the lungs every 6 (six) hours as needed for wheezing. May substitute generic or equivalent brand name, Disp: 18 g, Rfl: 1 .  alendronate  (FOSAMAX) 70 MG tablet, Take 1 tablet (70 mg total) by mouth once a week., Disp: 12 tablet, Rfl: 4 .  aspirin 325 MG EC tablet, Take 1 tablet (325 mg total) by mouth daily., Disp: 42 tablet, Rfl: 0 .  atenolol (TENORMIN) 50 MG tablet, TAKE 1 TABLET BY MOUTH ONCE DAILY (Patient taking differently: Take 50 mg by mouth daily), Disp: 30 tablet, Rfl: 5 .  azelastine (ASTELIN) 0.1 % nasal spray, Place 2 sprays into both nostrils daily. , Disp: , Rfl:  .  cholestyramine (QUESTRAN) 4 GM/DOSE powder, Take 1 packet (4 g total) by mouth 2 (two) times daily with a meal. (Patient taking differently: Take 4 g by mouth 2 (two) times daily as needed (for stomach). ), Disp: 378 g, Rfl: 12 .  Fexofenadine HCl (ALLEGRA PO), Take 1 capsule by mouth daily., Disp: , Rfl:  .  fluticasone (FLONASE) 50 MCG/ACT nasal spray, Place 2 sprays into both nostrils daily. , Disp: , Rfl:  .  fluticasone (FLOVENT HFA) 44 MCG/ACT inhaler, Inhale 1 puff into the lungs daily., Disp: , Rfl:  .  oxyCODONE (OXY IR/ROXICODONE) 5 MG immediate release tablet, Take 1 tablet (5 mg total) by mouth every 4 (four) hours as needed for moderate pain ((score 4 to 6))., Disp: 40 tablet, Rfl: 0 .  pramipexole (MIRAPEX) 1 MG tablet, TAKE 1 TABLET BY MOUTH TWICE DAILY, Disp: 180 tablet, Rfl: 3 .  RABEprazole (ACIPHEX) 20 MG tablet, Take 20 mg by mouth 2 (two) times daily. ,  Disp: , Rfl:  .  sucralfate (CARAFATE) 1 g tablet, Take 1 g by mouth 2 (two) times daily. , Disp: , Rfl:  .  SUMAtriptan (IMITREX) 100 MG tablet, TAKE 1 AT ONSET OF HEADACHE.  MAY TAKE AN ADDITIONAL TABLET IN 2 HOURS IF NEEEDED BUT ONLY 2 TABLETS IN 24 HOURS (Patient taking differently: TAKE 100 MG AT ONSET OF HEADACHE.  MAY TAKE AN ADDITIONAL TABLET IN 2 HOURS IF NEEEDED BUT ONLY 2 TABLETS IN 24 HOURS), Disp: 9 tablet, Rfl: 5  Review of Systems  Constitutional: Positive for chills and fever. Negative for appetite change, fatigue and weight loss.  HENT: Positive for congestion,  postnasal drip, rhinorrhea, sinus pain, sinus pressure, sore throat, trouble swallowing and voice change. Negative for ear pain.   Respiratory: Positive for cough, shortness of breath and wheezing. Negative for hemoptysis and chest tightness.   Cardiovascular: Negative for chest pain and palpitations.  Gastrointestinal: Negative for abdominal pain, heartburn, nausea and vomiting.  Musculoskeletal: Negative for myalgias.  Skin: Negative for rash.  Allergic/Immunologic: Negative for environmental allergies.  Neurological: Positive for headaches. Negative for dizziness and weakness.    Social History  Substance Use Topics  . Smoking status: Former Smoker    Packs/day: 1.00    Years: 10.00    Types: Cigarettes    Quit date: 11/10/1979  . Smokeless tobacco: Never Used  . Alcohol use 0.0 oz/week     Comment: occasional- wine   Objective:   BP (!) 160/74 (BP Location: Left Arm, Patient Position: Sitting, Cuff Size: Normal)   Pulse 70   Temp 98.8 F (37.1 C) (Oral)   Resp 16   Wt 148 lb (67.1 kg)   SpO2 95%   BMI 25.81 kg/m  Vitals:   04/26/17 0951  BP: (!) 160/74  Pulse: 70  Resp: 16  Temp: 98.8 F (37.1 C)  TempSrc: Oral  SpO2: 95%  Weight: 148 lb (67.1 kg)     Physical Exam  General Appearance:    Alert, cooperative, no distress  HENT:   ENT exam normal, no neck nodes or sinus tenderness  Eyes:    PERRL, conjunctiva/corneas clear, EOM's intact       Lungs:     Occasional expiratory wheezes, respirations unlabored  Heart:    Regular rate and rhythm  Neurologic:   Awake, alert, oriented x 3. No apparent focal neurological           defect.           Assessment & Plan:     1. Bronchitis Considering patients history recurrent and persistent pneumonia and post splenectomy status will treat for potential bacterial bronchitis.  - levofloxacin (LEVAQUIN) 750 MG tablet; Take 1 tablet (750 mg total) by mouth daily.  Dispense: 7 tablet; Refill: 0  Call if symptoms change  or if not rapidly improving.   Patient encouraged to get flu vaccine from here or pharmacy when recovered from acute illness.         Mila Merryonald Fisher, MD  [facility]

## 2017-04-26 NOTE — Patient Instructions (Signed)
   You can take OTC cough medication with chlorpheniramine to help with cough.

## 2017-04-28 ENCOUNTER — Other Ambulatory Visit: Payer: Self-pay | Admitting: Family Medicine

## 2017-04-28 MED ORDER — LORAZEPAM 0.5 MG PO TABS: 0.5000 mg | ORAL_TABLET | Freq: Four times a day (QID) | ORAL | 3 refills | Status: DC | PRN

## 2017-04-28 NOTE — Telephone Encounter (Signed)
Please review. Ok to refill?  

## 2017-04-28 NOTE — Telephone Encounter (Signed)
They should not be taken with six hours of each other.

## 2017-04-28 NOTE — Telephone Encounter (Signed)
Patient and her husband advised. RX for Lorazepam called in and I called it in as it was last time

## 2017-04-28 NOTE — Telephone Encounter (Signed)
Pt contacted office for refill request on the following medications:  LORazepam (ATIVAN) 0.5 MG tablet   Last Rx: August 2017  Pt stated she had surgery recently and is having trouble sleeping. Pt stated that she is also taking oxyCODONE (OXY IR/ROXICODONE) 5 MG immediate release tablet and she wanted to make sure it would be ok to take LORazepam (ATIVAN) 0.5 MG tablet with the pain medication. Pt said of so she would like a refill. Please advise. Thanks TNP

## 2017-05-19 ENCOUNTER — Encounter: Payer: Self-pay | Admitting: Family Medicine

## 2017-05-19 ENCOUNTER — Ambulatory Visit (INDEPENDENT_AMBULATORY_CARE_PROVIDER_SITE_OTHER): Payer: Medicare PPO | Admitting: Family Medicine

## 2017-05-19 VITALS — BP 144/82 | HR 60 | Temp 97.9°F | Resp 16 | Wt 140.0 lb

## 2017-05-19 DIAGNOSIS — J069 Acute upper respiratory infection, unspecified: Secondary | ICD-10-CM

## 2017-05-19 MED ORDER — LEVOFLOXACIN 750 MG PO TABS: 750.0000 mg | ORAL_TABLET | Freq: Every day | ORAL | 0 refills | Status: DC

## 2017-05-19 NOTE — Progress Notes (Signed)
Patient: Heidi Hernandez Female    DOB: 09/04/1950   66 y.o.   MRN: [id number] Visit Date: 05/19/2017  Today's Provider: Mila Merryonald Fisher, MD   Chief Complaint  Patient presents with  . URI   Subjective:    HPI Pt reports that she was recently on Levofloxacin for bronchitis on 04/26/17. Symptoms had mostly resolved, but two to three days ago she started not feeling well again. She has a scratchy throat, cough with light yellow sputum and post nasal drip. No fevers, chills. She has a history of recurrent pneumonia likely due to splenectomy status.     Allergies  Allergen Reactions  . Droperidol Other (See Comments)    "Locked jaw" Facial muscles locked  . Morphine Itching  . Paxil [Paroxetine Hcl] Other (See Comments)    syncope     Current Outpatient Medications:  .  acetaminophen (TYLENOL) 500 MG tablet, Take 2 tablets (1,000 mg total) by mouth every 8 (eight) hours., Disp: 90 tablet, Rfl: 2 .  atenolol (TENORMIN) 50 MG tablet, TAKE 1 TABLET BY MOUTH ONCE DAILY (Patient taking differently: Take 50 mg by mouth daily), Disp: 30 tablet, Rfl: 5 .  azelastine (ASTELIN) 0.1 % nasal spray, Place 2 sprays into both nostrils daily. , Disp: , Rfl:  .  Fexofenadine HCl (ALLEGRA PO), Take 1 capsule by mouth daily., Disp: , Rfl:  .  fluticasone (FLONASE) 50 MCG/ACT nasal spray, Place 2 sprays into both nostrils daily. , Disp: , Rfl:  .  fluticasone (FLOVENT HFA) 44 MCG/ACT inhaler, Inhale 1 puff into the lungs daily., Disp: , Rfl:  .  LORazepam (ATIVAN) 0.5 MG tablet, Take 1 tablet (0.5 mg total) by mouth every 6 (six) hours as needed for anxiety., Disp: 30 tablet, Rfl: 3 .  oxyCODONE (OXY IR/ROXICODONE) 5 MG immediate release tablet, Take 1 tablet (5 mg total) by mouth every 4 (four) hours as needed for moderate pain ((score 4 to 6))., Disp: 40 tablet, Rfl: 0 .  RABEprazole (ACIPHEX) 20 MG tablet, Take 20 mg by mouth 2 (two) times daily. , Disp: , Rfl:  .  sucralfate (CARAFATE) 1 g  tablet, Take 1 g by mouth 2 (two) times daily. , Disp: , Rfl:  .  SUMAtriptan (IMITREX) 100 MG tablet, TAKE 1 AT ONSET OF HEADACHE.  MAY TAKE AN ADDITIONAL TABLET IN 2 HOURS IF NEEEDED BUT ONLY 2 TABLETS IN 24 HOURS (Patient taking differently: TAKE 100 MG AT ONSET OF HEADACHE.  MAY TAKE AN ADDITIONAL TABLET IN 2 HOURS IF NEEEDED BUT ONLY 2 TABLETS IN 24 HOURS), Disp: 9 tablet, Rfl: 5 .  albuterol (VENTOLIN HFA) 108 (90 Base) MCG/ACT inhaler, Inhale 2 puffs into the lungs every 6 (six) hours as needed for wheezing. May substitute generic or equivalent brand name (Patient not taking: Reported on 05/19/2017), Disp: 18 g, Rfl: 1 .  alendronate (FOSAMAX) 70 MG tablet, Take 1 tablet (70 mg total) by mouth once a week. (Patient not taking: Reported on 05/19/2017), Disp: 12 tablet, Rfl: 4 .  aspirin 325 MG EC tablet, Take 1 tablet (325 mg total) by mouth daily. (Patient not taking: Reported on 05/19/2017), Disp: 42 tablet, Rfl: 0 .  cholestyramine (QUESTRAN) 4 GM/DOSE powder, Take 1 packet (4 g total) by mouth 2 (two) times daily with a meal. (Patient not taking: Reported on 05/19/2017), Disp: 378 g, Rfl: 12 .  pramipexole (MIRAPEX) 1 MG tablet, TAKE 1 TABLET BY MOUTH TWICE DAILY, Disp: 180 tablet, Rfl:  3  Review of Systems  Constitutional: Positive for fatigue.  HENT: Positive for congestion, postnasal drip, sinus pressure, sinus pain and sore throat.   Eyes: Negative.   Respiratory: Positive for cough and chest tightness.   Cardiovascular: Negative.   Gastrointestinal: Negative.   Endocrine: Negative.   Genitourinary: Negative.   Musculoskeletal: Positive for arthralgias.  Skin: Negative.   Allergic/Immunologic: Negative.   Neurological: Positive for headaches.  Hematological: Negative.   Psychiatric/Behavioral: Positive for sleep disturbance.    Social History   Tobacco Use  . Smoking status: Former Smoker    Packs/day: 1.00    Years: 10.00    Pack years: 10.00    Types: Cigarettes     Last attempt to quit: 11/10/1979    Years since quitting: 37.5  . Smokeless tobacco: Never Used  Substance Use Topics  . Alcohol use: Yes    Alcohol/week: 0.0 oz    Comment: occasional- wine   Objective:   BP (!) 144/82 (BP Location: Left Arm, Patient Position: Sitting, Cuff Size: Normal)   Pulse 60   Temp 97.9 F (36.6 C) (Oral)   Resp 16   Wt 140 lb (63.5 kg)   SpO2 97%   BMI 24.41 kg/m  Vitals:   05/19/17 0957  BP: (!) 144/82  Pulse: 60  Resp: 16  Temp: 97.9 F (36.6 C)  TempSrc: Oral  SpO2: 97%  Weight: 140 lb (63.5 kg)     Physical Exam  General Appearance:    Alert, cooperative, no distress  HENT:   ENT exam normal, no neck nodes or sinus tenderness  Eyes:    PERRL, conjunctiva/corneas clear, EOM's intact       Lungs:     Occasional faint expiratory wheeze respirations unlabored  Heart:    Regular rate and rhythm  Neurologic:   Awake, alert, oriented x 3. No apparent focal neurological           defect.         Assessment & Plan:     1. Upper respiratory tract infection, unspecified type  - levofloxacin (LEVAQUIN) 750 MG tablet; Take 1 tablet (750 mg total) by mouth daily.  Dispense: 7 tablet; Refill: 0  Call if symptoms change or if not rapidly improving.          Mila Merryonald Fisher, MD  [facility]

## 2017-05-19 NOTE — Patient Instructions (Signed)
   Try OTC Chlor-Trimeton (chlorpheniramine) in the evening to help with cough and post nasal drainage

## 2017-08-23 ENCOUNTER — Encounter: Payer: Self-pay | Admitting: Family Medicine

## 2017-08-23 ENCOUNTER — Ambulatory Visit (INDEPENDENT_AMBULATORY_CARE_PROVIDER_SITE_OTHER): Payer: Medicare PPO | Admitting: Family Medicine

## 2017-08-23 ENCOUNTER — Telehealth: Payer: Self-pay

## 2017-08-23 VITALS — BP 138/80 | HR 72 | Temp 99.1°F | Resp 18 | Wt 141.0 lb

## 2017-08-23 DIAGNOSIS — R059 Cough, unspecified: Secondary | ICD-10-CM

## 2017-08-23 DIAGNOSIS — J019 Acute sinusitis, unspecified: Secondary | ICD-10-CM

## 2017-08-23 DIAGNOSIS — L309 Dermatitis, unspecified: Secondary | ICD-10-CM

## 2017-08-23 DIAGNOSIS — R05 Cough: Secondary | ICD-10-CM | POA: Diagnosis not present

## 2017-08-23 MED ORDER — HYDROCOD POLST-CPM POLST ER 10-8 MG/5ML PO SUER
5.0000 mL | Freq: Two times a day (BID) | ORAL | 0 refills | Status: DC
Start: 2017-08-23 — End: 2017-09-26

## 2017-08-23 MED ORDER — CEFDINIR 300 MG PO CAPS: 600.0000 mg | ORAL_CAPSULE | Freq: Every day | ORAL | 0 refills | Status: AC

## 2017-08-23 MED ORDER — AMOXICILLIN-POT CLAVULANATE 875-125 MG PO TABS: 1.0000 | ORAL_TABLET | Freq: Two times a day (BID) | ORAL | 0 refills | Status: AC

## 2017-08-23 MED ORDER — MUPIROCIN CALCIUM 2 % EX CREA: 1.0000 "application " | TOPICAL_CREAM | Freq: Two times a day (BID) | CUTANEOUS | 0 refills | Status: DC

## 2017-08-23 NOTE — Telephone Encounter (Signed)
Patient advised and verbally voiced understanding.  

## 2017-08-23 NOTE — Progress Notes (Signed)
Patient: Heidi Hernandez Female    DOB: 08/18/1950   67 y.o.   MRN: [id number] Visit Date: 08/23/2017  Today's Provider: Mila Merryonald Tamaj Jurgens, MD   Chief Complaint  Patient presents with  . Cough    x 4 days  . Skin Discoloration   Subjective:    Cough  This is a new problem. Episode onset: 4 days ago. The problem has been gradually worsening. The cough is productive of sputum (green colored). Associated symptoms include chills, a fever (up to 101 this morning), headaches, postnasal drip, a rash (lower left leg and right hip), rhinorrhea, a sore throat (this morning) and shortness of breath. Pertinent negatives include no chest pain, ear congestion, ear pain, hemoptysis, myalgias, nasal congestion or wheezing. Treatments tried: Primary school teacherAllegra and Chlor-Trimeton. The treatment provided mild relief.   Rash:  comes in today reporting that she has a hard crusty itchy rash on her lower left leg and right hip. The rash appeared 1 month ago. Patient would like a referral to Dermatology.     Allergies  Allergen Reactions  . Droperidol Other (See Comments)    "Locked jaw" Facial muscles locked  . Morphine Itching  . Paxil [Paroxetine Hcl] Other (See Comments)    syncope     Current Outpatient Medications:  .  acetaminophen (TYLENOL) 500 MG tablet, Take 2 tablets (1,000 mg total) by mouth every 8 (eight) hours., Disp: 90 tablet, Rfl: 2 .  albuterol (VENTOLIN HFA) 108 (90 Base) MCG/ACT inhaler, Inhale 2 puffs into the lungs every 6 (six) hours as needed for wheezing. May substitute generic or equivalent brand name, Disp: 18 g, Rfl: 1 .  aspirin 325 MG EC tablet, Take 1 tablet (325 mg total) by mouth daily., Disp: 42 tablet, Rfl: 0 .  atenolol (TENORMIN) 50 MG tablet, TAKE 1 TABLET BY MOUTH ONCE DAILY (Patient taking differently: Take 50 mg by mouth daily), Disp: 30 tablet, Rfl: 5 .  azelastine (ASTELIN) 0.1 % nasal spray, Place 2 sprays into both nostrils daily. , Disp: , Rfl:  .  cholestyramine  (QUESTRAN) 4 GM/DOSE powder, Take 1 packet (4 g total) by mouth 2 (two) times daily with a meal., Disp: 378 g, Rfl: 12 .  Fexofenadine HCl (ALLEGRA PO), Take 1 capsule by mouth daily., Disp: , Rfl:  .  fluticasone (FLONASE) 50 MCG/ACT nasal spray, Place 2 sprays into both nostrils daily. , Disp: , Rfl:  .  fluticasone (FLOVENT HFA) 44 MCG/ACT inhaler, Inhale 1 puff into the lungs daily., Disp: , Rfl:  .  LORazepam (ATIVAN) 0.5 MG tablet, Take 1 tablet (0.5 mg total) by mouth every 6 (six) hours as needed for anxiety., Disp: 30 tablet, Rfl: 3 .  pramipexole (MIRAPEX) 1 MG tablet, TAKE 1 TABLET BY MOUTH TWICE DAILY, Disp: 180 tablet, Rfl: 3 .  RABEprazole (ACIPHEX) 20 MG tablet, Take 20 mg by mouth 2 (two) times daily. , Disp: , Rfl:  .  sucralfate (CARAFATE) 1 g tablet, Take 1 g by mouth 2 (two) times daily. , Disp: , Rfl:  .  SUMAtriptan (IMITREX) 100 MG tablet, TAKE 1 AT ONSET OF HEADACHE.  MAY TAKE AN ADDITIONAL TABLET IN 2 HOURS IF NEEEDED BUT ONLY 2 TABLETS IN 24 HOURS (Patient taking differently: TAKE 100 MG AT ONSET OF HEADACHE.  MAY TAKE AN ADDITIONAL TABLET IN 2 HOURS IF NEEEDED BUT ONLY 2 TABLETS IN 24 HOURS), Disp: 9 tablet, Rfl: 5 .  alendronate (FOSAMAX) 70 MG tablet, Take 1  tablet (70 mg total) by mouth once a week. (Patient not taking: Reported on 08/23/2017), Disp: 12 tablet, Rfl: 4   Review of Systems  Constitutional: Positive for chills and fever (up to 101 this morning).  HENT: Positive for postnasal drip, rhinorrhea and sore throat (this morning). Negative for ear pain.   Respiratory: Positive for cough and shortness of breath. Negative for hemoptysis and wheezing.   Cardiovascular: Negative for chest pain.  Musculoskeletal: Negative for myalgias.  Skin: Positive for rash (lower left leg and right hip).  Neurological: Positive for headaches.    Social History   Tobacco Use  . Smoking status: Former Smoker    Packs/day: 1.00    Years: 10.00    Pack years: 10.00    Types:  Cigarettes    Last attempt to quit: 11/10/1979    Years since quitting: 37.8  . Smokeless tobacco: Never Used  Substance Use Topics  . Alcohol use: Yes    Alcohol/week: 0.0 oz    Comment: occasional- wine   Objective:   BP 138/80 (BP Location: Left Arm, Patient Position: Sitting, Cuff Size: Normal)   Pulse 72   Temp 99.1 F (37.3 C) (Oral)   Resp 18   Wt 141 lb (64 kg)   SpO2 96% Comment: room air  BMI 24.59 kg/m  Vitals:   08/23/17 0845  Pulse: 72  Resp: 18  SpO2: 96%  Weight: 141 lb (64 kg)     Physical Exam  General Appearance:    Alert, cooperative, no distress  HENT:   bilateral TM normal without fluid or infection, neck without nodes, throat normal without erythema or exudate,  sinus tender and nasal mucosa pale and congested  Eyes:    PERRL, conjunctiva/corneas clear, EOM's intact       Lungs:     Clear to auscultation bilaterally, respirations unlabored  Heart:    Regular rate and rhythm  Skin:   About 1cm slightly exophytic scaly lesion with about a mm of surrounding erythema as described per HPI.           Assessment & Plan:     1. Acute sinusitis, recurrence not specified, unspecified location  - amoxicillin-clavulanate (AUGMENTIN) 875-125 MG tablet; Take 1 tablet by mouth 2 (two) times daily for 7 days.  Dispense: 14 tablet; Refill: 0  2. Dermatitis  - mupirocin cream (BACTROBAN) 2 %; Apply 1 application topically 2 (two) times daily.  Dispense: 30 g; Refill: 0  3. Cough  - chlorpheniramine-HYDROcodone (TUSSIONEX PENNKINETIC ER) 10-8 MG/5ML SUER; Take 5 mLs by mouth 2 (two) times daily.  Dispense: 50 mL; Refill: 0       [name], MD  [facility]

## 2017-08-23 NOTE — Telephone Encounter (Signed)
Change to cefdinir. [facility]

## 2017-08-23 NOTE — Telephone Encounter (Signed)
Patient was seen this morning and was prescribed Augmentin. Shet took the first  pill 2 hours ago after eating chicken noodle soup and a few crackers. She states about 15 minutes ago she started vomiting and having mild abdominal pain. Patient believes the Augmentin is causing these symptoms. Patient wants to know if the medication needs to be changed or would you call in something to help with nausea and vomiting. She says if you call something in for nausea and vomiting, she would like a rectal suppository because she is concerned that a pill would not stay down due to the vomiting.[facility]

## 2017-08-29 ENCOUNTER — Telehealth: Payer: Self-pay | Admitting: Family Medicine

## 2017-08-29 MED ORDER — LEVOFLOXACIN 750 MG PO TABS: 750.0000 mg | ORAL_TABLET | Freq: Every day | ORAL | 0 refills | Status: DC

## 2017-08-29 NOTE — Telephone Encounter (Signed)
Patient states that she was in to see you on 08/23/2017 and is not feeling any better.  She is coughing and is coughing up yellowish green mucus.  She has had a low grade fever for a week.  She states that it feels like she has pulling in her chest now.  She would like to know if you think she needs another round of antibiotics or if you think she needs to come back in for an appointment.  She uses [facility].

## 2017-08-29 NOTE — Telephone Encounter (Signed)
Can change antibiotic to levofloxacin 750ng once a day for 7 days.

## 2017-08-29 NOTE — Telephone Encounter (Signed)
Patient was advised. Rx was sent to pharmacy.  

## 2017-08-29 NOTE — Telephone Encounter (Signed)
Please advise 

## 2017-09-07 ENCOUNTER — Telehealth: Payer: Self-pay | Admitting: Family Medicine

## 2017-09-07 DIAGNOSIS — L989 Disorder of the skin and subcutaneous tissue, unspecified: Secondary | ICD-10-CM

## 2017-09-07 NOTE — Telephone Encounter (Signed)
Please review. Ok to refer?  

## 2017-09-07 NOTE — Telephone Encounter (Signed)
Patient was being treated w / antibiotic cream X 2 weeks  for a place on her left leg at ankle.    Pt said her leg is not any better and would like to be referred to Dr. Adolphus Birchwoodasher.

## 2017-09-26 ENCOUNTER — Ambulatory Visit (INDEPENDENT_AMBULATORY_CARE_PROVIDER_SITE_OTHER): Payer: Medicare PPO | Admitting: Family Medicine

## 2017-09-26 ENCOUNTER — Encounter: Payer: Self-pay | Admitting: Family Medicine

## 2017-09-26 VITALS — BP 160/82 | HR 67 | Temp 99.1°F | Resp 18 | Wt 140.0 lb

## 2017-09-26 DIAGNOSIS — R05 Cough: Secondary | ICD-10-CM

## 2017-09-26 DIAGNOSIS — J019 Acute sinusitis, unspecified: Secondary | ICD-10-CM | POA: Diagnosis not present

## 2017-09-26 DIAGNOSIS — R059 Cough, unspecified: Secondary | ICD-10-CM

## 2017-09-26 DIAGNOSIS — J029 Acute pharyngitis, unspecified: Secondary | ICD-10-CM

## 2017-09-26 LAB — POCT RAPID STREP A (OFFICE): Rapid Strep A Screen: NEGATIVE

## 2017-09-26 MED ORDER — HYDROCOD POLST-CPM POLST ER 10-8 MG/5ML PO SUER: 5.0000 mL | Freq: Two times a day (BID) | ORAL | 0 refills | Status: DC

## 2017-09-26 MED ORDER — PREDNISONE 10 MG PO TABS: ORAL_TABLET | ORAL | 0 refills | Status: AC

## 2017-09-26 MED ORDER — LEVOFLOXACIN 750 MG PO TABS: 750.0000 mg | ORAL_TABLET | Freq: Every day | ORAL | 0 refills | Status: AC

## 2017-09-26 NOTE — Progress Notes (Signed)
poct      Patient: Heidi PaoCarol M Wiedeman Female    DOB: 04/19/1951   67 y.o.   MRN: [id number] Visit Date: 09/26/2017  Today's Provider: Mila Merryonald Keela Rubert, MD   Chief Complaint  Patient presents with  . Cough    x 1 week   Subjective:    Cough  Episode onset: 1 week ago. The problem has been gradually worsening. The cough is productive of sputum. Associated symptoms include chills, ear congestion, ear pain (left ear), a fever, headaches, nasal congestion, postnasal drip, rhinorrhea, a sore throat (worse on left side) and shortness of breath. Pertinent negatives include no chest pain, hemoptysis, myalgias, sweats or wheezing. Treatments tried: Zyrtec, Singulair, nasal sray, inhalers, The treatment provided no relief.   Was treated with Augmentin for sinusitis on 08/23/2017, which  was change to levofloxacin on 08/29/2017 and symptoms had resolved for a couple of weeks. Is currently taking Singular, Zyrtec, clortrimeton and nasal steroid. Also using Fluticasone inhaler every day. States she feel a lot pressure on left side of forehead.   Allergies  Allergen Reactions  . Augmentin [Amoxicillin-Pot Clavulanate] Nausea And Vomiting  . Droperidol Other (See Comments)    "Locked jaw" Facial muscles locked  . Morphine Itching  . Paxil [Paroxetine Hcl] Other (See Comments)    syncope     Current Outpatient Medications:  .  acetaminophen (TYLENOL) 500 MG tablet, Take 2 tablets (1,000 mg total) by mouth every 8 (eight) hours., Disp: 90 tablet, Rfl: 2 .  albuterol (VENTOLIN HFA) 108 (90 Base) MCG/ACT inhaler, Inhale 2 puffs into the lungs every 6 (six) hours as needed for wheezing. May substitute generic or equivalent brand name, Disp: 18 g, Rfl: 1 .  alendronate (FOSAMAX) 70 MG tablet, Take 1 tablet (70 mg total) by mouth once a week., Disp: 12 tablet, Rfl: 4 .  aspirin 325 MG EC tablet, Take 1 tablet (325 mg total) by mouth daily., Disp: 42 tablet, Rfl: 0 .  atenolol (TENORMIN) 50 MG tablet, TAKE 1  TABLET BY MOUTH ONCE DAILY (Patient taking differently: Take 50 mg by mouth daily), Disp: 30 tablet, Rfl: 5 .  azelastine (ASTELIN) 0.1 % nasal spray, Place 2 sprays into both nostrils daily. , Disp: , Rfl:  .  cholestyramine (QUESTRAN) 4 GM/DOSE powder, Take 1 packet (4 g total) by mouth 2 (two) times daily with a meal., Disp: 378 g, Rfl: 12 .  Fexofenadine HCl (ALLEGRA PO), Take 1 capsule by mouth daily., Disp: , Rfl:  .  fluticasone (FLONASE) 50 MCG/ACT nasal spray, Place 2 sprays into both nostrils daily. , Disp: , Rfl:  .  fluticasone (FLOVENT HFA) 44 MCG/ACT inhaler, Inhale 1 puff into the lungs daily., Disp: , Rfl:  .  LORazepam (ATIVAN) 0.5 MG tablet, Take 1 tablet (0.5 mg total) by mouth every 6 (six) hours as needed for anxiety., Disp: 30 tablet, Rfl: 3 .  montelukast (SINGULAIR) 10 MG tablet, Take by mouth., Disp: , Rfl:  .  mupirocin cream (BACTROBAN) 2 %, Apply 1 application topically 2 (two) times daily., Disp: 30 g, Rfl: 0 .  pramipexole (MIRAPEX) 1 MG tablet, TAKE 1 TABLET BY MOUTH TWICE DAILY, Disp: 180 tablet, Rfl: 3 .  RABEprazole (ACIPHEX) 20 MG tablet, Take 20 mg by mouth 2 (two) times daily. , Disp: , Rfl:  .  sucralfate (CARAFATE) 1 g tablet, Take 1 g by mouth 2 (two) times daily. , Disp: , Rfl:  .  SUMAtriptan (IMITREX) 100 MG tablet, TAKE 1 AT  ONSET OF HEADACHE.  MAY TAKE AN ADDITIONAL TABLET IN 2 HOURS IF NEEEDED BUT ONLY 2 TABLETS IN 24 HOURS (Patient taking differently: TAKE 100 MG AT ONSET OF HEADACHE.  MAY TAKE AN ADDITIONAL TABLET IN 2 HOURS IF NEEEDED BUT ONLY 2 TABLETS IN 24 HOURS), Disp: 9 tablet, Rfl: 5 .  chlorpheniramine-HYDROcodone (TUSSIONEX PENNKINETIC ER) 10-8 MG/5ML SUER, Take 5 mLs by mouth 2 (two) times daily. (Patient not taking: Reported on 09/26/2017), Disp: 50 mL, Rfl: 0  Review of Systems  Constitutional: Positive for chills and fever. Negative for appetite change and fatigue.  HENT: Positive for ear pain (left ear), postnasal drip, rhinorrhea and sore  throat (worse on left side).   Respiratory: Positive for cough (productive with thick yellow sputum) and shortness of breath. Negative for hemoptysis, chest tightness and wheezing.   Cardiovascular: Negative for chest pain and palpitations.  Gastrointestinal: Negative for abdominal pain, nausea and vomiting.  Musculoskeletal: Negative for myalgias.  Neurological: Positive for headaches. Negative for dizziness and weakness.    Social History   Tobacco Use  . Smoking status: Former Smoker    Packs/day: 1.00    Years: 10.00    Pack years: 10.00    Types: Cigarettes    Last attempt to quit: 11/10/1979    Years since quitting: 37.9  . Smokeless tobacco: Never Used  Substance Use Topics  . Alcohol use: Yes    Alcohol/week: 0.0 oz    Comment: occasional- wine   Objective:   BP (!) 160/82 (BP Location: Left Arm, Patient Position: Sitting, Cuff Size: Normal)   Pulse 67   Temp 99.1 F (37.3 C) (Oral)   Resp 18   Wt 140 lb (63.5 kg)   SpO2 97% Comment: room air  BMI 24.41 kg/m  There were no vitals filed for this visit.   Physical Exam  General Appearance:    Alert, cooperative, no distress  HENT:   right TM normal without fluid or infection, left TM , dull, bulging, neck without nodes, pharynx erythematous without exudate, left frontal and maxillary sinuses tender and nasal mucosa pale and congested  Eyes:    PERRL, conjunctiva/corneas clear, EOM's intact       Lungs:     Clear to auscultation bilaterally, respirations unlabored  Heart:    Regular rate and rhythm  Neurologic:   Awake, alert, oriented x 3. No apparent focal neurological           defect.           Assessment & Plan:     1. Acute sinusitis, recurrence not specified, unspecified location Considering asplenia and history of recurrent bacterial respiratory infections will start back on levofloxacin. Counseled regarding for potential MS injuries related to levofloxacin use and to avoid strenuous activities.  -  predniSONE (DELTASONE) 10 MG tablet; 6 tablets for 1 day, then 5 for 1 day, then 4 for 1 day, then 3 for 1 day, then 2 for 1 day then 1 for 1 day.  Dispense: 21 tablet; Refill: 0 - levofloxacin (LEVAQUIN) 750 MG tablet; Take 1 tablet (750 mg total) by mouth daily for 7 days.  Dispense: 7 tablet; Refill: 0  2. Sore throat  - POCT rapid strep A - predniSONE (DELTASONE) 10 MG tablet; 6 tablets for 1 day, then 5 for 1 day, then 4 for 1 day, then 3 for 1 day, then 2 for 1 day then 1 for 1 day.  Dispense: 21 tablet; Refill: 0  3. Cough  -  predniSONE (DELTASONE) 10 MG tablet; 6 tablets for 1 day, then 5 for 1 day, then 4 for 1 day, then 3 for 1 day, then 2 for 1 day then 1 for 1 day.  Dispense: 21 tablet; Refill: 0  - chlorpheniramine-HYDROcodone (TUSSIONEX PENNKINETIC ER) 10-8 MG/5ML SUER; Take 5 mLs by mouth 2 (two) times daily.  Dispense: 115 mL; Refill: 0        [name], MD  [facility]

## 2017-10-13 ENCOUNTER — Other Ambulatory Visit: Payer: Self-pay | Admitting: Orthopedic Surgery

## 2017-10-13 DIAGNOSIS — Z96611 Presence of right artificial shoulder joint: Secondary | ICD-10-CM

## 2017-10-13 DIAGNOSIS — M25512 Pain in left shoulder: Secondary | ICD-10-CM

## 2017-10-23 ENCOUNTER — Ambulatory Visit
Admission: RE | Admit: 2017-10-23 | Discharge: 2017-10-23 | Disposition: A | Discharge status: Home / Self Care | Payer: Medicare PPO | Source: Ambulatory Visit | Attending: Orthopedic Surgery | Admitting: Orthopedic Surgery

## 2017-10-23 DIAGNOSIS — M75102 Unspecified rotator cuff tear or rupture of left shoulder, not specified as traumatic: Secondary | ICD-10-CM | POA: Insufficient documentation

## 2017-10-23 DIAGNOSIS — M25512 Pain in left shoulder: Secondary | ICD-10-CM | POA: Diagnosis not present

## 2017-10-23 DIAGNOSIS — Z96611 Presence of right artificial shoulder joint: Secondary | ICD-10-CM

## 2017-10-23 DIAGNOSIS — Z9889 Other specified postprocedural states: Secondary | ICD-10-CM | POA: Diagnosis not present

## 2017-10-30 ENCOUNTER — Other Ambulatory Visit: Payer: Self-pay | Admitting: Family Medicine

## 2017-11-06 ENCOUNTER — Ambulatory Visit (INDEPENDENT_AMBULATORY_CARE_PROVIDER_SITE_OTHER): Payer: Medicare PPO | Admitting: Family Medicine

## 2017-11-06 ENCOUNTER — Encounter: Payer: Self-pay | Admitting: Family Medicine

## 2017-11-06 VITALS — BP 170/88 | HR 66 | Temp 98.9°F | Resp 18 | Wt 139.0 lb

## 2017-11-06 DIAGNOSIS — R21 Rash and other nonspecific skin eruption: Secondary | ICD-10-CM | POA: Diagnosis not present

## 2017-11-06 DIAGNOSIS — J329 Chronic sinusitis, unspecified: Secondary | ICD-10-CM

## 2017-11-06 DIAGNOSIS — I10 Essential (primary) hypertension: Secondary | ICD-10-CM | POA: Diagnosis not present

## 2017-11-06 DIAGNOSIS — R059 Cough, unspecified: Secondary | ICD-10-CM

## 2017-11-06 DIAGNOSIS — R05 Cough: Secondary | ICD-10-CM

## 2017-11-06 MED ORDER — TRIAMCINOLONE ACETONIDE 0.5 % EX OINT: 1.0000 "application " | TOPICAL_OINTMENT | Freq: Two times a day (BID) | CUTANEOUS | 0 refills | Status: DC

## 2017-11-06 MED ORDER — AMLODIPINE BESYLATE 2.5 MG PO TABS: 2.5000 mg | ORAL_TABLET | Freq: Every day | ORAL | 3 refills | Status: DC

## 2017-11-06 MED ORDER — HYDROCOD POLST-CPM POLST ER 10-8 MG/5ML PO SUER: 5.0000 mL | Freq: Two times a day (BID) | ORAL | 0 refills | Status: DC

## 2017-11-06 MED ORDER — LEVOFLOXACIN 750 MG PO TABS: 750.0000 mg | ORAL_TABLET | Freq: Every day | ORAL | 0 refills | Status: AC

## 2017-11-06 NOTE — Progress Notes (Addendum)
Patient: Heidi Hernandez Female    DOB: Nov 16, 1950   67 y.o.   MRN: [id number] Visit Date: 11/06/2017  Today's Provider: [name], MD   Chief Complaint  Patient presents with  . URI   Subjective:    URI   This is a new problem. Episode onset: 3 days ago. The problem has been gradually worsening. Associated symptoms include congestion (sinus congestion), coughing (productive with yellow sputum), headaches, rhinorrhea, sinus pain and sneezing. Pertinent negatives include no abdominal pain, chest pain, ear pain, nausea, sore throat, vomiting or wheezing. She has tried antihistamine for the symptoms. The treatment provided no relief.  Using allergy nasal sprays which aren't helping.   Arm Pain: Patient complains of pain, redness, itchiness and swelling of the left lower arm. She states symptoms started yesterday. The area is warm to touch.      Allergies  Allergen Reactions  . Augmentin [Amoxicillin-Pot Clavulanate] Nausea And Vomiting  . Droperidol Other (See Comments)    "Locked jaw" Facial muscles locked  . Morphine Itching  . Paxil [Paroxetine Hcl] Other (See Comments)    syncope     Current Outpatient Medications:  .  acetaminophen (TYLENOL) 500 MG tablet, Take 2 tablets (1,000 mg total) by mouth every 8 (eight) hours., Disp: 90 tablet, Rfl: 2 .  albuterol (VENTOLIN HFA) 108 (90 Base) MCG/ACT inhaler, Inhale 2 puffs into the lungs every 6 (six) hours as needed for wheezing. May substitute generic or equivalent brand name, Disp: 18 g, Rfl: 1 .  alendronate (FOSAMAX) 70 MG tablet, Take 1 tablet (70 mg total) by mouth once a week., Disp: 12 tablet, Rfl: 4 .  aspirin 325 MG EC tablet, Take 1 tablet (325 mg total) by mouth daily., Disp: 42 tablet, Rfl: 0 .  atenolol (TENORMIN) 50 MG tablet, TAKE 1 TABLET BY MOUTH ONCE DAILY (Patient taking differently: Take 50 mg by mouth daily), Disp: 30 tablet, Rfl: 5 .  azelastine (ASTELIN) 0.1 % nasal spray, Place 2 sprays into  both nostrils daily. , Disp: , Rfl:  .  chlorpheniramine-HYDROcodone (TUSSIONEX PENNKINETIC ER) 10-8 MG/5ML SUER, Take 5 mLs by mouth 2 (two) times daily., Disp: 115 mL, Rfl: 0 .  cholestyramine (QUESTRAN) 4 GM/DOSE powder, Take 1 packet (4 g total) by mouth 2 (two) times daily with a meal., Disp: 378 g, Rfl: 12 .  Fexofenadine HCl (ALLEGRA PO), Take 1 capsule by mouth daily., Disp: , Rfl:  .  fluticasone (FLONASE) 50 MCG/ACT nasal spray, Place 2 sprays into both nostrils daily. , Disp: , Rfl:  .  fluticasone (FLOVENT HFA) 44 MCG/ACT inhaler, Inhale 1 puff into the lungs daily., Disp: , Rfl:  .  LORazepam (ATIVAN) 1 MG tablet, Take 0.5 tablets (0.5 mg total) by mouth every 6 (six) hours as needed for anxiety., Disp: 15 tablet, Rfl: 4 .  montelukast (SINGULAIR) 10 MG tablet, Take by mouth., Disp: , Rfl:  .  mupirocin cream (BACTROBAN) 2 %, Apply 1 application topically 2 (two) times daily., Disp: 30 g, Rfl: 0 .  pramipexole (MIRAPEX) 1 MG tablet, TAKE 1 TABLET BY MOUTH TWICE DAILY, Disp: 180 tablet, Rfl: 3 .  RABEprazole (ACIPHEX) 20 MG tablet, Take 20 mg by mouth 2 (two) times daily. , Disp: , Rfl:  .  sucralfate (CARAFATE) 1 g tablet, Take 1 g by mouth 2 (two) times daily. , Disp: , Rfl:  .  SUMAtriptan (IMITREX) 100 MG tablet, TAKE 1 AT ONSET OF HEADACHE.  MAY  TAKE AN ADDITIONAL TABLET IN 2 HOURS IF NEEEDED BUT ONLY 2 TABLETS IN 24 HOURS (Patient taking differently: TAKE 100 MG AT ONSET OF HEADACHE.  MAY TAKE AN ADDITIONAL TABLET IN 2 HOURS IF NEEEDED BUT ONLY 2 TABLETS IN 24 HOURS), Disp: 9 tablet, Rfl: 5  Review of Systems  Constitutional: Positive for fatigue and fever (low grade last night up to 99.0). Negative for appetite change, chills and diaphoresis.  HENT: Positive for congestion (sinus congestion), postnasal drip, rhinorrhea, sinus pressure, sinus pain and sneezing. Negative for ear pain, nosebleeds and sore throat.        Pressure in both ears, scratchy throat  Eyes: Positive for  discharge (watery eyes) and itching.  Respiratory: Positive for cough (productive with yellow sputum). Negative for chest tightness, shortness of breath and wheezing.   Cardiovascular: Negative for chest pain and palpitations.  Gastrointestinal: Negative for abdominal pain, nausea and vomiting.  Musculoskeletal:       Pain, itching and redness on the lower left arm x 1 day  Neurological: Positive for headaches. Negative for dizziness and weakness.    Social History   Tobacco Use  . Smoking status: Former Smoker    Packs/day: 1.00    Years: 10.00    Pack years: 10.00    Types: Cigarettes    Last attempt to quit: 11/10/1979    Years since quitting: 38.0  . Smokeless tobacco: Never Used  Substance Use Topics  . Alcohol use: Yes    Alcohol/week: 0.0 oz    Comment: occasional- wine   Objective:   BP (!) 170/88 (BP Location: Left Arm, Patient Position: Sitting, Cuff Size: Normal)   Pulse 66   Temp 98.9 F (37.2 C) (Oral)   Resp 18   Wt 139 lb (63 kg)   SpO2 95% Comment: room air  BMI 24.24 kg/m  There were no vitals filed for this visit.   Physical Exam   General Appearance:    Alert, cooperative, no distress  Eyes:    PERRL, conjunctiva/corneas clear, EOM's intact       HEENT:     Nasal mucous congested, yellow discharge noted. Mild tenderness of frontal sinuses. Bilateral cervical lymphadenopathy  Lungs:     Rare expiratory wheeze, no rales, respirations unlabored  Heart:    Regular rate and rhythm  Neurologic:   Awake, alert, oriented x 3. No apparent focal neurological           defect.       About 3cm wide strip of erythema along left medial arm, warm to touch.     Assessment & Plan:     1. Cough  - chlorpheniramine-HYDROcodone (TUSSIONEX PENNKINETIC ER) 10-8 MG/5ML SUER; Take 5 mLs by mouth 2 (two) times daily.  Dispense: 115 mL; Refill: 0  2. Sinusitis, unspecified chronicity, unspecified location  - levofloxacin (LEVAQUIN) 750 MG tablet; Take 1 tablet (750  mg total) by mouth daily for 7 days.  Dispense: 7 tablet; Refill: 0 Last episode required prednisone, but she is in much earlier this time. Advised she could call back for prednisone prescription if not much better in a few days.   3. Rash Likely secondary to insect or spider bite. Main symptom is itchiness. Is covered for infection with levofloxacin above.  - triamcinolone ointment (KENALOG) 0.5 %; Apply 1 application topically 2 (two) times daily.  Dispense: 30 g; Refill: 0  4. Essential hypertension start - amLODipine (NORVASC) 2.5 MG tablet; Take 1 tablet (2.5 mg total) by  mouth daily.  Dispense: 90 tablet; Refill: 3  Advised she is due for pneumovax-23, and second dose of Men B and men conjugate vaccine. Declined today do to illness.       [name], MD  [facility]

## 2017-11-06 NOTE — Patient Instructions (Addendum)
   You are due for the second dose of Meningococcal B vaccine and Meningococcal Conjugate ACWY vaccine due to aspenia (ICD code Z90.81). Please check with your insurance to ensure it is covered before your next appointment   You are also due for Pneumvax-23 vaccine. This is a universally covered Medicare benefit.

## 2017-11-21 ENCOUNTER — Encounter: Payer: Self-pay | Admitting: *Deleted

## 2017-11-22 ENCOUNTER — Encounter: Payer: Self-pay | Admitting: *Deleted

## 2017-11-22 ENCOUNTER — Encounter
Admission: RE | Disposition: A | Discharge status: Home / Self Care | Payer: Self-pay | Source: Ambulatory Visit | Attending: Unknown Physician Specialty

## 2017-11-22 ENCOUNTER — Ambulatory Visit
Admission: RE | Admit: 2017-11-22 | Discharge: 2017-11-22 | Disposition: A | Discharge status: Home / Self Care | Payer: Medicare PPO | Source: Ambulatory Visit | Attending: Unknown Physician Specialty | Admitting: Unknown Physician Specialty

## 2017-11-22 ENCOUNTER — Ambulatory Visit: Payer: Medicare PPO | Admitting: Anesthesiology

## 2017-11-22 DIAGNOSIS — G2581 Restless legs syndrome: Secondary | ICD-10-CM | POA: Diagnosis not present

## 2017-11-22 DIAGNOSIS — I10 Essential (primary) hypertension: Secondary | ICD-10-CM | POA: Insufficient documentation

## 2017-11-22 DIAGNOSIS — Z87891 Personal history of nicotine dependence: Secondary | ICD-10-CM | POA: Insufficient documentation

## 2017-11-22 DIAGNOSIS — K210 Gastro-esophageal reflux disease with esophagitis: Secondary | ICD-10-CM | POA: Diagnosis not present

## 2017-11-22 DIAGNOSIS — I341 Nonrheumatic mitral (valve) prolapse: Secondary | ICD-10-CM | POA: Insufficient documentation

## 2017-11-22 DIAGNOSIS — R1310 Dysphagia, unspecified: Secondary | ICD-10-CM | POA: Diagnosis present

## 2017-11-22 DIAGNOSIS — F419 Anxiety disorder, unspecified: Secondary | ICD-10-CM | POA: Insufficient documentation

## 2017-11-22 DIAGNOSIS — J45909 Unspecified asthma, uncomplicated: Secondary | ICD-10-CM | POA: Insufficient documentation

## 2017-11-22 DIAGNOSIS — K2950 Unspecified chronic gastritis without bleeding: Secondary | ICD-10-CM | POA: Insufficient documentation

## 2017-11-22 DIAGNOSIS — Z79899 Other long term (current) drug therapy: Secondary | ICD-10-CM | POA: Diagnosis not present

## 2017-11-22 DIAGNOSIS — K2270 Barrett's esophagus without dysplasia: Secondary | ICD-10-CM | POA: Insufficient documentation

## 2017-11-22 HISTORY — PX: ESOPHAGOGASTRODUODENOSCOPY (EGD) WITH PROPOFOL: SHX5813

## 2017-11-22 SURGERY — ESOPHAGOGASTRODUODENOSCOPY (EGD) WITH PROPOFOL
Anesthesia: General

## 2017-11-22 MED ORDER — SODIUM CHLORIDE 0.9 % IV SOLN: INTRAVENOUS | Status: DC

## 2017-11-22 MED ORDER — PIPERACILLIN-TAZOBACTAM 3.375 G IVPB 30 MIN
3.3750 g | Freq: Once | INTRAVENOUS | Status: DC
  Filled 2017-11-22: qty 50

## 2017-11-22 MED ORDER — SODIUM CHLORIDE 0.9 % IV SOLN
INTRAVENOUS | Status: DC
  Administered 2017-11-22: 11:00:00 via INTRAVENOUS
  Administered 2017-11-22: 1000 mL via INTRAVENOUS

## 2017-11-22 MED ORDER — PIPERACILLIN-TAZOBACTAM 3.375 G IVPB
INTRAVENOUS | Status: AC
  Filled 2017-11-22: qty 50

## 2017-11-22 MED ORDER — PROPOFOL 10 MG/ML IV BOLUS
INTRAVENOUS | Status: DC | PRN
  Administered 2017-11-22: 50 mg via INTRAVENOUS
  Administered 2017-11-22: 20 mg via INTRAVENOUS

## 2017-11-22 MED ORDER — PROPOFOL 500 MG/50ML IV EMUL
INTRAVENOUS | Status: DC | PRN
  Administered 2017-11-22: 50 ug/kg/min via INTRAVENOUS

## 2017-11-22 NOTE — Transfer of Care (Signed)
Immediate Anesthesia Transfer of Care Note  Patient: Heidi Hernandez  Procedure(s) Performed: ESOPHAGOGASTRODUODENOSCOPY (EGD) WITH PROPOFOL (N/A )  Patient Location: PACU  Anesthesia Type:General  Level of Consciousness: awake  Airway & Oxygen Therapy: Patient Spontanous Breathing  Post-op Assessment: Report given to RN  Post vital signs: Reviewed and stable  Last Vitals:  Vitals Value Taken Time  BP 115/68 11/22/2017 11:18 AM  Temp 36.5 C 11/22/2017 11:18 AM  Pulse 68 11/22/2017 11:18 AM  Resp 16 11/22/2017 11:18 AM  SpO2 99 % 11/22/2017 11:18 AM    Last Pain:  Vitals:   11/22/17 1110  TempSrc: Tympanic  PainSc:          Complications: No apparent anesthesia complications

## 2017-11-22 NOTE — Anesthesia Post-op Follow-up Note (Signed)
Anesthesia QCDR form completed.        

## 2017-11-22 NOTE — Anesthesia Preprocedure Evaluation (Signed)
Anesthesia Evaluation  Patient identified by MRN, date of birth, ID band Patient awake    Reviewed: Allergy & Precautions, H&P , NPO status , Patient's Chart, lab work & pertinent test results, reviewed documented beta blocker date and time   Airway Mallampati: II   Neck ROM: full    Dental  (+) Poor Dentition   Pulmonary neg pulmonary ROS, asthma , pneumonia, resolved, former smoker,    Pulmonary exam normal        Cardiovascular hypertension, On Medications negative cardio ROS Normal cardiovascular exam+ Valvular Problems/Murmurs  Rhythm:regular Rate:Normal     Neuro/Psych  Headaches, PSYCHIATRIC DISORDERS Anxiety Depression  Neuromuscular disease negative neurological ROS  negative psych ROS   GI/Hepatic negative GI ROS, Neg liver ROS, GERD  ,  Endo/Other  negative endocrine ROS  Renal/GU negative Renal ROS  negative genitourinary   Musculoskeletal   Abdominal   Peds  Hematology negative hematology ROS (+)   Anesthesia Other Findings Past Medical History: No date: Anxiety No date: Asthma No date: GERD (gastroesophageal reflux disease) No date: Headache     Comment:  Migraines No date: Hypertension No date: Idiopathic thrombocytopenia purpura (HCC)     Comment:  Required splenectomy No date: MVP (mitral valve prolapse) No date: Neuromuscular disorder [facility])     Comment:  Restless Leg Syndrome April 2017 [facility]: Pancreatitis     Comment:  [facility] due to cholecystitis.  No date: Pneumonia     Comment:  06-2015 Past Surgical History: No date: ABDOMINAL HYSTERECTOMY No date: APPENDECTOMY 11/19/2014: CARPAL TUNNEL RELEASE; Bilateral     Comment:  Procedure: CARPAL TUNNEL RELEASE;  Surgeon: Donato Heinz, MD;  Location: [facility] ORS;  Service: Orthopedics;                Laterality: Bilateral; No date: CESAREAN SECTION     Comment:  times 2 09/29/2015: CHOLECYSTECTOMY     Comment:  [facility] 05/02/2016:  COLONOSCOPY; N/A     Comment:  Procedure: COLONOSCOPY;  Surgeon: Scot Jun, MD;               Location: [facility] ENDOSCOPY;  Service: Endoscopy;                Laterality: N/A; 05/02/2016: ESOPHAGOGASTRODUODENOSCOPY (EGD) WITH PROPOFOL; N/A     Comment:  Procedure: ESOPHAGOGASTRODUODENOSCOPY (EGD) WITH               PROPOFOL;  Surgeon: Scot Jun, MD;  Location: [facility]              ENDOSCOPY;  Service: Endoscopy;  Laterality: N/A; No date: HERNIA REPAIR     Comment:  times 3 No date: OVARIAN CYST SURGERY No date: SHOULDER ARTHROSCOPY WITH ROTATOR CUFF REPAIR     Comment:  times 2 No date: SPLENECTOMY, TOTAL 04/21/2017: TOTAL SHOULDER ARTHROPLASTY; Right     Comment:  Procedure: REVERSE SHOULDER ARTHROPLASTY;  Surgeon:               Signa Kell, MD;  Location: [facility] ORS;  Service:               Orthopedics;  Laterality: Right; 11/19/2014: TRIGGER FINGER RELEASE; Right     Comment:  Procedure: RELEASE TRIGGER FINGER/A-1 PULLEY;  Surgeon:               Donato Heinz, MD;  Location: [facility] ORS;  Service:  Orthopedics;  Laterality: Right; 08/12/2015: TRIGGER FINGER RELEASE; Right     Comment:  Procedure: RELEASE TRIGGER LONG FINGER;  Surgeon: Donato Heinz, MD;  Location: [facility] ORS;  Service: Orthopedics;              Laterality: Right;   Reproductive/Obstetrics negative OB ROS                             Anesthesia Physical Anesthesia Plan  ASA: III  Anesthesia Plan: General   Post-op Pain Management:    Induction:   PONV Risk Score and Plan:   Airway Management Planned:   Additional Equipment:   Intra-op Plan:   Post-operative Plan:   Informed Consent: I have reviewed the patients History and Physical, chart, labs and discussed the procedure including the risks, benefits and alternatives for the proposed anesthesia with the patient or authorized representative who has indicated his/her understanding and acceptance.    Dental Advisory Given  Plan Discussed with: CRNA  Anesthesia Plan Comments:         Anesthesia Quick Evaluation

## 2017-11-22 NOTE — Op Note (Signed)
[facility] Patient Name: Heidi Hernandez Procedure Date: 11/22/2017 10:29 AM MRN: [id number] Account #: [id number] Date of Birth: 1950-10-19 Admit Type: Outpatient Age: 67 Room: [facility] ENDO ROOM 3 Gender: Female Note Status: Finalized Procedure:            Upper GI endoscopy Indications:          Dysphagia, Heartburn, Follow-up of Barrett's esophagus Providers:            Scot Jun, MD Referring MD:         Demetrios Isaacs. Sherrie Mustache, MD (Referring MD) Medicines:            Propofol per Anesthesia Complications:        No immediate complications. Procedure:            Pre-Anesthesia Assessment:                       - After reviewing the risks and benefits, the patient                        was deemed in satisfactory condition to undergo the                        procedure.                       After obtaining informed consent, the endoscope was                        passed under direct vision. Throughout the procedure,                        the patient's blood pressure, pulse, and oxygen                        saturations were monitored continuously. The                        Colonoscope was introduced through the mouth, and                        advanced to the second part of duodenum. The upper GI                        endoscopy was accomplished without difficulty. The                        patient tolerated the procedure well. Findings:      LA Grade A (one or more mucosal breaks less than 5 mm, not extending       between tops of 2 mucosal folds) esophagitis with no bleeding was found       40 cm from the teeth. Biopsies were taken with a cold forceps for       histology.      Patchy mildly erythematous mucosa without bleeding was found in the       gastric body and in the gastric antrum. Biopsies were taken with a cold       forceps for histology.      The examined duodenum was normal.      At the end of the procedure a guide wire was  passed and a  19F Savary       dilator was passed with mild resistance. Impression:           - LA Grade A reflux esophagitis. Rule out Barrett's                        esophagus. Biopsied.                       - Erythematous mucosa in the gastric body and antrum.                        Biopsied.                       - Normal examined duodenum. Recommendation:       - Await pathology results. Scot Jun, MD 11/22/2017 11:10:33 AM This report has been signed electronically. Number of Addenda: 0 Note Initiated On: 11/22/2017 10:29 AM      [facility]

## 2017-11-22 NOTE — H&P (Signed)
Primary Care Physician:  Malva Limes, MD Primary Gastroenterologist:  Dr. Mechele Collin  Pre-Procedure History & Physical: HPI:  Heidi Hernandez is a 67 y.o. female is here for an endoscopy.  For dysphagia and Barretts esophagus.   Past Medical History:  Diagnosis Date  . Anxiety   . Asthma   . GERD (gastroesophageal reflux disease)   . Headache    Migraines  . Hypertension   . Idiopathic thrombocytopenia purpura (HCC)    Required splenectomy  . MVP (mitral valve prolapse)   . Neuromuscular disorder (HCC)    Restless Leg Syndrome  . Pancreatitis April 2017 [facility] due to cholecystitis.   . Pneumonia    06-2015    Past Surgical History:  Procedure Laterality Date  . ABDOMINAL HYSTERECTOMY    . APPENDECTOMY    . CARPAL TUNNEL RELEASE Bilateral 11/19/2014   Procedure: CARPAL TUNNEL RELEASE;  Surgeon: Donato Heinz, MD;  Location: [facility] ORS;  Service: Orthopedics;  Laterality: Bilateral;  . CESAREAN SECTION     times 2  . CHOLECYSTECTOMY  09/29/2015   [facility]  . COLONOSCOPY N/A 05/02/2016   Procedure: COLONOSCOPY;  Surgeon: Scot Jun, MD;  Location: [facility] ENDOSCOPY;  Service: Endoscopy;  Laterality: N/A;  . ESOPHAGOGASTRODUODENOSCOPY (EGD) WITH PROPOFOL N/A 05/02/2016   Procedure: ESOPHAGOGASTRODUODENOSCOPY (EGD) WITH PROPOFOL;  Surgeon: Scot Jun, MD;  Location: [facility] ENDOSCOPY;  Service: Endoscopy;  Laterality: N/A;  . HERNIA REPAIR     times 3  . JOINT REPLACEMENT    . OVARIAN CYST SURGERY    . SHOULDER ARTHROSCOPY WITH ROTATOR CUFF REPAIR     times 2  . SPLENECTOMY, TOTAL    . TOTAL SHOULDER ARTHROPLASTY Right 04/21/2017   Procedure: REVERSE SHOULDER ARTHROPLASTY;  Surgeon: Signa Kell, MD;  Location: [facility] ORS;  Service: Orthopedics;  Laterality: Right;  . TRIGGER FINGER RELEASE Right 11/19/2014   Procedure: RELEASE TRIGGER FINGER/A-1 PULLEY;  Surgeon: Donato Heinz, MD;  Location: [facility] ORS;  Service: Orthopedics;  Laterality: Right;  . TRIGGER FINGER  RELEASE Right 08/12/2015   Procedure: RELEASE TRIGGER LONG FINGER;  Surgeon: Donato Heinz, MD;  Location: [facility] ORS;  Service: Orthopedics;  Laterality: Right;    Prior to Admission medications   Medication Sig Start Date End Date Taking? Authorizing Provider  acetaminophen (TYLENOL) 500 MG tablet Take 2 tablets (1,000 mg total) by mouth every 8 (eight) hours. 04/22/17 04/22/18 Yes Signa Kell, MD  albuterol (VENTOLIN HFA) 108 (90 Base) MCG/ACT inhaler Inhale 2 puffs into the lungs every 6 (six) hours as needed for wheezing. May substitute generic or equivalent brand name 07/07/15  Yes Malva Limes, MD  atenolol (TENORMIN) 50 MG tablet TAKE 1 TABLET BY MOUTH ONCE DAILY Patient taking differently: Take 50 mg by mouth daily 01/04/17  Yes Fisher, Demetrios Isaacs, MD  azelastine (ASTELIN) 0.1 % nasal spray Place 2 sprays into both nostrils daily.  12/24/13  Yes [provider]  chlorpheniramine-HYDROcodone (TUSSIONEX PENNKINETIC ER) 10-8 MG/5ML SUER Take 5 mLs by mouth 2 (two) times daily. 11/06/17  Yes Malva Limes, MD  Fexofenadine HCl (ALLEGRA PO) Take 1 capsule by mouth daily.   Yes [provider]  fluticasone (FLONASE) 50 MCG/ACT nasal spray Place 2 sprays into both nostrils daily.  12/05/14  Yes [provider]  fluticasone (FLOVENT HFA) 44 MCG/ACT inhaler Inhale 1 puff into the lungs daily.   Yes [provider]  LORazepam (ATIVAN) 1 MG tablet Take 0.5 tablets (0.5  mg total) by mouth every 6 (six) hours as needed for anxiety. 10/31/17  Yes Malva Limes, MD  montelukast (SINGULAIR) 10 MG tablet Take by mouth. 12/24/13  Yes [provider]  mupirocin cream (BACTROBAN) 2 % Apply 1 application topically 2 (two) times daily. 08/23/17  Yes Malva Limes, MD  pramipexole (MIRAPEX) 1 MG tablet TAKE 1 TABLET BY MOUTH TWICE DAILY 04/13/17  Yes Malva Limes, MD  RABEprazole (ACIPHEX) 20 MG tablet Take 20 mg by mouth 2 (two) times daily.  06/01/16  Yes  [provider]  SUMAtriptan (IMITREX) 100 MG tablet TAKE 1 AT ONSET OF HEADACHE.  MAY TAKE AN ADDITIONAL TABLET IN 2 HOURS IF NEEEDED BUT ONLY 2 TABLETS IN 24 HOURS Patient taking differently: TAKE 100 MG AT ONSET OF HEADACHE.  MAY TAKE AN ADDITIONAL TABLET IN 2 HOURS IF NEEEDED BUT ONLY 2 TABLETS IN 24 HOURS 10/11/16  Yes Malva Limes, MD  triamcinolone ointment (KENALOG) 0.5 % Apply 1 application topically 2 (two) times daily. 11/06/17  Yes Malva Limes, MD  alendronate (FOSAMAX) 70 MG tablet Take 1 tablet (70 mg total) by mouth once a week. Patient not taking: Reported on 11/22/2017 02/12/16   Malva Limes, MD  amLODipine (NORVASC) 2.5 MG tablet Take 1 tablet (2.5 mg total) by mouth daily. Patient not taking: Reported on 11/22/2017 11/06/17   Malva Limes, MD  aspirin 325 MG EC tablet Take 1 tablet (325 mg total) by mouth daily. Patient not taking: Reported on 11/22/2017 04/22/17   Signa Kell, MD  cholestyramine Lanetta Inch) 4 GM/DOSE powder Take 1 packet (4 g total) by mouth 2 (two) times daily with a meal. Patient not taking: Reported on 11/22/2017 01/14/16   Malva Limes, MD  sucralfate (CARAFATE) 1 g tablet Take 1 g by mouth 2 (two) times daily.  06/01/16   [provider]    Allergies as of 10/18/2017 - Review Complete 09/26/2017  Allergen Reaction Noted  . Augmentin [amoxicillin-pot clavulanate] Nausea And Vomiting 08/23/2017  . Droperidol Other (See Comments) 11/10/2014  . Morphine Itching 12/23/2014  . Paxil [paroxetine hcl] Other (See Comments) 12/23/2014    Family History  Problem Relation Age of Onset  . Hypertension Mother   . Renal Disease Father        ESRD with HD    Social History   Socioeconomic History  . Marital status: Married    Spouse name: Not on file  . Number of children: 2  . Years of education: Not on file  . Highest education level: Not on file  Occupational History  . Occupation: Retired  Engineer, production  . Financial  resource strain: Not on file  . Food insecurity:    Worry: Not on file    Inability: Not on file  . Transportation needs:    Medical: Not on file    Non-medical: Not on file  Tobacco Use  . Smoking status: Former Smoker    Packs/day: 1.00    Years: 10.00    Pack years: 10.00    Types: Cigarettes    Last attempt to quit: 11/10/1979    Years since quitting: 38.0  . Smokeless tobacco: Never Used  Substance and Sexual Activity  . Alcohol use: Yes    Alcohol/week: 0.0 oz    Comment: occasional- wine  . Drug use: No  . Sexual activity: Never  Lifestyle  . Physical activity:    Days per week: Not on file  Minutes per session: Not on file  . Stress: Not on file  Relationships  . Social connections:    Talks on phone: Not on file    Gets together: Not on file    Attends religious service: Not on file    Active member of club or organization: Not on file    Attends meetings of clubs or organizations: Not on file    Relationship status: Not on file  . Intimate partner violence:    Fear of current or ex partner: Not on file    Emotionally abused: Not on file    Physically abused: Not on file    Forced sexual activity: Not on file  Other Topics Concern  . Not on file  Social History Narrative  . Not on file    Review of Systems: See HPI, otherwise negative ROS  Physical Exam: BP 130/69   Pulse (!) 53   Temp (!) 97.3 F (36.3 C) (Tympanic)   Resp 18   Ht 5' 3.5" (1.613 m)   Wt 61.7 kg (136 lb)   SpO2 98%   BMI 23.71 kg/m  General:   Alert,  pleasant and cooperative in NAD Head:  Normocephalic and atraumatic. Neck:  Supple; no masses or thyromegaly. Lungs:  Clear throughout to auscultation.    Heart:  Regular rate and rhythm. Abdomen:  Soft, nontender and nondistended. Normal bowel sounds, without guarding, and without rebound.   Neurologic:  Alert and  oriented x4;  grossly normal neurologically.  Impression/Plan: Heidi Hernandez is here for an endoscopy to be  performed for Barretts esophagus and dysphagia.  Risks, benefits, limitations, and alternatives regarding  endoscopy have been reviewed with the patient.  Questions have been answered.  All parties agreeable.   Lynnae Prude, MD  11/22/2017, 10:42 AM

## 2017-11-23 ENCOUNTER — Telehealth: Payer: Self-pay | Admitting: Family Medicine

## 2017-11-23 ENCOUNTER — Encounter: Payer: Self-pay | Admitting: Unknown Physician Specialty

## 2017-11-23 LAB — SURGICAL PATHOLOGY

## 2017-11-23 MED ORDER — DOXYCYCLINE HYCLATE 100 MG PO TABS: 100.0000 mg | ORAL_TABLET | Freq: Two times a day (BID) | ORAL | 0 refills | Status: DC

## 2017-11-23 NOTE — Telephone Encounter (Signed)
Triaged call with patient. Patient stated she was not sure the sore on her ear is a bug bite. Patient stated that sore was on her when woke up this morning. It was red, swollen and itchy. Patient stated that the sore has worsened throughout the day. Patient stated that sore is hot to the tough and very sensitive. Per Dr. Sullivan Lone sent doxycycline 100 mg bid to pharmacy. Patient can take Advil for pain and use Benadryl for itching. Patient advised to schedule appt to see a provider tomorrow. Patient is scheduled to see Dr. Sherrie Mustache at 8:00 am.

## 2017-11-23 NOTE — Telephone Encounter (Signed)
Pt states she was bitten on the ear yesterday sometime.  The bit is tender and ear is swollen.  Pt states she was also given IV antibiotics.

## 2017-11-24 ENCOUNTER — Encounter: Payer: Self-pay | Admitting: Family Medicine

## 2017-11-24 ENCOUNTER — Ambulatory Visit (INDEPENDENT_AMBULATORY_CARE_PROVIDER_SITE_OTHER): Payer: Medicare PPO | Admitting: Family Medicine

## 2017-11-24 VITALS — BP 138/82 | HR 56 | Temp 98.6°F | Resp 16 | Wt 143.0 lb

## 2017-11-24 DIAGNOSIS — H60501 Unspecified acute noninfective otitis externa, right ear: Secondary | ICD-10-CM | POA: Diagnosis not present

## 2017-11-24 MED ORDER — NEOMYCIN-POLYMYXIN-HC 3.5-10000-1 OT SOLN: 3.0000 [drp] | Freq: Four times a day (QID) | OTIC | 0 refills | Status: AC

## 2017-11-24 NOTE — Progress Notes (Deleted)
Patient: Heidi Hernandez Female    DOB: 07-06-1950   67 y.o.   MRN: [id number] Visit Date: 11/24/2017  Today's Provider: [name], MD   No chief complaint on file.  Subjective:    HPI  Patient is here concerning a bite or sore on her ear. Patient stated she was not sure the sore on her ear is a bug bite. Patient stated that sore was on her when she woke up yesterday morning. It was red, swollen and itchy. Patient stated that the sore worsened throughout the day. Patient stated that sore is hot to the tough and very sensitive. Per Dr. Sullivan Lone sent doxycycline 100 mg bid to pharmacy. Patient was advised she can take Advil for pain and use Benadryl for itching. Patient advised to schedule appt to see a provider.   Allergies  Allergen Reactions  . Augmentin [Amoxicillin-Pot Clavulanate] Nausea And Vomiting  . Droperidol Other (See Comments)    "Locked jaw" Facial muscles locked  . Morphine Itching  . Paxil [Paroxetine Hcl] Other (See Comments)    syncope     Current Outpatient Medications:  .  acetaminophen (TYLENOL) 500 MG tablet, Take 2 tablets (1,000 mg total) by mouth every 8 (eight) hours., Disp: 90 tablet, Rfl: 2 .  albuterol (VENTOLIN HFA) 108 (90 Base) MCG/ACT inhaler, Inhale 2 puffs into the lungs every 6 (six) hours as needed for wheezing. May substitute generic or equivalent brand name, Disp: 18 g, Rfl: 1 .  alendronate (FOSAMAX) 70 MG tablet, Take 1 tablet (70 mg total) by mouth once a week. (Patient not taking: Reported on 11/22/2017), Disp: 12 tablet, Rfl: 4 .  amLODipine (NORVASC) 2.5 MG tablet, Take 1 tablet (2.5 mg total) by mouth daily. (Patient not taking: Reported on 11/22/2017), Disp: 90 tablet, Rfl: 3 .  aspirin 325 MG EC tablet, Take 1 tablet (325 mg total) by mouth daily. (Patient not taking: Reported on 11/22/2017), Disp: 42 tablet, Rfl: 0 .  atenolol (TENORMIN) 50 MG tablet, TAKE 1 TABLET BY MOUTH ONCE DAILY (Patient taking differently: Take 50 mg by  mouth daily), Disp: 30 tablet, Rfl: 5 .  azelastine (ASTELIN) 0.1 % nasal spray, Place 2 sprays into both nostrils daily. , Disp: , Rfl:  .  chlorpheniramine-HYDROcodone (TUSSIONEX PENNKINETIC ER) 10-8 MG/5ML SUER, Take 5 mLs by mouth 2 (two) times daily., Disp: 115 mL, Rfl: 0 .  cholestyramine (QUESTRAN) 4 GM/DOSE powder, Take 1 packet (4 g total) by mouth 2 (two) times daily with a meal. (Patient not taking: Reported on 11/22/2017), Disp: 378 g, Rfl: 12 .  doxycycline (VIBRA-TABS) 100 MG tablet, Take 1 tablet (100 mg total) by mouth 2 (two) times daily., Disp: 20 tablet, Rfl: 0 .  Fexofenadine HCl (ALLEGRA PO), Take 1 capsule by mouth daily., Disp: , Rfl:  .  fluticasone (FLONASE) 50 MCG/ACT nasal spray, Place 2 sprays into both nostrils daily. , Disp: , Rfl:  .  fluticasone (FLOVENT HFA) 44 MCG/ACT inhaler, Inhale 1 puff into the lungs daily., Disp: , Rfl:  .  LORazepam (ATIVAN) 1 MG tablet, Take 0.5 tablets (0.5 mg total) by mouth every 6 (six) hours as needed for anxiety., Disp: 15 tablet, Rfl: 4 .  montelukast (SINGULAIR) 10 MG tablet, Take by mouth., Disp: , Rfl:  .  mupirocin cream (BACTROBAN) 2 %, Apply 1 application topically 2 (two) times daily., Disp: 30 g, Rfl: 0 .  pramipexole (MIRAPEX) 1 MG tablet, TAKE 1 TABLET BY MOUTH TWICE DAILY,  Disp: 180 tablet, Rfl: 3 .  RABEprazole (ACIPHEX) 20 MG tablet, Take 20 mg by mouth 2 (two) times daily. , Disp: , Rfl:  .  sucralfate (CARAFATE) 1 g tablet, Take 1 g by mouth 2 (two) times daily. , Disp: , Rfl:  .  SUMAtriptan (IMITREX) 100 MG tablet, TAKE 1 AT ONSET OF HEADACHE.  MAY TAKE AN ADDITIONAL TABLET IN 2 HOURS IF NEEEDED BUT ONLY 2 TABLETS IN 24 HOURS (Patient taking differently: TAKE 100 MG AT ONSET OF HEADACHE.  MAY TAKE AN ADDITIONAL TABLET IN 2 HOURS IF NEEEDED BUT ONLY 2 TABLETS IN 24 HOURS), Disp: 9 tablet, Rfl: 5 .  triamcinolone ointment (KENALOG) 0.5 %, Apply 1 application topically 2 (two) times daily., Disp: 30 g, Rfl: 0  Review of  Systems  Constitutional: Negative for appetite change, chills, fatigue and fever.  Respiratory: Negative for chest tightness and shortness of breath.   Cardiovascular: Negative for chest pain and palpitations.  Gastrointestinal: Negative for abdominal pain, nausea and vomiting.  Neurological: Negative for dizziness and weakness.    Social History   Tobacco Use  . Smoking status: Former Smoker    Packs/day: 1.00    Years: 10.00    Pack years: 10.00    Types: Cigarettes    Last attempt to quit: 11/10/1979    Years since quitting: 38.0  . Smokeless tobacco: Never Used  Substance Use Topics  . Alcohol use: Yes    Alcohol/week: 0.0 oz    Comment: occasional- wine   Objective:   There were no vitals taken for this visit. There were no vitals filed for this visit.   Physical Exam      Assessment & Plan:           Mila Merryonald Fisher, MD  [facility]

## 2017-11-24 NOTE — Progress Notes (Signed)
Patient: Heidi Hernandez Female    DOB: 12/19/1950   67 y.o.   MRN: [id number] Visit Date: 11/24/2017  Today's Provider: Mila Merryonald Maalik Pinn, MD   Chief Complaint  Patient presents with  . Ear Pain   Subjective:    Otalgia   There is pain in the right ear. This is a new problem. The current episode started yesterday. The problem occurs constantly. The problem has been gradually worsening. There has been no fever. Associated symptoms include hearing loss (decreased hearing in right ear). Pertinent negatives include no abdominal pain, neck pain or vomiting. Associated symptoms comments: Redness of the right ear, itching of the right ear, swelling of the right ear. Treatments tried: Doxycycline and Benadryl. The treatment provided no relief.  Thinks she got bit by mosquitos yesterday on back and possibly her ear.     Allergies  Allergen Reactions  . Augmentin [Amoxicillin-Pot Clavulanate] Nausea And Vomiting  . Droperidol Other (See Comments)    "Locked jaw" Facial muscles locked  . Morphine Itching  . Paxil [Paroxetine Hcl] Other (See Comments)    syncope     Current Outpatient Medications:  .  acetaminophen (TYLENOL) 500 MG tablet, Take 2 tablets (1,000 mg total) by mouth every 8 (eight) hours., Disp: 90 tablet, Rfl: 2 .  albuterol (VENTOLIN HFA) 108 (90 Base) MCG/ACT inhaler, Inhale 2 puffs into the lungs every 6 (six) hours as needed for wheezing. May substitute generic or equivalent brand name, Disp: 18 g, Rfl: 1 .  alendronate (FOSAMAX) 70 MG tablet, Take 1 tablet (70 mg total) by mouth once a week., Disp: 12 tablet, Rfl: 4 .  amLODipine (NORVASC) 2.5 MG tablet, Take 1 tablet (2.5 mg total) by mouth daily., Disp: 90 tablet, Rfl: 3 .  aspirin 325 MG EC tablet, Take 1 tablet (325 mg total) by mouth daily., Disp: 42 tablet, Rfl: 0 .  atenolol (TENORMIN) 50 MG tablet, TAKE 1 TABLET BY MOUTH ONCE DAILY (Patient taking differently: Take 50 mg by mouth daily), Disp: 30 tablet, Rfl:  5 .  azelastine (ASTELIN) 0.1 % nasal spray, Place 2 sprays into both nostrils daily. , Disp: , Rfl:  .  chlorpheniramine-HYDROcodone (TUSSIONEX PENNKINETIC ER) 10-8 MG/5ML SUER, Take 5 mLs by mouth 2 (two) times daily., Disp: 115 mL, Rfl: 0 .  cholestyramine (QUESTRAN) 4 GM/DOSE powder, Take 1 packet (4 g total) by mouth 2 (two) times daily with a meal., Disp: 378 g, Rfl: 12 .  doxycycline (VIBRA-TABS) 100 MG tablet, Take 1 tablet (100 mg total) by mouth 2 (two) times daily., Disp: 20 tablet, Rfl: 0 .  Fexofenadine HCl (ALLEGRA PO), Take 1 capsule by mouth daily., Disp: , Rfl:  .  fluticasone (FLONASE) 50 MCG/ACT nasal spray, Place 2 sprays into both nostrils daily. , Disp: , Rfl:  .  fluticasone (FLOVENT HFA) 44 MCG/ACT inhaler, Inhale 1 puff into the lungs daily., Disp: , Rfl:  .  LORazepam (ATIVAN) 1 MG tablet, Take 0.5 tablets (0.5 mg total) by mouth every 6 (six) hours as needed for anxiety., Disp: 15 tablet, Rfl: 4 .  montelukast (SINGULAIR) 10 MG tablet, Take by mouth., Disp: , Rfl:  .  mupirocin cream (BACTROBAN) 2 %, Apply 1 application topically 2 (two) times daily., Disp: 30 g, Rfl: 0 .  pramipexole (MIRAPEX) 1 MG tablet, TAKE 1 TABLET BY MOUTH TWICE DAILY, Disp: 180 tablet, Rfl: 3 .  RABEprazole (ACIPHEX) 20 MG tablet, Take 20 mg by mouth 2 (two) times  daily. , Disp: , Rfl:  .  sucralfate (CARAFATE) 1 g tablet, Take 1 g by mouth 2 (two) times daily. , Disp: , Rfl:  .  SUMAtriptan (IMITREX) 100 MG tablet, TAKE 1 AT ONSET OF HEADACHE.  MAY TAKE AN ADDITIONAL TABLET IN 2 HOURS IF NEEEDED BUT ONLY 2 TABLETS IN 24 HOURS (Patient taking differently: TAKE 100 MG AT ONSET OF HEADACHE.  MAY TAKE AN ADDITIONAL TABLET IN 2 HOURS IF NEEEDED BUT ONLY 2 TABLETS IN 24 HOURS), Disp: 9 tablet, Rfl: 5 .  triamcinolone ointment (KENALOG) 0.5 %, Apply 1 application topically 2 (two) times daily., Disp: 30 g, Rfl: 0  Review of Systems  Constitutional: Negative for appetite change, chills, diaphoresis,  fatigue and fever.  HENT: Positive for ear pain, facial swelling (right ear) and hearing loss (decreased hearing in right ear).   Respiratory: Negative for chest tightness and shortness of breath.   Cardiovascular: Negative for chest pain and palpitations.  Gastrointestinal: Negative for abdominal pain, nausea and vomiting.  Musculoskeletal: Negative for neck pain.  Neurological: Negative for dizziness and weakness.    Social History   Tobacco Use  . Smoking status: Former Smoker    Packs/day: 1.00    Years: 10.00    Pack years: 10.00    Types: Cigarettes    Last attempt to quit: 11/10/1979    Years since quitting: 38.0  . Smokeless tobacco: Never Used  Substance Use Topics  . Alcohol use: Yes    Alcohol/week: 0.0 oz    Comment: occasional- wine   Objective:   BP 138/82 (BP Location: Left Arm, Patient Position: Sitting, Cuff Size: Normal)   Pulse (!) 56   Temp 98.6 F (37 C) (Oral)   Resp 16   Wt 143 lb (64.9 kg)   SpO2 97% Comment: room air  BMI 24.93 kg/m  Vitals:   11/24/17 0807  BP: 138/82  Pulse: (!) 56  Resp: 16  Temp: 98.6 F (37 C)  TempSrc: Oral  SpO2: 97%  Weight: 143 lb (64.9 kg)     Physical Exam  General Appearance:    Alert, cooperative, no distress  HENT:   Right ear canal moderately swollen. At least one papule c/w insect bit behind auricle. Slightly tender to palpation.   Eyes:    PERRL, conjunctiva/corneas clear, EOM's intact            Assessment & Plan:     1. Acute otitis externa of right ear, unspecified type Likely secondary to insect bite. Is already on doxycycline called in yesterday. Will add neomycin-polymyxin-hydrocortisone (CORTISPORIN) OTIC solution; Place 3 drops into the right ear 4 (four) times daily for 7 days.  Dispense: 10 mL; Refill: 0  Call if symptoms change or if not rapidly improving.           [name], MD  [facility]

## 2017-11-27 NOTE — Anesthesia Postprocedure Evaluation (Signed)
Anesthesia Post Note  Patient: Heidi Hernandez  Procedure(s) Performed: ESOPHAGOGASTRODUODENOSCOPY (EGD) WITH PROPOFOL (N/A )  Patient location during evaluation: PACU Anesthesia Type: General Level of consciousness: awake and alert Pain management: pain level controlled Vital Signs Assessment: post-procedure vital signs reviewed and stable Respiratory status: spontaneous breathing, nonlabored ventilation, respiratory function stable and patient connected to nasal cannula oxygen Cardiovascular status: blood pressure returned to baseline and stable Postop Assessment: no apparent nausea or vomiting Anesthetic complications: no     Last Vitals:  Vitals:   11/22/17 1130 11/22/17 1146  BP:  103/65  Pulse: 68 (!) 54  Resp: 18 14  Temp:    SpO2: 97% 97%    Last Pain:  Vitals:   11/24/17 0719  TempSrc:   PainSc: 0-No pain                 Yevette EdwardsJames G Reveca Desmarais

## 2017-12-06 ENCOUNTER — Encounter: Payer: Self-pay | Admitting: Family Medicine

## 2017-12-06 ENCOUNTER — Ambulatory Visit (INDEPENDENT_AMBULATORY_CARE_PROVIDER_SITE_OTHER): Payer: Medicare PPO | Admitting: Family Medicine

## 2017-12-06 VITALS — BP 132/84 | HR 69 | Temp 98.6°F | Resp 16 | Wt 139.0 lb

## 2017-12-06 DIAGNOSIS — B09 Unspecified viral infection characterized by skin and mucous membrane lesions: Secondary | ICD-10-CM

## 2017-12-06 MED ORDER — VALACYCLOVIR HCL 1 G PO TABS: 1000.0000 mg | ORAL_TABLET | Freq: Two times a day (BID) | ORAL | 0 refills | Status: AC

## 2017-12-06 MED ORDER — DOXYCYCLINE HYCLATE 100 MG PO TABS: 100.0000 mg | ORAL_TABLET | Freq: Two times a day (BID) | ORAL | 0 refills | Status: DC

## 2017-12-06 NOTE — Progress Notes (Signed)
Patient: Heidi Hernandez Female    DOB: [birth year]   67 y.o.   MRN: [id number] Visit Date: 12/06/2017  Today's Provider: [name], MD   Chief Complaint  Patient presents with  . Rash    x 1 week   Subjective:    Rash  This is a new problem. Episode onset: 1 week ago. The problem has been gradually worsening since onset. The affected locations include the chest, torso, back, left arm, left lower leg and left upper leg (also left ear). The rash is characterized by blistering, redness, itchiness and pain. Associated symptoms include fatigue. Pertinent negatives include no fever, shortness of breath or vomiting.  Patient was bitten by a tick several months ago.     Allergies  Allergen Reactions  . Augmentin [Amoxicillin-Pot Clavulanate] Nausea And Vomiting  . Droperidol Other (See Comments)    "Locked jaw" Facial muscles locked  . Morphine Itching  . Paxil [Paroxetine Hcl] Other (See Comments)    syncope     Current Outpatient Medications:  .  acetaminophen (TYLENOL) 500 MG tablet, Take 2 tablets (1,000 mg total) by mouth every 8 (eight) hours., Disp: 90 tablet, Rfl: 2 .  albuterol (VENTOLIN HFA) 108 (90 Base) MCG/ACT inhaler, Inhale 2 puffs into the lungs every 6 (six) hours as needed for wheezing. May substitute generic or equivalent brand name, Disp: 18 g, Rfl: 1 .  alendronate (FOSAMAX) 70 MG tablet, Take 1 tablet (70 mg total) by mouth once a week., Disp: 12 tablet, Rfl: 4 .  amLODipine (NORVASC) 2.5 MG tablet, Take 1 tablet (2.5 mg total) by mouth daily., Disp: 90 tablet, Rfl: 3 .  aspirin 325 MG EC tablet, Take 1 tablet (325 mg total) by mouth daily., Disp: 42 tablet, Rfl: 0 .  atenolol (TENORMIN) 50 MG tablet, TAKE 1 TABLET BY MOUTH ONCE DAILY (Patient taking differently: Take 50 mg by mouth daily), Disp: 30 tablet, Rfl: 5 .  azelastine (ASTELIN) 0.1 % nasal spray, Place 2 sprays into both nostrils daily. , Disp: , Rfl:  .  chlorpheniramine-HYDROcodone  (TUSSIONEX PENNKINETIC ER) 10-8 MG/5ML SUER, Take 5 mLs by mouth 2 (two) times daily., Disp: 115 mL, Rfl: 0 .  cholestyramine (QUESTRAN) 4 GM/DOSE powder, Take 1 packet (4 g total) by mouth 2 (two) times daily with a meal., Disp: 378 g, Rfl: 12 .  doxycycline (VIBRA-TABS) 100 MG tablet, Take 1 tablet (100 mg total) by mouth 2 (two) times daily., Disp: 20 tablet, Rfl: 0 .  Fexofenadine HCl (ALLEGRA PO), Take 1 capsule by mouth daily., Disp: , Rfl:  .  fluticasone (FLONASE) 50 MCG/ACT nasal spray, Place 2 sprays into both nostrils daily. , Disp: , Rfl:  .  fluticasone (FLOVENT HFA) 44 MCG/ACT inhaler, Inhale 1 puff into the lungs daily., Disp: , Rfl:  .  LORazepam (ATIVAN) 1 MG tablet, Take 0.5 tablets (0.5 mg total) by mouth every 6 (six) hours as needed for anxiety., Disp: 15 tablet, Rfl: 4 .  montelukast (SINGULAIR) 10 MG tablet, Take by mouth., Disp: , Rfl:  .  mupirocin cream (BACTROBAN) 2 %, Apply 1 application topically 2 (two) times daily., Disp: 30 g, Rfl: 0 .  pramipexole (MIRAPEX) 1 MG tablet, TAKE 1 TABLET BY MOUTH TWICE DAILY, Disp: 180 tablet, Rfl: 3 .  RABEprazole (ACIPHEX) 20 MG tablet, Take 20 mg by mouth 2 (two) times daily. , Disp: , Rfl:  .  sucralfate (CARAFATE) 1 g tablet, Take 1 g by mouth  2 (two) times daily. , Disp: , Rfl:  .  SUMAtriptan (IMITREX) 100 MG tablet, TAKE 1 AT ONSET OF HEADACHE.  MAY TAKE AN ADDITIONAL TABLET IN 2 HOURS IF NEEEDED BUT ONLY 2 TABLETS IN 24 HOURS (Patient taking differently: TAKE 100 MG AT ONSET OF HEADACHE.  MAY TAKE AN ADDITIONAL TABLET IN 2 HOURS IF NEEEDED BUT ONLY 2 TABLETS IN 24 HOURS), Disp: 9 tablet, Rfl: 5 .  triamcinolone ointment (KENALOG) 0.5 %, Apply 1 application topically 2 (two) times daily., Disp: 30 g, Rfl: 0  Review of Systems  Constitutional: Positive for fatigue. Negative for appetite change, chills and fever.       Malaise  Respiratory: Negative for chest tightness and shortness of breath.   Cardiovascular: Negative for chest  pain and palpitations.  Gastrointestinal: Negative for abdominal pain, nausea and vomiting.  Musculoskeletal: Positive for myalgias (on left side).  Skin: Positive for rash.       Itchy skin  Neurological: Positive for numbness and headaches. Negative for dizziness and weakness.    Social History   Tobacco Use  . Smoking status: Former Smoker    Packs/day: 1.00    Years: 10.00    Pack years: 10.00    Types: Cigarettes    Last attempt to quit: 11/10/1979    Years since quitting: 38.0  . Smokeless tobacco: Never Used  Substance Use Topics  . Alcohol use: Yes    Alcohol/week: 0.0 oz    Comment: occasional- wine   Objective:   BP 132/84 (BP Location: Left Arm, Patient Position: Sitting, Cuff Size: Normal)   Pulse 69   Temp 98.6 F (37 C) (Oral)   Resp 16   Wt 139 lb (63 kg)   SpO2 96% Comment: room air  BMI 24.24 kg/m  Vitals:   12/06/17 1123  BP: 132/84  Pulse: 69  Resp: 16  Temp: 98.6 F (37 C)  TempSrc: Oral  SpO2: 96%  Weight: 139 lb (63 kg)     Physical Exam  General appearance: alert, well developed, well nourished, cooperative and in no distress Head: Normocephalic, without obvious abnormality, atraumatic Respiratory: Respirations even and unlabored, normal respiratory rate Extremities: Widely scatter vesicular eruptions on left side of neck, ear, upper back and leg. No lesions on right side of body. No drainage, no underlying erythema.      Assessment & Plan:     1. Viral exanthem (suspected) Not typical for shingles, but may be atypical presentation. Cover for bacterial and tick born infections as well.  - doxycycline (VIBRA-TABS) 100 MG tablet; Take 1 tablet (100 mg total) by mouth 2 (two) times daily.  Dispense: 20 tablet; Refill: 0 - valACYclovir (VALTREX) 1000 MG tablet; Take 1 tablet (1,000 mg total) by mouth 2 (two) times daily for 10 days.  Dispense: 20 tablet; Refill: 0  Call if symptoms change or if not rapidly improving.           Mila Merryonald Fisher, MD  [facility]

## 2017-12-15 IMAGING — CT CT SHOULDER*R* W/O CM
1 series · 12 of 14 positions shown, 15 images · non-contrast
Comparison: 03/10/2017 MRI

CLINICAL DATA: Preoperative planning for reverse shoulder
replacement. Right shoulder pain, prior rotator cuff surgery.

EXAM:
CT OF THE UPPER RIGHT EXTREMITY WITHOUT CONTRAST
TECHNIQUE: Multidetector CT imaging of the upper right extremity was performed
according to the standard protocol.

[Series 5: ax st · axial · 0.39mm/px · z∈[-567,-407]mm · 12 of 193 slices shown, 15 images]
[im 15/193  soft-tissue]
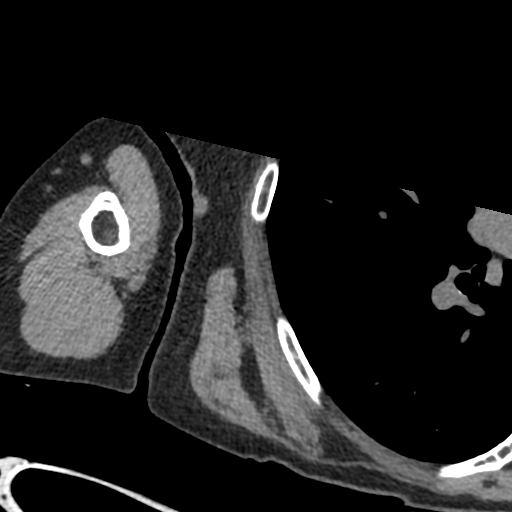
[im 15/193  bone]
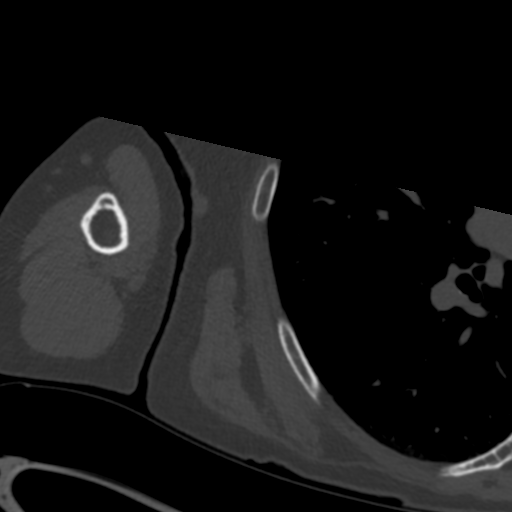
[im 30/193  bone]
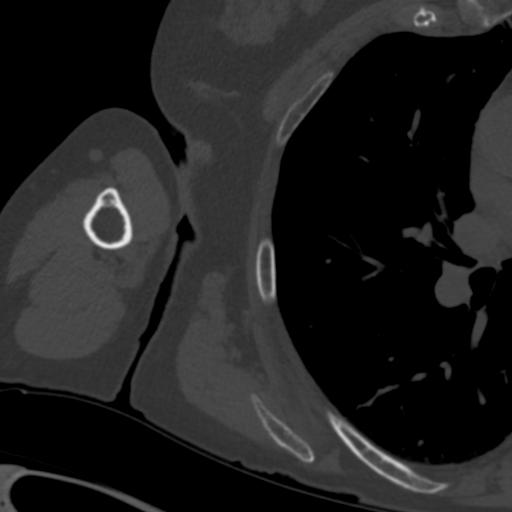
[im 45/193  bone]
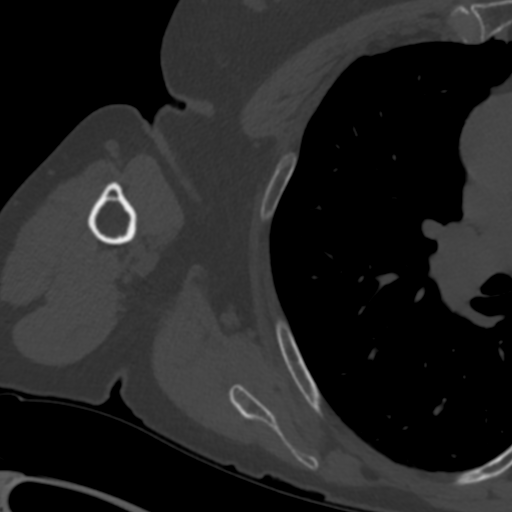
[im 60/193  bone]
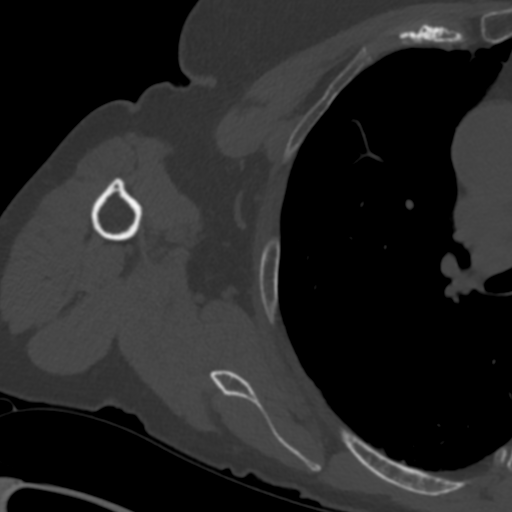
[im 74/193  soft-tissue]
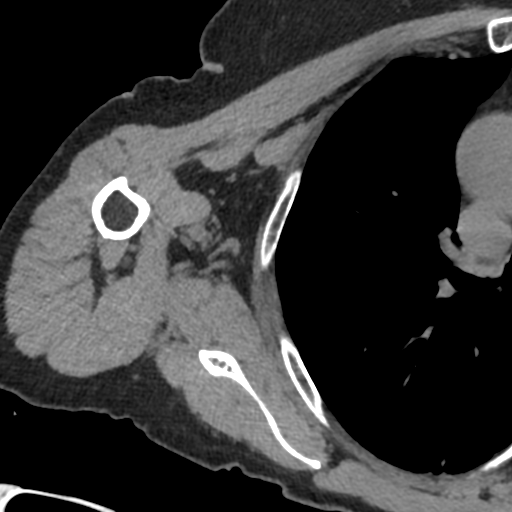
[im 74/193  bone]
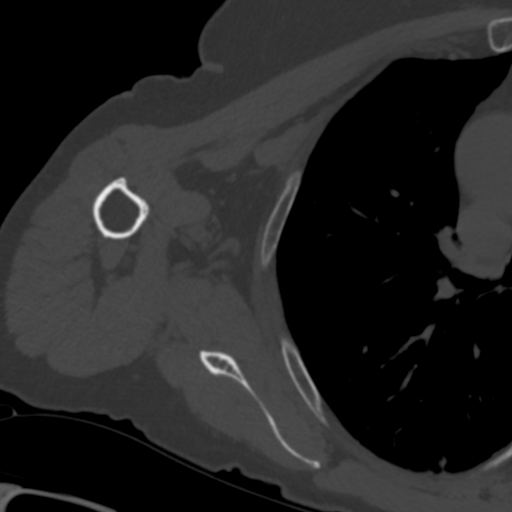
[im 89/193  bone]
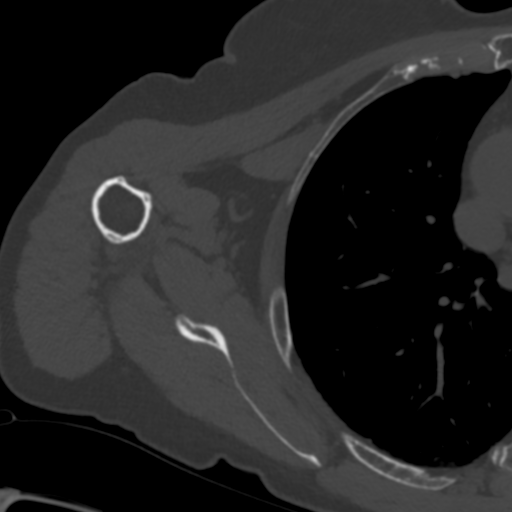
[im 104/193  bone]
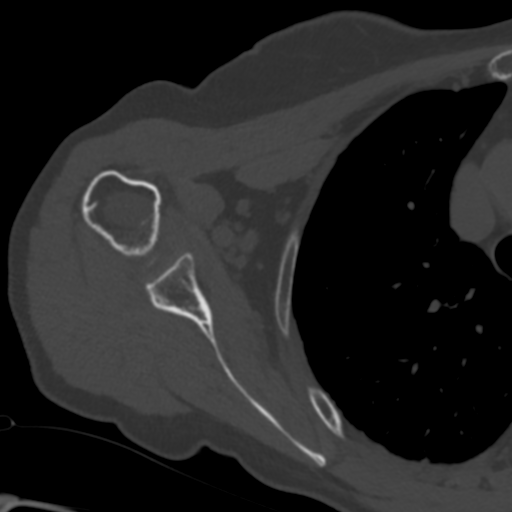
[im 119/193  bone]
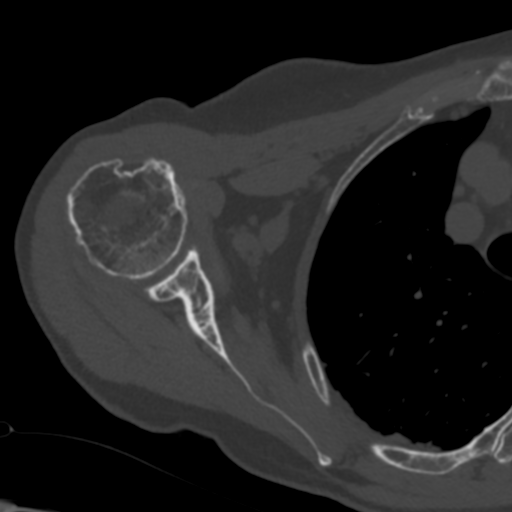
[im 133/193  soft-tissue]
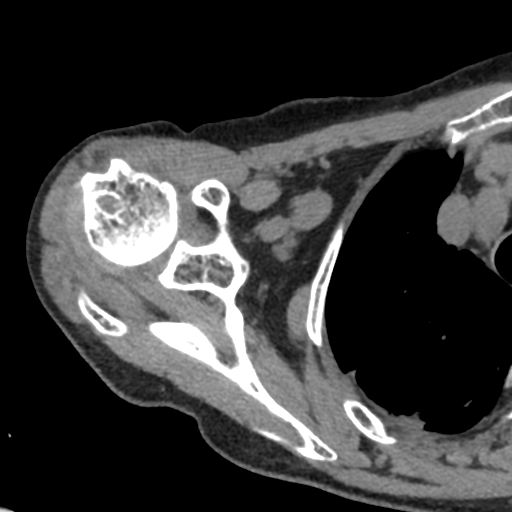
[im 133/193  bone]
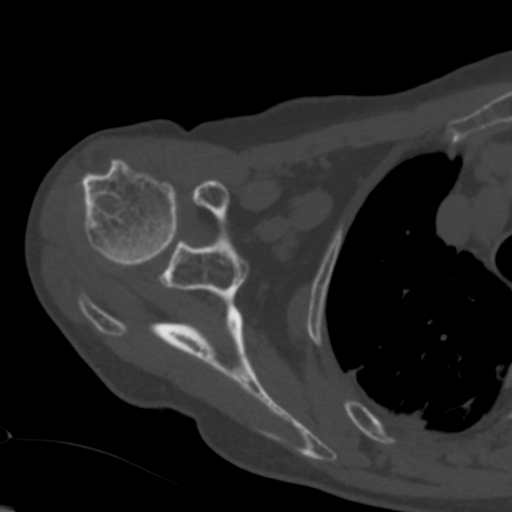
[im 148/193  bone]
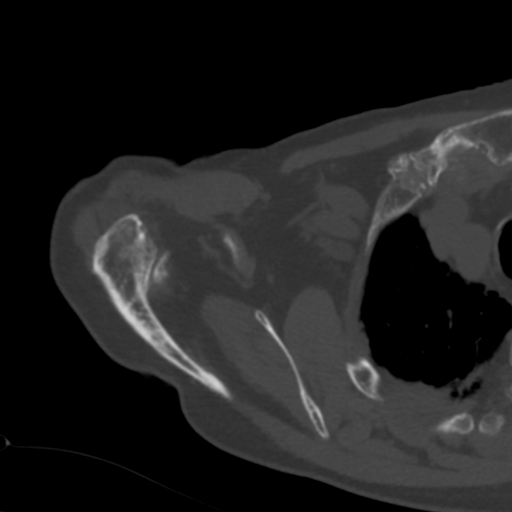
[im 163/193  bone]
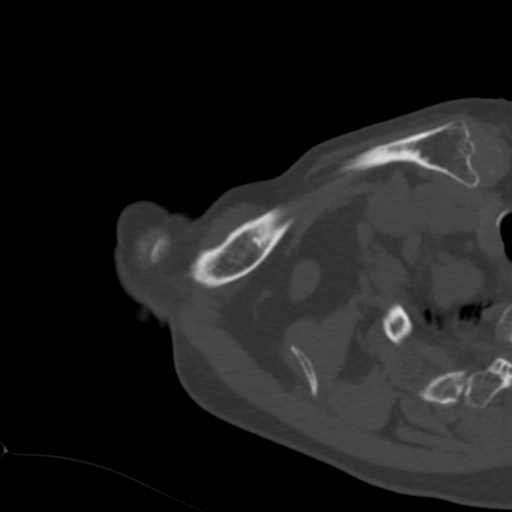
[im 178/193  bone]
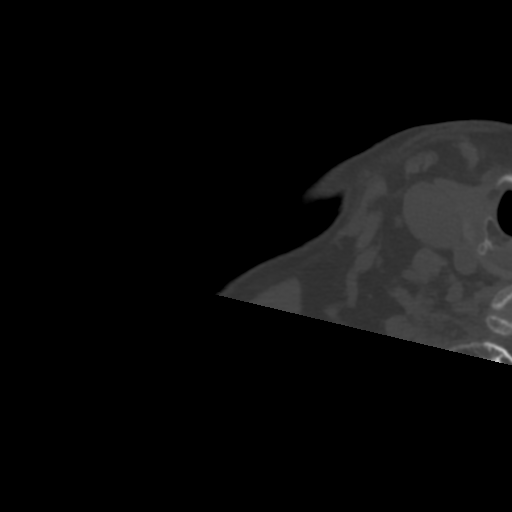

[12 of 14 positions shown; findings below may reference images not displayed]

FINDINGS: Bones/Joint/Cartilage

The moderate degenerative acromioclavicular joint spurring. There is
subacromial spurring along the coracoacromial ligament and close
approximation of the humeral head to the acromion reflecting the
underlying an rotator cuff tear.

Cystic lesions along the humeral head extending down to the level of
the surgical neck region ; this have high T2 signal and appear to
extend to the greater tuberosity region characteristic for a geode
on MRI, but has the somewhat unusual feature of limited marginal
sclerosis/cortication on CT.

Mild superior migration of the humerus. Mild degenerative
glenohumeral spurring with suspected chondral thinning along the
anterior superior portion of the glenohumeral joint.

No loose free fragment in the glenohumeral joint.

Subacromial morphology is type 2 (curved).

Old screw tracks in the humerus compatible with prior rotator cuff
repair noted.

Ligaments

Suboptimally assessed by CT.

Muscles and Tendons

No overt regional muscular atrophy.

Soft tissues

Right apical pleuroparenchymal scarring. There is some tortuosity of
the branch vasculature along the right upper mediastinal margin.
IMPRESSION: 1. AC joint and glenohumeral arthropathy.
2. Rotator cuff tear with mild superior subluxation of the humerus
against the acromion. There is also a notable subacromial spur along
the attachment of the coracoacromial ligament.
3. The fluid signal intensity lesions in the distal humerus which
appear to extend to the greater tuberosity characteristic of geode
do not have significant marginal sclerosis or cortication, which is
somewhat unusual for geode, but given the nonaggressive appearance
on CT, geode is still probably the most likely cause.
4. Right apical pleuroparenchymal scarring.

## 2017-12-16 ENCOUNTER — Other Ambulatory Visit: Payer: Self-pay | Admitting: Family Medicine

## 2017-12-16 DIAGNOSIS — G2581 Restless legs syndrome: Secondary | ICD-10-CM

## 2018-01-12 NOTE — Progress Notes (Signed)
Patient: Heidi Hernandez, Female    DOB: 11/24/1950, 67 y.o.   MRN: [id number] Visit Date: 01/15/2018  Today's Provider: Mila Merryonald Raynell Scott, MD   Chief Complaint  Patient presents with  . Annual Exam  . Hypertension  . Osteoporosis   Subjective:   Patient saw McKenzie for AWV today at 9:40 am.   Complete Physical Heidi PaoCarol M Bugge is a 67 y.o. female. She feels fairly well. She reports exercising daily. She reports she is sleeping poorly.  -----------------------------------------------------------   Hypertension, follow-up:  BP Readings from Last 3 Encounters:  01/15/18 140/82  01/15/18 134/72  12/06/17 132/84    She was last seen for hypertension 2 months ago.  BP at that visit was 170/88. Management since that visit includes; started amlodipine 2.5 mg qd.She reports good compliance with treatment. She is not having side effects.  She is exercising. She is adherent to low salt diet.   Outside blood pressures are 130/70. She is experiencing none.  Patient denies chest pain, chest pressure/discomfort, claudication, dyspnea, exertional chest pressure/discomfort, fatigue, irregular heart beat, lower extremity edema, near-syncope, orthopnea, palpitations, paroxysmal nocturnal dyspnea, syncope and tachypnea.   Cardiovascular risk factors include advanced age (older than 7455 for men, 6565 for women) and hypertension.  Use of agents associated with hypertension: none.   ------------------------------------------------------------------------  Osteoporosis without current pathological fracture, unspecified osteoporosis type From 01/13/2017-no changes. Continue alendronate. Patient reports that she has not taken Alendronate for several months. She states she ran out of the medication.  Vitamin D deficiency From 01/13/2017-labs checked, no changes. Patient is not currently taking any Vitamin D supplements.   She also reports she has had some clear nasal and post nasal drainage  the last week. She was treated for bronchitis last month which had resolved, but she has been coughing a little bit again over the weekend. No fevers or dyspnea.     Review of Systems  Constitutional: Negative for chills, fatigue and fever.  HENT: Positive for postnasal drip and voice change. Negative for congestion, ear pain, rhinorrhea, sneezing and sore throat.   Eyes: Negative.  Negative for pain and redness.  Respiratory: Negative for cough, shortness of breath and wheezing.   Cardiovascular: Negative for chest pain and leg swelling.  Gastrointestinal: Negative for abdominal pain, blood in stool, constipation, diarrhea and nausea.  Endocrine: Negative for polydipsia and polyphagia.  Genitourinary: Negative.  Negative for dysuria, flank pain, hematuria, pelvic pain, vaginal bleeding and vaginal discharge.  Musculoskeletal: Positive for arthralgias (left shoulder pain). Negative for back pain, gait problem and joint swelling.  Skin: Negative for rash.  Neurological: Negative.  Negative for dizziness, tremors, seizures, weakness, light-headedness, numbness and headaches.  Hematological: Negative for adenopathy.  Psychiatric/Behavioral: Negative.  Negative for behavioral problems, confusion and dysphoric mood. The patient is not nervous/anxious and is not hyperactive.     Social History   Socioeconomic History  . Marital status: Married    Spouse name: Not on file  . Number of children: 2  . Years of education: Not on file  . Highest education level: Bachelor's degree (e.g., BA, AB, BS)  Occupational History  . Occupation: Retired  Engineer, productionocial Needs  . Financial resource strain: Not very hard  . Food insecurity:    Worry: Never true    Inability: Never true  . Transportation needs:    Medical: No    Non-medical: No  Tobacco Use  . Smoking status: Former Smoker    Packs/day: 1.00  Years: 10.00    Pack years: 10.00    Types: Cigarettes    Last attempt to quit: 11/10/1979     Years since quitting: 38.2  . Smokeless tobacco: Never Used  Substance and Sexual Activity  . Alcohol use: Yes    Alcohol/week: 0.0 oz    Comment: occasional- wine  . Drug use: No  . Sexual activity: Never  Lifestyle  . Physical activity:    Days per week: Not on file    Minutes per session: Not on file  . Stress: To some extent  Relationships  . Social connections:    Talks on phone: Not on file    Gets together: Not on file    Attends religious service: Not on file    Active member of club or organization: Not on file    Attends meetings of clubs or organizations: Not on file    Relationship status: Not on file  . Intimate partner violence:    Fear of current or ex partner: Not on file    Emotionally abused: Not on file    Physically abused: Not on file    Forced sexual activity: Not on file  Other Topics Concern  . Not on file  Social History Narrative  . Not on file    Past Medical History:  Diagnosis Date  . Anxiety   . Asthma   . GERD (gastroesophageal reflux disease)   . Headache    Migraines  . Hypertension   . Idiopathic thrombocytopenia purpura (HCC)    Required splenectomy  . MVP (mitral valve prolapse)   . Neuromuscular disorder (HCC)    Restless Leg Syndrome  . Pancreatitis April 2017 [facility] due to cholecystitis.   . Pneumonia    06-2015     Patient Active Problem List   Diagnosis Date Noted  . Status post shoulder replacement 04/21/2017  . Rupture of right distal biceps tendon 01/13/2017  . Gallstone pancreatitis 10/15/2015  . Chondrocalcinosis of left ankle 12/24/2014  . Allergic rhinitis 12/23/2014  . Anxiety 12/23/2014  . Asplenia after surgical procedure 12/23/2014  . History of ITP 12/23/2014  . OP (osteoporosis) 12/23/2014  . Restless legs syndrome 12/23/2014  . Neuralgia neuritis, sciatic nerve 12/23/2014  . Actinic keratosis 12/23/2014  . Mitral valve prolapse 12/23/2014  . LVH (left ventricular hypertrophy) 12/23/2014  .  Carpal tunnel syndrome 11/19/2014  . Trigger finger, acquired 11/19/2014  . Vitamin D deficiency 09/28/2012  . Dysthymia 04/14/2009  . Hypertension 04/14/2009  . Headache, migraine 04/14/2009  . Artificial menopause 04/14/2009  . Insomnia 04/14/2009  . Cardiac murmur 04/14/2009    Past Surgical History:  Procedure Laterality Date  . ABDOMINAL HYSTERECTOMY    . APPENDECTOMY    . CARPAL TUNNEL RELEASE Bilateral 11/19/2014   Procedure: CARPAL TUNNEL RELEASE;  Surgeon: Donato Heinz, MD;  Location: [facility] ORS;  Service: Orthopedics;  Laterality: Bilateral;  . CESAREAN SECTION     times 2  . CHOLECYSTECTOMY  09/29/2015   [facility]  . COLONOSCOPY N/A 05/02/2016   Procedure: COLONOSCOPY;  Surgeon: Scot Jun, MD;  Location: [facility] ENDOSCOPY;  Service: Endoscopy;  Laterality: N/A;  . ESOPHAGOGASTRODUODENOSCOPY (EGD) WITH PROPOFOL N/A 05/02/2016   Procedure: ESOPHAGOGASTRODUODENOSCOPY (EGD) WITH PROPOFOL;  Surgeon: Scot Jun, MD;  Location: [facility] ENDOSCOPY;  Service: Endoscopy;  Laterality: N/A;  . ESOPHAGOGASTRODUODENOSCOPY (EGD) WITH PROPOFOL N/A 11/22/2017   Procedure: ESOPHAGOGASTRODUODENOSCOPY (EGD) WITH PROPOFOL;  Surgeon: Scot Jun, MD;  Location: [facility] ENDOSCOPY;  Service: Endoscopy;  Laterality: N/A;  . HERNIA REPAIR     times 3  . JOINT REPLACEMENT    . OVARIAN CYST SURGERY    . SHOULDER ARTHROSCOPY WITH ROTATOR CUFF REPAIR     times 2  . SPLENECTOMY, TOTAL    . TOTAL SHOULDER ARTHROPLASTY Right 04/21/2017   Procedure: REVERSE SHOULDER ARTHROPLASTY;  Surgeon: Signa Kell, MD;  Location: [facility] ORS;  Service: Orthopedics;  Laterality: Right;  . TRIGGER FINGER RELEASE Right 11/19/2014   Procedure: RELEASE TRIGGER FINGER/A-1 PULLEY;  Surgeon: Donato Heinz, MD;  Location: [facility] ORS;  Service: Orthopedics;  Laterality: Right;  . TRIGGER FINGER RELEASE Right 08/12/2015   Procedure: RELEASE TRIGGER LONG FINGER;  Surgeon: Donato Heinz, MD;  Location: [facility] ORS;  Service:  Orthopedics;  Laterality: Right;    Her family history includes Hypertension in her mother; Renal Disease in her father.      Current Outpatient Medications:  .  acetaminophen (TYLENOL) 500 MG tablet, Take 2 tablets (1,000 mg total) by mouth every 8 (eight) hours., Disp: 90 tablet, Rfl: 2 .  albuterol (VENTOLIN HFA) 108 (90 Base) MCG/ACT inhaler, Inhale 2 puffs into the lungs every 6 (six) hours as needed for wheezing. May substitute generic or equivalent brand name, Disp: 18 g, Rfl: 1 .  alendronate (FOSAMAX) 70 MG tablet, Take 1 tablet (70 mg total) by mouth once a week. (Patient not taking: Reported on 01/15/2018), Disp: 12 tablet, Rfl: 4 .  amLODipine (NORVASC) 2.5 MG tablet, Take 1 tablet (2.5 mg total) by mouth daily., Disp: 90 tablet, Rfl: 3 .  aspirin 325 MG EC tablet, Take 1 tablet (325 mg total) by mouth daily. (Patient not taking: Reported on 01/15/2018), Disp: 42 tablet, Rfl: 0 .  atenolol (TENORMIN) 50 MG tablet, TAKE 1 TABLET BY MOUTH ONCE DAILY (Patient taking differently: Take 50 mg by mouth daily), Disp: 30 tablet, Rfl: 5 .  azelastine (ASTELIN) 0.1 % nasal spray, Place 2 sprays into both nostrils daily. , Disp: , Rfl:  .  chlorpheniramine-HYDROcodone (TUSSIONEX PENNKINETIC ER) 10-8 MG/5ML SUER, Take 5 mLs by mouth 2 (two) times daily. (Patient not taking: Reported on 01/15/2018), Disp: 115 mL, Rfl: 0 .  cholestyramine (QUESTRAN) 4 GM/DOSE powder, Take 1 packet (4 g total) by mouth 2 (two) times daily with a meal. (Patient not taking: Reported on 01/15/2018), Disp: 378 g, Rfl: 12 .  doxycycline (VIBRA-TABS) 100 MG tablet, Take 1 tablet (100 mg total) by mouth 2 (two) times daily. (Patient not taking: Reported on 01/15/2018), Disp: 20 tablet, Rfl: 0 .  Fexofenadine HCl (ALLEGRA PO), Take 1 capsule by mouth daily., Disp: , Rfl:  .  fluticasone (FLONASE) 50 MCG/ACT nasal spray, Place 2 sprays into both nostrils daily. , Disp: , Rfl:  .  fluticasone (FLOVENT HFA) 44 MCG/ACT inhaler, Inhale  1 puff into the lungs daily., Disp: , Rfl:  .  LORazepam (ATIVAN) 1 MG tablet, Take 0.5 tablets (0.5 mg total) by mouth every 6 (six) hours as needed for anxiety., Disp: 15 tablet, Rfl: 4 .  montelukast (SINGULAIR) 10 MG tablet, Take 10 mg by mouth at bedtime. , Disp: , Rfl:  .  mupirocin cream (BACTROBAN) 2 %, Apply 1 application topically 2 (two) times daily. (Patient not taking: Reported on 01/15/2018), Disp: 30 g, Rfl: 0 .  omeprazole (PRILOSEC) 40 MG capsule, Take 40 mg by mouth daily., Disp: , Rfl:  .  pramipexole (MIRAPEX) 1 MG tablet, TAKE 1 TABLET BY MOUTH TWICE DAILY, Disp:  180 tablet, Rfl: 3 .  RABEprazole (ACIPHEX) 20 MG tablet, Take 20 mg by mouth 2 (two) times daily. , Disp: , Rfl:  .  sucralfate (CARAFATE) 1 g tablet, Take 1 g by mouth 2 (two) times daily. , Disp: , Rfl:  .  SUMAtriptan (IMITREX) 100 MG tablet, TAKE 1 AT ONSET OF HEADACHE.  MAY TAKE AN ADDITIONAL TABLET IN 2 HOURS IF NEEEDED BUT ONLY 2 TABLETS IN 24 HOURS (Patient taking differently: TAKE 100 MG AT ONSET OF HEADACHE.  MAY TAKE AN ADDITIONAL TABLET IN 2 HOURS IF NEEEDED BUT ONLY 2 TABLETS IN 24 HOURS), Disp: 9 tablet, Rfl: 5 .  triamcinolone ointment (KENALOG) 0.5 %, Apply 1 application topically 2 (two) times daily. (Patient not taking: Reported on 01/15/2018), Disp: 30 g, Rfl: 0  Patient Care Team: Malva Limes, MD as PCP - General (Family Medicine) Scot Jun, MD as Consulting Physician (Gastroenterology) Signa Kell, MD as Consulting Physician (Orthopedic Surgery) Eileen Stanford, MD as Referring Physician (Allergy and Immunology) Dasher, Cliffton Asters, MD as Consulting Physician (Dermatology) Dingeldein, Viviann Spare, MD as Consulting Physician (Ophthalmology)     Objective:   Vitals: BP 140/82 (BP Location: Left Arm, Patient Position: Sitting, Cuff Size: Normal)   Vitals:   BP 170/72 (BP Location: Right Arm)   Pulse 63   Temp 98.3 F (36.8 C) (Oral)   Ht 5\' 5"  (1.651 m)   Wt 140 lb 6.4 oz (63.7 kg)   BMI  23.36 kg/m   BSA 1.71 m     More Vitals       Physical Exam   General Appearance:    Alert, cooperative, no distress  Eyes:    PERRL, conjunctiva/corneas clear, EOM's intact       Lungs:     Clear to auscultation bilaterally, respirations unlabored  Heart:    Regular rate and rhythm  Neurologic:   Awake, alert, oriented x 3. No apparent focal neurological           defect.       Activities of Daily Living In your present state of health, do you have any difficulty performing the following activities: 01/15/2018 04/21/2017  Hearing? N N  Vision? N N  Difficulty concentrating or making decisions? N N  Walking or climbing stairs? N N  Dressing or bathing? N N  Doing errands, shopping? N N  Preparing Food and eating ? N -  Using the Toilet? N -  In the past six months, have you accidently leaked urine? N -  Do you have problems with loss of bowel control? N -  Managing your Medications? N -  Managing your Finances? N -  Housekeeping or managing your Housekeeping? N -  Some recent data might be hidden    Fall Risk Assessment Fall Risk  01/15/2018 01/12/2017 01/14/2016 06/08/2015  Falls in the past year? No No No No     Depression Screen PHQ 2/9 Scores 01/15/2018 01/12/2017 01/14/2016 06/08/2015  PHQ - 2 Score 0 0 0 0    Audit-C Alcohol Use Screening   Alcohol Use Disorder Test (AUDIT) 01/15/2018  1. How often do you have a drink containing alcohol? 1  2. How many drinks containing alcohol do you have on a typical day when you are drinking? 0  3. How often do you have six or more drinks on one occasion? 0  AUDIT-C Score 1  Intervention/Follow-up AUDIT Score <7 follow-up not indicated    A score of 3 or more  in women, and 4 or more in men indicates increased risk for alcohol abuse, EXCEPT if all of the points are from question 1  General Appearance:    Alert, cooperative, no distress, appears stated age  Head:    Normocephalic, without obvious abnormality, atraumatic    Eyes:    PERRL, conjunctiva/corneas clear, EOM's intact, fundi    benign, both eyes  Ears:    Normal TM's and external ear canals, both ears  Nose:   Nares normal, septum midline, mucosa normal, no drainage    or sinus tenderness  Throat:   Lips, mucosa, and tongue normal; teeth and gums normal  Neck:   Supple, symmetrical, trachea midline, no adenopathy;    thyroid:  Enlargement/tenderness of lymph nodes; no carotid   bruit or JVD  Back:     Symmetric, no curvature, ROM normal, no CVA tenderness  Lungs:     Clear to auscultation bilaterally, respirations unlabored  Chest Wall:    No tenderness or deformity   Heart:    Regular rate and rhythm, S1 and S2 normal, no murmur, rub   or gallop  Breast Exam:    normal appearance, no masses or tenderness  Abdomen:     Soft, non-tender, bowel sounds active all four quadrants,    no masses, no organomegaly  Pelvic:    deferred  Extremities:   Extremities normal, atraumatic, no cyanosis or edema  Pulses:   2+ and symmetric all extremities  Skin:   Skin color, texture, turgor normal, no rashes or lesions  Lymph nodes:   Cervical, supraclavicular, and axillary nodes normal  Neurologic:   CNII-XII intact, normal strength, sensation and reflexes    throughout    Assessment & Plan:    Annual Physical Reviewed patient's Family Medical History Reviewed and updated list of patient's medical providers Assessment of cognitive impairment was done Assessed patient's functional ability Established a written schedule for health screening services Health Risk Assessent Completed and Reviewed  Exercise Activities and Dietary recommendations Goals    . DIET - INCREASE WATER INTAKE     Recommend increasing water intake to 4 glasses a day.        Immunization History  Administered Date(s) Administered  . HiB (PRP-T) 10/19/2016  . Meningococcal B, OMV 10/19/2016  . Meningococcal Mcv4o 10/19/2016  . Pneumococcal Conjugate-13 10/19/2016  .  Pneumococcal Polysaccharide-23 04/14/2009, 01/15/2018  . Tdap 05/17/2011    Health Maintenance  Topic Date Due  . Hepatitis C Screening  December 18, 1950  . MAMMOGRAM  09/24/2017  . PNA vac Low Risk Adult (2 of 2 - PPSV23) 10/19/2017  . INFLUENZA VACCINE  01/25/2018  . TETANUS/TDAP  05/16/2021  . COLONOSCOPY  05/02/2026  . DEXA SCAN  Completed     Discussed health benefits of physical activity, and encouraged her to engage in regular exercise appropriate for her age and condition.    ------------------------------------------------------------------------------------------------------------  1. Essential hypertension Well controlled.  Continue current medications.   - Lipid panel - Renal function panel - CBC  2. Osteoporosis without current pathological fracture, unspecified osteoporosis type Restart- alendronate (FOSAMAX) 70 MG tablet; Take 1 tablet (70 mg total) by mouth once a week.  Dispense: 12 tablet; Refill: 4 - CBC  3. Vitamin D deficiency Off of supplemental vitamin D - VITAMIN D 25 Hydroxy (Vit-D Deficiency, Fractures)  4. Annual physical exam   5. Post-nasal drainage Use OTC chlorpheniramine, she is susceptible to lower respiratory infections and is to call if cough progresses or worsens.  Lelon Huh, MD  [facility]

## 2018-01-15 ENCOUNTER — Ambulatory Visit (INDEPENDENT_AMBULATORY_CARE_PROVIDER_SITE_OTHER): Payer: Medicare PPO | Admitting: Family Medicine

## 2018-01-15 ENCOUNTER — Ambulatory Visit (INDEPENDENT_AMBULATORY_CARE_PROVIDER_SITE_OTHER): Payer: Medicare PPO

## 2018-01-15 VITALS — BP 140/82

## 2018-01-15 VITALS — BP 134/72 | HR 63 | Temp 98.3°F | Ht 65.0 in | Wt 140.4 lb

## 2018-01-15 DIAGNOSIS — Z0000 Encounter for general adult medical examination without abnormal findings: Secondary | ICD-10-CM | POA: Diagnosis not present

## 2018-01-15 DIAGNOSIS — I10 Essential (primary) hypertension: Secondary | ICD-10-CM

## 2018-01-15 DIAGNOSIS — E559 Vitamin D deficiency, unspecified: Secondary | ICD-10-CM

## 2018-01-15 DIAGNOSIS — Z23 Encounter for immunization: Secondary | ICD-10-CM

## 2018-01-15 DIAGNOSIS — R0982 Postnasal drip: Secondary | ICD-10-CM

## 2018-01-15 DIAGNOSIS — M810 Age-related osteoporosis without current pathological fracture: Secondary | ICD-10-CM

## 2018-01-15 MED ORDER — ALENDRONATE SODIUM 70 MG PO TABS: 70.0000 mg | ORAL_TABLET | ORAL | 4 refills | Status: DC

## 2018-01-15 NOTE — Patient Instructions (Addendum)
The CDC recommends two doses of Shingrix (the shingles vaccine) separated by 2 to 6 months for adults age 69 years and older. I recommend checking with your insurance plan regarding coverage for this vaccine.   . Please call the [facility] ([phone]) to schedule a routine screening mammogram.   

## 2018-01-15 NOTE — Progress Notes (Signed)
Subjective:   Heidi Hernandez is a 67 y.o. female who presents for Medicare Annual (Subsequent) preventive examination.  Review of Systems:  N/A  Cardiac Risk Factors include: advanced age (>4555men, 51>65 women);hypertension     Objective:     Vitals: BP 134/72 (BP Location: Right Arm)   Pulse 63   Temp 98.3 F (36.8 C) (Oral)   Ht 5\' 5"  (1.651 m)   Wt 140 lb 6.4 oz (63.7 kg)   BMI 23.36 kg/m   Body mass index is 23.36 kg/m.  Advanced Directives 01/15/2018 11/22/2017 04/21/2017 04/07/2017 01/12/2017 11/10/2014  Does Patient Have a Medical Advance Directive? No No No No No No  Would patient like information on creating a medical advance directive? No - Patient declined - No - Patient declined No - Patient declined No - Patient declined -    Tobacco Social History   Tobacco Use  Smoking Status Former Smoker  . Packs/day: 1.00  . Years: 10.00  . Pack years: 10.00  . Types: Cigarettes  . Last attempt to quit: 11/10/1979  . Years since quitting: 38.2  Smokeless Tobacco Never Used     Counseling given: Not Answered   Clinical Intake:  Pre-visit preparation completed: Yes  Pain : No/denies pain Pain Score: 0-No pain     Nutritional Status: BMI of 19-24  Normal Nutritional Risks: None Diabetes: No  How often do you need to have someone help you when you read instructions, pamphlets, or other written materials from your doctor or pharmacy?: 1 - Never  Interpreter Needed?: No  Information entered by :: [facility], LPN  Past Medical History:  Diagnosis Date  . Anxiety   . Asthma   . GERD (gastroesophageal reflux disease)   . Headache    Migraines  . Hypertension   . Idiopathic thrombocytopenia purpura (HCC)    Required splenectomy  . MVP (mitral valve prolapse)   . Neuromuscular disorder (HCC)    Restless Leg Syndrome  . [facility]   [place] due to cholecystitis.   . Pneumonia    06-2015   Past Surgical History:  Procedure Laterality  Date  . ABDOMINAL HYSTERECTOMY    . APPENDECTOMY    . CARPAL TUNNEL RELEASE Bilateral 11/19/2014   Procedure: CARPAL TUNNEL RELEASE;  Surgeon: Donato HeinzJames P Hooten, MD;  Location: [facility] ORS;  Service: Orthopedics;  Laterality: Bilateral;  . CESAREAN SECTION     times 2  . CHOLECYSTECTOMY  09/29/2015   [facility]  . COLONOSCOPY N/A 05/02/2016   Procedure: COLONOSCOPY;  Surgeon: Scot Junobert T Elliott, MD;  [facility];  Service: Endoscopy;  Laterality: N/A;  . ESOPHAGOGASTRODUODENOSCOPY (EGD) WITH PROPOFOL N/A 05/02/2016   Procedure: ESOPHAGOGASTRODUODENOSCOPY (EGD) WITH PROPOFOL;  Surgeon: Scot Junobert T Elliott, MD;  [facility];  Service: Endoscopy;  Laterality: N/A;  . ESOPHAGOGASTRODUODENOSCOPY (EGD) WITH PROPOFOL N/A 11/22/2017   Procedure: ESOPHAGOGASTRODUODENOSCOPY (EGD) WITH PROPOFOL;  Surgeon: Scot JunElliott, Robert T, MD;  [facility];  Service: Endoscopy;  Laterality: N/A;  . HERNIA REPAIR     times 3  . JOINT REPLACEMENT    . OVARIAN CYST SURGERY    . SHOULDER ARTHROSCOPY WITH ROTATOR CUFF REPAIR     times 2  . SPLENECTOMY, TOTAL    . TOTAL SHOULDER ARTHROPLASTY Right 04/21/2017   Procedure: REVERSE SHOULDER ARTHROPLASTY;  Surgeon: Signa KellPatel, Sunny, MD;  Location: [facility] ORS;  Service: Orthopedics;  Laterality: Right;  . TRIGGER FINGER RELEASE Right 11/19/2014   Procedure: RELEASE TRIGGER FINGER/A-1 PULLEY;  Surgeon:  Donato Heinz, MD;  Location: [facility] ORS;  Service: Orthopedics;  Laterality: Right;  . TRIGGER FINGER RELEASE Right 08/12/2015   Procedure: RELEASE TRIGGER LONG FINGER;  Surgeon: Donato Heinz, MD;  Location: [facility] ORS;  Service: Orthopedics;  Laterality: Right;   Family History  Problem Relation Age of Onset  . Hypertension Mother   . Renal Disease Father        ESRD with HD   Social History   Socioeconomic History  . Marital status: Married    Spouse name: Not on file  . Number of children: 2  . Years of education: Not on file  . Highest education level:  Bachelor's degree (e.g., BA, AB, BS)  Occupational History  . Occupation: Retired  Engineer, production  . Financial resource strain: Not very hard  . Food insecurity:    Worry: Never true    Inability: Never true  . Transportation needs:    Medical: No    Non-medical: No  Tobacco Use  . Smoking status: Former Smoker    Packs/day: 1.00    Years: 10.00    Pack years: 10.00    Types: Cigarettes    Last attempt to quit: 11/10/1979    Years since quitting: 38.2  . Smokeless tobacco: Never Used  Substance and Sexual Activity  . Alcohol use: Yes    Alcohol/week: 0.0 oz    Comment: occasional- wine  . Drug use: No  . Sexual activity: Never  Lifestyle  . Physical activity:    Days per week: Not on file    Minutes per session: Not on file  . Stress: To some extent  Relationships  . Social connections:    Talks on phone: Not on file    Gets together: Not on file    Attends religious service: Not on file    Active member of club or organization: Not on file    Attends meetings of clubs or organizations: Not on file    Relationship status: Not on file  Other Topics Concern  . Not on file  Social History Narrative  . Not on file    Outpatient Encounter Medications as of 01/15/2018  Medication Sig  . acetaminophen (TYLENOL) 500 MG tablet Take 2 tablets (1,000 mg total) by mouth every 8 (eight) hours.  Marland Kitchen albuterol (VENTOLIN HFA) 108 (90 Base) MCG/ACT inhaler Inhale 2 puffs into the lungs every 6 (six) hours as needed for wheezing. May substitute generic or equivalent brand name  . amLODipine (NORVASC) 2.5 MG tablet Take 1 tablet (2.5 mg total) by mouth daily.  Marland Kitchen atenolol (TENORMIN) 50 MG tablet TAKE 1 TABLET BY MOUTH ONCE DAILY (Patient taking differently: Take 50 mg by mouth daily)  . azelastine (ASTELIN) 0.1 % nasal spray Place 2 sprays into both nostrils daily.   . fluticasone (FLONASE) 50 MCG/ACT nasal spray Place 2 sprays into both nostrils daily.   . fluticasone (FLOVENT HFA) 44  MCG/ACT inhaler Inhale 1 puff into the lungs daily.  Marland Kitchen LORazepam (ATIVAN) 1 MG tablet Take 0.5 tablets (0.5 mg total) by mouth every 6 (six) hours as needed for anxiety.  . montelukast (SINGULAIR) 10 MG tablet Take 10 mg by mouth at bedtime.   Marland Kitchen omeprazole (PRILOSEC) 40 MG capsule Take 40 mg by mouth daily.  . pramipexole (MIRAPEX) 1 MG tablet TAKE 1 TABLET BY MOUTH TWICE DAILY  . SUMAtriptan (IMITREX) 100 MG tablet TAKE 1 AT ONSET OF HEADACHE.  MAY TAKE AN ADDITIONAL TABLET IN 2 HOURS  IF NEEEDED BUT ONLY 2 TABLETS IN 24 HOURS (Patient taking differently: TAKE 100 MG AT ONSET OF HEADACHE.  MAY TAKE AN ADDITIONAL TABLET IN 2 HOURS IF NEEEDED BUT ONLY 2 TABLETS IN 24 HOURS)  . alendronate (FOSAMAX) 70 MG tablet Take 1 tablet (70 mg total) by mouth once a week. (Patient not taking: Reported on 01/15/2018)  . aspirin 325 MG EC tablet Take 1 tablet (325 mg total) by mouth daily. (Patient not taking: Reported on 01/15/2018)  . chlorpheniramine-HYDROcodone (TUSSIONEX PENNKINETIC ER) 10-8 MG/5ML SUER Take 5 mLs by mouth 2 (two) times daily. (Patient not taking: Reported on 01/15/2018)  . cholestyramine (QUESTRAN) 4 GM/DOSE powder Take 1 packet (4 g total) by mouth 2 (two) times daily with a meal. (Patient not taking: Reported on 01/15/2018)  . doxycycline (VIBRA-TABS) 100 MG tablet Take 1 tablet (100 mg total) by mouth 2 (two) times daily. (Patient not taking: Reported on 01/15/2018)  . Fexofenadine HCl (ALLEGRA PO) Take 1 capsule by mouth daily.  . mupirocin cream (BACTROBAN) 2 % Apply 1 application topically 2 (two) times daily. (Patient not taking: Reported on 01/15/2018)  . RABEprazole (ACIPHEX) 20 MG tablet Take 20 mg by mouth 2 (two) times daily.   . sucralfate (CARAFATE) 1 g tablet Take 1 g by mouth 2 (two) times daily.   Marland Kitchen triamcinolone ointment (KENALOG) 0.5 % Apply 1 application topically 2 (two) times daily. (Patient not taking: Reported on 01/15/2018)   No facility-administered encounter medications on  file as of 01/15/2018.     Activities of Daily Living In your present state of health, do you have any difficulty performing the following activities: 01/15/2018 04/21/2017  Hearing? N N  Vision? N N  Difficulty concentrating or making decisions? N N  Walking or climbing stairs? N N  Dressing or bathing? N N  Doing errands, shopping? N N  Preparing Food and eating ? N -  Using the Toilet? N -  In the past six months, have you accidently leaked urine? N -  Do you have problems with loss of bowel control? N -  Managing your Medications? N -  Managing your Finances? N -  Housekeeping or managing your Housekeeping? N -  Some recent data might be hidden    Patient Care Team: Malva Limes, MD as PCP - General (Family Medicine) Scot Jun, MD as Consulting Physician (Gastroenterology) Signa Kell, MD as Consulting Physician (Orthopedic Surgery) Eileen Stanford, MD as Referring Physician (Allergy and Immunology) Dasher, Cliffton Asters, MD as Consulting Physician (Dermatology) Sallee Lange, MD as Consulting Physician (Ophthalmology)    Assessment:   This is a routine wellness examination for Arnetha.  Exercise Activities and Dietary recommendations Current Exercise Habits: Home exercise routine, Type of exercise: Other - see comments;walking(rides a bicycle), Time (Minutes): 15, Frequency (Times/Week): 7, Weekly Exercise (Minutes/Week): 105, Intensity: Mild, Exercise limited by: None identified  Goals    . DIET - INCREASE WATER INTAKE     Recommend increasing water intake to 4 glasses a day.        Fall Risk Fall Risk  01/15/2018 01/12/2017 01/14/2016 06/08/2015  Falls in the past year? No No No No   Is the patient's home free of loose throw rugs in walkways, pet beds, electrical cords, etc?   yes      Grab bars in the bathroom? no      Handrails on the stairs?   yes      Adequate lighting?   yes  Timed  Get Up and Go performed: N/A  Depression Screen PHQ 2/9 Scores  01/15/2018 01/12/2017 01/14/2016 06/08/2015  PHQ - 2 Score 0 0 0 0     Cognitive Function: Pt declined screening today.      6CIT Screen 01/12/2017 01/12/2017  What Year? 0 points 0 points  What month? 0 points 0 points  What time? 0 points 0 points  Count back from 20 0 points 0 points  Months in reverse 0 points -  Repeat phrase 2 points -  Total Score 2 -    Immunization History  Administered Date(s) Administered  . HiB (PRP-T) 10/19/2016  . Meningococcal B, OMV 10/19/2016  . Meningococcal Mcv4o 10/19/2016  . Pneumococcal Conjugate-13 10/19/2016  . Pneumococcal Polysaccharide-23 04/14/2009, 01/15/2018  . Tdap 05/17/2011    Qualifies for Shingles Vaccine? Due for Shingles vaccine. Declined my offer to administer today. Education has been provided regarding the importance of this vaccine. Pt has been advised to call her insurance company to determine her out of pocket expense. Advised she may also receive this vaccine at her local pharmacy or Health Dept. Verbalized acceptance and understanding.  Screening Tests Health Maintenance  Topic Date Due  . Hepatitis C Screening  02/19/1951  . MAMMOGRAM  09/24/2017  . PNA vac Low Risk Adult (2 of 2 - PPSV23) 10/19/2017  . INFLUENZA VACCINE  01/25/2018  . TETANUS/TDAP  05/16/2021  . COLONOSCOPY  05/02/2026  . DEXA SCAN  Completed    Cancer Screenings: Lung: Low Dose CT Chest recommended if Age 52-80 years, 30 pack-year currently smoking OR have quit w/in 15years. Patient does not qualify. Breast:  Up to date on Mammogram? No, pt plans to set this up this year.  Up to date of Bone Density/Dexa? Yes Colorectal: Up to date  Additional Screenings:  Hepatitis C Screening: Pt declines today.      Plan:  I have personally reviewed and addressed the Medicare Annual Wellness questionnaire and have noted the following in the patient's chart:  A. Medical and social history B. Use of alcohol, tobacco or illicit drugs  C. Current  medications and supplements D. Functional ability and status E.  Nutritional status F.  Physical activity G. Advance directives H. List of other physicians I.  Hospitalizations, surgeries, and ER visits in previous 12 months J.  Vitals K. Screenings such as hearing and vision if needed, cognitive and depression L. Referrals and appointments - none  In addition, I have reviewed and discussed with patient certain preventive protocols, quality metrics, and best practice recommendations. A written personalized care plan for preventive services as well as general preventive health recommendations were provided to patient.  See attached scanned questionnaire for additional information.   Signed,  Hyacinth Meeker, LPN Nurse Health Advisor   Nurse Recommendations: Pt plans to set up a mammogram this year. Pt declined the Hep C lab today.

## 2018-01-15 NOTE — Patient Instructions (Signed)
[name] , Thank you for taking time to come for your Medicare Wellness Visit. I appreciate your ongoing commitment to your health goals. Please review the following plan we discussed and let me know if I can assist you in the future.   Screening recommendations/referrals: Colonoscopy: Up to date Mammogram: Pt plans to set up this year.  Bone Density: Up to date Recommended yearly ophthalmology/optometry visit for glaucoma screening and checkup Recommended yearly dental visit for hygiene and checkup  Vaccinations: Influenza vaccine: N/A Pneumococcal vaccine: Up to date Tdap vaccine: Up to date Shingles vaccine: Pt declines today.     Advanced directives: Advance directive discussed with you today. Even though you declined this today please call our office should you change your mind and we can give you the proper paperwork for you to fill out.  Conditions/risks identified: Recommend increasing water intake to 4 glasses a day.   Next appointment: 10:00 AM today with Dr Heidi Hernandez.    Preventive Care 7365 Years and Older, Female Preventive care refers to lifestyle choices and visits with your health care provider that can promote health and wellness. What does preventive care include?  A yearly physical exam. This is also called an annual well check.  Dental exams once or twice a year.  Routine eye exams. Ask your health care provider how often you should have your eyes checked.  Personal lifestyle choices, including:  Daily care of your teeth and gums.  Regular physical activity.  Eating a healthy diet.  Avoiding tobacco and drug use.  Limiting alcohol use.  Practicing safe sex.  Taking low-dose aspirin every day.  Taking vitamin and mineral supplements as recommended by your health care provider. What happens during an annual well check? The services and screenings done by your health care provider during your annual well check will depend on your age, overall health,  lifestyle risk factors, and family history of disease. Counseling  Your health care provider may ask you questions about your:  Alcohol use.  Tobacco use.  Drug use.  Emotional well-being.  Home and relationship well-being.  Sexual activity.  Eating habits.  History of falls.  Memory and ability to understand (cognition).  Work and work Astronomerenvironment.  Reproductive health. Screening  You may have the following tests or measurements:  Height, weight, and BMI.  Blood pressure.  Lipid and cholesterol levels. These may be checked every 5 years, or more frequently if you are over 69 years old.  Skin check.  Lung cancer screening. You may have this screening every year starting at age 69 if you have a 30-pack-year history of smoking and currently smoke or have quit within the past 15 years.  Fecal occult blood test (FOBT) of the stool. You may have this test every year starting at age 69.  Flexible sigmoidoscopy or colonoscopy. You may have a sigmoidoscopy every 5 years or a colonoscopy every 10 years starting at age 69.  Hepatitis C blood test.  Hepatitis B blood test.  Sexually transmitted disease (STD) testing.  Diabetes screening. This is done by checking your blood sugar (glucose) after you have not eaten for a while (fasting). You may have this done every 1-3 years.  Bone density scan. This is done to screen for osteoporosis. You may have this done starting at age 665.  Mammogram. This may be done every 1-2 years. Talk to your health care provider about how often you should have regular mammograms. Talk with your health care provider about your test  results, treatment options, and if necessary, the need for more tests. Vaccines  Your health care provider may recommend certain vaccines, such as:  Influenza vaccine. This is recommended every year.  Tetanus, diphtheria, and acellular pertussis (Tdap, Td) vaccine. You may need a Td booster every 10 years.  Zoster  vaccine. You may need this after age 28.  Pneumococcal 13-valent conjugate (PCV13) vaccine. One dose is recommended after age 89.  Pneumococcal polysaccharide (PPSV23) vaccine. One dose is recommended after age 90. Talk to your health care provider about which screenings and vaccines you need and how often you need them. This information is not intended to replace advice given to you by your health care provider. Make sure you discuss any questions you have with your health care provider. Document Released: 07/10/2015 Document Revised: 03/02/2016 Document Reviewed: 04/14/2015 Elsevier Interactive Patient Education  2017 [place] Prevention in the Home Falls can cause injuries. They can happen to people of all ages. There are many things you can do to make your home safe and to help prevent falls. What can I do on the outside of my home?  Regularly fix the edges of walkways and driveways and fix any cracks.  Remove anything that might make you trip as you walk through a door, such as a raised step or threshold.  Trim any bushes or trees on the path to your home.  Use bright outdoor lighting.  Clear any walking paths of anything that might make someone trip, such as rocks or tools.  Regularly check to see if handrails are loose or broken. Make sure that both sides of any steps have handrails.  Any raised decks and porches should have guardrails on the edges.  Have any leaves, snow, or ice cleared regularly.  Use sand or salt on walking paths during winter.  Clean up any spills in your garage right away. This includes oil or grease spills. What can I do in the bathroom?  Use night lights.  Install grab bars by the toilet and in the tub and shower. Do not use towel bars as grab bars.  Use non-skid mats or decals in the tub or shower.  If you need to sit down in the shower, use a plastic, non-slip stool.  Keep the floor dry. Clean up any water that spills on the  floor as soon as it happens.  Remove soap buildup in the tub or shower regularly.  Attach bath mats securely with double-sided non-slip rug tape.  Do not have throw rugs and other things on the floor that can make you trip. What can I do in the bedroom?  Use night lights.  Make sure that you have a light by your bed that is easy to reach.  Do not use any sheets or blankets that are too big for your bed. They should not hang down onto the floor.  Have a firm chair that has side arms. You can use this for support while you get dressed.  Do not have throw rugs and other things on the floor that can make you trip. What can I do in the kitchen?  Clean up any spills right away.  Avoid walking on wet floors.  Keep items that you use a lot in easy-to-reach places.  If you need to reach something above you, use a strong step stool that has a grab bar.  Keep electrical cords out of the way.  Do not use floor polish or wax that makes  floors slippery. If you must use wax, use non-skid floor wax.  Do not have throw rugs and other things on the floor that can make you trip. What can I do with my stairs?  Do not leave any items on the stairs.  Make sure that there are handrails on both sides of the stairs and use them. Fix handrails that are broken or loose. Make sure that handrails are as long as the stairways.  Check any carpeting to make sure that it is firmly attached to the stairs. Fix any carpet that is loose or worn.  Avoid having throw rugs at the top or bottom of the stairs. If you do have throw rugs, attach them to the floor with carpet tape.  Make sure that you have a light switch at the top of the stairs and the bottom of the stairs. If you do not have them, ask someone to add them for you. What else can I do to help prevent falls?  Wear shoes that:  Do not have high heels.  Have rubber bottoms.  Are comfortable and fit you well.  Are closed at the toe. Do not wear  sandals.  If you use a stepladder:  Make sure that it is fully opened. Do not climb a closed stepladder.  Make sure that both sides of the stepladder are locked into place.  Ask someone to hold it for you, if possible.  Clearly mark and make sure that you can see:  Any grab bars or handrails.  First and last steps.  Where the edge of each step is.  Use tools that help you move around (mobility aids) if they are needed. These include:  Canes.  Walkers.  Scooters.  Crutches.  Turn on the lights when you go into a dark area. Replace any light bulbs as soon as they burn out.  Set up your furniture so you have a clear path. Avoid moving your furniture around.  If any of your floors are uneven, fix them.  If there are any pets around you, be aware of where they are.  Review your medicines with your doctor. Some medicines can make you feel dizzy. This can increase your chance of falling. Ask your doctor what other things that you can do to help prevent falls. This information is not intended to replace advice given to you by your health care provider. Make sure you discuss any questions you have with your health care provider. Document Released: 04/09/2009 Document Revised: 11/19/2015 Document Reviewed: 07/18/2014 Elsevier Interactive Patient Education  2017 [employer].

## 2018-01-16 ENCOUNTER — Other Ambulatory Visit: Payer: Self-pay | Admitting: Family Medicine

## 2018-01-16 ENCOUNTER — Telehealth: Payer: Self-pay

## 2018-01-16 LAB — RENAL FUNCTION PANEL
Albumin: 4.4 g/dL (ref 3.6–4.8)
BUN/Creatinine Ratio: 23 (ref 12–28)
BUN: 16 mg/dL (ref 8–27)
CO2: 22 mmol/L (ref 20–29)
CREATININE: 0.71 mg/dL (ref 0.57–1.00)
Calcium: 9.5 mg/dL (ref 8.7–10.3)
Chloride: 107 mmol/L — ABNORMAL HIGH (ref 96–106)
GFR calc non Af Amer: 87 mL/min/{1.73_m2} (ref >59)
GFR, EST AFRICAN AMERICAN: 100 mL/min/{1.73_m2} (ref >59)
Glucose: 101 mg/dL — ABNORMAL HIGH (ref 65–99)
Phosphorus: 3.6 mg/dL (ref 2.5–4.5)
Potassium: 4.3 mmol/L (ref 3.5–5.2)
Sodium: 143 mmol/L (ref 134–144)

## 2018-01-16 LAB — CBC
HEMATOCRIT: 41.3 % (ref 34.0–46.6)
HEMOGLOBIN: 14.4 g/dL (ref 11.1–15.9)
MCH: 33.4 pg — AB (ref 26.6–33.0)
MCHC: 34.9 g/dL (ref 31.5–35.7)
MCV: 96 fL (ref 79–97)
Platelets: 174 10*3/uL (ref 150–450)
RBC: 4.31 x10E6/uL (ref 3.77–5.28)
RDW: 14.5 % (ref 12.3–15.4)
WBC: 8.1 10*3/uL (ref 3.4–10.8)

## 2018-01-16 LAB — LIPID PANEL
CHOL/HDL RATIO: 2.6 ratio (ref 0.0–4.4)
Cholesterol, Total: 155 mg/dL (ref 100–199)
HDL: 60 mg/dL (ref >39)
LDL CALC: 75 mg/dL (ref 0–99)
TRIGLYCERIDES: 100 mg/dL (ref 0–149)
VLDL Cholesterol Cal: 20 mg/dL (ref 5–40)

## 2018-01-16 LAB — VITAMIN D 25 HYDROXY (VIT D DEFICIENCY, FRACTURES): Vit D, 25-Hydroxy: 30.6 ng/mL (ref 30.0–100.0)

## 2018-01-16 MED ORDER — LEVOFLOXACIN 750 MG PO TABS: 750.0000 mg | ORAL_TABLET | Freq: Every day | ORAL | 0 refills | Status: AC

## 2018-01-16 MED ORDER — VITAMIN D (ERGOCALCIFEROL) 1.25 MG (50000 UNIT) PO CAPS: 50000.0000 [IU] | ORAL_CAPSULE | ORAL | 4 refills | Status: DC

## 2018-01-16 NOTE — Telephone Encounter (Signed)
Have sent prescription for levofloxacin to [facility].

## 2018-01-16 NOTE — Telephone Encounter (Signed)
Patient states she prefers to proceed with antibiotic treatment. She reports she is now coughing and congested. She would like the RX sent to [facility]. CB# [phone].

## 2018-01-16 NOTE — Telephone Encounter (Signed)
Patient called back to follow up on RX request. She states she now has a fever 100.0.

## 2018-01-16 NOTE — Telephone Encounter (Signed)
Patient advised.

## 2018-01-26 ENCOUNTER — Other Ambulatory Visit: Payer: Self-pay | Admitting: Family Medicine

## 2018-02-01 ENCOUNTER — Other Ambulatory Visit: Payer: Self-pay | Admitting: Orthopedic Surgery

## 2018-02-01 DIAGNOSIS — G8929 Other chronic pain: Secondary | ICD-10-CM

## 2018-02-01 DIAGNOSIS — M25511 Pain in right shoulder: Principal | ICD-10-CM

## 2018-02-07 ENCOUNTER — Ambulatory Visit
Admission: RE | Admit: 2018-02-07 | Discharge: 2018-02-07 | Disposition: A | Discharge status: Home / Self Care | Payer: Medicare PPO | Source: Ambulatory Visit | Attending: Orthopedic Surgery | Admitting: Orthopedic Surgery

## 2018-02-07 DIAGNOSIS — I700 Atherosclerosis of aorta: Secondary | ICD-10-CM | POA: Insufficient documentation

## 2018-02-07 DIAGNOSIS — S42131A Displaced fracture of coracoid process, right shoulder, initial encounter for closed fracture: Secondary | ICD-10-CM | POA: Insufficient documentation

## 2018-02-07 DIAGNOSIS — Z96611 Presence of right artificial shoulder joint: Secondary | ICD-10-CM | POA: Insufficient documentation

## 2018-02-07 DIAGNOSIS — X58XXXA Exposure to other specified factors, initial encounter: Secondary | ICD-10-CM | POA: Diagnosis not present

## 2018-02-07 DIAGNOSIS — M25511 Pain in right shoulder: Secondary | ICD-10-CM | POA: Diagnosis not present

## 2018-02-07 DIAGNOSIS — G8929 Other chronic pain: Secondary | ICD-10-CM | POA: Diagnosis present

## 2018-02-07 DIAGNOSIS — M62521 Muscle wasting and atrophy, not elsewhere classified, right upper arm: Secondary | ICD-10-CM | POA: Diagnosis not present

## 2018-03-12 ENCOUNTER — Other Ambulatory Visit: Payer: Self-pay | Admitting: Family Medicine

## 2018-04-19 ENCOUNTER — Ambulatory Visit (INDEPENDENT_AMBULATORY_CARE_PROVIDER_SITE_OTHER): Payer: Medicare PPO | Admitting: Physician Assistant

## 2018-04-19 ENCOUNTER — Encounter: Payer: Self-pay | Admitting: Physician Assistant

## 2018-04-19 VITALS — BP 150/90 | HR 74 | Temp 98.7°F | Resp 16 | Wt 142.0 lb

## 2018-04-19 DIAGNOSIS — R300 Dysuria: Secondary | ICD-10-CM

## 2018-04-19 DIAGNOSIS — R05 Cough: Secondary | ICD-10-CM

## 2018-04-19 DIAGNOSIS — R059 Cough, unspecified: Secondary | ICD-10-CM

## 2018-04-19 LAB — POCT URINALYSIS DIPSTICK
Bilirubin, UA: NEGATIVE
Glucose, UA: NEGATIVE
Ketones, UA: NEGATIVE
Nitrite, UA: NEGATIVE
Protein, UA: POSITIVE — AB
Spec Grav, UA: 1.01 (ref 1.010–1.025)
Urobilinogen, UA: 0.2 E.U./dL
pH, UA: 6 (ref 5.0–8.0)

## 2018-04-19 MED ORDER — DOXYCYCLINE HYCLATE 100 MG PO TABS: 100.0000 mg | ORAL_TABLET | Freq: Two times a day (BID) | ORAL | 0 refills | Status: AC

## 2018-04-19 NOTE — Patient Instructions (Signed)

## 2018-04-19 NOTE — Progress Notes (Signed)
Patient: Heidi Hernandez Female    DOB: [birth year]   67 y.o.   MRN: [id number] Visit Date: 04/19/2018  Today's Provider: Trey Sailors, PA-C   Chief Complaint  Patient presents with  . Urinary Tract Infection  . URI   Subjective:    HPI Urinary Tract Infection: Patient complains of burning with urination, dysuria and frequency She has had symptoms for 2 days. Patient also complains of bad odor. Patient denies back pain, fever, stomach ache and vaginal discharge. Patient does not have a history of recurrent UTI.  Patient does not have a history of pyelonephritis.   Upper Respiratory Infection: Patient complains of symptoms of a URI. Symptoms include cough. Onset of symptoms was 2 days ago, gradually worsening since that time. She also c/o congestion, non productive cough and sore throat for the past 1 days .  She is drinking plenty of fluids. Evaluation to date: none. Treatment to date: antihistamines.       Allergies  Allergen Reactions  . Augmentin [Amoxicillin-Pot Clavulanate] Nausea And Vomiting  . Droperidol Other (See Comments)    "Locked jaw" Facial muscles locked "Locked jaw"  . Paroxetine Hcl Other (See Comments)    syncope Passed out   . Morphine Itching     Current Outpatient Medications:  .  acetaminophen (TYLENOL) 500 MG tablet, Take 2 tablets (1,000 mg total) by mouth every 8 (eight) hours., Disp: 90 tablet, Rfl: 2 .  albuterol (VENTOLIN HFA) 108 (90 Base) MCG/ACT inhaler, Inhale 2 puffs into the lungs every 6 (six) hours as needed for wheezing. May substitute generic or equivalent brand name, Disp: 18 g, Rfl: 1 .  alendronate (FOSAMAX) 70 MG tablet, Take 1 tablet (70 mg total) by mouth once a week., Disp: 12 tablet, Rfl: 4 .  amLODipine (NORVASC) 2.5 MG tablet, Take 1 tablet (2.5 mg total) by mouth daily., Disp: 90 tablet, Rfl: 3 .  atenolol (TENORMIN) 50 MG tablet, TAKE 1 TABLET BY MOUTH ONCE DAILY, Disp: 30 tablet, Rfl: 12 .  azelastine (ASTELIN)  0.1 % nasal spray, Place 2 sprays into both nostrils daily. , Disp: , Rfl:  .  Fexofenadine HCl (ALLEGRA PO), Take 1 capsule by mouth daily., Disp: , Rfl:  .  fluticasone (FLONASE) 50 MCG/ACT nasal spray, Place 2 sprays into both nostrils daily. , Disp: , Rfl:  .  fluticasone (FLOVENT HFA) 44 MCG/ACT inhaler, Inhale 1 puff into the lungs daily., Disp: , Rfl:  .  LORazepam (ATIVAN) 1 MG tablet, Take 0.5 tablets (0.5 mg total) by mouth every 6 (six) hours as needed for anxiety., Disp: 15 tablet, Rfl: 4 .  montelukast (SINGULAIR) 10 MG tablet, Take 10 mg by mouth at bedtime. , Disp: , Rfl:  .  omeprazole (PRILOSEC) 40 MG capsule, Take 40 mg by mouth daily., Disp: , Rfl:  .  pramipexole (MIRAPEX) 1 MG tablet, TAKE 1 TABLET BY MOUTH TWICE DAILY, Disp: 180 tablet, Rfl: 3 .  RABEprazole (ACIPHEX) 20 MG tablet, Take 20 mg by mouth 2 (two) times daily. , Disp: , Rfl:  .  SUMAtriptan (IMITREX) 100 MG tablet, TAKE 1 TABLET AT ONSET OF HEADACHE,MAY TAKE AN ADDITIONAL TABLET IN 2HRS IF NEEDED BUT MAX 2 TABLETS PER 24 HRS, Disp: 9 tablet, Rfl: 5 .  Vitamin D, Ergocalciferol, (DRISDOL) 50000 units CAPS capsule, Take 1 capsule (50,000 Units total) by mouth every 7 (seven) days. Reported on 01/14/2016, Disp: 12 capsule, Rfl: 4 .  cholestyramine (QUESTRAN) 4 GM/DOSE  powder, Take 1 packet (4 g total) by mouth 2 (two) times daily with a meal. (Patient not taking: Reported on 01/15/2018), Disp: 378 g, Rfl: 12 .  sucralfate (CARAFATE) 1 g tablet, Take 1 g by mouth 2 (two) times daily. , Disp: , Rfl:  .  triamcinolone ointment (KENALOG) 0.5 %, Apply 1 application topically 2 (two) times daily. (Patient not taking: Reported on 01/15/2018), Disp: 30 g, Rfl: 0  Review of Systems  HENT: Positive for congestion, postnasal drip and sore throat.   Respiratory: Positive for cough.   Genitourinary: Positive for dysuria.    Social History   Tobacco Use  . Smoking status: Former Smoker    Packs/day: 1.00    Years: 10.00     Pack years: 10.00    Types: Cigarettes    Last attempt to quit: 11/10/1979    Years since quitting: 38.4  . Smokeless tobacco: Never Used  Substance Use Topics  . Alcohol use: Yes    Alcohol/week: 0.0 standard drinks    Comment: occasional- wine   Objective:   BP (!) 150/90 (BP Location: Left Arm, Patient Position: Sitting, Cuff Size: Normal)   Pulse 74   Temp 98.7 F (37.1 C) (Oral)   Resp 16   Wt 142 lb (64.4 kg)   SpO2 97%   BMI 23.63 kg/m  Vitals:   04/19/18 1116  BP: (!) 150/90  Pulse: 74  Resp: 16  Temp: 98.7 F (37.1 C)  TempSrc: Oral  SpO2: 97%  Weight: 142 lb (64.4 kg)     Physical Exam  Constitutional: She is oriented to person, place, and time. She appears well-developed and well-nourished.  Cardiovascular: Normal rate and regular rhythm.  Pulmonary/Chest: Effort normal and breath sounds normal.  Abdominal: Soft. Bowel sounds are normal. There is no CVA tenderness.  Neurological: She is alert and oriented to person, place, and time.  Skin: Skin is warm and dry.  Psychiatric: She has a normal mood and affect. Her behavior is normal.        Assessment & Plan:     1. Dysuria  White blood cells and hemolyzed blood on UA. Will treat as below. - POCT urinalysis dipstick - doxycycline (VIBRA-TABS) 100 MG tablet; Take 1 tablet (100 mg total) by mouth 2 (two) times daily for 7 days.  Dispense: 14 tablet; Refill: 0 - Urine Culture  2. Cough  Think this is a cough due to likely allergies or virus. Patient with history of asplenia, frequently gets antibiotics for similar symptoms and is requesting this today.   - doxycycline (VIBRA-TABS) 100 MG tablet; Take 1 tablet (100 mg total) by mouth 2 (two) times daily for 7 days.  Dispense: 14 tablet; Refill: 0  Return if symptoms worsen or fail to improve.  The entirety of the information documented in the History of Present Illness, Review of Systems and Physical Exam were personally obtained by me. Portions of  this information were initially documented by Rondel Baton, CMA and reviewed by me for thoroughness and accuracy.        Trey Sailors, PA-C  [facility]

## 2018-04-23 ENCOUNTER — Other Ambulatory Visit: Payer: Self-pay

## 2018-04-23 DIAGNOSIS — N3090 Cystitis, unspecified without hematuria: Secondary | ICD-10-CM

## 2018-04-23 LAB — URINE CULTURE

## 2018-04-23 MED ORDER — AMOXICILLIN 875 MG PO TABS: 875.0000 mg | ORAL_TABLET | Freq: Two times a day (BID) | ORAL | 0 refills | Status: AC

## 2018-04-23 NOTE — Telephone Encounter (Signed)
-----   Message from Trey Sailors, [place] sent at 04/23/2018  2:54 PM EDT ----- Urine culture returned with organism that is resistant to doxycycline prescribe. She has nausea and vomiting listed to augmentin, but how about amoxicillin? This can be easier on the stomach and will cover her UTI.

## 2018-04-23 NOTE — Telephone Encounter (Signed)
Patient advised as below. Patient verbalizes understanding and is in agreement with treatment plan.  

## 2018-05-18 ENCOUNTER — Ambulatory Visit (INDEPENDENT_AMBULATORY_CARE_PROVIDER_SITE_OTHER): Payer: Medicare PPO | Admitting: Family Medicine

## 2018-05-18 ENCOUNTER — Encounter: Payer: Self-pay | Admitting: Family Medicine

## 2018-05-18 VITALS — BP 160/90 | HR 84 | Temp 98.8°F | Resp 18 | Wt 146.0 lb

## 2018-05-18 DIAGNOSIS — Z23 Encounter for immunization: Secondary | ICD-10-CM | POA: Diagnosis not present

## 2018-05-18 DIAGNOSIS — J329 Chronic sinusitis, unspecified: Secondary | ICD-10-CM | POA: Diagnosis not present

## 2018-05-18 DIAGNOSIS — R05 Cough: Secondary | ICD-10-CM

## 2018-05-18 DIAGNOSIS — R059 Cough, unspecified: Secondary | ICD-10-CM

## 2018-05-18 DIAGNOSIS — J069 Acute upper respiratory infection, unspecified: Secondary | ICD-10-CM

## 2018-05-18 LAB — POCT INFLUENZA A/B
Influenza A, POC: NEGATIVE
Influenza B, POC: NEGATIVE

## 2018-05-18 MED ORDER — AMOXICILLIN 500 MG PO CAPS: 1000.0000 mg | ORAL_CAPSULE | Freq: Two times a day (BID) | ORAL | 0 refills | Status: AC

## 2018-05-18 NOTE — Progress Notes (Signed)
Patient: Heidi PaoCarol M Hernandez Female    DOB: 11/30/1950   67 y.o.   MRN: [id number] Visit Date: 05/18/2018  Today's Provider: Mila Merryonald , MD   Chief Complaint  Patient presents with  . Cough    x 1 day   Subjective:    Cough  This is a new problem. The current episode started yesterday. The problem has been gradually worsening. The cough is productive of sputum (clear colored). Associated symptoms include chest pain, ear congestion (left ear), a fever (low grade 99), headaches, nasal congestion, postnasal drip and a sore throat. Pertinent negatives include no chills, ear pain, hemoptysis, rhinorrhea, shortness of breath or wheezing. Treatments tried: Careers adviserAllegra. The treatment provided no relief.       Allergies  Allergen Reactions  . Augmentin [Amoxicillin-Pot Clavulanate] Nausea And Vomiting  . Droperidol Other (See Comments)    "Locked jaw" Facial muscles locked "Locked jaw"  . Paroxetine Hcl Other (See Comments)    syncope Passed out   . Morphine Itching     Current Outpatient Medications:  .  albuterol (VENTOLIN HFA) 108 (90 Base) MCG/ACT inhaler, Inhale 2 puffs into the lungs every 6 (six) hours as needed for wheezing. May substitute generic or equivalent brand name, Disp: 18 g, Rfl: 1 .  alendronate (FOSAMAX) 70 MG tablet, Take 1 tablet (70 mg total) by mouth once a week., Disp: 12 tablet, Rfl: 4 .  amLODipine (NORVASC) 2.5 MG tablet, Take 1 tablet (2.5 mg total) by mouth daily., Disp: 90 tablet, Rfl: 3 .  atenolol (TENORMIN) 50 MG tablet, TAKE 1 TABLET BY MOUTH ONCE DAILY, Disp: 30 tablet, Rfl: 12 .  azelastine (ASTELIN) 0.1 % nasal spray, Place 2 sprays into both nostrils daily. , Disp: , Rfl:  .  cholestyramine (QUESTRAN) 4 GM/DOSE powder, Take 1 packet (4 g total) by mouth 2 (two) times daily with a meal., Disp: 378 g, Rfl: 12 .  Fexofenadine HCl (ALLEGRA PO), Take 1 capsule by mouth daily., Disp: , Rfl:  .  fluticasone (FLONASE) 50 MCG/ACT nasal spray, Place 2  sprays into both nostrils daily. , Disp: , Rfl:  .  fluticasone (FLOVENT HFA) 44 MCG/ACT inhaler, Inhale 1 puff into the lungs daily., Disp: , Rfl:  .  LORazepam (ATIVAN) 1 MG tablet, Take 0.5 tablets (0.5 mg total) by mouth every 6 (six) hours as needed for anxiety., Disp: 15 tablet, Rfl: 4 .  montelukast (SINGULAIR) 10 MG tablet, Take 10 mg by mouth at bedtime. , Disp: , Rfl:  .  omeprazole (PRILOSEC) 40 MG capsule, Take 40 mg by mouth daily., Disp: , Rfl:  .  pramipexole (MIRAPEX) 1 MG tablet, TAKE 1 TABLET BY MOUTH TWICE DAILY, Disp: 180 tablet, Rfl: 3 .  RABEprazole (ACIPHEX) 20 MG tablet, Take 20 mg by mouth 2 (two) times daily. , Disp: , Rfl:  .  sucralfate (CARAFATE) 1 g tablet, Take 1 g by mouth 2 (two) times daily. , Disp: , Rfl:  .  SUMAtriptan (IMITREX) 100 MG tablet, TAKE 1 TABLET AT ONSET OF HEADACHE,MAY TAKE AN ADDITIONAL TABLET IN 2HRS IF NEEDED BUT MAX 2 TABLETS PER 24 HRS, Disp: 9 tablet, Rfl: 5 .  triamcinolone ointment (KENALOG) 0.5 %, Apply 1 application topically 2 (two) times daily., Disp: 30 g, Rfl: 0 .  Vitamin D, Ergocalciferol, (DRISDOL) 50000 units CAPS capsule, Take 1 capsule (50,000 Units total) by mouth every 7 (seven) days. Reported on 01/14/2016, Disp: 12 capsule, Rfl: 4  Review of  Systems  Constitutional: Positive for fatigue and fever (low grade 99). Negative for appetite change and chills.  HENT: Positive for congestion, postnasal drip, sinus pressure and sore throat. Negative for ear pain and rhinorrhea.   Respiratory: Positive for cough. Negative for hemoptysis, chest tightness, shortness of breath and wheezing.   Cardiovascular: Positive for chest pain. Negative for palpitations.  Gastrointestinal: Negative for abdominal pain, nausea and vomiting.  Neurological: Positive for headaches. Negative for dizziness and weakness.    Social History   Tobacco Use  . Smoking status: Former Smoker    Packs/day: 1.00    Years: 10.00    Pack years: 10.00    Types:  Cigarettes    Last attempt to quit: 11/10/1979    Years since quitting: 38.5  . Smokeless tobacco: Never Used  Substance Use Topics  . Alcohol use: Yes    Alcohol/week: 0.0 standard drinks    Comment: occasional- wine   Objective:   BP (!) 160/90 (BP Location: Left Arm, Patient Position: Sitting, Cuff Size: Normal)   Pulse 84   Temp 98.8 F (37.1 C) (Oral)   Resp 18   Wt 146 lb (66.2 kg)   SpO2 96% Comment: room air  BMI 24.30 kg/m  Vitals:   05/18/18 1452  BP: (!) 160/90  Pulse: 84  Resp: 18  Temp: 98.8 F (37.1 C)  TempSrc: Oral  SpO2: 96%  Weight: 146 lb (66.2 kg)     Physical Exam  General Appearance:    Alert, cooperative, no distress  HENT:   bilateral TM normal without fluid or infection, neck has bilateral anterior cervical nodes enlarged, throat normal without erythema or exudate, frontal sinus tender and nasal mucosa congested  Eyes:    PERRL, conjunctiva/corneas clear, EOM's intact       Lungs:     Clear to auscultation bilaterally, respirations unlabored  Heart:    Regular rate and rhythm  Neurologic:   Awake, alert, oriented x 3. No apparent focal neurological           defect.      Flu a - Negative Flu b - Negative     Assessment & Plan:     1. Cough   2. Upper respiratory tract infection, unspecified type   3. Sinusitis, unspecified chronicity, unspecified location  - amoxicillin (AMOXIL) 500 MG capsule; Take 2 capsules (1,000 mg total) by mouth 2 (two) times daily for 10 days.  Dispense: 40 capsule; Refill: 0  4. Need for influenza vaccination  - POCT Influenza A/B  Call if symptoms change or if not rapidly improving.         [name], MD  [facility]

## 2018-05-22 ENCOUNTER — Other Ambulatory Visit: Payer: Self-pay | Admitting: Orthopedic Surgery

## 2018-05-22 DIAGNOSIS — M25511 Pain in right shoulder: Secondary | ICD-10-CM

## 2018-05-22 DIAGNOSIS — S42131D Displaced fracture of coracoid process, right shoulder, subsequent encounter for fracture with routine healing: Secondary | ICD-10-CM

## 2018-06-08 ENCOUNTER — Ambulatory Visit
Admission: RE | Admit: 2018-06-08 | Discharge: 2018-06-08 | Disposition: A | Discharge status: Home / Self Care | Payer: Medicare PPO | Source: Ambulatory Visit | Attending: Orthopedic Surgery | Admitting: Orthopedic Surgery

## 2018-06-08 DIAGNOSIS — M25511 Pain in right shoulder: Secondary | ICD-10-CM | POA: Diagnosis present

## 2018-06-08 DIAGNOSIS — S42131D Displaced fracture of coracoid process, right shoulder, subsequent encounter for fracture with routine healing: Secondary | ICD-10-CM | POA: Diagnosis not present

## 2018-06-10 ENCOUNTER — Ambulatory Visit
Admission: RE | Admit: 2018-06-10 | Discharge: 2018-06-10 | Disposition: A | Discharge status: Home / Self Care | Payer: Medicare PPO | Source: Ambulatory Visit | Attending: Orthopedic Surgery | Admitting: Orthopedic Surgery

## 2018-06-10 DIAGNOSIS — M50223 Other cervical disc displacement at C6-C7 level: Secondary | ICD-10-CM | POA: Diagnosis not present

## 2018-06-10 DIAGNOSIS — R937 Abnormal findings on diagnostic imaging of other parts of musculoskeletal system: Secondary | ICD-10-CM | POA: Diagnosis not present

## 2018-06-10 DIAGNOSIS — X58XXXD Exposure to other specified factors, subsequent encounter: Secondary | ICD-10-CM | POA: Diagnosis not present

## 2018-06-10 DIAGNOSIS — M25511 Pain in right shoulder: Secondary | ICD-10-CM

## 2018-06-10 DIAGNOSIS — S42131D Displaced fracture of coracoid process, right shoulder, subsequent encounter for fracture with routine healing: Secondary | ICD-10-CM | POA: Insufficient documentation

## 2018-07-04 DIAGNOSIS — S42131G Displaced fracture of coracoid process, right shoulder, subsequent encounter for fracture with delayed healing: Secondary | ICD-10-CM | POA: Diagnosis not present

## 2018-07-05 ENCOUNTER — Encounter: Payer: Self-pay | Admitting: Family Medicine

## 2018-07-05 ENCOUNTER — Ambulatory Visit (INDEPENDENT_AMBULATORY_CARE_PROVIDER_SITE_OTHER): Payer: Medicare Other | Admitting: Family Medicine

## 2018-07-05 VITALS — BP 131/67 | HR 60 | Temp 98.1°F | Resp 18 | Wt 143.0 lb

## 2018-07-05 DIAGNOSIS — L989 Disorder of the skin and subcutaneous tissue, unspecified: Secondary | ICD-10-CM | POA: Diagnosis not present

## 2018-07-05 DIAGNOSIS — M810 Age-related osteoporosis without current pathological fracture: Secondary | ICD-10-CM | POA: Diagnosis not present

## 2018-07-05 DIAGNOSIS — J0100 Acute maxillary sinusitis, unspecified: Secondary | ICD-10-CM

## 2018-07-05 MED ORDER — AMOXICILLIN 500 MG PO CAPS: 1000.0000 mg | ORAL_CAPSULE | Freq: Three times a day (TID) | ORAL | 0 refills | Status: AC

## 2018-07-05 NOTE — Patient Instructions (Signed)
.   Please bring all of your medications to every appointment so we can make sure that our medication list is the same as yours.    You can try applying mupirocin ointment  (Bactroban) to the skin lesions on your leg. If it is no improving within 10 days then you should have it evaluated by your dermatologist

## 2018-07-05 NOTE — Progress Notes (Signed)
Patient: Heidi Hernandez Female    DOB: 10-01-1950   68 y.o.   MRN: [id number] Visit Date: 07/05/2018  Today's Provider: [name], MD   Chief Complaint  Patient presents with  . URI  . Other    Skin lesion   Subjective:     URI   This is a new problem. Episode onset: 1 week ago. The problem has been gradually worsening. There has been no fever. Associated symptoms include congestion (sinus), coughing (dry), ear pain (both ears), headaches, a plugged ear sensation, rhinorrhea, sinus pain, sneezing, a sore throat (scratchy) and swollen glands. Pertinent negatives include no abdominal pain, chest pain, nausea, vomiting or wheezing. Treatments tried: Zyrtec. The treatment provided no relief.   She also has lesion on left anterior lower leg that is scabbed and slightly erythematous. She states it has been there for over a month and seems to be getting larger.  Allergies  Allergen Reactions  . Augmentin [Amoxicillin-Pot Clavulanate] Nausea And Vomiting  . Droperidol Other (See Comments)    "Locked jaw" Facial muscles locked "Locked jaw"  . Paroxetine Hcl Other (See Comments)    syncope Passed out   . Morphine Itching     Current Outpatient Medications:  .  albuterol (VENTOLIN HFA) 108 (90 Base) MCG/ACT inhaler, Inhale 2 puffs into the lungs every 6 (six) hours as needed for wheezing. May substitute generic or equivalent brand name, Disp: 18 g, Rfl: 1 .  alendronate (FOSAMAX) 70 MG tablet, Take 1 tablet (70 mg total) by mouth once a week., Disp: 12 tablet, Rfl: 4 .  amLODipine (NORVASC) 2.5 MG tablet, Take 1 tablet (2.5 mg total) by mouth daily., Disp: 90 tablet, Rfl: 3 .  atenolol (TENORMIN) 50 MG tablet, TAKE 1 TABLET BY MOUTH ONCE DAILY, Disp: 30 tablet, Rfl: 12 .  azelastine (ASTELIN) 0.1 % nasal spray, Place 2 sprays into both nostrils daily. , Disp: , Rfl:  .  cholestyramine (QUESTRAN) 4 GM/DOSE powder, Take 1 packet (4 g total) by mouth 2 (two) times daily with a  meal., Disp: 378 g, Rfl: 12 .  Fexofenadine HCl (ALLEGRA PO), Take 1 capsule by mouth daily., Disp: , Rfl:  .  fluticasone (FLONASE) 50 MCG/ACT nasal spray, Place 2 sprays into both nostrils daily. , Disp: , Rfl:  .  fluticasone (FLOVENT HFA) 44 MCG/ACT inhaler, Inhale 1 puff into the lungs daily., Disp: , Rfl:  .  LORazepam (ATIVAN) 1 MG tablet, Take 0.5 tablets (0.5 mg total) by mouth every 6 (six) hours as needed for anxiety., Disp: 15 tablet, Rfl: 4 .  montelukast (SINGULAIR) 10 MG tablet, Take 10 mg by mouth at bedtime. , Disp: , Rfl:  .  omeprazole (PRILOSEC) 40 MG capsule, Take 40 mg by mouth daily., Disp: , Rfl:  .  pramipexole (MIRAPEX) 1 MG tablet, TAKE 1 TABLET BY MOUTH TWICE DAILY, Disp: 180 tablet, Rfl: 3 .  RABEprazole (ACIPHEX) 20 MG tablet, Take 20 mg by mouth 2 (two) times daily. , Disp: , Rfl:  .  sucralfate (CARAFATE) 1 g tablet, Take 1 g by mouth 2 (two) times daily. , Disp: , Rfl:  .  SUMAtriptan (IMITREX) 100 MG tablet, TAKE 1 TABLET AT ONSET OF HEADACHE,MAY TAKE AN ADDITIONAL TABLET IN 2HRS IF NEEDED BUT MAX 2 TABLETS PER 24 HRS, Disp: 9 tablet, Rfl: 5 .  triamcinolone ointment (KENALOG) 0.5 %, Apply 1 application topically 2 (two) times daily., Disp: 30 g, Rfl: 0 .  Vitamin D, Ergocalciferol, (DRISDOL) 50000 units CAPS capsule, Take 1 capsule (50,000 Units total) by mouth every 7 (seven) days. Reported on 01/14/2016, Disp: 12 capsule, Rfl: 4  Review of Systems  Constitutional: Positive for chills and fatigue. Negative for appetite change, diaphoresis and fever.  HENT: Positive for congestion (sinus), ear pain (both ears), postnasal drip, rhinorrhea, sinus pressure, sinus pain, sneezing, sore throat (scratchy) and voice change.   Eyes: Negative for photophobia, pain, discharge, redness, itching and visual disturbance.  Respiratory: Positive for cough (dry). Negative for chest tightness, shortness of breath and wheezing.   Cardiovascular: Negative for chest pain and  palpitations.  Gastrointestinal: Negative for abdominal pain, nausea and vomiting.  Neurological: Positive for headaches. Negative for dizziness and weakness.    Social History   Tobacco Use  . Smoking status: Former Smoker    Packs/day: 1.00    Years: 10.00    Pack years: 10.00    Types: Cigarettes    Last attempt to quit: 11/10/1979    Years since quitting: 38.6  . Smokeless tobacco: Never Used  Substance Use Topics  . Alcohol use: Yes    Alcohol/week: 0.0 standard drinks    Comment: occasional- wine      Objective:   BP 131/67 (BP Location: Left Arm, Patient Position: Sitting, Cuff Size: Normal)   Pulse 60   Temp 98.1 F (36.7 C) (Oral)   Resp 18   Wt 143 lb (64.9 kg)   SpO2 98% Comment: room air  BMI 23.80 kg/m  Vitals:   07/05/18 0846  BP: 131/67  Pulse: 60  Resp: 18  Temp: 98.1 F (36.7 C)  TempSrc: Oral  SpO2: 98%  Weight: 143 lb (64.9 kg)     Physical Exam  General Appearance:    Alert, cooperative, no distress  HENT:   bilateral TM normal without fluid or infection, neck without nodes, right maxillary sinus tender and nasal mucosa pale and congested  Eyes:    PERRL, conjunctiva/corneas clear, EOM's intact       Lungs:     Clear to auscultation bilaterally, respirations unlabored  Heart:    Regular rate and rhythm  Neurologic:   Awake, alert, oriented x 3. No apparent focal neurological           defect.          Assessment & Plan    1. Acute maxillary sinusitis, recurrence not specified  - amoxicillin (AMOXIL) 500 MG capsule; Take 2 capsules (1,000 mg total) by mouth 3 (three) times daily for 5 days.  Dispense: 30 capsule; Refill: 0  2. Osteoporosis without current pathological fracture, unspecified osteoporosis type She states she may be having surgery on left shoulder in the next few months and her orthopedist would like her to stop alendronate for up to 12 months. This would be reasonable as she has been on it off and on since 2012.   3.  Skin lesion of left leg Is scabbed and slightly inflamed. Concerning for staph, she has prescription for mupirocin which she was advised to try for 10-14 days. If not improving then she will follow up with her dermatologist.      [name], MD  [facility]

## 2018-07-26 DIAGNOSIS — J301 Allergic rhinitis due to pollen: Secondary | ICD-10-CM | POA: Diagnosis not present

## 2018-07-26 DIAGNOSIS — J3081 Allergic rhinitis due to animal (cat) (dog) hair and dander: Secondary | ICD-10-CM | POA: Diagnosis not present

## 2018-07-26 DIAGNOSIS — J3089 Other allergic rhinitis: Secondary | ICD-10-CM | POA: Diagnosis not present

## 2018-08-03 DIAGNOSIS — J301 Allergic rhinitis due to pollen: Secondary | ICD-10-CM | POA: Diagnosis not present

## 2018-08-03 DIAGNOSIS — J3081 Allergic rhinitis due to animal (cat) (dog) hair and dander: Secondary | ICD-10-CM | POA: Diagnosis not present

## 2018-08-03 DIAGNOSIS — J3089 Other allergic rhinitis: Secondary | ICD-10-CM | POA: Diagnosis not present

## 2018-08-07 DIAGNOSIS — J3089 Other allergic rhinitis: Secondary | ICD-10-CM | POA: Diagnosis not present

## 2018-08-07 DIAGNOSIS — J301 Allergic rhinitis due to pollen: Secondary | ICD-10-CM | POA: Diagnosis not present

## 2018-08-07 DIAGNOSIS — J3081 Allergic rhinitis due to animal (cat) (dog) hair and dander: Secondary | ICD-10-CM | POA: Diagnosis not present

## 2018-08-13 ENCOUNTER — Ambulatory Visit (INDEPENDENT_AMBULATORY_CARE_PROVIDER_SITE_OTHER): Payer: Medicare Other | Admitting: Family Medicine

## 2018-08-13 ENCOUNTER — Encounter: Payer: Self-pay | Admitting: Family Medicine

## 2018-08-13 VITALS — BP 128/68 | HR 66 | Temp 98.2°F | Ht 63.5 in | Wt 141.2 lb

## 2018-08-13 DIAGNOSIS — R6889 Other general symptoms and signs: Secondary | ICD-10-CM

## 2018-08-13 DIAGNOSIS — J019 Acute sinusitis, unspecified: Secondary | ICD-10-CM

## 2018-08-13 DIAGNOSIS — R05 Cough: Secondary | ICD-10-CM | POA: Diagnosis not present

## 2018-08-13 DIAGNOSIS — R059 Cough, unspecified: Secondary | ICD-10-CM

## 2018-08-13 MED ORDER — HYDROCOD POLST-CPM POLST ER 10-8 MG/5ML PO SUER: 5.0000 mL | Freq: Two times a day (BID) | ORAL | 0 refills | Status: AC | PRN

## 2018-08-13 MED ORDER — OSELTAMIVIR PHOSPHATE 75 MG PO CAPS: 75.0000 mg | ORAL_CAPSULE | Freq: Two times a day (BID) | ORAL | 0 refills | Status: AC

## 2018-08-13 MED ORDER — LEVOFLOXACIN 500 MG PO TABS: 500.0000 mg | ORAL_TABLET | Freq: Every day | ORAL | 0 refills | Status: DC

## 2018-08-13 NOTE — Patient Instructions (Signed)
.   Please review the attached list of medications and notify my office if there are any errors.   . Please bring all of your medications to every appointment so we can make sure that our medication list is the same as yours.   

## 2018-08-13 NOTE — Progress Notes (Signed)
Patient: Heidi Hernandez Female    DOB: 11/03/1950   68 y.o.   MRN: [id number] Visit Date: 08/13/2018  Today's Provider: [name], MD   Chief Complaint  Patient presents with  . flu like symptoms    started friday with cough and sat seemed worse   Subjective:     Influenza  This is a new problem. The current episode started in the past 7 days. The problem occurs constantly. The problem has been gradually worsening. Associated symptoms include congestion, coughing (bad to where she cant sleep. with some SOB), fatigue, a fever (around 100 and this morning it was 99.5), headaches and a sore throat. The symptoms are aggravated by coughing. She has tried nothing for the symptoms.    Has had very sore throat neck, mild headache and muscle aches.     Allergies  Allergen Reactions  . Augmentin [Amoxicillin-Pot Clavulanate] Nausea And Vomiting  . Droperidol Other (See Comments)    "Locked jaw" Facial muscles locked "Locked jaw"  . Paroxetine Hcl Other (See Comments)    syncope Passed out   . Morphine Itching     Current Outpatient Medications:  .  albuterol (VENTOLIN HFA) 108 (90 Base) MCG/ACT inhaler, Inhale 2 puffs into the lungs every 6 (six) hours as needed for wheezing. May substitute generic or equivalent brand name, Disp: 18 g, Rfl: 1 .  amLODipine (NORVASC) 2.5 MG tablet, Take 1 tablet (2.5 mg total) by mouth daily., Disp: 90 tablet, Rfl: 3 .  atenolol (TENORMIN) 50 MG tablet, TAKE 1 TABLET BY MOUTH ONCE DAILY, Disp: 30 tablet, Rfl: 12 .  azelastine (ASTELIN) 0.1 % nasal spray, Place 2 sprays into both nostrils daily. , Disp: , Rfl:  .  Fexofenadine HCl (ALLEGRA PO), Take 1 capsule by mouth daily., Disp: , Rfl:  .  fluticasone (FLONASE) 50 MCG/ACT nasal spray, Place 2 sprays into both nostrils daily. , Disp: , Rfl:  .  fluticasone (FLOVENT HFA) 44 MCG/ACT inhaler, Inhale 1 puff into the lungs daily., Disp: , Rfl:  .  LORazepam (ATIVAN) 1 MG tablet, Take 0.5  tablets (0.5 mg total) by mouth every 6 (six) hours as needed for anxiety., Disp: 15 tablet, Rfl: 4 .  montelukast (SINGULAIR) 10 MG tablet, Take 10 mg by mouth at bedtime. , Disp: , Rfl:  .  omeprazole (PRILOSEC) 40 MG capsule, Take 40 mg by mouth daily., Disp: , Rfl:  .  pramipexole (MIRAPEX) 1 MG tablet, TAKE 1 TABLET BY MOUTH TWICE DAILY, Disp: 180 tablet, Rfl: 3 .  RABEprazole (ACIPHEX) 20 MG tablet, Take 20 mg by mouth 2 (two) times daily. , Disp: , Rfl:  .  SUMAtriptan (IMITREX) 100 MG tablet, TAKE 1 TABLET AT ONSET OF HEADACHE,MAY TAKE AN ADDITIONAL TABLET IN 2HRS IF NEEDED BUT MAX 2 TABLETS PER 24 HRS, Disp: 9 tablet, Rfl: 5 .  triamcinolone ointment (KENALOG) 0.5 %, Apply 1 application topically 2 (two) times daily., Disp: 30 g, Rfl: 0 .  Vitamin D, Ergocalciferol, (DRISDOL) 50000 units CAPS capsule, Take 1 capsule (50,000 Units total) by mouth every 7 (seven) days. Reported on 01/14/2016, Disp: 12 capsule, Rfl: 4 .  alendronate (FOSAMAX) 70 MG tablet, Take 1 tablet (70 mg total) by mouth once a week. (Patient not taking: Reported on 08/13/2018), Disp: 12 tablet, Rfl: 4 .  cholestyramine (QUESTRAN) 4 GM/DOSE powder, Take 1 packet (4 g total) by mouth 2 (two) times daily with a meal. (Patient not taking: Reported on  08/13/2018), Disp: 378 g, Rfl: 12  Review of Systems  Constitutional: Positive for fatigue and fever (around 100 and this morning it was 99.5).  HENT: Positive for congestion and sore throat.   Eyes: Negative.   Respiratory: Positive for cough (bad to where she cant sleep. with some SOB).   Cardiovascular: Negative.   Gastrointestinal: Negative.   Endocrine: Negative.   Genitourinary: Negative.   Musculoskeletal: Negative.   Skin: Negative.   Allergic/Immunologic: Negative.   Neurological: Positive for headaches.  Hematological: Negative.   Psychiatric/Behavioral: Negative.     Social History   Tobacco Use  . Smoking status: Former Smoker    Packs/day: 1.00     Years: 10.00    Pack years: 10.00    Types: Cigarettes    Last attempt to quit: 11/10/1979    Years since quitting: 38.7  . Smokeless tobacco: Never Used  Substance Use Topics  . Alcohol use: Yes    Alcohol/week: 0.0 standard drinks    Comment: occasional- wine      Objective:   BP 128/68 (BP Location: Left Arm, Patient Position: Sitting, Cuff Size: Normal)   Pulse 66   Temp 98.2 F (36.8 C) (Oral)   Ht 5' 3.5" (1.613 m)   Wt 141 lb 3.2 oz (64 kg)   SpO2 97%   BMI 24.62 kg/m  Vitals:   08/13/18 1049  BP: 128/68  Pulse: 66  Temp: 98.2 F (36.8 C)  TempSrc: Oral  SpO2: 97%  Weight: 141 lb 3.2 oz (64 kg)  Height: 5' 3.5" (1.613 m)     Physical Exam   General Appearance:    Alert, cooperative, no distress  HENT:   bilateral TM normal without fluid or infection, neck without nodes, pharynx erythematous without exudate, sinuses nontender and nasal mucosa congested  Eyes:    PERRL, conjunctiva/corneas clear, EOM's intact       Lungs:     Clear to auscultation bilaterally, respirations unlabored  Heart:    Regular rate and rhythm  Neurologic:   Awake, alert, oriented x 3. No apparent focal neurological           defect.           Assessment & Plan    1. Cough  - chlorpheniramine-HYDROcodone (TUSSIONEX PENNKINETIC ER) 10-8 MG/5ML SUER; Take 5 mLs by mouth every 12 (twelve) hours as needed for up to 30 days for cough.  Dispense: 114 mL; Refill: 0  2. Acute sinusitis, recurrence not specified, unspecified location  - levofloxacin (LEVAQUIN) 500 MG tablet; Take 1 tablet (500 mg total) by mouth daily.  Dispense: 7 tablet; Refill: 0  3. Flu-like symptoms Patient is unable to get flu vaccine and no flu-tests are available at this time.  - oseltamivir (TAMIFLU) 75 MG capsule; Take 1 capsule (75 mg total) by mouth 2 (two) times daily for 5 days.  Dispense: 10 capsule; Refill: 0     [name], MD  [facility]

## 2018-08-14 ENCOUNTER — Encounter: Payer: Self-pay | Admitting: Family Medicine

## 2018-08-14 DIAGNOSIS — D849 Immunodeficiency, unspecified: Secondary | ICD-10-CM | POA: Insufficient documentation

## 2018-08-14 DIAGNOSIS — D899 Disorder involving the immune mechanism, unspecified: Secondary | ICD-10-CM

## 2018-08-15 ENCOUNTER — Telehealth: Payer: Self-pay | Admitting: Family Medicine

## 2018-08-15 DIAGNOSIS — S42131G Displaced fracture of coracoid process, right shoulder, subsequent encounter for fracture with delayed healing: Secondary | ICD-10-CM | POA: Diagnosis not present

## 2018-08-15 MED ORDER — ONDANSETRON 4 MG PO TBDP: 4.0000 mg | ORAL_TABLET | Freq: Three times a day (TID) | ORAL | 0 refills | Status: DC | PRN

## 2018-08-15 NOTE — Telephone Encounter (Signed)
Tried calling patient. Left message to call back. 

## 2018-08-15 NOTE — Telephone Encounter (Signed)
Pt was in on Monday and given Leviquin. She said this is making her very nauseated.  She wants to know if she can get something for the nausea so she can continue to take the rest of the medication  CB#  [phone]  [facility]  Thanks Barth Kirks

## 2018-08-15 NOTE — Telephone Encounter (Signed)
Patient advised.

## 2018-08-15 NOTE — Telephone Encounter (Signed)
Prescription for ondansetron sent to [facility]

## 2018-08-21 DIAGNOSIS — J3089 Other allergic rhinitis: Secondary | ICD-10-CM | POA: Diagnosis not present

## 2018-08-21 DIAGNOSIS — J301 Allergic rhinitis due to pollen: Secondary | ICD-10-CM | POA: Diagnosis not present

## 2018-08-21 DIAGNOSIS — J3081 Allergic rhinitis due to animal (cat) (dog) hair and dander: Secondary | ICD-10-CM | POA: Diagnosis not present

## 2018-08-24 ENCOUNTER — Telehealth: Payer: Self-pay | Admitting: Family Medicine

## 2018-08-24 DIAGNOSIS — J019 Acute sinusitis, unspecified: Secondary | ICD-10-CM

## 2018-08-24 MED ORDER — LEVOFLOXACIN 500 MG PO TABS: 500.0000 mg | ORAL_TABLET | Freq: Every day | ORAL | 0 refills | Status: AC

## 2018-08-24 NOTE — Telephone Encounter (Signed)
Please review. Thanks!  

## 2018-08-24 NOTE — Telephone Encounter (Signed)
pt finished Levaquin and Tamiflu three days ago and she is now coughing again.  She wants to know if you can give her another Rx of the Levaquin.  she said sometimes it takes two rounds.  CB#  [phone]  [facility]  thanks Barth Kirks

## 2018-08-30 ENCOUNTER — Telehealth: Payer: Self-pay

## 2018-08-30 NOTE — Telephone Encounter (Signed)
Frequent diarrhea with history of nausea and vomiting with fever should go for probable IV rehydration.

## 2018-08-30 NOTE — Telephone Encounter (Signed)
Patient called complaining of vomiting and diarrhea since last night. The vomiting has resolved, but she still has watery diarrhea every 15-30 minutes. She also has lower abdominal pain. She feels weak and is unable to keep anything down. Patient has a fever of 101.2 last night. I advised patient that she needs to go to the ER. Patient request that I send a message to see what the best options are for her. Please advise.

## 2018-08-30 NOTE — Telephone Encounter (Signed)
L/M advising below. Unable to contact the patient.

## 2018-08-31 ENCOUNTER — Encounter: Payer: Self-pay | Admitting: Emergency Medicine

## 2018-08-31 ENCOUNTER — Other Ambulatory Visit: Payer: Self-pay

## 2018-08-31 ENCOUNTER — Emergency Department
Admission: EM | Admit: 2018-08-31 | Discharge: 2018-08-31 | Disposition: A | Discharge status: Home / Self Care | Payer: Medicare Other | Attending: Emergency Medicine | Admitting: Emergency Medicine

## 2018-08-31 DIAGNOSIS — Z87891 Personal history of nicotine dependence: Secondary | ICD-10-CM | POA: Insufficient documentation

## 2018-08-31 DIAGNOSIS — R5381 Other malaise: Secondary | ICD-10-CM | POA: Diagnosis not present

## 2018-08-31 DIAGNOSIS — I10 Essential (primary) hypertension: Secondary | ICD-10-CM | POA: Diagnosis not present

## 2018-08-31 DIAGNOSIS — I959 Hypotension, unspecified: Secondary | ICD-10-CM | POA: Diagnosis not present

## 2018-08-31 DIAGNOSIS — E860 Dehydration: Secondary | ICD-10-CM | POA: Diagnosis not present

## 2018-08-31 DIAGNOSIS — R1013 Epigastric pain: Secondary | ICD-10-CM | POA: Insufficient documentation

## 2018-08-31 DIAGNOSIS — R112 Nausea with vomiting, unspecified: Secondary | ICD-10-CM | POA: Diagnosis not present

## 2018-08-31 DIAGNOSIS — Z79899 Other long term (current) drug therapy: Secondary | ICD-10-CM | POA: Diagnosis not present

## 2018-08-31 DIAGNOSIS — R531 Weakness: Secondary | ICD-10-CM | POA: Insufficient documentation

## 2018-08-31 DIAGNOSIS — J45909 Unspecified asthma, uncomplicated: Secondary | ICD-10-CM | POA: Diagnosis not present

## 2018-08-31 DIAGNOSIS — R197 Diarrhea, unspecified: Secondary | ICD-10-CM | POA: Diagnosis not present

## 2018-08-31 LAB — URINALYSIS, COMPLETE (UACMP) WITH MICROSCOPIC
Bacteria, UA: NONE SEEN
Bilirubin Urine: NEGATIVE
Glucose, UA: NEGATIVE mg/dL
Ketones, ur: 20 mg/dL — AB
Leukocytes,Ua: NEGATIVE
Nitrite: NEGATIVE
Protein, ur: 30 mg/dL — AB
SPECIFIC GRAVITY, URINE: 1.021 (ref 1.005–1.030)
pH: 5 (ref 5.0–8.0)

## 2018-08-31 LAB — CBC
HCT: 43.7 % (ref 36.0–46.0)
HEMOGLOBIN: 15 g/dL (ref 12.0–15.0)
MCH: 32 pg (ref 26.0–34.0)
MCHC: 34.3 g/dL (ref 30.0–36.0)
MCV: 93.2 fL (ref 80.0–100.0)
Platelets: 164 10*3/uL (ref 150–400)
RBC: 4.69 MIL/uL (ref 3.87–5.11)
RDW: 14.3 % (ref 11.5–15.5)
WBC: 11.9 10*3/uL — ABNORMAL HIGH (ref 4.0–10.5)
nRBC: 0 % (ref 0.0–0.2)

## 2018-08-31 LAB — COMPREHENSIVE METABOLIC PANEL
ALBUMIN: 4.1 g/dL (ref 3.5–5.0)
ALT: 19 U/L (ref 0–44)
AST: 27 U/L (ref 15–41)
Alkaline Phosphatase: 56 U/L (ref 38–126)
Anion gap: 9 (ref 5–15)
BUN: 22 mg/dL (ref 8–23)
CO2: 20 mmol/L — ABNORMAL LOW (ref 22–32)
Calcium: 8.5 mg/dL — ABNORMAL LOW (ref 8.9–10.3)
Chloride: 111 mmol/L (ref 98–111)
Creatinine, Ser: 0.68 mg/dL (ref 0.44–1.00)
GFR calc Af Amer: >60 mL/min (ref >60)
GFR calc non Af Amer: >60 mL/min (ref >60)
Glucose, Bld: 100 mg/dL — ABNORMAL HIGH (ref 70–99)
POTASSIUM: 3.3 mmol/L — AB (ref 3.5–5.1)
Sodium: 140 mmol/L (ref 135–145)
Total Bilirubin: 0.8 mg/dL (ref 0.3–1.2)
Total Protein: 7 g/dL (ref 6.5–8.1)

## 2018-08-31 LAB — LIPASE, BLOOD: LIPASE: 30 U/L (ref 11–51)

## 2018-08-31 MED ORDER — SODIUM CHLORIDE 0.9 % IV BOLUS
500.0000 mL | Freq: Once | INTRAVENOUS | Status: AC
  Administered 2018-08-31: 500 mL via INTRAVENOUS

## 2018-08-31 MED ORDER — METOCLOPRAMIDE HCL 5 MG/ML IJ SOLN
10.0000 mg | Freq: Once | INTRAMUSCULAR | Status: AC
  Administered 2018-08-31: 10 mg via INTRAVENOUS
  Filled 2018-08-31: qty 2

## 2018-08-31 MED ORDER — ONDANSETRON 8 MG PO TBDP: 8.0000 mg | ORAL_TABLET | Freq: Three times a day (TID) | ORAL | 0 refills | Status: AC | PRN

## 2018-08-31 MED ORDER — FAMOTIDINE IN NACL 20-0.9 MG/50ML-% IV SOLN
20.0000 mg | Freq: Once | INTRAVENOUS | Status: AC
  Administered 2018-08-31: 20 mg via INTRAVENOUS
  Filled 2018-08-31: qty 50

## 2018-08-31 MED ORDER — ONDANSETRON HCL 4 MG/2ML IJ SOLN
4.0000 mg | Freq: Once | INTRAMUSCULAR | Status: AC
  Administered 2018-08-31: 4 mg via INTRAVENOUS
  Filled 2018-08-31: qty 2

## 2018-08-31 MED ORDER — FAMOTIDINE 20 MG PO TABS: 20.0000 mg | ORAL_TABLET | Freq: Two times a day (BID) | ORAL | 0 refills | Status: DC

## 2018-08-31 MED ORDER — SODIUM CHLORIDE 0.9 % IV BOLUS
1000.0000 mL | Freq: Once | INTRAVENOUS | Status: AC
  Administered 2018-08-31: 1000 mL via INTRAVENOUS

## 2018-08-31 NOTE — ED Provider Notes (Signed)
[facility] Emergency Department Provider Note ____________________________________________   First MD Initiated Contact with Patient 08/31/18 1030     (approximate)  I have reviewed the triage vital signs and the nursing notes.   HISTORY  Chief Complaint Weakness; Emesis; and Diarrhea    HPI Heidi Hernandez is a 68 y.o. female with PMH as noted below who presents with nausea, vomiting, and diarrhea over the last 2 days, multiple episodes of both, with nonbloody diarrhea.  The patient reports some crampy or "rumbling" epigastric pain but no fever.  She states that she feels weak and dehydrated and was told by her doctor to come to the hospital.  She has no sick contacts and no history of travel or other recent illness.  Past Medical History:  Diagnosis Date  . Anxiety   . Asthma   . GERD (gastroesophageal reflux disease)   . Headache    Migraines  . Hypertension   . Idiopathic thrombocytopenia purpura (HCC)    Required splenectomy  . MVP (mitral valve prolapse)   . Neuromuscular disorder (HCC)    Restless Leg Syndrome  . Pancreatitis April 2017 [facility] due to cholecystitis.   . Pneumonia    06-2015    Patient Active Problem List   Diagnosis Date Noted  . Acquired immunocompromised state (HCC) 08/14/2018  . Status post shoulder replacement 04/21/2017  . Rupture of right distal biceps tendon 01/13/2017  . Gallstone pancreatitis 10/15/2015  . Chondrocalcinosis of left ankle 12/24/2014  . Allergic rhinitis 12/23/2014  . Anxiety 12/23/2014  . Asplenia after surgical procedure 12/23/2014  . History of ITP 12/23/2014  . OP (osteoporosis) 12/23/2014  . Restless legs syndrome 12/23/2014  . Neuralgia neuritis, sciatic nerve 12/23/2014  . Actinic keratosis 12/23/2014  . Mitral valve prolapse 12/23/2014  . LVH (left ventricular hypertrophy) 12/23/2014  . Carpal tunnel syndrome 11/19/2014  . Trigger finger, acquired 11/19/2014  . Vitamin D  deficiency 09/28/2012  . Dysthymia 04/14/2009  . Hypertension 04/14/2009  . Headache, migraine 04/14/2009  . Artificial menopause 04/14/2009  . Insomnia 04/14/2009  . Cardiac murmur 04/14/2009    Past Surgical History:  Procedure Laterality Date  . ABDOMINAL HYSTERECTOMY    . APPENDECTOMY    . CARPAL TUNNEL RELEASE Bilateral 11/19/2014   Procedure: CARPAL TUNNEL RELEASE;  Surgeon: Donato Heinz, MD;  Location: [facility] ORS;  Service: Orthopedics;  Laterality: Bilateral;  . CESAREAN SECTION     times 2  . CHOLECYSTECTOMY  09/29/2015   [facility]  . COLONOSCOPY N/A 05/02/2016   Procedure: COLONOSCOPY;  Surgeon: Scot Jun, MD;  Location: [facility] ENDOSCOPY;  Service: Endoscopy;  Laterality: N/A;  . ESOPHAGOGASTRODUODENOSCOPY (EGD) WITH PROPOFOL N/A 05/02/2016   Procedure: ESOPHAGOGASTRODUODENOSCOPY (EGD) WITH PROPOFOL;  Surgeon: Scot Jun, MD;  Location: [facility] ENDOSCOPY;  Service: Endoscopy;  Laterality: N/A;  . ESOPHAGOGASTRODUODENOSCOPY (EGD) WITH PROPOFOL N/A 11/22/2017   Procedure: ESOPHAGOGASTRODUODENOSCOPY (EGD) WITH PROPOFOL;  Surgeon: Scot Jun, MD;  Location: [facility] ENDOSCOPY;  Service: Endoscopy;  Laterality: N/A;  . HERNIA REPAIR     times 3  . JOINT REPLACEMENT    . OVARIAN CYST SURGERY    . SHOULDER ARTHROSCOPY WITH ROTATOR CUFF REPAIR     times 2  . SPLENECTOMY, TOTAL    . TOTAL SHOULDER ARTHROPLASTY Right 04/21/2017   Procedure: REVERSE SHOULDER ARTHROPLASTY;  Surgeon: Signa Kell, MD;  Location: [facility] ORS;  Service: Orthopedics;  Laterality: Right;  . TRIGGER FINGER RELEASE Right 11/19/2014   Procedure:  RELEASE TRIGGER FINGER/A-1 PULLEY;  Surgeon: Donato HeinzJames P Hooten, MD;  Location: [facility] ORS;  Service: Orthopedics;  Laterality: Right;  . TRIGGER FINGER RELEASE Right 08/12/2015   Procedure: RELEASE TRIGGER LONG FINGER;  Surgeon: Donato HeinzJames P Hooten, MD;  Location: [facility] ORS;  Service: Orthopedics;  Laterality: Right;    Prior to Admission medications   Medication Sig Start  Date End Date Taking? Authorizing Provider  albuterol (VENTOLIN HFA) 108 (90 Base) MCG/ACT inhaler Inhale 2 puffs into the lungs every 6 (six) hours as needed for wheezing. May substitute generic or equivalent brand name 07/07/15   Malva LimesFisher, Donald E, MD  alendronate (FOSAMAX) 70 MG tablet Take 1 tablet (70 mg total) by mouth once a week. Patient not taking: Reported on 08/13/2018 01/15/18   Malva LimesFisher, Donald E, MD  amLODipine (NORVASC) 2.5 MG tablet Take 1 tablet (2.5 mg total) by mouth daily. 11/06/17   Malva LimesFisher, Donald E, MD  atenolol (TENORMIN) 50 MG tablet TAKE 1 TABLET BY MOUTH ONCE DAILY 03/12/18   Malva LimesFisher, Donald E, MD  azelastine (ASTELIN) 0.1 % nasal spray Place 2 sprays into both nostrils daily.  12/24/13   [provider]  chlorpheniramine-HYDROcodone (TUSSIONEX PENNKINETIC ER) 10-8 MG/5ML SUER Take 5 mLs by mouth every 12 (twelve) hours as needed for up to 30 days for cough. 08/13/18 09/12/18  Malva LimesFisher, Donald E, MD  cholestyramine Lanetta Inch(QUESTRAN) 4 GM/DOSE powder Take 1 packet (4 g total) by mouth 2 (two) times daily with a meal. Patient not taking: Reported on 08/13/2018 01/14/16   Malva LimesFisher, Donald E, MD  famotidine (PEPCID) 20 MG tablet Take 1 tablet (20 mg total) by mouth 2 (two) times daily for 10 days. 08/31/18 09/10/18  Dionne BucySiadecki, Kerisha Goughnour, MD  Fexofenadine HCl (ALLEGRA PO) Take 1 capsule by mouth daily.    [provider]  fluticasone (FLONASE) 50 MCG/ACT nasal spray Place 2 sprays into both nostrils daily.  12/05/14   [provider]  fluticasone (FLOVENT HFA) 44 MCG/ACT inhaler Inhale 1 puff into the lungs daily.    [provider]  levofloxacin (LEVAQUIN) 500 MG tablet Take 1 tablet (500 mg total) by mouth daily for 7 days. 08/24/18 08/31/18  Malva LimesFisher, Donald E, MD  LORazepam (ATIVAN) 1 MG tablet Take 0.5 tablets (0.5 mg total) by mouth every 6 (six) hours as needed for anxiety. 10/31/17   Malva LimesFisher, Donald E, MD  montelukast (SINGULAIR) 10 MG tablet Take 10 mg by mouth at bedtime.   12/24/13   [provider]  omeprazole (PRILOSEC) 40 MG capsule Take 40 mg by mouth daily. 11/22/17   [provider]  ondansetron (ZOFRAN ODT) 8 MG disintegrating tablet Take 1 tablet (8 mg total) by mouth every 8 (eight) hours as needed for up to 5 days for nausea or vomiting. 08/31/18 09/05/18  Dionne BucySiadecki, Milen Lengacher, MD  pramipexole (MIRAPEX) 1 MG tablet TAKE 1 TABLET BY MOUTH TWICE DAILY 12/17/17   Malva LimesFisher, Donald E, MD  RABEprazole (ACIPHEX) 20 MG tablet Take 20 mg by mouth 2 (two) times daily.  06/01/16   [provider]  SUMAtriptan (IMITREX) 100 MG tablet TAKE 1 TABLET AT ONSET OF HEADACHE,MAY TAKE AN ADDITIONAL TABLET IN 2HRS IF NEEDED BUT MAX 2 TABLETS PER 24 HRS 01/29/18   Malva LimesFisher, Donald E, MD  triamcinolone ointment (KENALOG) 0.5 % Apply 1 application topically 2 (two) times daily. 11/06/17   Malva LimesFisher, Donald E, MD  Vitamin D, Ergocalciferol, (DRISDOL) 50000 units CAPS capsule Take 1 capsule (50,000 Units total) by mouth every 7 (seven) days.  Reported on 01/14/2016 01/16/18   Malva Limes, MD    Allergies Augmentin [amoxicillin-pot clavulanate]; Droperidol; Paroxetine hcl; and Morphine  Family History  Problem Relation Age of Onset  . Hypertension Mother   . Renal Disease Father        ESRD with HD    Social History Social History   Tobacco Use  . Smoking status: Former Smoker    Packs/day: 1.00    Years: 10.00    Pack years: 10.00    Types: Cigarettes    Last attempt to quit: 11/10/1979    Years since quitting: 38.8  . Smokeless tobacco: Never Used  Substance Use Topics  . Alcohol use: Yes    Alcohol/week: 0.0 standard drinks    Comment: occasional- wine  . Drug use: No    Review of Systems  Constitutional: No fever. Eyes: No redness. ENT: No sore throat. Cardiovascular: Denies chest pain. Respiratory: Denies shortness of breath. Gastrointestinal: Positive for vomiting diarrhea. Genitourinary: Negative for dysuria.  Musculoskeletal: Negative  for back pain. Skin: Negative for rash. Neurological: Negative for headache.   ____________________________________________   PHYSICAL EXAM:  VITAL SIGNS: ED Triage Vitals  Enc Vitals Group     BP 08/31/18 1024 (!) 177/76     Pulse Rate 08/31/18 1024 88     Resp --      Temp 08/31/18 1024 98.7 F (37.1 C)     Temp Source 08/31/18 1024 Oral     SpO2 08/31/18 1024 98 %     Weight 08/31/18 1025 131 lb (59.4 kg)     Height 08/31/18 1025  (1.6 m)     Head Circumference --      Peak Flow --      Pain Score 08/31/18 1024 0     Pain Loc --      Pain Edu? --      Excl. in GC? --     Constitutional: Alert and oriented.  Somewhat weak appearing but in no acute distress. Eyes: Conjunctivae are normal.  No scleral icterus. Head: Atraumatic. Nose: No congestion/rhinnorhea. Mouth/Throat: Mucous membranes are dry.   Neck: Normal range of motion.  Cardiovascular:  Good peripheral circulation. Respiratory: Normal respiratory effort.  No retractions.  Gastrointestinal: Soft with mild epigastric tenderness.  No distention.  Genitourinary: No flank tenderness. Musculoskeletal: Extremities warm and well perfused.  Neurologic:  Normal speech and language. No gross focal neurologic deficits are appreciated.  Skin:  Skin is warm and dry. No rash noted. Psychiatric: Mood and affect are normal. Speech and behavior are normal.  ____________________________________________   LABS (all labs ordered are listed, but only abnormal results are displayed)  Labs Reviewed  COMPREHENSIVE METABOLIC PANEL - Abnormal; Notable for the following components:      Result Value   Potassium 3.3 (*)    CO2 20 (*)    Glucose, Bld 100 (*)    Calcium 8.5 (*)    All other components within normal limits  CBC - Abnormal; Notable for the following components:   WBC 11.9 (*)    All other components within normal limits  URINALYSIS, COMPLETE (UACMP) WITH MICROSCOPIC - Abnormal; Notable for the following  components:   Color, Urine YELLOW (*)    APPearance CLEAR (*)    Hgb urine dipstick SMALL (*)    Ketones, ur 20 (*)    Protein, ur 30 (*)    All other components within normal limits  LIPASE, BLOOD   ____________________________________________  EKG  ____________________________________________  RADIOLOGY    ____________________________________________   PROCEDURES  Procedure(s) performed: No  Procedures  Critical Care performed: No ____________________________________________   INITIAL IMPRESSION / ASSESSMENT AND PLAN / ED COURSE  Pertinent labs & imaging results that were available during my care of the patient were reviewed by me and considered in my medical decision making (see chart for details).  68 year old female with PMH as noted above presents with nausea, vomiting, and diarrhea over the last 2 days with multiple episodes of emesis and numerous watery bowel movements with no blood.  She has some epigastric abdominal pain but no fever.  I reviewed the past medical records in Epic.  The patient has had no recent prior ED visits or admissions.  On exam, the patient is slightly weak appearing but in no distress.  She is hypertensive with otherwise normal vital signs.  The abdomen is soft with mild epigastric discomfort but no focal tenderness or peritoneal signs.  Overall presentation is most consistent with viral gastroenteritis versus less likely foodborne illness.  Given the benign abdominal exam, there is no indication for imaging at this time.  We will obtain labs, give fluids, Zofran and Pepcid for symptomatic treatment, and reassess.  ----------------------------------------- 12:55 PM on 08/31/2018 -----------------------------------------  The patient is feeling significantly better and is now tolerating p.o.  She is stable for discharge home.  She had an additional bout of nausea after she stood up although we gave Reglan and she is now doing well.  I  counseled her on the results of the work-up.  Return precautions given, and she expresses understanding. ____________________________________________   FINAL CLINICAL IMPRESSION(S) / ED DIAGNOSES  Final diagnoses:  Nausea vomiting and diarrhea  Dehydration      NEW MEDICATIONS STARTED DURING THIS VISIT:  New Prescriptions   FAMOTIDINE (PEPCID) 20 MG TABLET    Take 1 tablet (20 mg total) by mouth 2 (two) times daily for 10 days.   ONDANSETRON (ZOFRAN ODT) 8 MG DISINTEGRATING TABLET    Take 1 tablet (8 mg total) by mouth every 8 (eight) hours as needed for up to 5 days for nausea or vomiting.     Note:  This document was prepared using Dragon voice recognition software and may include unintentional dictation errors.    Dionne Bucy, MD 08/31/18 1255

## 2018-08-31 NOTE — ED Triage Notes (Addendum)
Pt ems from home for weakness due to N/V/D that started wed. Pt states that she has been unable to hold fluids down. Per EMS pt was orthostatic when they stood her up. Ems gave 500cc bolus and 4 mg zofran. Pt s/p spleenectomy 1990

## 2018-08-31 NOTE — ED Notes (Signed)
MD at bedside with patient and patient's husband. 

## 2018-08-31 NOTE — ED Notes (Signed)
Pt verbalized continued dizziness and feelings of nausea, MD made aware.

## 2018-08-31 NOTE — Discharge Instructions (Signed)
Take the Zofran as needed for nausea.  You should take the Pepcid over the next 10 days to help decrease stomach acid.  Make sure to drink plenty of fluids.  Follow-up with your doctor in 1 to 2 weeks.  Return to the ER for new, worsening, or persistent vomiting, weakness or lightheadedness, abdominal pain, fever, blood in the stool, or any other new or worsening symptoms that concern you.

## 2018-08-31 NOTE — ED Notes (Signed)
Pt able to ambulate to the restroom with staff assist. Dizziness reported immediatly after standing.

## 2018-08-31 NOTE — Telephone Encounter (Signed)
Patient currently in the ER for evaluation.

## 2018-09-02 ENCOUNTER — Other Ambulatory Visit: Payer: Self-pay | Admitting: Family Medicine

## 2018-09-02 DIAGNOSIS — G2581 Restless legs syndrome: Secondary | ICD-10-CM

## 2018-09-13 DIAGNOSIS — J3081 Allergic rhinitis due to animal (cat) (dog) hair and dander: Secondary | ICD-10-CM | POA: Diagnosis not present

## 2018-09-13 DIAGNOSIS — J301 Allergic rhinitis due to pollen: Secondary | ICD-10-CM | POA: Diagnosis not present

## 2018-09-13 DIAGNOSIS — J3089 Other allergic rhinitis: Secondary | ICD-10-CM | POA: Diagnosis not present

## 2018-09-14 ENCOUNTER — Other Ambulatory Visit: Payer: Self-pay | Admitting: Family Medicine

## 2018-09-18 DIAGNOSIS — J3081 Allergic rhinitis due to animal (cat) (dog) hair and dander: Secondary | ICD-10-CM | POA: Diagnosis not present

## 2018-09-18 DIAGNOSIS — J301 Allergic rhinitis due to pollen: Secondary | ICD-10-CM | POA: Diagnosis not present

## 2018-09-18 DIAGNOSIS — J3089 Other allergic rhinitis: Secondary | ICD-10-CM | POA: Diagnosis not present

## 2018-09-25 DIAGNOSIS — J301 Allergic rhinitis due to pollen: Secondary | ICD-10-CM | POA: Diagnosis not present

## 2018-09-25 DIAGNOSIS — J3081 Allergic rhinitis due to animal (cat) (dog) hair and dander: Secondary | ICD-10-CM | POA: Diagnosis not present

## 2018-09-25 DIAGNOSIS — J3089 Other allergic rhinitis: Secondary | ICD-10-CM | POA: Diagnosis not present

## 2018-09-27 DIAGNOSIS — J3089 Other allergic rhinitis: Secondary | ICD-10-CM | POA: Diagnosis not present

## 2018-09-27 DIAGNOSIS — J3081 Allergic rhinitis due to animal (cat) (dog) hair and dander: Secondary | ICD-10-CM | POA: Diagnosis not present

## 2018-09-27 DIAGNOSIS — J301 Allergic rhinitis due to pollen: Secondary | ICD-10-CM | POA: Diagnosis not present

## 2018-10-12 ENCOUNTER — Other Ambulatory Visit: Payer: Self-pay

## 2018-10-12 MED ORDER — LORAZEPAM 1 MG PO TABS: 0.5000 mg | ORAL_TABLET | Freq: Four times a day (QID) | ORAL | 4 refills | Status: DC | PRN

## 2018-10-18 DIAGNOSIS — J301 Allergic rhinitis due to pollen: Secondary | ICD-10-CM | POA: Diagnosis not present

## 2018-10-18 DIAGNOSIS — J3081 Allergic rhinitis due to animal (cat) (dog) hair and dander: Secondary | ICD-10-CM | POA: Diagnosis not present

## 2018-10-18 DIAGNOSIS — J3089 Other allergic rhinitis: Secondary | ICD-10-CM | POA: Diagnosis not present

## 2018-10-23 DIAGNOSIS — J3089 Other allergic rhinitis: Secondary | ICD-10-CM | POA: Diagnosis not present

## 2018-10-23 DIAGNOSIS — J301 Allergic rhinitis due to pollen: Secondary | ICD-10-CM | POA: Diagnosis not present

## 2018-10-23 DIAGNOSIS — J3081 Allergic rhinitis due to animal (cat) (dog) hair and dander: Secondary | ICD-10-CM | POA: Diagnosis not present

## 2018-10-31 IMAGING — CT CT SHOULDER*R* W/O CM
2 series · 10 of 14 positions shown, 12 images · non-contrast
Comparison: 03/24/2017

CLINICAL DATA: Right shoulder pain for 2 weeks. Right total
shoulder replacement in late 8413.

EXAM:
CT OF THE UPPER RIGHT EXTREMITY WITHOUT CONTRAST
TECHNIQUE: Multidetector CT imaging of the upper right extremity was performed
according to the standard protocol.

[Series 3: shoulder (person_name) · axial · 0.43mm/px · z∈[-953,-818]mm · 5 of 104 slices shown, 7 images]
[im 18/104  soft-tissue]
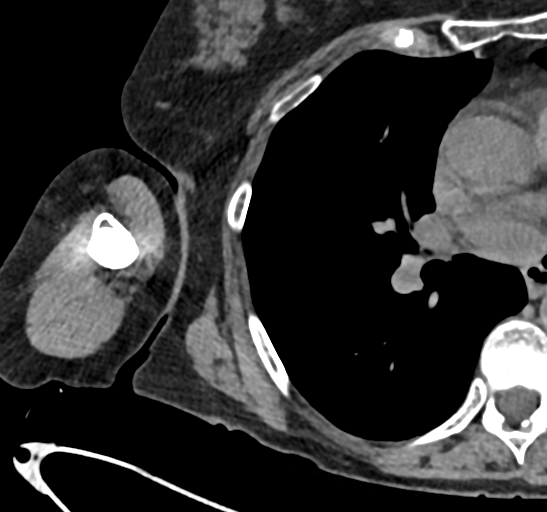
[im 18/104  bone]
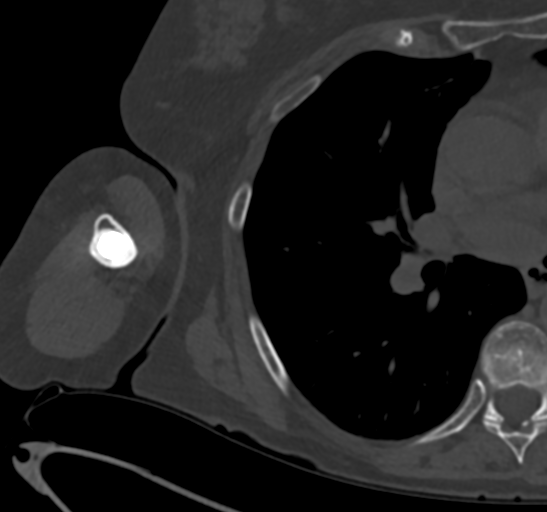
[im 35/104  bone]
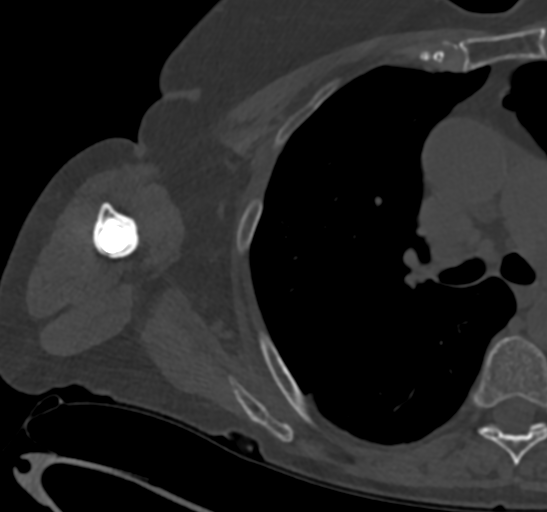
[im 52/104  bone]
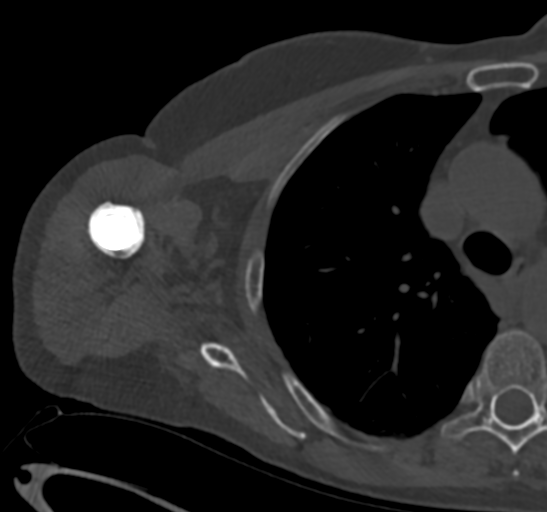
[im 69/104  bone]
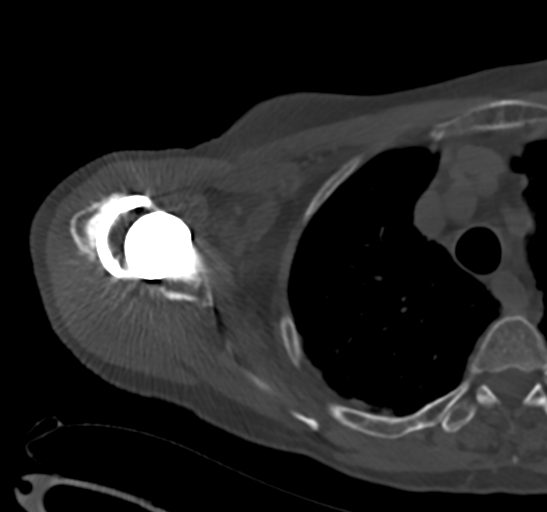
[im 86/104  soft-tissue]
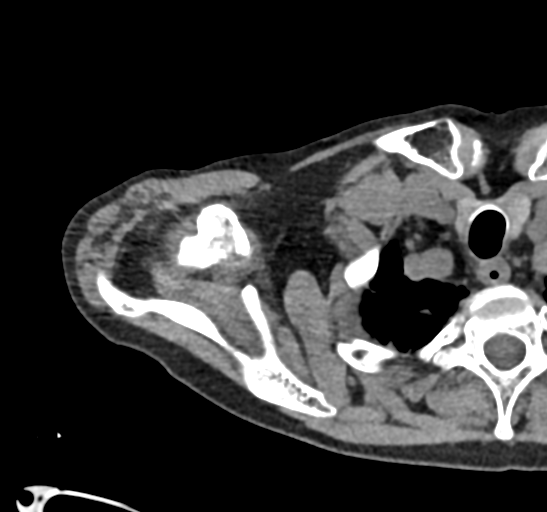
[im 86/104  bone]
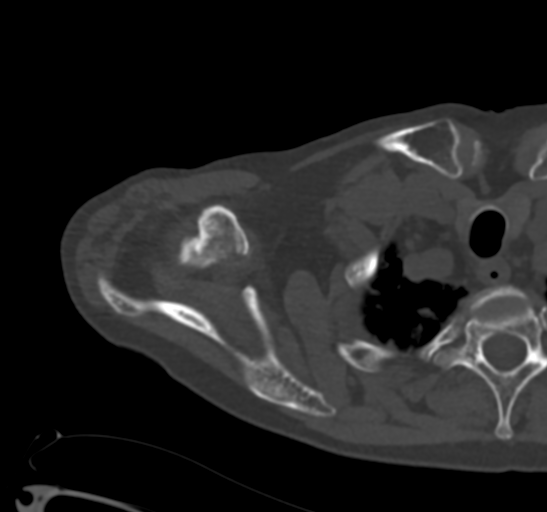

[Series 5: shoulder · axial · 0.43mm/px · z∈[-953,-818]mm · 5 of 104 slices shown]
[im 18/104  bone]
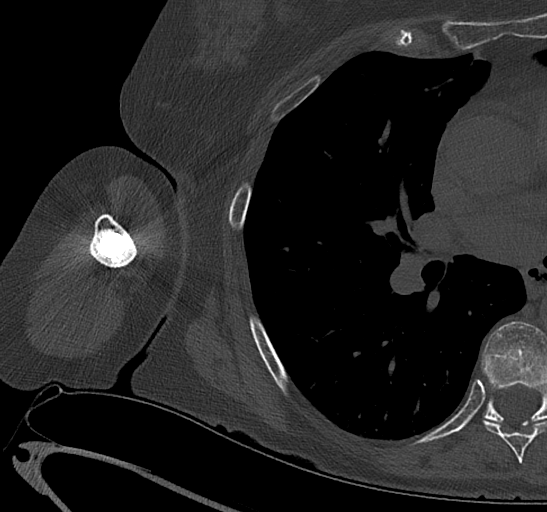
[im 35/104  bone]
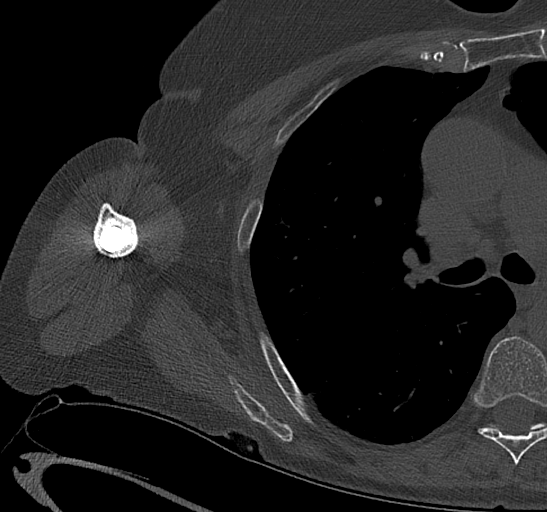
[im 52/104  bone]
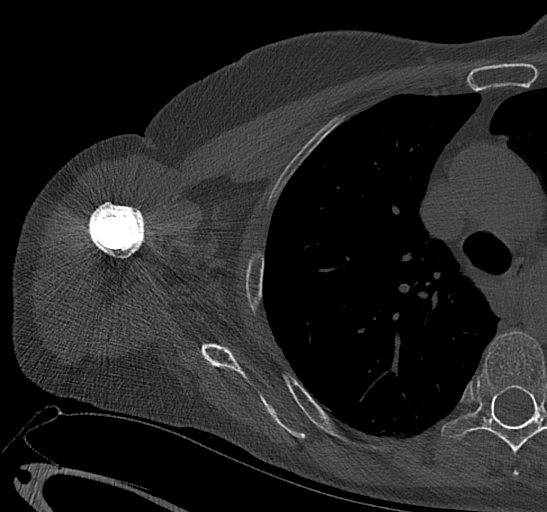
[im 69/104  bone]
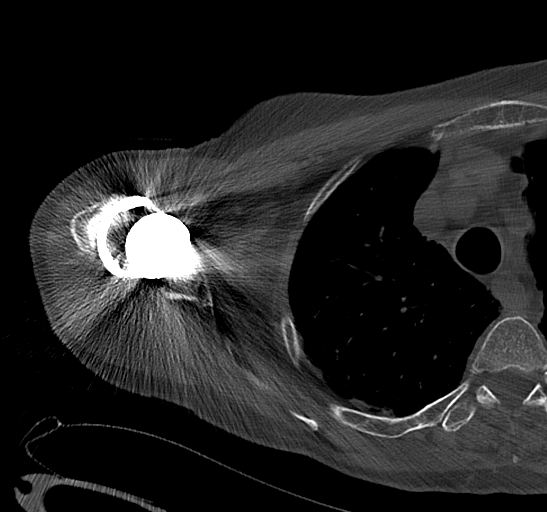
[im 86/104  bone]
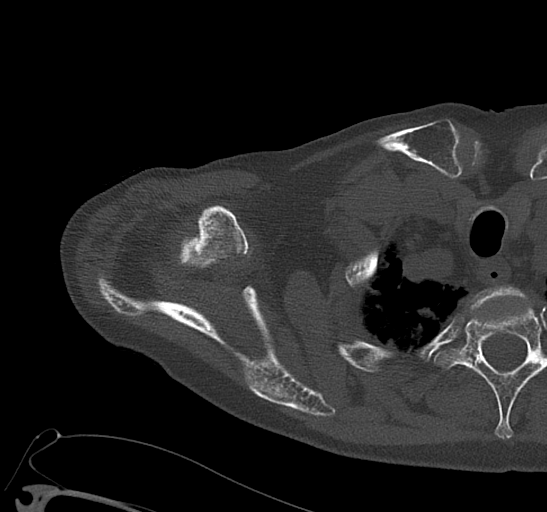

[10 of 14 positions shown; findings below may reference images not displayed]

FINDINGS: Bones/Joint/Cartilage

Reverse total right shoulder arthroplasty noted. Right humeral stem
appears well seated with surrounding the sac relate an without
complicating feature. Glenoid ball component observed without
complicating feature directly related to the screw positioning. No
malalignment.

There is an acute fracture of the base of the coracoid with about 3
mm of superomedial distraction. No definite underlying lesion to
favor a pathologic fracture.

Old healed appearing right anterior rib fractures.

Ligaments

Suboptimally assessed by CT.

Muscles and Tendons

Highly atrophic subscapularis muscle with moderate supraspinatus
atrophy.

Soft tissues

Atherosclerotic calcification of the descending thoracic aorta.
Right apical pleuroparenchymal scarring.
IMPRESSION: 1. Acute fracture of the base of the coracoid with about 3 mm of
superomedial distraction.
2. Reverse total right shoulder arthroplasty noted without
complicating feature related to the prosthesis.
3. Atrophic subscapularis muscle. Moderately atrophic supraspinatus
muscle.
4.  Aortic Atherosclerosis ([id number]).

## 2018-11-01 DIAGNOSIS — J3081 Allergic rhinitis due to animal (cat) (dog) hair and dander: Secondary | ICD-10-CM | POA: Diagnosis not present

## 2018-11-01 DIAGNOSIS — J3089 Other allergic rhinitis: Secondary | ICD-10-CM | POA: Diagnosis not present

## 2018-11-01 DIAGNOSIS — J301 Allergic rhinitis due to pollen: Secondary | ICD-10-CM | POA: Diagnosis not present

## 2018-11-06 DIAGNOSIS — J3089 Other allergic rhinitis: Secondary | ICD-10-CM | POA: Diagnosis not present

## 2018-11-06 DIAGNOSIS — J3081 Allergic rhinitis due to animal (cat) (dog) hair and dander: Secondary | ICD-10-CM | POA: Diagnosis not present

## 2018-11-06 DIAGNOSIS — J301 Allergic rhinitis due to pollen: Secondary | ICD-10-CM | POA: Diagnosis not present

## 2018-11-13 ENCOUNTER — Telehealth: Payer: Self-pay

## 2018-11-13 MED ORDER — ROPINIROLE HCL 0.5 MG PO TABS: ORAL_TABLET | ORAL | 0 refills | Status: DC

## 2018-11-13 NOTE — Telephone Encounter (Signed)
Patient states that she has been taking 2-3 Mirapex a day due to restless leg syndrome becoming worse. She states that she is now starting to have side effects such as facial redness, headache, and bloating. She would like to change her medication to Requip.

## 2018-11-13 NOTE — Telephone Encounter (Signed)
Pt advised.   Thanks,   -Amaury Kuzel  

## 2018-11-13 NOTE — Telephone Encounter (Signed)
Sent prescription for requip to her pharmacy. Let me know in 3-4 weeks how it is working.

## 2018-11-14 ENCOUNTER — Telehealth: Payer: Self-pay

## 2018-11-14 NOTE — Telephone Encounter (Signed)
Patient wants to know if Dr. Sherrie Mustache thinks she should be tested for COVID-19 antibodies. She was seen in the ER on 08/31/2018 for nausea, vomiting and diarrhea and dehydration, but she believes she could have had a touch of COVID-19. Please advise if you think patient should get tested for antibodies.

## 2018-11-14 NOTE — Telephone Encounter (Signed)
No. I don't think it would be beneficial. There is still no evidence whether having antibodies will prevent another infection or not. And if she did have in March there is nothing to do about it now.

## 2018-11-14 NOTE — Telephone Encounter (Signed)
Patient advised and verbally voiced understanding.  

## 2018-11-15 DIAGNOSIS — J3081 Allergic rhinitis due to animal (cat) (dog) hair and dander: Secondary | ICD-10-CM | POA: Diagnosis not present

## 2018-11-15 DIAGNOSIS — J301 Allergic rhinitis due to pollen: Secondary | ICD-10-CM | POA: Diagnosis not present

## 2018-11-15 DIAGNOSIS — J3089 Other allergic rhinitis: Secondary | ICD-10-CM | POA: Diagnosis not present

## 2018-11-21 DIAGNOSIS — S42131G Displaced fracture of coracoid process, right shoulder, subsequent encounter for fracture with delayed healing: Secondary | ICD-10-CM | POA: Diagnosis not present

## 2018-11-29 DIAGNOSIS — J301 Allergic rhinitis due to pollen: Secondary | ICD-10-CM | POA: Diagnosis not present

## 2018-11-29 DIAGNOSIS — J3089 Other allergic rhinitis: Secondary | ICD-10-CM | POA: Diagnosis not present

## 2018-11-29 DIAGNOSIS — J3081 Allergic rhinitis due to animal (cat) (dog) hair and dander: Secondary | ICD-10-CM | POA: Diagnosis not present

## 2018-12-03 ENCOUNTER — Telehealth: Payer: Self-pay | Admitting: Family Medicine

## 2018-12-03 MED ORDER — ROPINIROLE HCL 2 MG PO TABS: ORAL_TABLET | ORAL | 5 refills | Status: DC

## 2018-12-03 NOTE — Telephone Encounter (Signed)
pt has been taking Requip for three weeks and she wants Dr. Caryn Section to know that it is helping.  She does think she needs to increase the dosage to 2 mg's a day  She will also need a refill if it is increased  CB#  [phone]  Con Memos

## 2018-12-04 DIAGNOSIS — J301 Allergic rhinitis due to pollen: Secondary | ICD-10-CM | POA: Diagnosis not present

## 2018-12-04 DIAGNOSIS — J3089 Other allergic rhinitis: Secondary | ICD-10-CM | POA: Diagnosis not present

## 2018-12-04 DIAGNOSIS — J3081 Allergic rhinitis due to animal (cat) (dog) hair and dander: Secondary | ICD-10-CM | POA: Diagnosis not present

## 2018-12-11 DIAGNOSIS — J3089 Other allergic rhinitis: Secondary | ICD-10-CM | POA: Diagnosis not present

## 2018-12-11 DIAGNOSIS — J301 Allergic rhinitis due to pollen: Secondary | ICD-10-CM | POA: Diagnosis not present

## 2018-12-11 DIAGNOSIS — J3081 Allergic rhinitis due to animal (cat) (dog) hair and dander: Secondary | ICD-10-CM | POA: Diagnosis not present

## 2018-12-20 DIAGNOSIS — J3081 Allergic rhinitis due to animal (cat) (dog) hair and dander: Secondary | ICD-10-CM | POA: Diagnosis not present

## 2018-12-20 DIAGNOSIS — J301 Allergic rhinitis due to pollen: Secondary | ICD-10-CM | POA: Diagnosis not present

## 2018-12-20 DIAGNOSIS — J3089 Other allergic rhinitis: Secondary | ICD-10-CM | POA: Diagnosis not present

## 2018-12-25 DIAGNOSIS — J301 Allergic rhinitis due to pollen: Secondary | ICD-10-CM | POA: Diagnosis not present

## 2018-12-25 DIAGNOSIS — J3081 Allergic rhinitis due to animal (cat) (dog) hair and dander: Secondary | ICD-10-CM | POA: Diagnosis not present

## 2018-12-25 DIAGNOSIS — J3089 Other allergic rhinitis: Secondary | ICD-10-CM | POA: Diagnosis not present

## 2019-01-01 DIAGNOSIS — J301 Allergic rhinitis due to pollen: Secondary | ICD-10-CM | POA: Diagnosis not present

## 2019-01-01 DIAGNOSIS — J3081 Allergic rhinitis due to animal (cat) (dog) hair and dander: Secondary | ICD-10-CM | POA: Diagnosis not present

## 2019-01-01 DIAGNOSIS — J3089 Other allergic rhinitis: Secondary | ICD-10-CM | POA: Diagnosis not present

## 2019-01-10 DIAGNOSIS — J3081 Allergic rhinitis due to animal (cat) (dog) hair and dander: Secondary | ICD-10-CM | POA: Diagnosis not present

## 2019-01-10 DIAGNOSIS — J3089 Other allergic rhinitis: Secondary | ICD-10-CM | POA: Diagnosis not present

## 2019-01-10 DIAGNOSIS — J301 Allergic rhinitis due to pollen: Secondary | ICD-10-CM | POA: Diagnosis not present

## 2019-01-25 DIAGNOSIS — J301 Allergic rhinitis due to pollen: Secondary | ICD-10-CM | POA: Diagnosis not present

## 2019-01-25 DIAGNOSIS — J3081 Allergic rhinitis due to animal (cat) (dog) hair and dander: Secondary | ICD-10-CM | POA: Diagnosis not present

## 2019-01-25 DIAGNOSIS — J3089 Other allergic rhinitis: Secondary | ICD-10-CM | POA: Diagnosis not present

## 2019-01-31 DIAGNOSIS — J3081 Allergic rhinitis due to animal (cat) (dog) hair and dander: Secondary | ICD-10-CM | POA: Diagnosis not present

## 2019-01-31 DIAGNOSIS — J301 Allergic rhinitis due to pollen: Secondary | ICD-10-CM | POA: Diagnosis not present

## 2019-01-31 DIAGNOSIS — J3089 Other allergic rhinitis: Secondary | ICD-10-CM | POA: Diagnosis not present

## 2019-02-12 DIAGNOSIS — J3081 Allergic rhinitis due to animal (cat) (dog) hair and dander: Secondary | ICD-10-CM | POA: Diagnosis not present

## 2019-02-12 DIAGNOSIS — J301 Allergic rhinitis due to pollen: Secondary | ICD-10-CM | POA: Diagnosis not present

## 2019-02-12 DIAGNOSIS — J3089 Other allergic rhinitis: Secondary | ICD-10-CM | POA: Diagnosis not present

## 2019-02-22 DIAGNOSIS — J301 Allergic rhinitis due to pollen: Secondary | ICD-10-CM | POA: Diagnosis not present

## 2019-02-22 DIAGNOSIS — J3081 Allergic rhinitis due to animal (cat) (dog) hair and dander: Secondary | ICD-10-CM | POA: Diagnosis not present

## 2019-02-22 DIAGNOSIS — J3089 Other allergic rhinitis: Secondary | ICD-10-CM | POA: Diagnosis not present

## 2019-03-01 DIAGNOSIS — J301 Allergic rhinitis due to pollen: Secondary | ICD-10-CM | POA: Diagnosis not present

## 2019-03-01 DIAGNOSIS — J3089 Other allergic rhinitis: Secondary | ICD-10-CM | POA: Diagnosis not present

## 2019-03-08 DIAGNOSIS — J301 Allergic rhinitis due to pollen: Secondary | ICD-10-CM | POA: Diagnosis not present

## 2019-03-08 DIAGNOSIS — J3089 Other allergic rhinitis: Secondary | ICD-10-CM | POA: Diagnosis not present

## 2019-03-08 DIAGNOSIS — J3081 Allergic rhinitis due to animal (cat) (dog) hair and dander: Secondary | ICD-10-CM | POA: Diagnosis not present

## 2019-03-09 ENCOUNTER — Other Ambulatory Visit: Payer: Self-pay | Admitting: Family Medicine

## 2019-03-10 NOTE — Telephone Encounter (Signed)
Please see if patient is taking one or two at bedtime, if taking two we can change to 4mg  tablets.

## 2019-03-11 NOTE — Progress Notes (Signed)
Subjective:   Heidi Hernandez is a 68 y.o. female who presents for Medicare Annual (Subsequent) preventive examination.    This visit is being conducted through telemedicine due to the COVID-19 pandemic. This patient has given me verbal consent via doximity to conduct this visit, patient states they are participating from their home address. Some vital signs may be absent or patient reported.    Patient identification: identified by name, DOB, and current address  Review of Systems:  N/A  Cardiac Risk Factors include: advanced age (>105men, >59 women);hypertension     Objective:     Vitals: There were no vitals taken for this visit.  There is no height or weight on file to calculate BMI. Unable to obtain vitals due to visit being conducted via telephonically.   Advanced Directives 03/12/2019 08/31/2018 01/15/2018 11/22/2017 04/21/2017 04/07/2017 01/12/2017  Does Patient Have a Medical Advance Directive? No No No No No No No  Would patient like information on creating a medical advance directive? No - Patient declined - No - Patient declined - No - Patient declined No - Patient declined No - Patient declined    Tobacco Social History   Tobacco Use  Smoking Status Former Smoker  . Packs/day: 1.00  . Years: 10.00  . Pack years: 10.00  . Types: Cigarettes  . Quit date: 11/10/1979  . Years since quitting: 39.3  Smokeless Tobacco Never Used     Counseling given: Not Answered   Clinical Intake:  Pre-visit preparation completed: Yes  Pain : No/denies pain Pain Score: 0-No pain     Nutritional Risks: None Diabetes: No  How often do you need to have someone help you when you read instructions, pamphlets, or other written materials from your doctor or pharmacy?: 1 - Never  Interpreter Needed?: No  Information entered by :: [facility], LPN  Past Medical History:  Diagnosis Date  . Anxiety   . Asthma   . GERD (gastroesophageal reflux disease)   . Headache    Migraines   . Hypertension   . Idiopathic thrombocytopenia purpura (HCC)    Required splenectomy  . MVP (mitral valve prolapse)   . Neuromuscular disorder (HCC)    Restless Leg Syndrome  . Pancreatitis April 2017 [facility] due to cholecystitis.   . Pneumonia    06-2015   Past Surgical History:  Procedure Laterality Date  . ABDOMINAL HYSTERECTOMY    . APPENDECTOMY    . CARPAL TUNNEL RELEASE Bilateral 11/19/2014   Procedure: CARPAL TUNNEL RELEASE;  Surgeon: Donato Heinz, MD;  Location: [facility] ORS;  Service: Orthopedics;  Laterality: Bilateral;  . CESAREAN SECTION     times 2  . CHOLECYSTECTOMY  09/29/2015   [facility]  . COLONOSCOPY N/A 05/02/2016   Procedure: COLONOSCOPY;  Surgeon: Scot Jun, MD;  Location: [facility] ENDOSCOPY;  Service: Endoscopy;  Laterality: N/A;  . ESOPHAGOGASTRODUODENOSCOPY (EGD) WITH PROPOFOL N/A 05/02/2016   Procedure: ESOPHAGOGASTRODUODENOSCOPY (EGD) WITH PROPOFOL;  Surgeon: Scot Jun, MD;  Location: [facility] ENDOSCOPY;  Service: Endoscopy;  Laterality: N/A;  . ESOPHAGOGASTRODUODENOSCOPY (EGD) WITH PROPOFOL N/A 11/22/2017   Procedure: ESOPHAGOGASTRODUODENOSCOPY (EGD) WITH PROPOFOL;  Surgeon: Scot Jun, MD;  Location: [facility] ENDOSCOPY;  Service: Endoscopy;  Laterality: N/A;  . HERNIA REPAIR     times 3  . JOINT REPLACEMENT    . OVARIAN CYST SURGERY    . SHOULDER ARTHROSCOPY WITH ROTATOR CUFF REPAIR     times 2  . SPLENECTOMY, TOTAL    . TOTAL SHOULDER  ARTHROPLASTY Right 04/21/2017   Procedure: REVERSE SHOULDER ARTHROPLASTY;  Surgeon: Signa Kell, MD;  Location: [facility] ORS;  Service: Orthopedics;  Laterality: Right;  . TRIGGER FINGER RELEASE Right 11/19/2014   Procedure: RELEASE TRIGGER FINGER/A-1 PULLEY;  Surgeon: Donato Heinz, MD;  Location: [facility] ORS;  Service: Orthopedics;  Laterality: Right;  . TRIGGER FINGER RELEASE Right 08/12/2015   Procedure: RELEASE TRIGGER LONG FINGER;  Surgeon: Donato Heinz, MD;  Location: [facility] ORS;  Service: Orthopedics;  Laterality:  Right;   Family History  Problem Relation Age of Onset  . Hypertension Mother   . Renal Disease Father        ESRD with HD   Social History   Socioeconomic History  . Marital status: Married    Spouse name: Not on file  . Number of children: 2  . Years of education: Not on file  . Highest education level: Bachelor's degree (e.g., BA, AB, BS)  Occupational History  . Occupation: Retired  Engineer, production  . Financial resource strain: Not very hard  . Food insecurity    Worry: Never true    Inability: Never true  . Transportation needs    Medical: No    Non-medical: No  Tobacco Use  . Smoking status: Former Smoker    Packs/day: 1.00    Years: 10.00    Pack years: 10.00    Types: Cigarettes    Quit date: 11/10/1979    Years since quitting: 39.3  . Smokeless tobacco: Never Used  Substance and Sexual Activity  . Alcohol use: Yes    Alcohol/week: 0.0 standard drinks    Comment: occasional- wine  . Drug use: No  . Sexual activity: Never  Lifestyle  . Physical activity    Days per week: 0 days    Minutes per session: 0 min  . Stress: Only a little  Relationships  . Social Musician on phone: Patient refused    Gets together: Patient refused    Attends religious service: Patient refused    Active member of club or organization: Patient refused    Attends meetings of clubs or organizations: Patient refused    Relationship status: Patient refused  Other Topics Concern  . Not on file  Social History Narrative  . Not on file    Outpatient Encounter Medications as of 03/12/2019  Medication Sig  . albuterol (VENTOLIN HFA) 108 (90 Base) MCG/ACT inhaler Inhale 2 puffs into the lungs every 6 (six) hours as needed for wheezing. May substitute generic or equivalent brand name  . alendronate (FOSAMAX) 70 MG tablet Take 1 tablet (70 mg total) by mouth once a week.  Marland Kitchen amLODipine (NORVASC) 2.5 MG tablet Take 1 tablet (2.5 mg total) by mouth daily.  Marland Kitchen atenolol (TENORMIN)  50 MG tablet TAKE 1 TABLET BY MOUTH ONCE DAILY  . azelastine (ASTELIN) 0.1 % nasal spray Place 2 sprays into both nostrils daily.   Marland Kitchen EPINEPHrine 0.3 mg/0.3 mL IJ SOAJ injection as needed.  . famotidine (PEPCID) 20 MG tablet Take 1 tablet (20 mg total) by mouth 2 (two) times daily for 10 days. (Patient taking differently: Take 20 mg by mouth 2 (two) times daily. As needed)  . Fexofenadine HCl (ALLEGRA PO) Take 1 capsule by mouth daily.  . fluticasone (FLONASE) 50 MCG/ACT nasal spray Place 2 sprays into both nostrils daily.   . fluticasone (FLOVENT HFA) 44 MCG/ACT inhaler Inhale 1 puff into the lungs daily.  Marland Kitchen LORazepam (ATIVAN) 1 MG tablet  Take 0.5 tablets (0.5 mg total) by mouth every 6 (six) hours as needed for anxiety.  . montelukast (SINGULAIR) 10 MG tablet Take 10 mg by mouth at bedtime.   Marland Kitchen omeprazole (PRILOSEC) 40 MG capsule Take 40 mg by mouth daily.  . ondansetron (ZOFRAN-ODT) 4 MG disintegrating tablet TAKE 1 TABLET BY MOUTH EVERY 8 HOURS AS NEEDED UP TO 7 DAYS NAUSEA AND VOMITING  . rOPINIRole (REQUIP) 2 MG tablet 1/2 tablet at bedtime for 2 days, then increase to 1 tablet at bedtime for 5 days, then increase to 2 tablets at bedtime (Patient taking differently: Take 4 mg by mouth at bedtime. 1/2 tablet at bedtime for 2 days, then increase to 1 tablet at bedtime for 5 days, then increase to 2 tablets at bedtime)  . SUMAtriptan (IMITREX) 100 MG tablet TAKE 1 TABLET AT ONSET OF HEADACHE,MAY TAKE AN ADDITIONAL TABLET IN 2HRS IF NEEDED BUT MAX 2 TABLETS PER 24 HRS  . Vitamin D, Ergocalciferol, (DRISDOL) 50000 units CAPS capsule Take 1 capsule (50,000 Units total) by mouth every 7 (seven) days. Reported on 01/14/2016  . cholestyramine (QUESTRAN) 4 GM/DOSE powder Take 1 packet (4 g total) by mouth 2 (two) times daily with a meal. (Patient not taking: Reported on 08/13/2018)  . RABEprazole (ACIPHEX) 20 MG tablet Take 20 mg by mouth 2 (two) times daily.   Marland Kitchen triamcinolone ointment (KENALOG) 0.5 %  Apply 1 application topically 2 (two) times daily. (Patient not taking: Reported on 03/12/2019)   No facility-administered encounter medications on file as of 03/12/2019.     Activities of Daily Living In your present state of health, do you have any difficulty performing the following activities: 03/12/2019  Hearing? N  Vision? N  Comment Wears eye glasses daily.  Difficulty concentrating or making decisions? N  Walking or climbing stairs? N  Dressing or bathing? N  Doing errands, shopping? N  Preparing Food and eating ? N  Using the Toilet? N  In the past six months, have you accidently leaked urine? N  Do you have problems with loss of bowel control? N  Managing your Medications? N  Managing your Finances? N  Housekeeping or managing your Housekeeping? N  Some recent data might be hidden    Patient Care Team: Birdie Sons, MD as PCP - General (Family Medicine) Manya Silvas, MD as Consulting Physician (Gastroenterology) Leim Fabry, MD as Consulting Physician (Orthopedic Surgery) Tiajuana Amass, MD as Referring Physician (Allergy and Immunology) Estill Cotta, MD as Consulting Physician (Ophthalmology)    Assessment:   This is a routine wellness examination for Tuyet.  Exercise Activities and Dietary recommendations Current Exercise Habits: Home exercise routine, Type of exercise: walking(or rides a bicycle), Time (Minutes): 15, Frequency (Times/Week): 7, Weekly Exercise (Minutes/Week): 105, Intensity: Mild, Exercise limited by: orthopedic condition(s)  Goals    . DIET - INCREASE WATER INTAKE     Recommend increasing water intake to 4 glasses a day.        Fall Risk: Fall Risk  03/12/2019 08/13/2018 01/15/2018 01/12/2017 01/14/2016  Falls in the past year? 0 0 No No No  Number falls in past yr: 0 - - - -  Injury with Fall? 0 - - - -    FALL RISK PREVENTION PERTAINING TO THE HOME:  Any stairs in or around the home? Yes  If so, are there any without  handrails? No   Home free of loose throw rugs in walkways, pet beds, electrical cords, etc? Yes  Adequate lighting in your home to reduce risk of falls? Yes   ASSISTIVE DEVICES UTILIZED TO PREVENT FALLS:  Life alert? No  Use of a cane, walker or w/c? No  Grab bars in the bathroom? No  Shower chair or bench in shower? Yes  Elevated toilet seat or a handicapped toilet? No    TIMED UP AND GO:  Was the test performed? No .    Depression Screen PHQ 2/9 Scores 03/12/2019 08/13/2018 01/15/2018 01/12/2017  PHQ - 2 Score 0 0 0 0     Cognitive Function     6CIT Screen 03/12/2019 01/12/2017 01/12/2017  What Year? 0 points 0 points 0 points  What month? 0 points 0 points 0 points  What time? 0 points 0 points 0 points  Count back from 20 0 points 0 points 0 points  Months in reverse 0 points 0 points -  Repeat phrase 0 points 2 points -  Total Score 0 2 -    Immunization History  Administered Date(s) Administered  . HiB (PRP-T) 10/19/2016  . Meningococcal B, OMV 10/19/2016  . Meningococcal Mcv4o 10/19/2016  . Pneumococcal Conjugate-13 10/19/2016  . Pneumococcal Polysaccharide-23 04/14/2009, 01/15/2018  . Tdap 05/17/2011    Qualifies for Shingles Vaccine? Yes . Due for Shingrix. Education has been provided regarding the importance of this vaccine. Pt has been advised to call insurance company to determine out of pocket expense. Advised may also receive vaccine at local pharmacy or Health Dept. Verbalized acceptance and understanding.  Tdap: Up to date  Flu Vaccine: Due for Flu vaccine. Does the patient want to receive this vaccine today?  No .   Pneumococcal Vaccine: Completed series  Screening Tests Health Maintenance  Topic Date Due  . Hepatitis C Screening  21-Jun-1951  . MAMMOGRAM  09/24/2017  . DEXA SCAN  02/10/2018  . INFLUENZA VACCINE  01/26/2019  . TETANUS/TDAP  05/16/2021  . COLONOSCOPY  05/02/2026  . PNA vac Low Risk Adult  Completed    Cancer Screenings:   Colorectal Screening: Completed 05/02/16. Repeat every 10 years.  Mammogram: Completed 09/25/15. Ordered today. Pt provided with contact info and advised to call to schedule appt. Pt aware the office will call re: appt.  Bone Density: Completed 02/11/16. Results reflect OSTEOPOROSIS. Repeat every 2 years. Ordered today. Pt provided with contact info and advised to call to schedule appt. Pt aware the office will call re: appt.  Lung Cancer Screening: (Low Dose CT Chest recommended if Age 76-80 years, 30 pack-year currently smoking OR have quit w/in 15years.) does not qualify.   Additional Screening:  Hepatitis C Screening: does qualify; however would like to check with insurance about coverage first.   Vision Screening: Recommended annual ophthalmology exams for early detection of glaucoma and other disorders of the eye.  Dental Screening: Recommended annual dental exams for proper oral hygiene  Community Resource Referral:  CRR required this visit?  No       Plan:  I have personally reviewed and addressed the Medicare Annual Wellness questionnaire and have noted the following in the patient's chart:  A. Medical and social history B. Use of alcohol, tobacco or illicit drugs  C. Current medications and supplements D. Functional ability and status E.  Nutritional status F.  Physical activity G. Advance directives H. List of other physicians I.  Hospitalizations, surgeries, and ER visits in previous 12 months J.  Vitals K. Screenings such as hearing and vision if needed, cognitive and depression L. Referrals and appointments  In addition, I have reviewed and discussed with patient certain preventive protocols, quality metrics, and best practice recommendations. A written personalized care plan for preventive services as well as general preventive health recommendations were provided to patient. Nurse Health Advisor  Signed,    Guido Comp AltonMarkoski, [place]  4/09/81199/15/2020 Nurse Health  Advisor   Nurse Notes: Pt to get flu shot at next in office visit. Pt to check with insurance about Hep C lab order.

## 2019-03-12 ENCOUNTER — Other Ambulatory Visit: Payer: Self-pay

## 2019-03-12 ENCOUNTER — Ambulatory Visit (INDEPENDENT_AMBULATORY_CARE_PROVIDER_SITE_OTHER): Payer: Medicare Other

## 2019-03-12 DIAGNOSIS — Z1239 Encounter for other screening for malignant neoplasm of breast: Secondary | ICD-10-CM

## 2019-03-12 DIAGNOSIS — E2839 Other primary ovarian failure: Secondary | ICD-10-CM

## 2019-03-12 DIAGNOSIS — R05 Cough: Secondary | ICD-10-CM | POA: Diagnosis not present

## 2019-03-12 DIAGNOSIS — L299 Pruritus, unspecified: Secondary | ICD-10-CM | POA: Diagnosis not present

## 2019-03-12 DIAGNOSIS — J3089 Other allergic rhinitis: Secondary | ICD-10-CM | POA: Diagnosis not present

## 2019-03-12 DIAGNOSIS — J3081 Allergic rhinitis due to animal (cat) (dog) hair and dander: Secondary | ICD-10-CM | POA: Diagnosis not present

## 2019-03-12 DIAGNOSIS — J4530 Mild persistent asthma, uncomplicated: Secondary | ICD-10-CM | POA: Diagnosis not present

## 2019-03-12 DIAGNOSIS — Z0000 Encounter for general adult medical examination without abnormal findings: Secondary | ICD-10-CM | POA: Diagnosis not present

## 2019-03-12 DIAGNOSIS — J301 Allergic rhinitis due to pollen: Secondary | ICD-10-CM | POA: Diagnosis not present

## 2019-03-12 NOTE — Patient Instructions (Signed)
Heidi Hernandez , Thank you for taking time to come for your Medicare Wellness Visit. I appreciate your ongoing commitment to your health goals. Please review the following plan we discussed and let me know if I can assist you in the future.   Screening recommendations/referrals: Colonoscopy: Up to date, due 04/2026 Mammogram: Ordered today. Pt provided with contact info and advised to call to schedule appt. Bone Density: Ordered today. Pt aware the office will call re: appt. Recommended yearly ophthalmology/optometry visit for glaucoma screening and checkup Recommended yearly dental visit for hygiene and checkup  Vaccinations: Influenza vaccine: Currently due Pneumococcal vaccine: Completed series Tdap vaccine: Up to date, due 04/2021 Shingles vaccine: Pt declines today.     Advanced directives: Advance directive discussed with you today. Even though you declined this today please call our office should you change your mind and we can give you the proper paperwork for you to fill out.  Conditions/risks identified: Recommend increasing water intake to 6-8 8 oz glasses a day.  Next appointment: 03/29/19 with Dr Caryn Section for a follow up.    Preventive Care 3 Years and Older, Female Preventive care refers to lifestyle choices and visits with your health care provider that can promote health and wellness. What does preventive care include?  A yearly physical exam. This is also called an annual well check.  Dental exams once or twice a year.  Routine eye exams. Ask your health care provider how often you should have your eyes checked.  Personal lifestyle choices, including:  Daily care of your teeth and gums.  Regular physical activity.  Eating a healthy diet.  Avoiding tobacco and drug use.  Limiting alcohol use.  Practicing safe sex.  Taking low-dose aspirin every day.  Taking vitamin and mineral supplements as recommended by your health care provider. What happens during an  annual well check? The services and screenings done by your health care provider during your annual well check will depend on your age, overall health, lifestyle risk factors, and family history of disease. Counseling  Your health care provider may ask you questions about your:  Alcohol use.  Tobacco use.  Drug use.  Emotional well-being.  Home and relationship well-being.  Sexual activity.  Eating habits.  History of falls.  Memory and ability to understand (cognition).  Work and work Statistician.  Reproductive health. Screening  You may have the following tests or measurements:  Height, weight, and BMI.  Blood pressure.  Lipid and cholesterol levels. These may be checked every 5 years, or more frequently if you are over 58 years old.  Skin check.  Lung cancer screening. You may have this screening every year starting at age 39 if you have a 30-pack-year history of smoking and currently smoke or have quit within the past 15 years.  Fecal occult blood test (FOBT) of the stool. You may have this test every year starting at age 102.  Flexible sigmoidoscopy or colonoscopy. You may have a sigmoidoscopy every 5 years or a colonoscopy every 10 years starting at age 23.  Hepatitis C blood test.  Hepatitis B blood test.  Sexually transmitted disease (STD) testing.  Diabetes screening. This is done by checking your blood sugar (glucose) after you have not eaten for a while (fasting). You may have this done every 1-3 years.  Bone density scan. This is done to screen for osteoporosis. You may have this done starting at age 52.  Mammogram. This may be done every 1-2 years. Talk to your  health care provider about how often you should have regular mammograms. Talk with your health care provider about your test results, treatment options, and if necessary, the need for more tests. Vaccines  Your health care provider may recommend certain vaccines, such as:  Influenza  vaccine. This is recommended every year.  Tetanus, diphtheria, and acellular pertussis (Tdap, Td) vaccine. You may need a Td booster every 10 years.  Zoster vaccine. You may need this after age 38.  Pneumococcal 13-valent conjugate (PCV13) vaccine. One dose is recommended after age 45.  Pneumococcal polysaccharide (PPSV23) vaccine. One dose is recommended after age 80. Talk to your health care provider about which screenings and vaccines you need and how often you need them. This information is not intended to replace advice given to you by your health care provider. Make sure you discuss any questions you have with your health care provider. Document Released: 07/10/2015 Document Revised: 03/02/2016 Document Reviewed: 04/14/2015 Elsevier Interactive Patient Education  2017 [place] Prevention in the Home Falls can cause injuries. They can happen to people of all ages. There are many things you can do to make your home safe and to help prevent falls. What can I do on the outside of my home?  Regularly fix the edges of walkways and driveways and fix any cracks.  Remove anything that might make you trip as you walk through a door, such as a raised step or threshold.  Trim any bushes or trees on the path to your home.  Use bright outdoor lighting.  Clear any walking paths of anything that might make someone trip, such as rocks or tools.  Regularly check to see if handrails are loose or broken. Make sure that both sides of any steps have handrails.  Any raised decks and porches should have guardrails on the edges.  Have any leaves, snow, or ice cleared regularly.  Use sand or salt on walking paths during winter.  Clean up any spills in your garage right away. This includes oil or grease spills. What can I do in the bathroom?  Use night lights.  Install grab bars by the toilet and in the tub and shower. Do not use towel bars as grab bars.  Use non-skid mats or decals  in the tub or shower.  If you need to sit down in the shower, use a plastic, non-slip stool.  Keep the floor dry. Clean up any water that spills on the floor as soon as it happens.  Remove soap buildup in the tub or shower regularly.  Attach bath mats securely with double-sided non-slip rug tape.  Do not have throw rugs and other things on the floor that can make you trip. What can I do in the bedroom?  Use night lights.  Make sure that you have a light by your bed that is easy to reach.  Do not use any sheets or blankets that are too big for your bed. They should not hang down onto the floor.  Have a firm chair that has side arms. You can use this for support while you get dressed.  Do not have throw rugs and other things on the floor that can make you trip. What can I do in the kitchen?  Clean up any spills right away.  Avoid walking on wet floors.  Keep items that you use a lot in easy-to-reach places.  If you need to reach something above you, use a strong step stool that has a  grab bar.  Keep electrical cords out of the way.  Do not use floor polish or wax that makes floors slippery. If you must use wax, use non-skid floor wax.  Do not have throw rugs and other things on the floor that can make you trip. What can I do with my stairs?  Do not leave any items on the stairs.  Make sure that there are handrails on both sides of the stairs and use them. Fix handrails that are broken or loose. Make sure that handrails are as long as the stairways.  Check any carpeting to make sure that it is firmly attached to the stairs. Fix any carpet that is loose or worn.  Avoid having throw rugs at the top or bottom of the stairs. If you do have throw rugs, attach them to the floor with carpet tape.  Make sure that you have a light switch at the top of the stairs and the bottom of the stairs. If you do not have them, ask someone to add them for you. What else can I do to help  prevent falls?  Wear shoes that:  Do not have high heels.  Have rubber bottoms.  Are comfortable and fit you well.  Are closed at the toe. Do not wear sandals.  If you use a stepladder:  Make sure that it is fully opened. Do not climb a closed stepladder.  Make sure that both sides of the stepladder are locked into place.  Ask someone to hold it for you, if possible.  Clearly mark and make sure that you can see:  Any grab bars or handrails.  First and last steps.  Where the edge of each step is.  Use tools that help you move around (mobility aids) if they are needed. These include:  Canes.  Walkers.  Scooters.  Crutches.  Turn on the lights when you go into a dark area. Replace any light bulbs as soon as they burn out.  Set up your furniture so you have a clear path. Avoid moving your furniture around.  If any of your floors are uneven, fix them.  If there are any pets around you, be aware of where they are.  Review your medicines with your doctor. Some medicines can make you feel dizzy. This can increase your chance of falling. Ask your doctor what other things that you can do to help prevent falls. This information is not intended to replace advice given to you by your health care provider. Make sure you discuss any questions you have with your health care provider. Document Released: 04/09/2009 Document Revised: 11/19/2015 Document Reviewed: 07/18/2014 Elsevier Interactive Patient Education  2017 [employer].

## 2019-03-15 ENCOUNTER — Ambulatory Visit: Payer: Medicare Other | Admitting: Family Medicine

## 2019-03-18 NOTE — Telephone Encounter (Signed)
Patient wants to keep at 2 mg tablets. Will discuss with Dr. Caryn Section at next office visit.

## 2019-03-26 DIAGNOSIS — J3089 Other allergic rhinitis: Secondary | ICD-10-CM | POA: Diagnosis not present

## 2019-03-26 DIAGNOSIS — J3081 Allergic rhinitis due to animal (cat) (dog) hair and dander: Secondary | ICD-10-CM | POA: Diagnosis not present

## 2019-03-26 DIAGNOSIS — J301 Allergic rhinitis due to pollen: Secondary | ICD-10-CM | POA: Diagnosis not present

## 2019-03-27 ENCOUNTER — Other Ambulatory Visit: Payer: Self-pay | Admitting: Family Medicine

## 2019-03-27 DIAGNOSIS — M810 Age-related osteoporosis without current pathological fracture: Secondary | ICD-10-CM

## 2019-03-29 ENCOUNTER — Ambulatory Visit (INDEPENDENT_AMBULATORY_CARE_PROVIDER_SITE_OTHER): Payer: Medicare Other | Admitting: Family Medicine

## 2019-03-29 ENCOUNTER — Encounter: Payer: Self-pay | Admitting: Family Medicine

## 2019-03-29 ENCOUNTER — Other Ambulatory Visit: Payer: Self-pay

## 2019-03-29 VITALS — BP 122/72 | HR 56 | Temp 97.6°F | Resp 16 | Ht 63.5 in | Wt 143.0 lb

## 2019-03-29 DIAGNOSIS — M810 Age-related osteoporosis without current pathological fracture: Secondary | ICD-10-CM

## 2019-03-29 DIAGNOSIS — E559 Vitamin D deficiency, unspecified: Secondary | ICD-10-CM | POA: Diagnosis not present

## 2019-03-29 DIAGNOSIS — R350 Frequency of micturition: Secondary | ICD-10-CM | POA: Diagnosis not present

## 2019-03-29 DIAGNOSIS — Z23 Encounter for immunization: Secondary | ICD-10-CM

## 2019-03-29 DIAGNOSIS — B351 Tinea unguium: Secondary | ICD-10-CM

## 2019-03-29 DIAGNOSIS — G2581 Restless legs syndrome: Secondary | ICD-10-CM

## 2019-03-29 DIAGNOSIS — I10 Essential (primary) hypertension: Secondary | ICD-10-CM

## 2019-03-29 DIAGNOSIS — Z862 Personal history of diseases of the blood and blood-forming organs and certain disorders involving the immune mechanism: Secondary | ICD-10-CM

## 2019-03-29 DIAGNOSIS — E876 Hypokalemia: Secondary | ICD-10-CM | POA: Diagnosis not present

## 2019-03-29 LAB — POCT URINALYSIS DIPSTICK
Bilirubin, UA: NEGATIVE
Glucose, UA: NEGATIVE
Ketones, UA: NEGATIVE
Leukocytes, UA: NEGATIVE
Nitrite, UA: NEGATIVE
Protein, UA: NEGATIVE
Spec Grav, UA: 1.025 (ref 1.010–1.025)
Urobilinogen, UA: 0.2 E.U./dL
pH, UA: 6.5 (ref 5.0–8.0)

## 2019-03-29 MED ORDER — TERBINAFINE HCL 250 MG PO TABS: 500.0000 mg | ORAL_TABLET | Freq: Every day | ORAL | 3 refills | Status: DC

## 2019-03-29 NOTE — Progress Notes (Signed)
Patient: Heidi PaoCarol M Hernandez Female    DOB: 06/19/1951   68 y.o.   MRN: [id number] Visit Date: 03/29/2019  Today's Provider: Mila Merryonald Mieke Brinley, MD   Chief Complaint  Patient presents with  . restless legs  . Urinary Frequency   Subjective:   HPI Patient comes in today to discuss restless legs. She reports that she was changed from Mirapex to Requip in May due to side effects from Mirapex, but she reports that Requipt has not help as much. She has also felt very fatigued and has nausea "spells" since she has started the medication. Is taking 1/2 at 11am, 1/2 mid day and 1 at bedtime, but feels extremely fatigued for a few hours after taking medication.   She was off of vitamin d and fosamax for several months, but started back on them about a month ago.  BP Readings from Last 3 Encounters:  03/29/19 122/72  08/31/18 (!) 150/72  08/13/18 128/68   Wt Readings from Last 3 Encounters:  03/29/19 143 lb (64.9 kg)  08/31/18 131 lb (59.4 kg)  08/13/18 141 lb 3.2 oz (64 kg)   Urinary frequency Patient also mentions that she has been urinating more frequently for about 2 weeks. She denies burning on urination, low back pain, or low abdominal pain.   She also reports patches or yellow dystrophic nails of fingers and toes and requests treatment.  Allergies  Allergen Reactions  . Augmentin [Amoxicillin-Pot Clavulanate] Nausea And Vomiting  . Droperidol Other (See Comments)    "Locked jaw" Facial muscles locked "Locked jaw"  . Paroxetine Hcl Other (See Comments)    syncope Passed out   . Morphine Itching     Current Outpatient Medications:  .  albuterol (VENTOLIN HFA) 108 (90 Base) MCG/ACT inhaler, Inhale 2 puffs into the lungs every 6 (six) hours as needed for wheezing. May substitute generic or equivalent brand name, Disp: 18 g, Rfl: 1 .  alendronate (FOSAMAX) 70 MG tablet, TAKE ONE TABLET BY MOUTH EACH WEEK, ON AN EMPTY STOMACH BEFORE BREAKFAST WITH 8oz OF WATER AND REMAIN  UPRIGHT FOR :30, Disp: 12 tablet, Rfl: 4 .  amLODipine (NORVASC) 2.5 MG tablet, Take 1 tablet (2.5 mg total) by mouth daily., Disp: 90 tablet, Rfl: 3 .  atenolol (TENORMIN) 50 MG tablet, TAKE 1 TABLET BY MOUTH ONCE DAILY, Disp: 30 tablet, Rfl: 12 .  azelastine (ASTELIN) 0.1 % nasal spray, Place 2 sprays into both nostrils daily. , Disp: , Rfl:  .  EPINEPHrine 0.3 mg/0.3 mL IJ SOAJ injection, as needed., Disp: , Rfl:  .  Fexofenadine HCl (ALLEGRA PO), Take 1 capsule by mouth daily., Disp: , Rfl:  .  fluticasone (FLONASE) 50 MCG/ACT nasal spray, Place 2 sprays into both nostrils daily. , Disp: , Rfl:  .  fluticasone (FLOVENT HFA) 44 MCG/ACT inhaler, Inhale 1 puff into the lungs daily., Disp: , Rfl:  .  LORazepam (ATIVAN) 1 MG tablet, Take 0.5 tablets (0.5 mg total) by mouth every 6 (six) hours as needed for anxiety., Disp: 15 tablet, Rfl: 4 .  montelukast (SINGULAIR) 10 MG tablet, Take 10 mg by mouth at bedtime. , Disp: , Rfl:  .  omeprazole (PRILOSEC) 40 MG capsule, Take 40 mg by mouth daily., Disp: , Rfl:  .  ondansetron (ZOFRAN-ODT) 4 MG disintegrating tablet, TAKE 1 TABLET BY MOUTH EVERY 8 HOURS AS NEEDED UP TO 7 DAYS NAUSEA AND VOMITING, Disp: 20 tablet, Rfl: 3 .  RABEprazole (ACIPHEX) 20 MG tablet,  Take 20 mg by mouth 2 (two) times daily. , Disp: , Rfl:  .  rOPINIRole (REQUIP) 2 MG tablet, TAKE 1/2 TABLET BY MOUTH AT BEDTIME FOR 2 DAYS THEN INCREASE TO 1 TABAT BEDTIME FOR 5 DAYS THEN 2 TABLETS AT BEDTIME, Disp: 30 tablet, Rfl: 5 .  Vitamin D, Ergocalciferol, (DRISDOL) 1.25 MG (50000 UT) CAPS capsule, TAKE 1 CAPSULE BY MOUTH ONCE EVERY 7 DAYS, Disp: 12 capsule, Rfl: 4 .  cholestyramine (QUESTRAN) 4 GM/DOSE powder, Take 1 packet (4 g total) by mouth 2 (two) times daily with a meal. (Patient not taking: Reported on 08/13/2018), Disp: 378 g, Rfl: 12 .  famotidine (PEPCID) 20 MG tablet, Take 1 tablet (20 mg total) by mouth 2 (two) times daily for 10 days. (Patient taking differently: Take 20 mg by  mouth 2 (two) times daily. As needed), Disp: 20 tablet, Rfl: 0 .  SUMAtriptan (IMITREX) 100 MG tablet, TAKE 1 TABLET AT ONSET OF HEADACHE,MAY TAKE AN ADDITIONAL TABLET IN 2HRS IF NEEDED BUT MAX 2 TABLETS PER 24 HRS, Disp: 9 tablet, Rfl: 5 .  triamcinolone ointment (KENALOG) 0.5 %, Apply 1 application topically 2 (two) times daily. (Patient not taking: Reported on 03/12/2019), Disp: 30 g, Rfl: 0  Review of Systems  Constitutional: Positive for fatigue.  Genitourinary: Positive for frequency. Negative for difficulty urinating, dysuria, hematuria and urgency.  Musculoskeletal: Positive for arthralgias and myalgias.  Neurological: Negative for dizziness, light-headedness and headaches.    Social History   Tobacco Use  . Smoking status: Former Smoker    Packs/day: 1.00    Years: 10.00    Pack years: 10.00    Types: Cigarettes    Quit date: 11/10/1979    Years since quitting: 39.4  . Smokeless tobacco: Never Used  Substance Use Topics  . Alcohol use: Yes    Alcohol/week: 0.0 standard drinks    Comment: occasional- wine      Objective:   BP 122/72   Pulse (!) 56   Temp 97.6 F (36.4 C)   Resp 16   Ht 5' 3.5" (1.613 m)   Wt 143 lb (64.9 kg)   SpO2 96%   BMI 24.93 kg/m  Vitals:   03/29/19 1618  BP: 122/72  Pulse: (!) 56  Resp: 16  Temp: 97.6 F (36.4 C)  SpO2: 96%  Weight: 143 lb (64.9 kg)  Height: 5' 3.5" (1.613 m)  Body mass index is 24.93 kg/m.   Physical Exam   General Appearance:    Well developed, well nourished female in no acute distress  Eyes:    PERRL, conjunctiva/corneas clear, EOM's intact       Lungs:     Clear to auscultation bilaterally, respirations unlabored  Heart:    Bradycardic. Normal rhythm.  No murmurs, rubs, or gallops.   MS:   All extremities are intact.   Neurologic:   Awake, alert, oriented x 3. No apparent focal neurological           defect.        Results for orders placed or performed in visit on 03/29/19  POCT urinalysis  dipstick  Result Value Ref Range   Color, UA yellow    Clarity, UA clear    Glucose, UA Negative Negative   Bilirubin, UA negative    Ketones, UA negative    Spec Grav, UA 1.025 1.010 - 1.025   Blood, UA non hemolyzed trace    pH, UA 6.5 5.0 - 8.0   Protein, UA Negative  Negative   Urobilinogen, UA 0.2 0.2 or 1.0 E.U./dL   Nitrite, UA negative    Leukocytes, UA Negative Negative       Assessment & Plan    1. Urinary frequency Normal u/a  2. Essential hypertension Well controlled.  Continue current medications.   - TSH  3. Vitamin D deficiency  - VITAMIN D 25 Hydroxy (Vit-D Deficiency, Fractures)  4. Hypokalemia  - Comprehensive metabolic panel - Magnesium  5. Osteoporosis without current pathological fracture, unspecified osteoporosis type Taking alendronate consistently and tolerating well.   6. History of ITP  - Ferritin - CBC  7. Restless legs syndrome Did not tolerate therapeutic dose of Mirapex and feels Ropinirole is no longer effective, and is concerned it may be causing her fatigue.  - Ferritin  Consider change to gabapentin if labs normal.   8. Onychomycosis start- terbinafine (LAMISIL) 250 MG tablet; Take 2 tablets (500 mg total) by mouth daily. Take for 1 week out of every 4 weeks  Dispense: 14 tablet; Refill: 3  9. Need for influenza vaccination  - Flu Vaccine QUAD High Dose(Fluad)  The entirety of the information documented in the History of Present Illness, Review of Systems and Physical Exam were personally obtained by me. Portions of this information were initially documented by Wilburt Finlay, CMA and reviewed by me for thoroughness and accuracy.      Lelon Huh, MD  [facility]

## 2019-03-29 NOTE — Patient Instructions (Addendum)
.   Please review the attached list of medications and notify my office if there are any errors.   . Please bring all of your medications to every appointment so we can make sure that our medication list is the same as yours.   IPlease go to the lab draw station in [address] on the second floor of [facility] . Normal hours are 8:00am to 12:30pm and 1:30pm to 4:00pm Monday through Friday

## 2019-04-01 DIAGNOSIS — G2581 Restless legs syndrome: Secondary | ICD-10-CM | POA: Diagnosis not present

## 2019-04-01 DIAGNOSIS — I10 Essential (primary) hypertension: Secondary | ICD-10-CM | POA: Diagnosis not present

## 2019-04-01 DIAGNOSIS — E559 Vitamin D deficiency, unspecified: Secondary | ICD-10-CM | POA: Diagnosis not present

## 2019-04-01 DIAGNOSIS — E876 Hypokalemia: Secondary | ICD-10-CM | POA: Diagnosis not present

## 2019-04-01 DIAGNOSIS — Z862 Personal history of diseases of the blood and blood-forming organs and certain disorders involving the immune mechanism: Secondary | ICD-10-CM | POA: Diagnosis not present

## 2019-04-02 ENCOUNTER — Telehealth: Payer: Self-pay

## 2019-04-02 LAB — COMPREHENSIVE METABOLIC PANEL
ALT: 17 IU/L (ref 0–32)
AST: 23 IU/L (ref 0–40)
Albumin/Globulin Ratio: 1.8 (ref 1.2–2.2)
Albumin: 4.5 g/dL (ref 3.8–4.8)
Alkaline Phosphatase: 73 IU/L (ref 39–117)
BUN/Creatinine Ratio: 23 (ref 12–28)
BUN: 16 mg/dL (ref 8–27)
Bilirubin Total: 0.4 mg/dL (ref 0.0–1.2)
CO2: 21 mmol/L (ref 20–29)
Calcium: 9.9 mg/dL (ref 8.7–10.3)
Chloride: 105 mmol/L (ref 96–106)
Creatinine, Ser: 0.69 mg/dL (ref 0.57–1.00)
GFR calc Af Amer: 102 mL/min/{1.73_m2} (ref >59)
GFR calc non Af Amer: 88 mL/min/{1.73_m2} (ref >59)
Globulin, Total: 2.5 g/dL (ref 1.5–4.5)
Glucose: 112 mg/dL — ABNORMAL HIGH (ref 65–99)
Potassium: 3.9 mmol/L (ref 3.5–5.2)
Sodium: 144 mmol/L (ref 134–144)
Total Protein: 7 g/dL (ref 6.0–8.5)

## 2019-04-02 LAB — CBC
Hematocrit: 43 % (ref 34.0–46.6)
Hemoglobin: 14.7 g/dL (ref 11.1–15.9)
MCH: 32.4 pg (ref 26.6–33.0)
MCHC: 34.2 g/dL (ref 31.5–35.7)
MCV: 95 fL (ref 79–97)
Platelets: 136 10*3/uL — ABNORMAL LOW (ref 150–450)
RBC: 4.54 x10E6/uL (ref 3.77–5.28)
RDW: 13.2 % (ref 11.7–15.4)
WBC: 9.7 10*3/uL (ref 3.4–10.8)

## 2019-04-02 LAB — FERRITIN: Ferritin: 100 ng/mL (ref 15–150)

## 2019-04-02 LAB — TSH: TSH: 1.15 u[IU]/mL (ref 0.450–4.500)

## 2019-04-02 LAB — VITAMIN D 25 HYDROXY (VIT D DEFICIENCY, FRACTURES): Vit D, 25-Hydroxy: 31.1 ng/mL (ref 30.0–100.0)

## 2019-04-02 LAB — MAGNESIUM: Magnesium: 2 mg/dL (ref 1.6–2.3)

## 2019-04-02 NOTE — Telephone Encounter (Signed)
Patient was advised and states that she discussed this option in office with Dr. Caryn Section and states that it will make no difference. Patient states that if she decides to change her mind she will call us back. KW

## 2019-04-02 NOTE — Telephone Encounter (Signed)
Patient was advised but states that she is not feeling well and states that when she addressed with you her problem she states you mentioned that the ropinirole could be causing her to feel the way she does. Patient wanted to know since her labs were normal and she still dosent feel well was there anything else she could do?

## 2019-04-02 NOTE — Telephone Encounter (Signed)
-----   Message from Birdie Sons, MD sent at 04/02/2019  7:52 AM EDT ----- Labs are all normal. Continue current medications.  Check yearly.

## 2019-04-02 NOTE — Telephone Encounter (Signed)
We can try changing from ropinirole to gabapentin 100mg  tablets , one every morning and two every evening, #90 rf x 1. Follow up in one month.

## 2019-04-04 DIAGNOSIS — J3081 Allergic rhinitis due to animal (cat) (dog) hair and dander: Secondary | ICD-10-CM | POA: Diagnosis not present

## 2019-04-04 DIAGNOSIS — J301 Allergic rhinitis due to pollen: Secondary | ICD-10-CM | POA: Diagnosis not present

## 2019-04-04 DIAGNOSIS — J3089 Other allergic rhinitis: Secondary | ICD-10-CM | POA: Diagnosis not present

## 2019-04-09 DIAGNOSIS — J3089 Other allergic rhinitis: Secondary | ICD-10-CM | POA: Diagnosis not present

## 2019-04-09 DIAGNOSIS — J301 Allergic rhinitis due to pollen: Secondary | ICD-10-CM | POA: Diagnosis not present

## 2019-04-09 DIAGNOSIS — J3081 Allergic rhinitis due to animal (cat) (dog) hair and dander: Secondary | ICD-10-CM | POA: Diagnosis not present

## 2019-04-10 DIAGNOSIS — J3089 Other allergic rhinitis: Secondary | ICD-10-CM | POA: Diagnosis not present

## 2019-04-19 DIAGNOSIS — J3081 Allergic rhinitis due to animal (cat) (dog) hair and dander: Secondary | ICD-10-CM | POA: Diagnosis not present

## 2019-04-19 DIAGNOSIS — J301 Allergic rhinitis due to pollen: Secondary | ICD-10-CM | POA: Diagnosis not present

## 2019-04-19 DIAGNOSIS — J3089 Other allergic rhinitis: Secondary | ICD-10-CM | POA: Diagnosis not present

## 2019-04-24 ENCOUNTER — Emergency Department
Admission: EM | Admit: 2019-04-24 | Discharge: 2019-04-24 | Disposition: A | Discharge status: Home / Self Care | Payer: Medicare Other | Attending: Emergency Medicine | Admitting: Emergency Medicine

## 2019-04-24 ENCOUNTER — Emergency Department: Payer: Medicare Other

## 2019-04-24 ENCOUNTER — Other Ambulatory Visit: Payer: Self-pay

## 2019-04-24 DIAGNOSIS — D693 Immune thrombocytopenic purpura: Secondary | ICD-10-CM | POA: Diagnosis not present

## 2019-04-24 DIAGNOSIS — J45909 Unspecified asthma, uncomplicated: Secondary | ICD-10-CM | POA: Diagnosis not present

## 2019-04-24 DIAGNOSIS — R0789 Other chest pain: Secondary | ICD-10-CM | POA: Diagnosis not present

## 2019-04-24 DIAGNOSIS — I10 Essential (primary) hypertension: Secondary | ICD-10-CM | POA: Diagnosis not present

## 2019-04-24 DIAGNOSIS — Z87891 Personal history of nicotine dependence: Secondary | ICD-10-CM | POA: Diagnosis not present

## 2019-04-24 DIAGNOSIS — K2100 Gastro-esophageal reflux disease with esophagitis, without bleeding: Secondary | ICD-10-CM | POA: Diagnosis not present

## 2019-04-24 DIAGNOSIS — R140 Abdominal distension (gaseous): Secondary | ICD-10-CM | POA: Diagnosis not present

## 2019-04-24 DIAGNOSIS — R079 Chest pain, unspecified: Secondary | ICD-10-CM | POA: Diagnosis not present

## 2019-04-24 DIAGNOSIS — K7689 Other specified diseases of liver: Secondary | ICD-10-CM | POA: Diagnosis not present

## 2019-04-24 DIAGNOSIS — R1310 Dysphagia, unspecified: Secondary | ICD-10-CM | POA: Diagnosis not present

## 2019-04-24 DIAGNOSIS — I700 Atherosclerosis of aorta: Secondary | ICD-10-CM | POA: Diagnosis not present

## 2019-04-24 LAB — CBC
HCT: 41.4 % (ref 36.0–46.0)
Hemoglobin: 14.3 g/dL (ref 12.0–15.0)
MCH: 32 pg (ref 26.0–34.0)
MCHC: 34.5 g/dL (ref 30.0–36.0)
MCV: 92.6 fL (ref 80.0–100.0)
Platelets: 156 10*3/uL (ref 150–400)
RBC: 4.47 MIL/uL (ref 3.87–5.11)
RDW: 14.3 % (ref 11.5–15.5)
WBC: 9.6 10*3/uL (ref 4.0–10.5)
nRBC: 0 % (ref 0.0–0.2)

## 2019-04-24 LAB — BASIC METABOLIC PANEL
Anion gap: 10 (ref 5–15)
BUN: 20 mg/dL (ref 8–23)
CO2: 25 mmol/L (ref 22–32)
Calcium: 9.9 mg/dL (ref 8.9–10.3)
Chloride: 106 mmol/L (ref 98–111)
Creatinine, Ser: 0.52 mg/dL (ref 0.44–1.00)
GFR calc Af Amer: >60 mL/min (ref >60)
GFR calc non Af Amer: >60 mL/min (ref >60)
Glucose, Bld: 108 mg/dL — ABNORMAL HIGH (ref 70–99)
Potassium: 3.9 mmol/L (ref 3.5–5.1)
Sodium: 141 mmol/L (ref 135–145)

## 2019-04-24 LAB — TROPONIN I (HIGH SENSITIVITY)
Troponin I (High Sensitivity): 4 ng/L (ref <18)
Troponin I (High Sensitivity): 5 ng/L (ref <18)

## 2019-04-24 MED ORDER — IOHEXOL 350 MG/ML SOLN
100.0000 mL | Freq: Once | INTRAVENOUS | Status: AC | PRN
  Administered 2019-04-24: 16:00:00 100 mL via INTRAVENOUS

## 2019-04-24 NOTE — Discharge Instructions (Addendum)
Follow-up with your gastroenterologist to decide on a further treatment plan for your chest discomfort.  Return to the ER for new, worsening, or persistent severe chest pain, difficulty breathing, weakness, vomiting, or any other new or worsening symptoms that concern you.  Your CT scan shows slight ectasia of your ascending aorta which means a dilation larger than the normal diameter.  It is 3.3 cm.  This needs to be followed up with a CT or MRI scan annually.  You should tell your primary care doctor about this to arrange for these future monitoring tests.

## 2019-04-24 NOTE — ED Triage Notes (Signed)
Reports intermittent chest pain over last 4 days. Reports pain is sharp on left side and radiates to back. Denies nausea, diaphoresis of SOB when pain occurs. Pt alert and oriented X4, cooperative, RR even and unlabored, color WNL. Pt in NAD.

## 2019-04-24 NOTE — ED Provider Notes (Signed)
[facility] Emergency Department Provider Note ____________________________________________   First MD Initiated Contact with Patient 04/24/19 1510     (approximate)  I have reviewed the triage vital signs and the nursing notes.   HISTORY  Chief Complaint Chest Pain    HPI Heidi Hernandez is a 68 y.o. female with PMH as noted below including GERD and Barrett's esophagus who presents with chest pain, intermittent course over approximately the last 4 days, and described as sharp and substernal.  It sometimes radiates to her back and to her shoulder blade.  She reports some associated nausea but denies shortness of breath, lightheadedness, or vomiting.  The patient states that she saw her gastroenterologist today, who sent her to the emergency department for further evaluation and for cardiac rule out.  The patient also reports a family history of an aortic aneurysm or dissection.  Past Medical History:  Diagnosis Date  . Anxiety   . Asthma   . GERD (gastroesophageal reflux disease)   . Headache    Migraines  . Hypertension   . Idiopathic thrombocytopenia purpura (HCC)    Required splenectomy  . MVP (mitral valve prolapse)   . Neuromuscular disorder (HCC)    Restless Leg Syndrome  . Pancreatitis April 2017 [facility] due to cholecystitis.   . Pneumonia    06-2015    Patient Active Problem List   Diagnosis Date Noted  . Acquired immunocompromised state (HCC) 08/14/2018  . Status post shoulder replacement 04/21/2017  . Rupture of right distal biceps tendon 01/13/2017  . Gallstone pancreatitis 10/15/2015  . Chondrocalcinosis of left ankle 12/24/2014  . Allergic rhinitis 12/23/2014  . Anxiety 12/23/2014  . Asplenia after surgical procedure 12/23/2014  . History of ITP 12/23/2014  . OP (osteoporosis) 12/23/2014  . Restless legs syndrome 12/23/2014  . Neuralgia neuritis, sciatic nerve 12/23/2014  . Actinic keratosis 12/23/2014  . Mitral valve  prolapse 12/23/2014  . LVH (left ventricular hypertrophy) 12/23/2014  . Carpal tunnel syndrome 11/19/2014  . Trigger finger, acquired 11/19/2014  . Vitamin D deficiency 09/28/2012  . Dysthymia 04/14/2009  . Hypertension 04/14/2009  . Headache, migraine 04/14/2009  . Artificial menopause 04/14/2009  . Insomnia 04/14/2009  . Cardiac murmur 04/14/2009    Past Surgical History:  Procedure Laterality Date  . ABDOMINAL HYSTERECTOMY    . APPENDECTOMY    . CARPAL TUNNEL RELEASE Bilateral 11/19/2014   Procedure: CARPAL TUNNEL RELEASE;  Surgeon: Donato Heinz, MD;  Location: [facility] ORS;  Service: Orthopedics;  Laterality: Bilateral;  . CESAREAN SECTION     times 2  . CHOLECYSTECTOMY  09/29/2015   [facility]  . COLONOSCOPY N/A 05/02/2016   Procedure: COLONOSCOPY;  Surgeon: Scot Jun, MD;  Location: [facility] ENDOSCOPY;  Service: Endoscopy;  Laterality: N/A;  . ESOPHAGOGASTRODUODENOSCOPY (EGD) WITH PROPOFOL N/A 05/02/2016   Procedure: ESOPHAGOGASTRODUODENOSCOPY (EGD) WITH PROPOFOL;  Surgeon: Scot Jun, MD;  Location: [facility] ENDOSCOPY;  Service: Endoscopy;  Laterality: N/A;  . ESOPHAGOGASTRODUODENOSCOPY (EGD) WITH PROPOFOL N/A 11/22/2017   Procedure: ESOPHAGOGASTRODUODENOSCOPY (EGD) WITH PROPOFOL;  Surgeon: Scot Jun, MD;  Location: [facility] ENDOSCOPY;  Service: Endoscopy;  Laterality: N/A;  . HERNIA REPAIR     times 3  . JOINT REPLACEMENT    . OVARIAN CYST SURGERY    . SHOULDER ARTHROSCOPY WITH ROTATOR CUFF REPAIR     times 2  . SPLENECTOMY, TOTAL    . TOTAL SHOULDER ARTHROPLASTY Right 04/21/2017   Procedure: REVERSE SHOULDER ARTHROPLASTY;  Surgeon: Signa Kell, MD;  Location: [facility] ORS;  Service: Orthopedics;  Laterality: Right;  . TRIGGER FINGER RELEASE Right 11/19/2014   Procedure: RELEASE TRIGGER FINGER/A-1 PULLEY;  Surgeon: Donato Heinz, MD;  Location: [facility] ORS;  Service: Orthopedics;  Laterality: Right;  . TRIGGER FINGER RELEASE Right 08/12/2015   Procedure: RELEASE TRIGGER LONG  FINGER;  Surgeon: Donato Heinz, MD;  Location: [facility] ORS;  Service: Orthopedics;  Laterality: Right;    Prior to Admission medications   Medication Sig Start Date End Date Taking? Authorizing Provider  albuterol (VENTOLIN HFA) 108 (90 Base) MCG/ACT inhaler Inhale 2 puffs into the lungs every 6 (six) hours as needed for wheezing. May substitute generic or equivalent brand name 07/07/15   Malva Limes, MD  alendronate (FOSAMAX) 70 MG tablet TAKE ONE TABLET BY MOUTH EACH WEEK, ON AN EMPTY STOMACH BEFORE BREAKFAST WITH 8oz OF WATER AND REMAIN UPRIGHT FOR :30 03/27/19   Malva Limes, MD  amLODipine (NORVASC) 2.5 MG tablet Take 1 tablet (2.5 mg total) by mouth daily. 11/06/17   Malva Limes, MD  atenolol (TENORMIN) 50 MG tablet TAKE 1 TABLET BY MOUTH ONCE DAILY 03/12/18   Malva Limes, MD  azelastine (ASTELIN) 0.1 % nasal spray Place 2 sprays into both nostrils daily.  12/24/13   [provider]  EPINEPHrine 0.3 mg/0.3 mL IJ SOAJ injection as needed. 12/25/18   [provider]  famotidine (PEPCID) 20 MG tablet Take 1 tablet (20 mg total) by mouth 2 (two) times daily for 10 days. Patient taking differently: Take 20 mg by mouth 2 (two) times daily. As needed 08/31/18 03/12/19  Dionne Bucy, MD  Fexofenadine HCl (ALLEGRA PO) Take 1 capsule by mouth daily.    [provider]  fluticasone (FLONASE) 50 MCG/ACT nasal spray Place 2 sprays into both nostrils daily.  12/05/14   [provider]  fluticasone (FLOVENT HFA) 44 MCG/ACT inhaler Inhale 1 puff into the lungs daily.    [provider]  LORazepam (ATIVAN) 1 MG tablet Take 0.5 tablets (0.5 mg total) by mouth every 6 (six) hours as needed for anxiety. 10/12/18   Malva Limes, MD  montelukast (SINGULAIR) 10 MG tablet Take 10 mg by mouth at bedtime.  12/24/13   [provider]  omeprazole (PRILOSEC) 40 MG capsule Take 40 mg by mouth daily. 11/22/17   [provider]  ondansetron  (ZOFRAN-ODT) 4 MG disintegrating tablet TAKE 1 TABLET BY MOUTH EVERY 8 HOURS AS NEEDED UP TO 7 DAYS NAUSEA AND VOMITING 09/14/18   Malva Limes, MD  RABEprazole (ACIPHEX) 20 MG tablet Take 20 mg by mouth 2 (two) times daily.  06/01/16   [provider]  rOPINIRole (REQUIP) 2 MG tablet TAKE 1/2 TABLET BY MOUTH AT BEDTIME FOR 2 DAYS THEN INCREASE TO 1 TABAT BEDTIME FOR 5 DAYS THEN 2 TABLETS AT BEDTIME 03/18/19   Malva Limes, MD  SUMAtriptan (IMITREX) 100 MG tablet TAKE 1 TABLET AT ONSET OF HEADACHE,MAY TAKE AN ADDITIONAL TABLET IN 2HRS IF NEEDED BUT MAX 2 TABLETS PER 24 HRS 01/29/18   Malva Limes, MD  terbinafine (LAMISIL) 250 MG tablet Take 2 tablets (500 mg total) by mouth daily. Take for 1 week out of every 4 weeks 03/29/19   Malva Limes, MD  Vitamin D, Ergocalciferol, (DRISDOL) 1.25 MG (50000 UT) CAPS capsule TAKE 1 CAPSULE BY MOUTH ONCE EVERY 7 DAYS 03/27/19   Malva Limes, MD    Allergies Augmentin [amoxicillin-pot clavulanate], Droperidol, Paroxetine hcl, and  Morphine  Family History  Problem Relation Age of Onset  . Hypertension Mother   . Renal Disease Father        ESRD with HD    Social History Social History   Tobacco Use  . Smoking status: Former Smoker    Packs/day: 1.00    Years: 10.00    Pack years: 10.00    Types: Cigarettes    Quit date: 11/10/1979    Years since quitting: 39.4  . Smokeless tobacco: Never Used  Substance Use Topics  . Alcohol use: Yes    Alcohol/week: 0.0 standard drinks    Comment: occasional- wine  . Drug use: No    Review of Systems  Constitutional: No fever. Eyes: No redness. ENT: No sore throat. Cardiovascular: Positive for chest pain. Respiratory: Denies shortness of breath. Gastrointestinal: No vomiting or diarrhea. Genitourinary: Negative for flank pain.  Musculoskeletal: Positive for back pain. Skin: Negative for rash. Neurological: Negative for headaches, focal weakness or numbness.    ____________________________________________   PHYSICAL EXAM:  VITAL SIGNS: ED Triage Vitals [04/24/19 1004]  Enc Vitals Group     BP (!) 184/68     Pulse Rate 64     Resp 18     Temp 99.1 F (37.3 C)     Temp Source Oral     SpO2 100 %     Weight 141 lb 1.5 oz (64 kg)     Height 5\' 6"  (1.676 m)     Head Circumference      Peak Flow      Pain Score 4     Pain Loc      Pain Edu?      Excl. in [place]?     Constitutional: Alert and oriented. Well appearing and in no acute distress. Eyes: Conjunctivae are normal.  Head: Atraumatic. Nose: No congestion/rhinnorhea. Mouth/Throat: Mucous membranes are moist.   Neck: Normal range of motion.  Cardiovascular: Normal rate, regular rhythm. Grossly normal heart sounds.  Good peripheral circulation. Respiratory: Normal respiratory effort.  No retractions. Lungs CTAB. Gastrointestinal: Soft and nontender. No distention.  Genitourinary: No flank tenderness. Musculoskeletal: No lower extremity edema.  No calf or popliteal swelling or tenderness.  Extremities warm and well perfused.  Neurologic:  Normal speech and language. No gross focal neurologic deficits are appreciated.  Skin:  Skin is warm and dry. No rash noted. Psychiatric: Mood and affect are normal. Speech and behavior are normal.  ____________________________________________   LABS (all labs ordered are listed, but only abnormal results are displayed)  Labs Reviewed  BASIC METABOLIC PANEL - Abnormal; Notable for the following components:      Result Value   Glucose, Bld 108 (*)    All other components within normal limits  CBC  TROPONIN I (HIGH SENSITIVITY)  TROPONIN I (HIGH SENSITIVITY)   ____________________________________________  EKG  ED ECG REPORT I, Arta Silence, the attending physician, personally viewed and interpreted this ECG.  Date: 04/24/2019 EKG Time: 0959 Rate: 66 Rhythm: normal sinus rhythm QRS Axis: Left axis Intervals: normal ST/T Wave  abnormalities: normal Narrative Interpretation: no evidence of acute ischemia  ____________________________________________  RADIOLOGY  CXR: No focal infiltrate or other acute abnormality CT angio chest/abdomen/pelvis: No acute abnormality.  Ectasia of the ascending aorta to 3.3 cm.  ____________________________________________   PROCEDURES  Procedure(s) performed: No  Procedures  Critical Care performed: No ____________________________________________   INITIAL IMPRESSION / ASSESSMENT AND PLAN / ED COURSE  Pertinent labs & imaging results that were available  during my care of the patient were reviewed by me and considered in my medical decision making (see chart for details).  68 year old female with PMH as noted above presents with atypical intermittent chest pain over the last several days sometimes associated with nausea.  Radiates to her back and shoulder.  The patient believes it is indigestion or related to her Barrett's esophagus.  She was seeing her gastroenterologist today, who instructed her to come to the ER for further evaluation and cardiac rule out.  The patient also states that she has a family history of aortic aneurysm or dissection.  On exam, she is very well-appearing.  Her vital signs are normal except for hypertension.  The remainder of the physical exam is unremarkable.  EKG is nonischemic.  Overall I suspect most likely GI etiology such as GERD or mild esophagitis.  Given the patient's age we will obtain cardiac enzymes x2.  I have a lower suspicion for aortic dissection or aneurysm given the overall well appearance, however with the radiation to the back and the elevated blood pressure we will rule this out with a CT angiogram.  ----------------------------------------- 4:34 PM on 04/24/2019 -----------------------------------------  Initial and repeat troponin are both negative.  CT angio shows no acute abnormality.  I informed the patient about the  ectasia of her ascending aorta and the need for repeat CT or MRI annually.  At this time, the patient is comfortable appearing and is stable for discharge home.  She will follow-up with her gastroenterologist.  Return precautions given, and she expresses understanding. ____________________________________________   FINAL CLINICAL IMPRESSION(S) / ED DIAGNOSES  Final diagnoses:  Atypical chest pain      NEW MEDICATIONS STARTED DURING THIS VISIT:  New Prescriptions   No medications on file     Note:  This document was prepared using Dragon voice recognition software and may include unintentional dictation errors.   Dionne BucySiadecki, Ramiah Helfrich, MD 04/24/19 [id number]

## 2019-04-29 ENCOUNTER — Telehealth: Payer: Self-pay

## 2019-04-29 DIAGNOSIS — I77810 Thoracic aortic ectasia: Secondary | ICD-10-CM | POA: Insufficient documentation

## 2019-04-29 NOTE — Telephone Encounter (Signed)
We can refer for follow up and evaluation of chest pain.  The aorta is borderline enlarged. It will have to get much larger for their to be any risk of rupturing. Will need scan once a year. Main thing is to keep blood pressure under good control.

## 2019-04-29 NOTE — Telephone Encounter (Signed)
Patient advised. She would like a cardio referral for a follow up. Referral placed.

## 2019-04-29 NOTE — Telephone Encounter (Signed)
Pt stated that she recently went to [facility] ED and was advised that her aorta was swelling. Pt stated her mother passed away from an aortic aneurysm. Pt is concerned and wanted to see if Dr. Caryn Section could refer pt to see Dr. Saralyn Pilar with [facility]. Please advise. Thanks TNP

## 2019-04-30 ENCOUNTER — Ambulatory Visit
Admission: RE | Admit: 2019-04-30 | Discharge: 2019-04-30 | Disposition: A | Discharge status: Home / Self Care | Payer: Medicare Other | Source: Ambulatory Visit | Attending: Family Medicine | Admitting: Family Medicine

## 2019-04-30 DIAGNOSIS — Z1231 Encounter for screening mammogram for malignant neoplasm of breast: Secondary | ICD-10-CM | POA: Diagnosis not present

## 2019-04-30 DIAGNOSIS — E2839 Other primary ovarian failure: Secondary | ICD-10-CM | POA: Diagnosis not present

## 2019-04-30 DIAGNOSIS — Z780 Asymptomatic menopausal state: Secondary | ICD-10-CM | POA: Diagnosis not present

## 2019-04-30 DIAGNOSIS — Z13820 Encounter for screening for osteoporosis: Secondary | ICD-10-CM | POA: Diagnosis not present

## 2019-04-30 DIAGNOSIS — Z1239 Encounter for other screening for malignant neoplasm of breast: Secondary | ICD-10-CM

## 2019-04-30 DIAGNOSIS — M810 Age-related osteoporosis without current pathological fracture: Secondary | ICD-10-CM | POA: Diagnosis not present

## 2019-05-01 ENCOUNTER — Telehealth: Payer: Self-pay

## 2019-05-01 NOTE — Telephone Encounter (Signed)
-----   Message from Birdie Sons, MD sent at 04/30/2019  9:46 PM EST ----- Bone density test is stable from 2017. Continue current dose of alendronate. Repeat in 2 years.

## 2019-05-01 NOTE — Telephone Encounter (Signed)
Patient was advised.  

## 2019-05-02 DIAGNOSIS — I10 Essential (primary) hypertension: Secondary | ICD-10-CM | POA: Insufficient documentation

## 2019-05-02 DIAGNOSIS — R079 Chest pain, unspecified: Secondary | ICD-10-CM | POA: Diagnosis not present

## 2019-05-02 DIAGNOSIS — I341 Nonrheumatic mitral (valve) prolapse: Secondary | ICD-10-CM | POA: Diagnosis not present

## 2019-05-07 DIAGNOSIS — J301 Allergic rhinitis due to pollen: Secondary | ICD-10-CM | POA: Diagnosis not present

## 2019-05-07 DIAGNOSIS — J3081 Allergic rhinitis due to animal (cat) (dog) hair and dander: Secondary | ICD-10-CM | POA: Diagnosis not present

## 2019-05-07 DIAGNOSIS — J3089 Other allergic rhinitis: Secondary | ICD-10-CM | POA: Diagnosis not present

## 2019-05-13 DIAGNOSIS — R079 Chest pain, unspecified: Secondary | ICD-10-CM | POA: Diagnosis not present

## 2019-05-20 DIAGNOSIS — I341 Nonrheumatic mitral (valve) prolapse: Secondary | ICD-10-CM | POA: Diagnosis not present

## 2019-05-20 DIAGNOSIS — R079 Chest pain, unspecified: Secondary | ICD-10-CM | POA: Diagnosis not present

## 2019-05-20 DIAGNOSIS — I10 Essential (primary) hypertension: Secondary | ICD-10-CM | POA: Diagnosis not present

## 2019-05-21 ENCOUNTER — Other Ambulatory Visit: Payer: Self-pay | Admitting: Family Medicine

## 2019-05-21 DIAGNOSIS — J3089 Other allergic rhinitis: Secondary | ICD-10-CM | POA: Diagnosis not present

## 2019-05-21 DIAGNOSIS — J301 Allergic rhinitis due to pollen: Secondary | ICD-10-CM | POA: Diagnosis not present

## 2019-05-21 DIAGNOSIS — J3081 Allergic rhinitis due to animal (cat) (dog) hair and dander: Secondary | ICD-10-CM | POA: Diagnosis not present

## 2019-05-21 NOTE — Telephone Encounter (Signed)
Requested medication (s) are due for refill today: yes  Requested medication (s) are on the active medication list: yes  Last refill:  04/27/2019  Future visit scheduled: no  Notes to clinic:  Review for refill Looks like medication may have not been working for patient anymore    Requested Prescriptions  Pending Prescriptions Disp Refills   rOPINIRole (REQUIP) 2 MG tablet [Pharmacy Med Name: ROPINIROLE HCL 2 MG TAB] 30 tablet 5    Sig: TAKE 1/2 TABLET BY MOUTH AT BEDTIME FOR 2 DAYS THEN INCREASE TO 1 TABLET AT BEDTIME FOR 5 DAYS THEN 2 TABLETS AT BEDTIME     Neurology:  Parkinsonian Agents Failed - 05/21/2019 12:44 PM      Failed - Last BP in normal range    BP Readings from Last 1 Encounters:  04/24/19 (!) 186/97         Passed - Valid encounter within last 12 months    Recent Outpatient Visits          1 month ago Urinary frequency   [facility] Birdie Sons, MD   9 months ago Cough   [facility] Birdie Sons, MD   10 months ago Acute maxillary sinusitis, recurrence not specified   [facility] Birdie Sons, MD   1 year ago Cough   [facility] Birdie Sons, MD   1 year ago [place], Fabio Bering [place], [place]

## 2019-05-31 ENCOUNTER — Other Ambulatory Visit: Payer: Self-pay | Admitting: Family Medicine

## 2019-06-03 ENCOUNTER — Telehealth: Payer: Self-pay

## 2019-06-03 NOTE — Telephone Encounter (Signed)
Copied from [place] [phone]. Topic: Appointment Scheduling - Scheduling Inquiry for Clinic >> Jun 03, 2019  8:18 AM Oneta Rack wrote: Patient experiencing coughing no other symptoms since yesterday seeking an appt with PCP only today ( telephone call appt),please advise >> Jun 03, 2019  9:09 AM Greggory Keen D wrote: Pt called back to say she just had a covid test done and got her results this morning.   They were neg.  She has a temp of 100.

## 2019-06-03 NOTE — Telephone Encounter (Signed)
Patient scheduled for a telephone visit on 12/8

## 2019-06-03 NOTE — Telephone Encounter (Signed)
Copied from [place] [id number]. Topic: Appointment Scheduling - Scheduling Inquiry for Clinic >> Jun 03, 2019  8:18 AM Oneta Rack wrote: Patient experiencing coughing no other symptoms since yesterday seeking an appt with PCP only today ( telephone call appt),please advise

## 2019-06-04 ENCOUNTER — Other Ambulatory Visit: Payer: Self-pay

## 2019-06-04 ENCOUNTER — Ambulatory Visit (INDEPENDENT_AMBULATORY_CARE_PROVIDER_SITE_OTHER): Payer: Medicare Other | Admitting: Adult Health

## 2019-06-04 ENCOUNTER — Telehealth: Payer: Self-pay

## 2019-06-04 ENCOUNTER — Encounter: Payer: Self-pay | Admitting: Adult Health

## 2019-06-04 DIAGNOSIS — Z20822 Contact with and (suspected) exposure to covid-19: Secondary | ICD-10-CM

## 2019-06-04 DIAGNOSIS — J069 Acute upper respiratory infection, unspecified: Secondary | ICD-10-CM

## 2019-06-04 DIAGNOSIS — Z20828 Contact with and (suspected) exposure to other viral communicable diseases: Secondary | ICD-10-CM

## 2019-06-04 DIAGNOSIS — R05 Cough: Secondary | ICD-10-CM | POA: Diagnosis not present

## 2019-06-04 DIAGNOSIS — J40 Bronchitis, not specified as acute or chronic: Secondary | ICD-10-CM | POA: Diagnosis not present

## 2019-06-04 DIAGNOSIS — R059 Cough, unspecified: Secondary | ICD-10-CM

## 2019-06-04 MED ORDER — DOXYCYCLINE HYCLATE 100 MG PO TABS: 100.0000 mg | ORAL_TABLET | Freq: Two times a day (BID) | ORAL | 0 refills | Status: DC

## 2019-06-04 MED ORDER — PREDNISONE 10 MG (21) PO TBPK: ORAL_TABLET | ORAL | 0 refills | Status: DC

## 2019-06-04 MED ORDER — HYDROCOD POLST-CPM POLST ER 10-8 MG/5ML PO SUER: 5.0000 mL | Freq: Every evening | ORAL | 0 refills | Status: DC | PRN

## 2019-06-04 NOTE — Patient Instructions (Signed)
Upper Respiratory Infection, Adult An upper respiratory infection (URI) affects the nose, throat, and upper air passages. URIs are caused by germs (viruses). The most common type of URI is often called "the common cold." Medicines cannot cure URIs, but you can do things at home to relieve your symptoms. URIs usually get better within 7-10 days. Follow these instructions at home: Activity  Rest as needed.  If you have a fever, stay home from work or school until your fever is gone, or until your doctor says you may return to work or school. ? You should stay home until you cannot spread the infection anymore (you are not contagious). ? Your doctor may have you wear a face mask so you have less risk of spreading the infection. Relieving symptoms  Gargle with a salt-water mixture 3-4 times a day or as needed. To make a salt-water mixture, completely dissolve -1 tsp of salt in 1 cup of warm water.  Use a cool-mist humidifier to add moisture to the air. This can help you breathe more easily. Eating and drinking   Drink enough fluid to keep your pee (urine) pale yellow.  Eat soups and other clear broths. General instructions   Take over-the-counter and prescription medicines only as told by your doctor. These include cold medicines, fever reducers, and cough suppressants.  Do not use any products that contain nicotine or tobacco. These include cigarettes and e-cigarettes. If you need help quitting, ask your doctor.  Avoid being where people are smoking (avoid secondhand smoke).  Make sure you get regular shots and get the flu shot every year.  Keep all follow-up visits as told by your doctor. This is important. How to avoid spreading infection to others   Wash your hands often with soap and water. If you do not have soap and water, use hand sanitizer.  Avoid touching your mouth, face, eyes, or nose.  Cough or sneeze into a tissue or your sleeve or elbow. Do not cough or sneeze  into your hand or into the air. Contact a doctor if:  You are getting worse, not better.  You have any of these: ? A fever. ? Chills. ? Brown or red mucus in your nose. ? Yellow or brown fluid (discharge)coming from your nose. ? Pain in your face, especially when you bend forward. ? Swollen neck glands. ? Pain with swallowing. ? White areas in the back of your throat. Get help right away if:  You have shortness of breath that gets worse.  You have very bad or constant: ? Headache. ? Ear pain. ? Pain in your forehead, behind your eyes, and over your cheekbones (sinus pain). ? Chest pain.  You have long-lasting (chronic) lung disease along with any of these: ? Wheezing. ? Long-lasting cough. ? Coughing up blood. ? A change in your usual mucus.  You have a stiff neck.  You have changes in your: ? Vision. ? Hearing. ? Thinking. ? Mood. Summary  An upper respiratory infection (URI) is caused by a germ called a virus. The most common type of URI is often called "the common cold."  URIs usually get better within 7-10 days.  Take over-the-counter and prescription medicines only as told by your doctor. This information is not intended to replace advice given to you by your health care provider. Make sure you discuss any questions you have with your health care provider. Document Released: 11/30/2007 Document Revised: 06/21/2018 Document Reviewed: 02/03/2017 Elsevier Patient Education  2020 [employer]. COVID-19  Frequently Asked Questions COVID-19 (coronavirus disease) is an infection that is caused by a large family of viruses. Some viruses cause illness in people and others cause illness in animals like camels, cats, and bats. In some cases, the viruses that cause illness in animals can spread to humans. Where did the coronavirus come from? In December 2019, [place] told the [facility]) of several cases of lung disease (human respiratory illness). These  cases were linked to an open seafood and livestock market in the city of [place]. The link to the seafood and livestock market suggests that the virus may have spread from animals to humans. However, since that first outbreak in December, the virus has also been shown to spread from person to person. What is the name of the disease and the virus? Disease name Early on, this disease was called novel coronavirus. This is because scientists determined that the disease was caused by a new (novel) respiratory virus. The World Health Organization [facility]) has now named the disease COVID-19, or coronavirus disease. Virus name The virus that causes the disease is called severe acute respiratory syndrome coronavirus 2 (SARS-CoV-2). More information on disease and virus naming World Health Organization [facility]): www.who.int/emergencies/diseases/novel-coronavirus-2019/technical-guidance/naming-the-coronavirus-disease-(covid-2019)-and-the-virus-that-causes-it Who is at risk for complications from coronavirus disease? Some people may be at higher risk for complications from coronavirus disease. This includes older adults and people who have chronic diseases, such as heart disease, diabetes, and lung disease. If you are at higher risk for complications, take these extra precautions:  Avoid close contact with people who are sick or have a fever or cough. Stay at least 3-6 ft (1-2 m) away from them, if possible.  Wash your hands often with soap and water for at least 20 seconds.  Avoid touching your face, mouth, nose, or eyes.  Keep supplies on hand at home, such as food, medicine, and cleaning supplies.  Stay home as much as possible.  Avoid social gatherings and travel. How does coronavirus disease spread? The virus that causes coronavirus disease spreads easily from person to person (is contagious). There are also cases of community-spread disease. This means the disease has spread to:  People who have no known  contact with other infected people.  People who have not traveled to areas where there are known cases. It appears to spread from one person to another through droplets from coughing or sneezing. Can I get the virus from touching surfaces or objects? There is still a lot that we do not know about the virus that causes coronavirus disease. Scientists are basing a lot of information on what they know about similar viruses, such as:  Viruses cannot generally survive on surfaces for long. They need a human body (host) to survive.  It is more likely that the virus is spread by close contact with people who are sick (direct contact), such as through: ? Shaking hands or hugging. ? Breathing in respiratory droplets that travel through the air. This can happen when an infected person coughs or sneezes on or near other people.  It is less likely that the virus is spread when a person touches a surface or object that has the virus on it (indirect contact). The virus may be able to enter the body if the person touches a surface or object and then touches his or her face, eyes, nose, or mouth. Can a person spread the virus without having symptoms of the disease? It may be possible for the virus to spread before a person  has symptoms of the disease, but this is most likely not the main way the virus is spreading. It is more likely for the virus to spread by being in close contact with people who are sick and breathing in the respiratory droplets of a sick person's cough or sneeze. What are the symptoms of coronavirus disease? Symptoms vary from person to person and can range from mild to severe. Symptoms may include:  Fever.  Cough.  Tiredness, weakness, or fatigue.  Fast breathing or feeling short of breath. These symptoms can appear anywhere from 2 to 14 days after you have been exposed to the virus. If you develop symptoms, call your health care provider. People with severe symptoms may need hospital  care. If I am exposed to the virus, how long does it take before symptoms start? Symptoms of coronavirus disease may appear anywhere from 2 to 14 days after a person has been exposed to the virus. If you develop symptoms, call your health care provider. Should I be tested for this virus? Your health care provider will decide whether to test you based on your symptoms, history of exposure, and your risk factors. How does a health care provider test for this virus? Health care providers will collect samples to send for testing. Samples may include:  Taking a swab of fluid from the nose.  Taking fluid from the lungs by having you cough up mucus (sputum) into a sterile cup.  Taking a blood sample.  Taking a stool or urine sample. Is there a treatment or vaccine for this virus? Currently, there is no vaccine to prevent coronavirus disease. Also, there are no medicines like antibiotics or antivirals to treat the virus. A person who becomes sick is given supportive care, which means rest and fluids. A person may also relieve his or her symptoms by using over-the-counter medicines that treat sneezing, coughing, and runny nose. These are the same medicines that a person takes for the common cold. If you develop symptoms, call your health care provider. People with severe symptoms may need hospital care. What can I do to protect myself and my family from this virus?     You can protect yourself and your family by taking the same actions that you would take to prevent the spread of other viruses. Take the following actions:  Wash your hands often with soap and water for at least 20 seconds. If soap and water are not available, use alcohol-based hand sanitizer.  Avoid touching your face, mouth, nose, or eyes.  Cough or sneeze into a tissue, sleeve, or elbow. Do not cough or sneeze into your hand or the air. ? If you cough or sneeze into a tissue, throw it away immediately and wash your hands.   Disinfect objects and surfaces that you frequently touch every day.  Avoid close contact with people who are sick or have a fever or cough. Stay at least 3-6 ft (1-2 m) away from them, if possible.  Stay home if you are sick, except to get medical care. Call your health care provider before you get medical care.  Make sure your vaccines are up to date. Ask your health care provider what vaccines you need. What should I do if I need to travel? Follow travel recommendations from your local health authority, the CDC, and WHO. Travel information and advice  Centers for Disease Control and Prevention (CDC): GeminiCard.gl  World Health Organization [facility]): PreviewDomains.se Know the risks and take action to protect your health  You are at higher risk of getting coronavirus disease if you are traveling to areas with an outbreak or if you are exposed to travelers from areas with an outbreak.  Wash your hands often and practice good hygiene to lower the risk of catching or spreading the virus. What should I do if I am sick? General instructions to stop the spread of infection  Wash your hands often with soap and water for at least 20 seconds. If soap and water are not available, use alcohol-based hand sanitizer.  Cough or sneeze into a tissue, sleeve, or elbow. Do not cough or sneeze into your hand or the air.  If you cough or sneeze into a tissue, throw it away immediately and wash your hands.  Stay home unless you must get medical care. Call your health care provider or local health authority before you get medical care.  Avoid public areas. Do not take public transportation, if possible.  If you can, wear a mask if you must go out of the house or if you are in close contact with someone who is not sick. Keep your home clean  Disinfect objects and surfaces that are frequently touched every day.  This may include: ? Counters and tables. ? Doorknobs and light switches. ? Sinks and faucets. ? Electronics such as phones, remote controls, keyboards, computers, and tablets.  Wash dishes in hot, soapy water or use a dishwasher. Air-dry your dishes.  Wash laundry in hot water. Prevent infecting other household members  Let healthy household members care for children and pets, if possible. If you have to care for children or pets, wash your hands often and wear a mask.  Sleep in a different bedroom or bed, if possible.  Do not share personal items, such as razors, toothbrushes, deodorant, combs, brushes, towels, and washcloths. Where to find more information Centers for Disease Control and Prevention (CDC)  Information and news updates: CardRetirement.cz World Health Organization [facility])  Information and news updates: AffordableSalon.es  Coronavirus health topic: https://thompson-craig.com/  Questions and answers on COVID-19: kruiseway.com  Global tracker: who.sprinklr.com American Academy of Pediatrics (AAP)  Information for families: www.healthychildren.org/English/health-issues/conditions/chest-lungs/Pages/2019-Novel-Coronavirus.aspx The coronavirus situation is changing rapidly. Check your local health authority website or the CDC and [facility] websites for updates and news. When should I contact a health care provider?  Contact your health care provider if you have symptoms of an infection, such as fever or cough, and you: ? Have been near anyone who is known to have coronavirus disease. ? Have come into contact with a person who is suspected to have coronavirus disease. ? Have traveled outside of the country. When should I get emergency medical care?  Get help right away by calling your local emergency services (911 in the [place] if you have: ? Trouble breathing. ? Pain or  pressure in your chest. ? Confusion. ? Blue-tinged lips and fingernails. ? Difficulty waking from sleep. ? Symptoms that get worse. Let the emergency medical personnel know if you think you have coronavirus disease. Summary  A new respiratory virus is spreading from person to person and causing COVID-19 (coronavirus disease).  The virus that causes COVID-19 appears to spread easily. It spreads from one person to another through droplets from coughing or sneezing.  Older adults and those with chronic diseases are at higher risk of disease. If you are at higher risk for complications, take extra precautions.  There is currently no vaccine to prevent coronavirus disease. There are no medicines, such as antibiotics or  antivirals, to treat the virus.  You can protect yourself and your family by washing your hands often, avoiding touching your face, and covering your coughs and sneezes. This information is not intended to replace advice given to you by your health care provider. Make sure you discuss any questions you have with your health care provider. Document Released: 10/09/2018 Document Revised: 10/09/2018 Document Reviewed: 10/09/2018 Elsevier Patient Education  2020 [employer]

## 2019-06-04 NOTE — Progress Notes (Signed)
Patient: Heidi Hernandez Female    DOB: 07/03/1950   68 y.o.   MRN: [id number] Visit Date: 06/04/2019  Today's Provider: Jairo BenMichelle Smith Flinchum, FNP   Chief Complaint  Patient presents with   Cough   Subjective:    Virtual Visit via Telephone Note  I connected with Heidi Paoarol M Zechman on 06/04/19 at 11:00 AM EST by telephone and verified that I am speaking with the correct person using two identifiers.  Location: Patient: at home  Provider: Provider: Provider's [facility] at  [facility], [place].      I discussed the limitations, risks, security and privacy concerns of performing an evaluation and management service by telephone and the availability of in person appointments. I also discussed with the patient that there may be a patient responsible charge related to this service. The patient expressed understanding and agreed to proceed.  I discussed the assessment and treatment plan with the patient. The patient was provided an opportunity to ask questions and all were answered. The patient agreed with the plan and demonstrated an understanding of the instructions.   The patient was advised to call back or seek an in-person evaluation if the symptoms worsen or if the condition fails to improve as anticipated.  I provided 15 minutes of non-face-to-face time during this encounter.   Fonda KinderKathleen J Azarias Chiou, CMA  Cough This is a new problem. The current episode started in the past 7 days. The problem has been unchanged. The cough is productive of purulent sputum. Associated symptoms include chest pain, chills, ear congestion, headaches, myalgias, postnasal drip, a sore throat (patient describes as scratchy) and wheezing. Pertinent negatives include no ear pain, fever (no higher than 99), heartburn, hemoptysis, nasal congestion, rash, rhinorrhea, shortness of breath, sweats or weight loss. She has tried steroid inhaler (Nyquil) for the symptoms. Her past medical  history is significant for asthma.   Onset 7 days agoShe has history of bronchitis.  Mild chest tightness. 06/24/2019. Sinus congestion. Sinus pressure.  Non productive cough. Chest congestion with chest tightness. Mild wheezing intermittent she is using her inhalers as prescribed with relief/  She has not been seen anywhere else.  She has had Covid test the Friday before her symptoms started she reports was negative.  Mild body  Aches.  Immunocompromised Asplenic.  History of Asthma.  Former Smoker.   She reports personal history of bronchitis and pneumonia. She requests Tussionex Pennkinetic ER for cough, says its the " only on that helps cough" She denies any other antihistamines she is using currently. No other narcotics or sedatives. She has morphine listed as allergy with itching - she denies ever having any issue with the cough medication.   Patient  denies any  rash, chest pain, shortness of breath, nausea, vomiting, or diarrhea.  Denies any fever reducing or over the counter medication use.  She denies any known covid exposures or other ill exposures.   Allergies  Allergen Reactions   Augmentin [Amoxicillin-Pot Clavulanate] Nausea And Vomiting   Droperidol Other (See Comments)    "Locked jaw" Facial muscles locked "Locked jaw"   Paroxetine Hcl Other (See Comments)    syncope Passed out    Morphine Itching     Current Outpatient Medications:    albuterol (VENTOLIN HFA) 108 (90 Base) MCG/ACT inhaler, Inhale 2 puffs into the lungs every 6 (six) hours as needed for wheezing. May substitute generic or equivalent brand name, Disp: 18 g, Rfl: 1   alendronate (  FOSAMAX) 70 MG tablet, TAKE ONE TABLET BY MOUTH EACH WEEK, ON AN EMPTY STOMACH BEFORE BREAKFAST WITH 8oz OF WATER AND REMAIN UPRIGHT FOR :30, Disp: 12 tablet, Rfl: 4   amLODipine (NORVASC) 2.5 MG tablet, Take 1 tablet (2.5 mg total) by mouth daily., Disp: 90 tablet, Rfl: 3   atenolol (TENORMIN) 50 MG tablet, TAKE 1  TABLET BY MOUTH ONCE DAILY, Disp: 30 tablet, Rfl: 12   azelastine (ASTELIN) 0.1 % nasal spray, Place 2 sprays into both nostrils daily. , Disp: , Rfl:    EPINEPHrine 0.3 mg/0.3 mL IJ SOAJ injection, as needed., Disp: , Rfl:    Fexofenadine HCl (ALLEGRA PO), Take 1 capsule by mouth daily., Disp: , Rfl:    fluticasone (FLONASE) 50 MCG/ACT nasal spray, Place 2 sprays into both nostrils daily. , Disp: , Rfl:    fluticasone (FLOVENT HFA) 44 MCG/ACT inhaler, Inhale 1 puff into the lungs daily., Disp: , Rfl:    LORazepam (ATIVAN) 0.5 MG tablet, TAKE 1/2 TABLET BY MOUTH EVERY 6 HOURS AS NEEDED ANXIETY, Disp: 15 tablet, Rfl: 3   montelukast (SINGULAIR) 10 MG tablet, Take 10 mg by mouth at bedtime. , Disp: , Rfl:    omeprazole (PRILOSEC) 40 MG capsule, Take 40 mg by mouth daily., Disp: , Rfl:    RABEprazole (ACIPHEX) 20 MG tablet, Take 20 mg by mouth 2 (two) times daily. , Disp: , Rfl:    rOPINIRole (REQUIP) 2 MG tablet, TAKE 1/2 TABLET BY MOUTH AT BEDTIME FOR 2 DAYS THEN INCREASE TO 1 TABLET AT BEDTIME FOR 5 DAYS THEN 2 TABLETS AT BEDTIME, Disp: 30 tablet, Rfl: 5   SUMAtriptan (IMITREX) 100 MG tablet, TAKE 1 TABLET AT ONSET OF HEADACHE,MAY TAKE AN ADDITIONAL TABLET IN 2HRS IF NEEDED BUT MAX 2 TABLETS PER 24 HRS, Disp: 9 tablet, Rfl: 5   terbinafine (LAMISIL) 250 MG tablet, Take 2 tablets (500 mg total) by mouth daily. Take for 1 week out of every 4 weeks, Disp: 14 tablet, Rfl: 3   Vitamin D, Ergocalciferol, (DRISDOL) 1.25 MG (50000 UT) CAPS capsule, TAKE 1 CAPSULE BY MOUTH ONCE EVERY 7 DAYS, Disp: 12 capsule, Rfl: 4   famotidine (PEPCID) 20 MG tablet, Take 1 tablet (20 mg total) by mouth 2 (two) times daily for 10 days. (Patient taking differently: Take 20 mg by mouth 2 (two) times daily. As needed), Disp: 20 tablet, Rfl: 0  Review of Systems  Constitutional: Positive for chills. Negative for activity change, appetite change, diaphoresis, fatigue, fever (no higher than 99), unexpected weight  change and weight loss.  HENT: Positive for congestion, postnasal drip, sinus pressure and sore throat (patient describes as scratchy). Negative for dental problem, drooling, ear discharge, ear pain, facial swelling, hearing loss, mouth sores, nosebleeds, rhinorrhea, sinus pain, sneezing, tinnitus, trouble swallowing and voice change.   Respiratory: Positive for cough, chest tightness and wheezing. Negative for apnea, hemoptysis, choking, shortness of breath and stridor.   Cardiovascular: Positive for chest pain. Negative for palpitations and leg swelling.  Gastrointestinal: Negative.  Negative for heartburn.  Genitourinary: Negative.   Musculoskeletal: Positive for myalgias. Negative for arthralgias, back pain, gait problem, joint swelling, neck pain and neck stiffness.  Skin: Negative.  Negative for color change, pallor, rash and wound.  Allergic/Immunologic: Positive for immunocompromised state.  Neurological: Positive for headaches. Negative for dizziness, tremors, seizures, syncope, facial asymmetry, speech difficulty, weakness, light-headedness and numbness.  Hematological: Negative.   Psychiatric/Behavioral: Negative.     Social History   Tobacco Use  Smoking status: Former Smoker    Packs/day: 1.00    Years: 10.00    Pack years: 10.00    Types: Cigarettes    Quit date: 11/10/1979    Years since quitting: 39.5   Smokeless tobacco: Never Used  Substance Use Topics   Alcohol use: Yes    Alcohol/week: 0.0 standard drinks    Comment: occasional- wine      Objective:   There were no vitals taken for this visit. There were no vitals filed for this visit.There is no height or weight on file to calculate BMI.   Physical Exam   Patient is alert and oriented and responsive to questions Engages in conversation with provider. Speaks in full sentences without any pauses without any shortness of breath or distress.  She does have bronchial cough noted on the phone while speaking  with provider.  She has no stridor or distress noted normal thyroid.  Patient has only taken her temperature, does not have a pulse oximetry. She is able to walk across her home without any distress she reports.      Assessment & Plan    1. Cough Recommend testing for Covid 19 at drive through testing [facility].  Patient will go. Discussed side effects and black box warning of medication below and not to take with other antihistamines, sedatives or CNS depressants.  - Novel Coronavirus, NAA ([facility]) - chlorpheniramine-HYDROcodone (TUSSIONEX PENNKINETIC ER) 10-8 MG/5ML SUER; Take 5 mLs by mouth at bedtime as needed for cough (will cause drowsiness).  Dispense: 60 mL; Refill: 0  2. Bronchitis History of bronchitis and pneumonia per patient.  She is asplenic and immunocompromised. Meds ordered this encounter  Medications   doxycycline (VIBRA-TABS) 100 MG tablet    Sig: Take 1 tablet (100 mg total) by mouth 2 (two) times daily.    Dispense:  20 tablet    Refill:  0   predniSONE (STERAPRED UNI-PAK 21 TAB) 10 MG (21) TBPK tablet    Sig: PO: Take 6 tablets on day 1:Take 5 tablets day 2:Take 4 tablets day 3: Take 3 tablets day 4:Take 2 tablets day five: 5 Take 1 tablet day 6    Dispense:  21 tablet    Refill:  0   chlorpheniramine-HYDROcodone (TUSSIONEX PENNKINETIC ER) 10-8 MG/5ML SUER    Sig: Take 5 mLs by mouth at bedtime as needed for cough (will cause drowsiness).    Dispense:  60 mL    Refill:  0  PMP aware reviewed.  Discussed only using prescription cough medicine as needed and do not take more than directed.  Discussed throat lozenges and over-the-counter remedies as well. 3.  Upper respiratory tract infection Discussed Fluids and hydration. Rest.   4. Encounter for screening laboratory testing for COVID-19 virus Orders Placed This Encounter  Procedures   Novel Coronavirus, NAA ([facility])   Patient is advised that this could be COVID-19 related, she should quarantine at home  from 14 days from her symptom onset.  She is advised that if the above treatment does not help symptoms or symptoms do not improve within 3 to 4 days, she should be evaluated at the [facility] urgent care or a facility that is seeing respiratory patients during the COVID-19 pandemic.  Provider discussed effects of medications as above and not to lie down within 30 minutes to 1 hour of taking doxycycline.  Patient verbalized understanding of all instructions given and denies any further questions at this time.    Advised patient call the  office or your primary care doctor for an appointment if no improvement within 72 hours or if any symptoms change or worsen at any time  Advised ER or urgent Care if after hours or on weekend. Call 911 for emergency symptoms at any time.Patinet verbalized understanding of all instructions given/reviewed and treatment plan and has no further questions or concerns at this time.     The entirety of the information documented in the History of Present Illness, Review of Systems and Physical Exam were personally obtained by me. Portions of this information were initially documented by the  Certified Medical Assistant whose name is documented in Epic and reviewed by me for thoroughness and accuracy.  I have personally performed the exam and reviewed the chart and it is accurate to the best of my knowledge.  Museum/gallery conservator has been used and any errors in dictation or transcription are unintentional.  Eula Fried. Flinchum FNP-C  [facility]       Jairo Ben, FNP  [facility] Patient seen by Marvell Fuller FNP......, note scribed by Sheliah Hatch, NCMA

## 2019-06-04 NOTE — Telephone Encounter (Signed)
Telephone visit scheduled with Sharyn Lull Flinchum at 11am.

## 2019-06-04 NOTE — Telephone Encounter (Signed)
From [facility]  Copied from [place] [id number]. Topic: General - Other >> Jun 04, 2019  9:22 AM Virl Axe D wrote: Reason for CRM: Pt stated she was supposed to be scheduled for telephone appt on today at 8:40am with Dr. Caryn Section for a cough she has had for a week. Pt scheduled for 07/09/19. Please follow up with pt this morning.

## 2019-06-06 ENCOUNTER — Other Ambulatory Visit: Payer: Self-pay | Admitting: Family Medicine

## 2019-06-07 ENCOUNTER — Other Ambulatory Visit: Payer: Self-pay

## 2019-06-07 DIAGNOSIS — Z20822 Contact with and (suspected) exposure to covid-19: Secondary | ICD-10-CM

## 2019-06-08 LAB — NOVEL CORONAVIRUS, NAA: SARS-CoV-2, NAA: NOT DETECTED

## 2019-06-14 ENCOUNTER — Telehealth: Payer: Self-pay | Admitting: Family Medicine

## 2019-06-14 NOTE — Telephone Encounter (Signed)
Pt states that the rOPINIRole (REQUIP) 2 MG tablet isn't working for her restless leg.  Pt states that she only got 1 hour of sleep last night and is miserable.  Pt would like to know what else she can do.

## 2019-06-16 MED ORDER — CLONAZEPAM 0.5 MG PO TABS: 0.5000 mg | ORAL_TABLET | Freq: Every day | ORAL | 1 refills | Status: DC

## 2019-06-16 NOTE — Telephone Encounter (Signed)
Have sent prescription for clonazepam to take at bedtime in place of ropinirole.

## 2019-06-17 NOTE — Telephone Encounter (Signed)
Patient advised.

## 2019-07-04 DIAGNOSIS — J3089 Other allergic rhinitis: Secondary | ICD-10-CM | POA: Diagnosis not present

## 2019-07-04 DIAGNOSIS — J3081 Allergic rhinitis due to animal (cat) (dog) hair and dander: Secondary | ICD-10-CM | POA: Diagnosis not present

## 2019-07-04 DIAGNOSIS — J301 Allergic rhinitis due to pollen: Secondary | ICD-10-CM | POA: Diagnosis not present

## 2019-07-09 ENCOUNTER — Encounter: Payer: Self-pay | Admitting: Family Medicine

## 2019-07-09 ENCOUNTER — Ambulatory Visit (INDEPENDENT_AMBULATORY_CARE_PROVIDER_SITE_OTHER): Payer: Medicare Other | Admitting: Family Medicine

## 2019-07-09 ENCOUNTER — Other Ambulatory Visit: Payer: Self-pay | Admitting: Family Medicine

## 2019-07-09 DIAGNOSIS — G2581 Restless legs syndrome: Secondary | ICD-10-CM

## 2019-07-09 MED ORDER — ROPINIROLE HCL 2 MG PO TABS: 2.0000 mg | ORAL_TABLET | Freq: Four times a day (QID) | ORAL | 1 refills | Status: DC

## 2019-07-09 NOTE — Patient Instructions (Signed)
.   Please review the attached list of medications and notify my office if there are any errors.   . Please bring all of your medications to every appointment so we can make sure that our medication list is the same as yours.   

## 2019-07-09 NOTE — Progress Notes (Signed)
Patient: Heidi Hernandez Female    DOB: Mar 29, 1951   69 y.o.   MRN: [id number] Visit Date: 07/09/2019  Today's Provider: Lelon Huh, MD   Chief Complaint  Patient presents with  . restless leg syndrome]   Subjective:    Virtual Visit via Telephone Note  I connected with Heidi Hernandez on 07/09/19 at  8:40 AM EST by telephone and verified that I am speaking with the correct person using two identifiers.  Location: Patient: home Provider: [facility]   I discussed the limitations, risks, security and privacy concerns of performing an evaluation and management service by telephone and the availability of in person appointments. I also discussed with the patient that there may be a patient responsible charge related to this service. The patient expressed understanding and agreed to proceed.   HPI  Follow up for Restless Leg:   The patient was last seen for this 3 months ago. Changes made at last visit include started Ropinirole. She states she is now having to take 2mg  tablets of ropinirole throughout the day due to legs feeling nervous all day and night. She is taking on in the morning, one mid day, and 2 at bedtime. We called in clonazepam 2 3 weeks ago with intention of having her take it in place of ropinirole at night, but she states she has to take both clonazepam and 2 of tthe 2mg  ropinirole to get any sleep.   She has a long history of RLS which has become increasing severe over the last few years. She failed a trial of gabapentin in 2018. She had long trial of mirapex titrated up to 1mg  twice a day which eventually stopped being affective and was changed back to ropinirole last May  She had ferritin level checked in October which was 100. Vitamin D 25-oh was 31.1. TSH was 1.15. Magnesium, CMP and CBC were normal. She denies any other tremors.    ------------------------------------------------------------------------------------  Allergies  Allergen Reactions  .  Augmentin [Amoxicillin-Pot Clavulanate] Nausea And Vomiting  . Droperidol Other (See Comments)    "Locked jaw" Facial muscles locked "Locked jaw"  . Paroxetine Hcl Other (See Comments)    syncope Passed out   . Morphine Itching     Current Outpatient Medications:  .  albuterol (VENTOLIN HFA) 108 (90 Base) MCG/ACT inhaler, Inhale 2 puffs into the lungs every 6 (six) hours as needed for wheezing. May substitute generic or equivalent brand name, Disp: 18 g, Rfl: 1 .  alendronate (FOSAMAX) 70 MG tablet, TAKE ONE TABLET BY MOUTH EACH WEEK, ON AN EMPTY STOMACH BEFORE BREAKFAST WITH 8oz OF WATER AND REMAIN UPRIGHT FOR :30, Disp: 12 tablet, Rfl: 4 .  amLODipine (NORVASC) 2.5 MG tablet, Take 1 tablet (2.5 mg total) by mouth daily., Disp: 90 tablet, Rfl: 3 .  atenolol (TENORMIN) 50 MG tablet, TAKE 1 TABLET BY MOUTH ONCE DAILY, Disp: 30 tablet, Rfl: 12 .  azelastine (ASTELIN) 0.1 % nasal spray, Place 2 sprays into both nostrils daily. , Disp: , Rfl:  .  chlorpheniramine-HYDROcodone (TUSSIONEX PENNKINETIC ER) 10-8 MG/5ML SUER, Take 5 mLs by mouth at bedtime as needed for cough (will cause drowsiness)., Disp: 60 mL, Rfl: 0 .  clonazePAM (KLONOPIN) 0.5 MG tablet, Take 1 tablet (0.5 mg total) by mouth at bedtime. For restless legs, Disp: 30 tablet, Rfl: 1 .  doxycycline (VIBRA-TABS) 100 MG tablet, Take 1 tablet (100 mg total) by mouth 2 (two) times daily., Disp: 20 tablet, Rfl:  0 .  EPINEPHrine 0.3 mg/0.3 mL IJ SOAJ injection, as needed., Disp: , Rfl:  .  Fexofenadine HCl (ALLEGRA PO), Take 1 capsule by mouth daily., Disp: , Rfl:  .  fluticasone (FLONASE) 50 MCG/ACT nasal spray, Place 2 sprays into both nostrils daily. , Disp: , Rfl:  .  fluticasone (FLOVENT HFA) 44 MCG/ACT inhaler, Inhale 1 puff into the lungs daily., Disp: , Rfl:  .  LORazepam (ATIVAN) 0.5 MG tablet, TAKE 1/2 TABLET BY MOUTH EVERY 6 HOURS AS NEEDED ANXIETY, Disp: 15 tablet, Rfl: 3 .  montelukast (SINGULAIR) 10 MG tablet, Take 10 mg by  mouth at bedtime. , Disp: , Rfl:  .  omeprazole (PRILOSEC) 40 MG capsule, Take 40 mg by mouth daily., Disp: , Rfl:  .  RABEprazole (ACIPHEX) 20 MG tablet, Take 20 mg by mouth 2 (two) times daily. , Disp: , Rfl:  .  SUMAtriptan (IMITREX) 100 MG tablet, TAKE 1 TABLET BY MOUTH AT ONSET OF HEADACHE. MAY TAKE AN ADDITIONAL TABLET IN 2 HOURS IF NEEDED. MAX OF 200mg /24hr, Disp: 9 tablet, Rfl: 1 .  terbinafine (LAMISIL) 250 MG tablet, Take 2 tablets (500 mg total) by mouth daily. Take for 1 week out of every 4 weeks, Disp: 14 tablet, Rfl: 3 .  Vitamin D, Ergocalciferol, (DRISDOL) 1.25 MG (50000 UT) CAPS capsule, TAKE 1 CAPSULE BY MOUTH ONCE EVERY 7 DAYS, Disp: 12 capsule, Rfl: 4 .  famotidine (PEPCID) 20 MG tablet, Take 1 tablet (20 mg total) by mouth 2 (two) times daily for 10 days. (Patient taking differently: Take 20 mg by mouth 2 (two) times daily. As needed), Disp: 20 tablet, Rfl: 0 .  rOPINIRole (REQUIP) 2 MG tablet, TAKE 1/2 TABLET BY MOUTH AT BEDTIME FOR 2 DAYS THEN INCREASE TO 1 TABLET AT BEDTIME FOR 5 DAYS THEN 2 TABLETS AT BEDTIME (Patient not taking: Reported on 07/09/2019), Disp: 30 tablet, Rfl: 5  Review of Systems  Constitutional: Negative for appetite change, chills, fatigue and fever.  Respiratory: Negative for chest tightness and shortness of breath.   Cardiovascular: Negative for chest pain and palpitations.  Gastrointestinal: Negative for abdominal pain, nausea and vomiting.  Neurological: Negative for dizziness and weakness.    Social History   Tobacco Use  . Smoking status: Former Smoker    Packs/day: 1.00    Years: 10.00    Pack years: 10.00    Types: Cigarettes    Quit date: 11/10/1979    Years since quitting: 39.6  . Smokeless tobacco: Never Used  Substance Use Topics  . Alcohol use: Yes    Alcohol/week: 0.0 standard drinks    Comment: occasional- wine      Objective:   Physical Exam  Awake, alert, oriented x 3. In no apparent distress  No results found for any  visits on 07/09/19.     Assessment & Plan      1. Restless legs syndrome Long history of RLS atypical due to persistence of symptoms throughout the day and night and refractory to relatively high dose of ropinirole and Mirapex. She is interested in neurological evaluation  - Ambulatory referral to Neurology  - rOPINIRole (REQUIP) 2 MG tablet; Take 1 tablet (2 mg total) by mouth 4 (four) times daily.  Dispense: 120 tablet; Refill: 1    I discussed the assessment and treatment plan with the patient. The patient was provided an opportunity to ask questions and all were answered. The patient agreed with the plan and demonstrated an understanding of the instructions.  The patient was advised to call back or seek an in-person evaluation if the symptoms worsen or if the condition fails to improve as anticipated.  I provided 11 minutes of non-face-to-face time during this encounter.  The entirety of the information documented in the History of Present Illness, Review of Systems and Physical Exam were personally obtained by me. Portions of this information were initially documented by Martyn Ehrich, CMA and reviewed by me for thoroughness and accuracy.     [name], MD  [facility]

## 2019-07-11 DIAGNOSIS — J3089 Other allergic rhinitis: Secondary | ICD-10-CM | POA: Diagnosis not present

## 2019-07-11 DIAGNOSIS — J3081 Allergic rhinitis due to animal (cat) (dog) hair and dander: Secondary | ICD-10-CM | POA: Diagnosis not present

## 2019-07-11 DIAGNOSIS — J301 Allergic rhinitis due to pollen: Secondary | ICD-10-CM | POA: Diagnosis not present

## 2019-07-18 DIAGNOSIS — E519 Thiamine deficiency, unspecified: Secondary | ICD-10-CM | POA: Diagnosis not present

## 2019-07-18 DIAGNOSIS — G2581 Restless legs syndrome: Secondary | ICD-10-CM | POA: Diagnosis not present

## 2019-07-18 DIAGNOSIS — E538 Deficiency of other specified B group vitamins: Secondary | ICD-10-CM | POA: Diagnosis not present

## 2019-07-19 DIAGNOSIS — J301 Allergic rhinitis due to pollen: Secondary | ICD-10-CM | POA: Diagnosis not present

## 2019-07-19 DIAGNOSIS — J3089 Other allergic rhinitis: Secondary | ICD-10-CM | POA: Diagnosis not present

## 2019-07-19 DIAGNOSIS — J3081 Allergic rhinitis due to animal (cat) (dog) hair and dander: Secondary | ICD-10-CM | POA: Diagnosis not present

## 2019-07-23 DIAGNOSIS — E538 Deficiency of other specified B group vitamins: Secondary | ICD-10-CM | POA: Diagnosis not present

## 2019-07-25 DIAGNOSIS — J3081 Allergic rhinitis due to animal (cat) (dog) hair and dander: Secondary | ICD-10-CM | POA: Diagnosis not present

## 2019-07-25 DIAGNOSIS — J301 Allergic rhinitis due to pollen: Secondary | ICD-10-CM | POA: Diagnosis not present

## 2019-07-25 DIAGNOSIS — J3089 Other allergic rhinitis: Secondary | ICD-10-CM | POA: Diagnosis not present

## 2019-07-30 DIAGNOSIS — E538 Deficiency of other specified B group vitamins: Secondary | ICD-10-CM | POA: Diagnosis not present

## 2019-08-06 DIAGNOSIS — R200 Anesthesia of skin: Secondary | ICD-10-CM | POA: Diagnosis not present

## 2019-08-07 ENCOUNTER — Other Ambulatory Visit: Payer: Self-pay | Admitting: Family Medicine

## 2019-08-07 DIAGNOSIS — M62838 Other muscle spasm: Secondary | ICD-10-CM | POA: Diagnosis not present

## 2019-08-07 DIAGNOSIS — E538 Deficiency of other specified B group vitamins: Secondary | ICD-10-CM | POA: Diagnosis not present

## 2019-08-07 DIAGNOSIS — R252 Cramp and spasm: Secondary | ICD-10-CM | POA: Diagnosis not present

## 2019-08-07 DIAGNOSIS — G2581 Restless legs syndrome: Secondary | ICD-10-CM | POA: Diagnosis not present

## 2019-08-07 NOTE — Telephone Encounter (Signed)
Requested Prescriptions  Pending Prescriptions Disp Refills  . atenolol (TENORMIN) 50 MG tablet [Pharmacy Med Name: ATENOLOL 50 MG TAB] 30 tablet 12    Sig: TAKE 1 TABLET BY MOUTH ONCE DAILY     Cardiovascular:  Beta Blockers Failed - 08/07/2019  3:14 PM      Failed - Last BP in normal range    BP Readings from Last 1 Encounters:  04/24/19 (!) 186/97         Passed - Last Heart Rate in normal range    Pulse Readings from Last 1 Encounters:  04/24/19 65         Passed - Valid encounter within last 6 months    Recent Outpatient Visits          4 weeks ago Restless legs syndrome   [facility] Malva Limes, MD   2 months ago Cough   [facility] Flinchum, Eula Fried, FNP   4 months ago Urinary frequency   [facility] Malva Limes, MD   11 months ago Cough   [facility] Malva Limes, MD   1 year ago Acute maxillary sinusitis, recurrence not specified   [facility] Malva Limes, MD

## 2019-08-08 DIAGNOSIS — J3081 Allergic rhinitis due to animal (cat) (dog) hair and dander: Secondary | ICD-10-CM | POA: Diagnosis not present

## 2019-08-08 DIAGNOSIS — J301 Allergic rhinitis due to pollen: Secondary | ICD-10-CM | POA: Diagnosis not present

## 2019-08-08 DIAGNOSIS — J3089 Other allergic rhinitis: Secondary | ICD-10-CM | POA: Diagnosis not present

## 2019-08-12 ENCOUNTER — Encounter: Payer: Self-pay | Admitting: *Deleted

## 2019-08-18 ENCOUNTER — Ambulatory Visit: Payer: Medicare Other | Attending: Internal Medicine

## 2019-08-18 DIAGNOSIS — Z23 Encounter for immunization: Secondary | ICD-10-CM | POA: Insufficient documentation

## 2019-08-18 NOTE — Progress Notes (Signed)
   Covid-19 Vaccination Clinic  Name:  Heidi Hernandez    MRN: [id number] DOB: 31-Jan-1951  08/18/2019  Heidi Hernandez was observed post Covid-19 immunization for 15 minutes without incidence. She was provided with Vaccine Information Sheet and instruction to access the V-Safe system.   Heidi Hernandez was instructed to call 911 with any severe reactions post vaccine: Marland Kitchen Difficulty breathing  . Swelling of your face and throat  . A fast heartbeat  . A bad rash all over your body  . Dizziness and weakness    Immunizations Administered    Name Date Dose VIS Date Route   Pfizer COVID-19 Vaccine 08/18/2019  8:49 AM 0.3 mL 06/07/2019 Intramuscular   Manufacturer: [employer], [employer]   Lot: [id number]   NDC: [address]-3

## 2019-08-22 DIAGNOSIS — J3089 Other allergic rhinitis: Secondary | ICD-10-CM | POA: Diagnosis not present

## 2019-08-22 DIAGNOSIS — J3081 Allergic rhinitis due to animal (cat) (dog) hair and dander: Secondary | ICD-10-CM | POA: Diagnosis not present

## 2019-08-22 DIAGNOSIS — J301 Allergic rhinitis due to pollen: Secondary | ICD-10-CM | POA: Diagnosis not present

## 2019-08-27 DIAGNOSIS — J3081 Allergic rhinitis due to animal (cat) (dog) hair and dander: Secondary | ICD-10-CM | POA: Diagnosis not present

## 2019-08-27 DIAGNOSIS — J3089 Other allergic rhinitis: Secondary | ICD-10-CM | POA: Diagnosis not present

## 2019-08-27 DIAGNOSIS — J301 Allergic rhinitis due to pollen: Secondary | ICD-10-CM | POA: Diagnosis not present

## 2019-09-05 DIAGNOSIS — R252 Cramp and spasm: Secondary | ICD-10-CM | POA: Diagnosis not present

## 2019-09-05 DIAGNOSIS — J3081 Allergic rhinitis due to animal (cat) (dog) hair and dander: Secondary | ICD-10-CM | POA: Diagnosis not present

## 2019-09-05 DIAGNOSIS — M62838 Other muscle spasm: Secondary | ICD-10-CM | POA: Diagnosis not present

## 2019-09-05 DIAGNOSIS — G2581 Restless legs syndrome: Secondary | ICD-10-CM | POA: Diagnosis not present

## 2019-09-05 DIAGNOSIS — J301 Allergic rhinitis due to pollen: Secondary | ICD-10-CM | POA: Diagnosis not present

## 2019-09-05 DIAGNOSIS — J3089 Other allergic rhinitis: Secondary | ICD-10-CM | POA: Diagnosis not present

## 2019-09-10 ENCOUNTER — Ambulatory Visit: Payer: Medicare Other | Attending: Internal Medicine

## 2019-09-10 DIAGNOSIS — Z23 Encounter for immunization: Secondary | ICD-10-CM

## 2019-09-10 NOTE — Progress Notes (Signed)
   Covid-19 Vaccination Clinic  Name:  Heidi Hernandez    MRN: [id number] DOB: [birth year]  09/10/2019  [name] was observed post Covid-19 immunization for 15 minutes without incident. She was provided with Vaccine Information Sheet and instruction to access the V-Safe system.   [name] was instructed to call 911 with any severe reactions post vaccine: Marland Kitchen Difficulty breathing  . Swelling of face and throat  . A fast heartbeat  . A bad rash all over body  . Dizziness and weakness   Immunizations Administered    Name Date Dose VIS Date Route   Pfizer COVID-19 Vaccine 09/10/2019  8:38 AM 0.3 mL 06/07/2019 Intramuscular   Manufacturer: [employer], [employer]   Lot: [id number]   NDC: [address]-0

## 2019-09-13 DIAGNOSIS — J3081 Allergic rhinitis due to animal (cat) (dog) hair and dander: Secondary | ICD-10-CM | POA: Diagnosis not present

## 2019-09-13 DIAGNOSIS — J301 Allergic rhinitis due to pollen: Secondary | ICD-10-CM | POA: Diagnosis not present

## 2019-09-13 DIAGNOSIS — J3089 Other allergic rhinitis: Secondary | ICD-10-CM | POA: Diagnosis not present

## 2019-09-19 DIAGNOSIS — J301 Allergic rhinitis due to pollen: Secondary | ICD-10-CM | POA: Diagnosis not present

## 2019-09-19 DIAGNOSIS — J3089 Other allergic rhinitis: Secondary | ICD-10-CM | POA: Diagnosis not present

## 2019-09-19 DIAGNOSIS — J3081 Allergic rhinitis due to animal (cat) (dog) hair and dander: Secondary | ICD-10-CM | POA: Diagnosis not present

## 2019-10-01 DIAGNOSIS — J3081 Allergic rhinitis due to animal (cat) (dog) hair and dander: Secondary | ICD-10-CM | POA: Diagnosis not present

## 2019-10-01 DIAGNOSIS — J301 Allergic rhinitis due to pollen: Secondary | ICD-10-CM | POA: Diagnosis not present

## 2019-10-07 ENCOUNTER — Other Ambulatory Visit: Payer: Self-pay | Admitting: Family Medicine

## 2019-10-07 NOTE — Telephone Encounter (Signed)
Requested Prescriptions  Pending Prescriptions Disp Refills  . atenolol (TENORMIN) 50 MG tablet [Pharmacy Med Name: ATENOLOL 50 MG TAB] 90 tablet 0    Sig: TAKE 1 TABLET BY MOUTH ONCE DAILY     Cardiovascular:  Beta Blockers Failed - 10/07/2019 12:33 PM      Failed - Last BP in normal range    BP Readings from Last 1 Encounters:  04/24/19 (!) 186/97         Passed - Last Heart Rate in normal range    Pulse Readings from Last 1 Encounters:  04/24/19 65         Passed - Valid encounter within last 6 months    Recent Outpatient Visits          3 months ago Restless legs syndrome   [facility] Malva Limes, MD   4 months ago Cough   [facility] Flinchum, Eula Fried, FNP   6 months ago Urinary frequency   [facility] Malva Limes, MD   1 year ago Cough   [facility] Malva Limes, MD   1 year ago Acute maxillary sinusitis, recurrence not specified   [facility] Malva Limes, MD

## 2019-10-08 DIAGNOSIS — J301 Allergic rhinitis due to pollen: Secondary | ICD-10-CM | POA: Diagnosis not present

## 2019-10-08 DIAGNOSIS — J3081 Allergic rhinitis due to animal (cat) (dog) hair and dander: Secondary | ICD-10-CM | POA: Diagnosis not present

## 2019-10-08 DIAGNOSIS — J3089 Other allergic rhinitis: Secondary | ICD-10-CM | POA: Diagnosis not present

## 2019-10-17 DIAGNOSIS — G2581 Restless legs syndrome: Secondary | ICD-10-CM | POA: Diagnosis not present

## 2019-10-17 DIAGNOSIS — R252 Cramp and spasm: Secondary | ICD-10-CM | POA: Diagnosis not present

## 2019-10-17 DIAGNOSIS — M62838 Other muscle spasm: Secondary | ICD-10-CM | POA: Diagnosis not present

## 2019-10-17 DIAGNOSIS — E538 Deficiency of other specified B group vitamins: Secondary | ICD-10-CM | POA: Diagnosis not present

## 2019-10-25 DIAGNOSIS — J3081 Allergic rhinitis due to animal (cat) (dog) hair and dander: Secondary | ICD-10-CM | POA: Diagnosis not present

## 2019-10-25 DIAGNOSIS — J3089 Other allergic rhinitis: Secondary | ICD-10-CM | POA: Diagnosis not present

## 2019-10-25 DIAGNOSIS — J301 Allergic rhinitis due to pollen: Secondary | ICD-10-CM | POA: Diagnosis not present

## 2019-10-30 DIAGNOSIS — J019 Acute sinusitis, unspecified: Secondary | ICD-10-CM | POA: Diagnosis not present

## 2019-10-30 DIAGNOSIS — J209 Acute bronchitis, unspecified: Secondary | ICD-10-CM | POA: Diagnosis not present

## 2019-10-30 DIAGNOSIS — Z03818 Encounter for observation for suspected exposure to other biological agents ruled out: Secondary | ICD-10-CM | POA: Diagnosis not present

## 2019-10-30 DIAGNOSIS — B9689 Other specified bacterial agents as the cause of diseases classified elsewhere: Secondary | ICD-10-CM | POA: Diagnosis not present

## 2019-10-30 DIAGNOSIS — Z20822 Contact with and (suspected) exposure to covid-19: Secondary | ICD-10-CM | POA: Diagnosis not present

## 2019-11-05 ENCOUNTER — Telehealth: Payer: Self-pay | Admitting: Family Medicine

## 2019-11-05 DIAGNOSIS — G2581 Restless legs syndrome: Secondary | ICD-10-CM | POA: Diagnosis not present

## 2019-11-05 DIAGNOSIS — M62838 Other muscle spasm: Secondary | ICD-10-CM | POA: Diagnosis not present

## 2019-11-05 DIAGNOSIS — J301 Allergic rhinitis due to pollen: Secondary | ICD-10-CM | POA: Diagnosis not present

## 2019-11-05 DIAGNOSIS — E538 Deficiency of other specified B group vitamins: Secondary | ICD-10-CM | POA: Diagnosis not present

## 2019-11-05 DIAGNOSIS — R252 Cramp and spasm: Secondary | ICD-10-CM | POA: Diagnosis not present

## 2019-11-05 DIAGNOSIS — J3081 Allergic rhinitis due to animal (cat) (dog) hair and dander: Secondary | ICD-10-CM | POA: Diagnosis not present

## 2019-11-05 DIAGNOSIS — J3089 Other allergic rhinitis: Secondary | ICD-10-CM | POA: Diagnosis not present

## 2019-11-05 NOTE — Chronic Care Management (AMB) (Signed)
  Chronic Care Management   Note  11/05/2019 Name: LAGUANA DESAUTEL MRN: [id number] DOB: Jan 13, 1951  Heidi Hernandez is a 69 y.o. year old female who is a primary care patient of Caryn Section, Kirstie Peri, MD. I reached out to Renato Shin by phone today in response to a referral sent by Ms. Erick Blinks Mahn's health plan.     Ms. Bodie was given information about Chronic Care Management services today including:  1. CCM service includes personalized support from designated clinical staff supervised by her physician, including individualized plan of care and coordination with other care providers 2. 24/7 contact phone numbers for assistance for urgent and routine care needs. 3. Service will only be billed when office clinical staff spend 20 minutes or more in a month to coordinate care. 4. Only one practitioner may furnish and bill the service in a calendar month. 5. The patient may stop CCM services at any time (effective at the end of the month) by phone call to the office staff. 6. The patient will be responsible for cost sharing (co-pay) of up to 20% of the service fee (after annual deductible is met).  Patient did not agree to enrollment in care management services and does not wish to consider at this time.  Follow up plan: The patient has been provided with contact information for the care management team and has been advised to call with any health related questions or concerns.   Noreene Larsson, [address], [place], [place] [address] Direct Dial: [phone] [email or web address]: [email or web address]

## 2019-11-19 DIAGNOSIS — R252 Cramp and spasm: Secondary | ICD-10-CM | POA: Diagnosis not present

## 2019-11-19 DIAGNOSIS — M62838 Other muscle spasm: Secondary | ICD-10-CM | POA: Diagnosis not present

## 2019-11-19 DIAGNOSIS — G2581 Restless legs syndrome: Secondary | ICD-10-CM | POA: Diagnosis not present

## 2019-11-19 DIAGNOSIS — E538 Deficiency of other specified B group vitamins: Secondary | ICD-10-CM | POA: Diagnosis not present

## 2019-11-20 DIAGNOSIS — I341 Nonrheumatic mitral (valve) prolapse: Secondary | ICD-10-CM | POA: Diagnosis not present

## 2019-11-20 DIAGNOSIS — I10 Essential (primary) hypertension: Secondary | ICD-10-CM | POA: Diagnosis not present

## 2019-11-20 DIAGNOSIS — G2581 Restless legs syndrome: Secondary | ICD-10-CM | POA: Diagnosis not present

## 2019-11-21 DIAGNOSIS — J3089 Other allergic rhinitis: Secondary | ICD-10-CM | POA: Diagnosis not present

## 2019-11-21 DIAGNOSIS — J3081 Allergic rhinitis due to animal (cat) (dog) hair and dander: Secondary | ICD-10-CM | POA: Diagnosis not present

## 2019-11-21 DIAGNOSIS — J301 Allergic rhinitis due to pollen: Secondary | ICD-10-CM | POA: Diagnosis not present

## 2019-11-23 DIAGNOSIS — K5900 Constipation, unspecified: Secondary | ICD-10-CM | POA: Diagnosis not present

## 2019-11-23 DIAGNOSIS — I10 Essential (primary) hypertension: Secondary | ICD-10-CM | POA: Diagnosis not present

## 2019-11-23 DIAGNOSIS — R140 Abdominal distension (gaseous): Secondary | ICD-10-CM | POA: Diagnosis not present

## 2019-11-23 DIAGNOSIS — R1013 Epigastric pain: Secondary | ICD-10-CM | POA: Diagnosis not present

## 2019-11-23 DIAGNOSIS — R197 Diarrhea, unspecified: Secondary | ICD-10-CM | POA: Diagnosis not present

## 2019-11-23 DIAGNOSIS — I440 Atrioventricular block, first degree: Secondary | ICD-10-CM | POA: Diagnosis not present

## 2019-11-23 DIAGNOSIS — R9431 Abnormal electrocardiogram [ECG] [EKG]: Secondary | ICD-10-CM | POA: Diagnosis not present

## 2019-11-23 DIAGNOSIS — R109 Unspecified abdominal pain: Secondary | ICD-10-CM | POA: Diagnosis not present

## 2019-11-23 DIAGNOSIS — Z9049 Acquired absence of other specified parts of digestive tract: Secondary | ICD-10-CM | POA: Diagnosis not present

## 2019-11-24 DIAGNOSIS — R1013 Epigastric pain: Secondary | ICD-10-CM | POA: Diagnosis not present

## 2019-12-05 ENCOUNTER — Other Ambulatory Visit: Payer: Self-pay | Admitting: Family Medicine

## 2019-12-05 DIAGNOSIS — J3089 Other allergic rhinitis: Secondary | ICD-10-CM | POA: Diagnosis not present

## 2019-12-05 DIAGNOSIS — J301 Allergic rhinitis due to pollen: Secondary | ICD-10-CM | POA: Diagnosis not present

## 2019-12-05 DIAGNOSIS — J3081 Allergic rhinitis due to animal (cat) (dog) hair and dander: Secondary | ICD-10-CM | POA: Diagnosis not present

## 2019-12-10 DIAGNOSIS — J3081 Allergic rhinitis due to animal (cat) (dog) hair and dander: Secondary | ICD-10-CM | POA: Diagnosis not present

## 2019-12-12 DIAGNOSIS — J301 Allergic rhinitis due to pollen: Secondary | ICD-10-CM | POA: Diagnosis not present

## 2019-12-12 DIAGNOSIS — J3081 Allergic rhinitis due to animal (cat) (dog) hair and dander: Secondary | ICD-10-CM | POA: Diagnosis not present

## 2019-12-12 DIAGNOSIS — J3089 Other allergic rhinitis: Secondary | ICD-10-CM | POA: Diagnosis not present

## 2019-12-26 DIAGNOSIS — J301 Allergic rhinitis due to pollen: Secondary | ICD-10-CM | POA: Diagnosis not present

## 2019-12-26 DIAGNOSIS — J3081 Allergic rhinitis due to animal (cat) (dog) hair and dander: Secondary | ICD-10-CM | POA: Diagnosis not present

## 2019-12-26 DIAGNOSIS — J3089 Other allergic rhinitis: Secondary | ICD-10-CM | POA: Diagnosis not present

## 2020-01-07 DIAGNOSIS — J3089 Other allergic rhinitis: Secondary | ICD-10-CM | POA: Diagnosis not present

## 2020-01-07 DIAGNOSIS — J3081 Allergic rhinitis due to animal (cat) (dog) hair and dander: Secondary | ICD-10-CM | POA: Diagnosis not present

## 2020-01-07 DIAGNOSIS — J301 Allergic rhinitis due to pollen: Secondary | ICD-10-CM | POA: Diagnosis not present

## 2020-01-16 DIAGNOSIS — J3089 Other allergic rhinitis: Secondary | ICD-10-CM | POA: Diagnosis not present

## 2020-01-16 DIAGNOSIS — J3081 Allergic rhinitis due to animal (cat) (dog) hair and dander: Secondary | ICD-10-CM | POA: Diagnosis not present

## 2020-01-16 DIAGNOSIS — J301 Allergic rhinitis due to pollen: Secondary | ICD-10-CM | POA: Diagnosis not present

## 2020-01-17 ENCOUNTER — Other Ambulatory Visit: Payer: Self-pay | Admitting: Family Medicine

## 2020-01-17 NOTE — Telephone Encounter (Signed)
Requested medication (s) are due for refill today: yes  Requested medication (s) are on the active medication list: yes  Last refill:  10/07/2019  Future visit scheduled: no  Notes to clinic: overdue for follow up   Requested Prescriptions  Pending Prescriptions Disp Refills   atenolol (TENORMIN) 50 MG tablet [Pharmacy Med Name: ATENOLOL 50 MG TAB] 90 tablet 0    Sig: TAKE 1 TABLET BY MOUTH ONCE DAILY      Cardiovascular:  Beta Blockers Failed - 01/17/2020  1:51 PM      Failed - Last BP in normal range    BP Readings from Last 1 Encounters:  04/24/19 (!) 186/97          Failed - Valid encounter within last 6 months    Recent Outpatient Visits           6 months ago Restless legs syndrome   [facility] Malva Limes, MD   7 months ago Cough   [facility] Flinchum, Eula Fried, FNP   9 months ago Urinary frequency   [facility] Malva Limes, MD   1 year ago Cough   [facility] Malva Limes, MD   1 year ago Acute maxillary sinusitis, recurrence not specified   [facility] Malva Limes, MD              Passed - Last Heart Rate in normal range    Pulse Readings from Last 1 Encounters:  04/24/19 65

## 2020-01-21 DIAGNOSIS — R252 Cramp and spasm: Secondary | ICD-10-CM | POA: Diagnosis not present

## 2020-01-21 DIAGNOSIS — E538 Deficiency of other specified B group vitamins: Secondary | ICD-10-CM | POA: Diagnosis not present

## 2020-01-21 DIAGNOSIS — G2581 Restless legs syndrome: Secondary | ICD-10-CM | POA: Diagnosis not present

## 2020-01-21 DIAGNOSIS — M62838 Other muscle spasm: Secondary | ICD-10-CM | POA: Diagnosis not present

## 2020-01-23 DIAGNOSIS — J3089 Other allergic rhinitis: Secondary | ICD-10-CM | POA: Diagnosis not present

## 2020-01-23 DIAGNOSIS — J301 Allergic rhinitis due to pollen: Secondary | ICD-10-CM | POA: Diagnosis not present

## 2020-01-23 DIAGNOSIS — J3081 Allergic rhinitis due to animal (cat) (dog) hair and dander: Secondary | ICD-10-CM | POA: Diagnosis not present

## 2020-01-30 DIAGNOSIS — J3081 Allergic rhinitis due to animal (cat) (dog) hair and dander: Secondary | ICD-10-CM | POA: Diagnosis not present

## 2020-01-30 DIAGNOSIS — J3089 Other allergic rhinitis: Secondary | ICD-10-CM | POA: Diagnosis not present

## 2020-01-30 DIAGNOSIS — J301 Allergic rhinitis due to pollen: Secondary | ICD-10-CM | POA: Diagnosis not present

## 2020-01-30 DIAGNOSIS — G2581 Restless legs syndrome: Secondary | ICD-10-CM | POA: Diagnosis not present

## 2020-02-06 DIAGNOSIS — L03116 Cellulitis of left lower limb: Secondary | ICD-10-CM | POA: Diagnosis not present

## 2020-02-06 DIAGNOSIS — R21 Rash and other nonspecific skin eruption: Secondary | ICD-10-CM | POA: Diagnosis not present

## 2020-02-20 DIAGNOSIS — J3081 Allergic rhinitis due to animal (cat) (dog) hair and dander: Secondary | ICD-10-CM | POA: Diagnosis not present

## 2020-02-20 DIAGNOSIS — J3089 Other allergic rhinitis: Secondary | ICD-10-CM | POA: Diagnosis not present

## 2020-02-20 DIAGNOSIS — J301 Allergic rhinitis due to pollen: Secondary | ICD-10-CM | POA: Diagnosis not present

## 2020-02-21 ENCOUNTER — Ambulatory Visit: Payer: Medicare Other | Admitting: Family Medicine

## 2020-02-25 DIAGNOSIS — R140 Abdominal distension (gaseous): Secondary | ICD-10-CM | POA: Diagnosis not present

## 2020-02-25 DIAGNOSIS — R1310 Dysphagia, unspecified: Secondary | ICD-10-CM | POA: Diagnosis not present

## 2020-02-25 DIAGNOSIS — G2581 Restless legs syndrome: Secondary | ICD-10-CM | POA: Diagnosis not present

## 2020-02-25 DIAGNOSIS — R790 Abnormal level of blood mineral: Secondary | ICD-10-CM | POA: Diagnosis not present

## 2020-02-25 DIAGNOSIS — K2100 Gastro-esophageal reflux disease with esophagitis, without bleeding: Secondary | ICD-10-CM | POA: Diagnosis not present

## 2020-03-10 NOTE — Progress Notes (Signed)
Subjective:   Heidi Hernandez is a 69 y.o. female who presents for Medicare Annual (Subsequent) preventive examination.  I connected with Heidi Hernandez today by telephone and verified that I am speaking with the correct person using two identifiers. Location patient: home Location provider: work Persons participating in the virtual visit: patient, provider.   I discussed the limitations, risks, security and privacy concerns of performing an evaluation and management service by telephone and the availability of in person appointments. I also discussed with the patient that there may be a patient responsible charge related to this service. The patient expressed understanding and verbally consented to this telephonic visit.    Interactive audio and video telecommunications were attempted between this provider and patient, however failed, due to patient having technical difficulties OR patient did not have access to video capability.  We continued and completed visit with audio only.   Review of Systems    N/A  Cardiac Risk Factors include: hypertension;advanced age (>28men, >36 women)     Objective:    Today's Vitals   03/16/20 1433  PainSc: 5    There is no height or weight on file to calculate BMI.  Advanced Directives 03/16/2020 04/24/2019 03/12/2019 08/31/2018 01/15/2018 11/22/2017 04/21/2017  Does Patient Have a Medical Advance Directive? No No No No No No No  Would patient like information on creating a medical advance directive? No - Patient declined - No - Patient declined - No - Patient declined - No - Patient declined    Current Medications (verified) Outpatient Encounter Medications as of 03/16/2020  Medication Sig   albuterol (VENTOLIN HFA) 108 (90 Base) MCG/ACT inhaler Inhale 2 puffs into the lungs every 6 (six) hours as needed for wheezing. May substitute generic or equivalent brand name   alendronate (FOSAMAX) 70 MG tablet TAKE ONE TABLET BY MOUTH EACH WEEK, ON AN  EMPTY STOMACH BEFORE BREAKFAST WITH 8oz OF WATER AND REMAIN UPRIGHT FOR :30   amLODipine (NORVASC) 2.5 MG tablet Take 1 tablet (2.5 mg total) by mouth daily.   atenolol (TENORMIN) 50 MG tablet TAKE 1 TABLET BY MOUTH ONCE DAILY   azelastine (ASTELIN) 0.1 % nasal spray Place 2 sprays into both nostrils daily.    chlorpheniramine-HYDROcodone (TUSSIONEX PENNKINETIC ER) 10-8 MG/5ML SUER Take 5 mLs by mouth at bedtime as needed for cough (will cause drowsiness).   clonazePAM (KLONOPIN) 0.5 MG tablet TAKE 1 TABLET BY MOUTH AT BEDTIME FOR RESTLESS LEGS   doxycycline (VIBRA-TABS) 100 MG tablet Take 1 tablet (100 mg total) by mouth 2 (two) times daily for 10 days.   EPINEPHrine 0.3 mg/0.3 mL IJ SOAJ injection as needed.   Fexofenadine HCl (ALLEGRA PO) Take 1 capsule by mouth daily.   fluticasone (FLONASE) 50 MCG/ACT nasal spray Place 2 sprays into both nostrils daily.    fluticasone (FLOVENT HFA) 44 MCG/ACT inhaler Inhale 1 puff into the lungs daily.   montelukast (SINGULAIR) 10 MG tablet Take 10 mg by mouth at bedtime.    omeprazole (PRILOSEC) 40 MG capsule Take 40 mg by mouth daily.   pramipexole (MIRAPEX) 0.5 MG tablet Take 1 tablet by mouth 5 (five) times daily.   SUMAtriptan (IMITREX) 100 MG tablet TAKE 1 TABLET BY MOUTH AT ONSET OF HEADACHE. MAY TAKE AN ADDITIONAL TABLET IN 2 HOURS IF NEEDED. MAX OF /24hr   Vitamin D, Ergocalciferol, (DRISDOL) 1.25 MG (50000 UT) CAPS capsule TAKE 1 CAPSULE BY MOUTH ONCE EVERY 7 DAYS   famotidine (PEPCID) 20 MG tablet Take 1 tablet (  20 mg total) by mouth 2 (two) times daily for 10 days. (Patient not taking: Reported on 03/16/2020)   LORazepam (ATIVAN) 0.5 MG tablet TAKE 1/2 TABLET BY MOUTH EVERY 6 HOURS AS NEEDED ANXIETY (Patient not taking: Reported on 03/16/2020)   RABEprazole (ACIPHEX) 20 MG tablet Take 20 mg by mouth 2 (two) times daily.  (Patient not taking: Reported on 03/16/2020)   rOPINIRole (REQUIP) 2 MG tablet Take 1 tablet (2 mg total)  by mouth 4 (four) times daily. (Patient not taking: Reported on 03/16/2020)   terbinafine (LAMISIL) 250 MG tablet Take 2 tablets (500 mg total) by mouth daily. Take for 1 week out of every 4 weeks (Patient not taking: Reported on 03/16/2020)   [DISCONTINUED] chlorpheniramine-HYDROcodone (TUSSIONEX PENNKINETIC ER) 10-8 MG/5ML SUER Take 5 mLs by mouth at bedtime as needed for cough (will cause drowsiness).   [DISCONTINUED] doxycycline (VIBRA-TABS) 100 MG tablet Take 1 tablet (100 mg total) by mouth 2 (two) times daily.   No facility-administered encounter medications on file as of 03/16/2020.    Allergies (verified) Augmentin [amoxicillin-pot clavulanate], Droperidol, Paroxetine hcl, and Morphine   History: Past Medical History:  Diagnosis Date   Anxiety    Asthma    GERD (gastroesophageal reflux disease)    Headache    Migraines   Hypertension    Idiopathic thrombocytopenia purpura (HCC)    Required splenectomy   MVP (mitral valve prolapse)    Neuromuscular disorder (HCC)    Restless Leg Syndrome   Pancreatitis April 2017 [facility] due to cholecystitis.    Pneumonia    06-2015   Past Surgical History:  Procedure Laterality Date   ABDOMINAL HYSTERECTOMY     APPENDECTOMY     CARPAL TUNNEL RELEASE Bilateral 11/19/2014   Procedure: CARPAL TUNNEL RELEASE;  Surgeon: Donato Heinz, MD;  Location: [facility] ORS;  Service: Orthopedics;  Laterality: Bilateral;   CESAREAN SECTION     times 2   CHOLECYSTECTOMY  09/29/2015   [facility]   COLONOSCOPY N/A 05/02/2016   Procedure: COLONOSCOPY;  Surgeon: Scot Jun, MD;  Location: [facility] ENDOSCOPY;  Service: Endoscopy;  Laterality: N/A;   ESOPHAGOGASTRODUODENOSCOPY (EGD) WITH PROPOFOL N/A 05/02/2016   Procedure: ESOPHAGOGASTRODUODENOSCOPY (EGD) WITH PROPOFOL;  Surgeon: Scot Jun, MD;  Location: [facility] ENDOSCOPY;  Service: Endoscopy;  Laterality: N/A;   ESOPHAGOGASTRODUODENOSCOPY (EGD) WITH PROPOFOL N/A 11/22/2017   Procedure:  ESOPHAGOGASTRODUODENOSCOPY (EGD) WITH PROPOFOL;  Surgeon: Scot Jun, MD;  Location: [facility] END[facility]COPY;  Service: Endoscopy;  Laterality: N/A;   HERNIA REPAIR     times 3   JOINT REPLACEMENT     OVARIAN CYST SURGERY     SHOULDER ARTHROSCOPY WITH ROTATOR CUFF REPAIR     times 2   SPLENECTOMY, TOTAL     TOTAL SHOULDER ARTHROPLASTY Right 04/21/2017   Procedure: REVERSE SHOULDER ARTHROPLASTY;  Surgeon: Signa Kell, MD;  Location: [facility] ORS;  Service: Orthopedics;  Laterality: Right;   TRIGGER FINGER RELEASE Right 11/19/2014   Procedure: RELEASE TRIGGER FINGER/A-1 PULLEY;  Surgeon: Donato Heinz, MD;  Location: [facility] ORS;  Service: Orthopedics;  Laterality: Right;   TRIGGER FINGER RELEASE Right 08/12/2015   Procedure: RELEASE TRIGGER LONG FINGER;  Surgeon: Donato Heinz, MD;  Location: [facility] ORS;  Service: Orthopedics;  Laterality: Right;   Family History  Problem Relation Age of Onset   Hypertension Mother    Renal Disease Father        ESRD with HD   Social History   Socioeconomic History  Marital status: Married    Spouse name: Not on file   Number of children: 2   Years of education: Not on file   Highest education level: Bachelor's degree (e.g., BA, AB, BS)  Occupational History   Occupation: Retired  Tobacco Use   Smoking status: Former Smoker    Packs/day: 1.00    Years: 10.00    Pack years: 10.00    Types: Cigarettes    Quit date: 11/10/1979    Years since quitting: 40.3   Smokeless tobacco: Never Used  Vaping Use   Vaping Use: Never used  Substance and Sexual Activity   Alcohol use: Not Currently    Alcohol/week: 0.0 standard drinks   Drug use: No   Sexual activity: Never  Other Topics Concern   Not on file  Social History Narrative   Not on file   Social Determinants of Health   Financial Resource Strain: Low Risk    Difficulty of Paying Living Expenses: Not hard at all  Food Insecurity: No Food Insecurity   Worried About  Programme researcher, broadcasting/film/video in the Last Year: Never true   Ran Out of Food in the Last Year: Never true  Transportation Needs: No Transportation Needs   Lack of Transportation (Medical): No   Lack of Transportation (Non-Medical): No  Physical Activity: Inactive   Days of Exercise per Week: 0 days   Minutes of Exercise per Session: 0 min  Stress: No Stress Concern Present   Feeling of Stress : Not at all  Social Connections: Moderately Integrated   Frequency of Communication with Friends and Family: More than three times a week   Frequency of Social Gatherings with Friends and Family: More than three times a week   Attends Religious Services: More than 4 times per year   Active Member of [employer] or Organizations: No   Attends Engineer, structural: Never   Marital Status: Married    Tobacco Counseling Counseling given: Not Answered   Clinical Intake:  Pre-visit preparation completed: Yes  Pain : 0-10 Pain Score: 5  Pain Type: Acute pain Pain Location: Head (Headache due to Covid.) Pain Descriptors / Indicators: Aching Pain Frequency: Intermittent Pain Relieving Factors: Taking Tylenol or Imitrex as needed for headache.  Pain Relieving Factors: Taking Tylenol or Imitrex as needed for headache.  Nutritional Risks: Nausea/ vomitting/ diarrhea (Diarrhea recently due to Covid.) Diabetes: No  How often do you need to have someone help you when you read instructions, pamphlets, or other written materials from your doctor or pharmacy?: 1 - Never  Diabetic? No  Interpreter Needed?: No  Information entered by :: [facility], LPN   Activities of Daily Living In your present state of health, do you have any difficulty performing the following activities: 03/16/2020  Hearing? N  Vision? Y  Comment Needs a new eye glass prescription.  Difficulty concentrating or making decisions? N  Walking or climbing stairs? N  Dressing or bathing? N  Doing errands, shopping? N    Preparing Food and eating ? N  Using the Toilet? N  In the past six months, have you accidently leaked urine? N  Do you have problems with loss of bowel control? N  Managing your Medications? N  Managing your Finances? N  Housekeeping or managing your Housekeeping? N  Some recent data might be hidden    Patient Care Team: Malva Limes, MD as PCP - General (Family Medicine) Eileen Stanford, MD as Referring Physician (Allergy and Immunology) Dingeldein,  Viviann SpareSteven, MD as Consulting Physician (Ophthalmology) Morene CrockerPotter, Zachary E, MD as Referring Physician (Neurology) Mickle Malloryroley, Granville M, PA-C (Gastroenterology) Idelle CrouchAugustine, Ann, MD as Referring Physician (Neurology)  Indicate any recent Medical Services you may have received from other than [facility] providers in the past year (date may be approximate).     Assessment:   This is a routine wellness examination for Heidi Hernandez.  Hearing/Vision screen No exam data present  Dietary issues and exercise activities discussed: Current Exercise Habits: The patient does not participate in regular exercise at present, Exercise limited by: Other - see comments (Due to being sick with Covid for the past few weeks.)  Goals     DIET - INCREASE WATER INTAKE     Recommend increasing water intake to 4 glasses a day.       Depression Screen PHQ 2/9 Scores 03/16/2020 03/12/2019 08/13/2018 01/15/2018 01/12/2017 01/14/2016 06/08/2015  PHQ - 2 Score 0 0 0 0 0 0 0    Fall Risk Fall Risk  03/16/2020 03/12/2019 08/13/2018 01/15/2018 01/12/2017  Falls in the past year? 0 0 0 No No  Number falls in past yr: 0 0 - - -  Injury with Fall? 0 0 - - -    Any stairs in or around the home? Yes  If so, are there any without handrails? No  Home free of loose throw rugs in walkways, pet beds, electrical cords, etc? Yes  Adequate lighting in your home to reduce risk of falls? Yes   ASSISTIVE DEVICES UTILIZED TO PREVENT FALLS:  Life alert? No  Use of a cane, walker or w/c? No  Grab  bars in the bathroom? No  Shower chair or bench in shower? Yes  Elevated toilet seat or a handicapped toilet? No    Cognitive Function:     6CIT Screen 03/16/2020 03/12/2019 01/12/2017 01/12/2017  What Year? 0 points 0 points 0 points 0 points  What month? 0 points 0 points 0 points 0 points  What time? 0 points 0 points 0 points 0 points  Count back from 20 0 points 0 points 0 points 0 points  Months in reverse 0 points 0 points 0 points -  Repeat phrase 0 points 0 points 2 points -  Total Score 0 0 2 -    Immunizations Immunization History  Administered Date(s) Administered   Fluad Quad(high Dose 65+) 03/29/2019   HiB (PRP-T) 10/19/2016   Meningococcal B, OMV 10/19/2016   Meningococcal Mcv4o 10/19/2016   PFIZER SARS-COV-2 Vaccination 08/18/2019, 09/10/2019   Pneumococcal Conjugate-13 10/19/2016   Pneumococcal Polysaccharide-23 04/14/2009, 01/15/2018   Tdap 05/17/2011    TDAP status: Up to date Flu Vaccine status: Declined, Education has been provided regarding the importance of this vaccine but patient still declined. Advised may receive this vaccine at local pharmacy or Health Dept. Aware to provide a copy of the vaccination record if obtained from local pharmacy or Health Dept. Verbalized acceptance and understanding. Pneumococcal vaccine status: Up to date Covid-19 vaccine status: Completed vaccines  Qualifies for Shingles Vaccine? Yes   Zostavax completed No   Shingrix Completed?: No.    Education has been provided regarding the importance of this vaccine. Patient has been advised to call insurance company to determine out of pocket expense if they have not yet received this vaccine. Advised may also receive vaccine at local pharmacy or Health Dept. Verbalized acceptance and understanding.  Screening Tests Health Maintenance  Topic Date Due   Hepatitis C Screening  Never done   Meningococcal B  Vaccine (1 of 4 - Increased Risk Bexsero 2-dose series) 12/02/1960     INFLUENZA VACCINE  01/26/2020   MAMMOGRAM  04/29/2021   DEXA SCAN  04/29/2021   TETANUS/TDAP  05/16/2021   COLONOSCOPY  05/02/2026   COVID-19 Vaccine  Completed   PNA vac Low Risk Adult  Completed    Health Maintenance  Health Maintenance Due  Topic Date Due   Hepatitis C Screening  Never done   Meningococcal B Vaccine (1 of 4 - Increased Risk Bexsero 2-dose series) 12/02/1960   INFLUENZA VACCINE  01/26/2020    Colorectal cancer screening: Completed 05/02/16. Repeat every 10 years Mammogram status: Completed 04/30/19. Repeat every year Bone Density status: Completed 04/30/19. Results reflect: Bone density results: OSTEOPOROSIS. Repeat every 2 years.  Lung Cancer Screening: (Low Dose CT Chest recommended if Age 50-80 years, 30 pack-year currently smoking OR have quit w/in 15years.) does not qualify.    Additional Screening:  Hepatitis C Screening: does qualify; however declines future order at this time.   Vision Screening: Recommended annual ophthalmology exams for early detection of glaucoma and other disorders of the eye. Is the patient up to date with their annual eye exam?  Yes  Who is the provider or what is the name of the office in which the patient attends annual eye exams? Dr Dingeldein @ [facility] If pt is not established with a provider, would they like to be referred to a provider to establish care? No .   Dental Screening: Recommended annual dental exams for proper oral hygiene  Community Resource Referral / Chronic Care Management: CRR required this visit?  No   CCM required this visit?  No      Plan:     I have personally reviewed and noted the following in the patients chart:    Medical and social history  Use of alcohol, tobacco or illicit drugs   Current medications and supplements  Functional ability and status  Nutritional status  Physical activity  Advanced directives  List of other physicians  Hospitalizations, surgeries, and  ER visits in previous 12 months  Vitals  Screenings to include cognitive, depression, and falls  Referrals and appointments  In addition, I have reviewed and discussed with patient certain preventive protocols, quality metrics, and best practice recommendations. A written personalized care plan for preventive services as well as general preventive health recommendations were provided to patient.     Sheree Lalla [place], [place]   03/17/1942   Nurse Notes: Pt to receive a flu shot this fall. Declined a Hep C lab order at this time.

## 2020-03-11 ENCOUNTER — Other Ambulatory Visit: Payer: Medicare Other

## 2020-03-11 ENCOUNTER — Telehealth: Payer: Self-pay

## 2020-03-11 ENCOUNTER — Other Ambulatory Visit: Payer: Self-pay

## 2020-03-11 ENCOUNTER — Ambulatory Visit: Payer: Self-pay | Admitting: Family Medicine

## 2020-03-11 DIAGNOSIS — U071 COVID-19: Secondary | ICD-10-CM | POA: Diagnosis not present

## 2020-03-11 DIAGNOSIS — Z7951 Long term (current) use of inhaled steroids: Secondary | ICD-10-CM | POA: Diagnosis not present

## 2020-03-11 DIAGNOSIS — Z20822 Contact with and (suspected) exposure to covid-19: Secondary | ICD-10-CM

## 2020-03-11 DIAGNOSIS — J45909 Unspecified asthma, uncomplicated: Secondary | ICD-10-CM | POA: Diagnosis not present

## 2020-03-11 DIAGNOSIS — R05 Cough: Secondary | ICD-10-CM | POA: Diagnosis not present

## 2020-03-11 DIAGNOSIS — R0600 Dyspnea, unspecified: Secondary | ICD-10-CM | POA: Diagnosis not present

## 2020-03-11 DIAGNOSIS — Z03818 Encounter for observation for suspected exposure to other biological agents ruled out: Secondary | ICD-10-CM | POA: Diagnosis not present

## 2020-03-11 DIAGNOSIS — Z23 Encounter for immunization: Secondary | ICD-10-CM | POA: Diagnosis not present

## 2020-03-11 DIAGNOSIS — R0602 Shortness of breath: Secondary | ICD-10-CM | POA: Diagnosis not present

## 2020-03-11 DIAGNOSIS — R509 Fever, unspecified: Secondary | ICD-10-CM | POA: Diagnosis not present

## 2020-03-11 DIAGNOSIS — Z79899 Other long term (current) drug therapy: Secondary | ICD-10-CM | POA: Diagnosis not present

## 2020-03-11 DIAGNOSIS — Z885 Allergy status to narcotic agent status: Secondary | ICD-10-CM | POA: Diagnosis not present

## 2020-03-11 DIAGNOSIS — R918 Other nonspecific abnormal finding of lung field: Secondary | ICD-10-CM | POA: Diagnosis not present

## 2020-03-11 DIAGNOSIS — I10 Essential (primary) hypertension: Secondary | ICD-10-CM | POA: Diagnosis not present

## 2020-03-11 DIAGNOSIS — Z888 Allergy status to other drugs, medicaments and biological substances status: Secondary | ICD-10-CM | POA: Diagnosis not present

## 2020-03-11 MED ORDER — DOXYCYCLINE HYCLATE 100 MG PO TABS: 100.0000 mg | ORAL_TABLET | Freq: Two times a day (BID) | ORAL | 0 refills | Status: AC

## 2020-03-11 NOTE — Telephone Encounter (Signed)
Pt calling to report "Feeling worse." See previous note. States temp now 100.7.   Did go for covid testing this AM. States exposed Saturday, direct exposure.  Pt states "Dr. Sherrie Mustache is well aware of my condition and usually sends a strong antibiotic in right away as I do not have a spleen."   States "I do not think it's covid because I get this all the time." States symptoms onset last night. Coughing up small amount yellowish phlegm. States "Maybe a little wheezing sometimes."   Assured NT would route to practice for PCPs review. Advised ED for any worsening symptoms. Verbalizes understanding.  Please advise  Reason for Disposition  SEVERE coughing spells (e.g., whooping sound after coughing, vomiting after coughing)  Answer Assessment - Initial Assessment Questions 1. ONSET: "When did the cough begin?"      yesterday 2. SEVERITY: "How bad is the cough today?"      severe 3. SPUTUM: "Describe the color of your sputum" (none, dry cough; clear, white, yellow, green)     yellow 4. HEMOPTYSIS: "Are you coughing up any blood?" If so ask: "How much?" (flecks, streaks, tablespoons, etc.)     no 5. DIFFICULTY BREATHING: "Are you having difficulty breathing?" If Yes, ask: "How bad is it?" (e.g., mild, moderate, severe)    - MILD: No SOB at rest, mild SOB with walking, speaks normally in sentences, can lay down, no retractions, pulse < 100.    - MODERATE: SOB at rest, SOB with minimal exertion and prefers to sit, cannot lie down flat, speaks in phrases, mild retractions, audible wheezing, pulse 100-120.    - SEVERE: Very SOB at rest, speaks in single words, struggling to breathe, sitting hunched forward, retractions, pulse > 120      Mild SOB with exertion "I have asthma" 6. FEVER: "Do you have a fever?" If Yes, ask: "What is your temperature, how was it measured, and when did it start?"     100.7  This afternoon 7. CARDIAC HISTORY: "Do you have any history of heart disease?" (e.g., heart attack,  congestive heart failure)      *No Answer* 8. LUNG HISTORY: "Do you have any history of lung disease?"  (e.g., pulmonary embolus, asthma, emphysema)     *No Answer* 9. PE RISK FACTORS: "Do you have a history of blood clots?" (or: recent major surgery, recent prolonged travel, bedridden)     *No Answer* 10. OTHER SYMPTOMS: "Do you have any other symptoms?" (e.g., runny nose, wheezing, chest pain)      Maybe wheezing, not sure.  Protocols used: COUGH - ACUTE PRODUCTIVE-A-AH

## 2020-03-11 NOTE — Telephone Encounter (Signed)
Copied from CRM [id number]. Topic: General - Other >> Mar 11, 2020  8:04 AM Jaquita Rector A wrote: Reason for CRM: Patient called in to inform Dr Sherrie Mustache that she is going to have a covid test this AM but when she woke up she started to have a very croupy cough that she need antibiotics for asking for a call back this AM from the nurse of Dr Sherrie Mustache. Ph# [phone]

## 2020-03-12 ENCOUNTER — Telehealth: Payer: Self-pay | Admitting: Family Medicine

## 2020-03-12 DIAGNOSIS — R918 Other nonspecific abnormal finding of lung field: Secondary | ICD-10-CM | POA: Diagnosis not present

## 2020-03-12 DIAGNOSIS — R0600 Dyspnea, unspecified: Secondary | ICD-10-CM | POA: Diagnosis not present

## 2020-03-12 DIAGNOSIS — R059 Cough, unspecified: Secondary | ICD-10-CM

## 2020-03-12 LAB — NOVEL CORONAVIRUS, NAA: SARS-CoV-2, NAA: NOT DETECTED

## 2020-03-12 LAB — SARS-COV-2, NAA 2 DAY TAT

## 2020-03-12 MED ORDER — HYDROCOD POLST-CPM POLST ER 10-8 MG/5ML PO SUER: 5.0000 mL | Freq: Every evening | ORAL | 0 refills | Status: DC | PRN

## 2020-03-12 NOTE — Telephone Encounter (Signed)
Pt states she got her covid test back and she was covid positive.  Pt states Dr Sherrie Mustache prescribed her doxycycline, thinking she had bronchitis.     Pt wants to know if she should even take the abx, would it even do any good? Pt states she went to ED last night and they have ordered her infusion for today.   Pt states she has a terrible cough. Would like to know if Dr Sherrie Mustache would prescribe a cough med for her.

## 2020-03-12 NOTE — Telephone Encounter (Signed)
It won't help with covid, but may keep her from getting a secondary infection. Since she is so prone to infections I think it would be worth taking.

## 2020-03-16 ENCOUNTER — Telehealth: Payer: Self-pay

## 2020-03-16 ENCOUNTER — Ambulatory Visit (INDEPENDENT_AMBULATORY_CARE_PROVIDER_SITE_OTHER): Payer: Medicare Other

## 2020-03-16 ENCOUNTER — Other Ambulatory Visit: Payer: Self-pay

## 2020-03-16 DIAGNOSIS — Z0000 Encounter for general adult medical examination without abnormal findings: Secondary | ICD-10-CM

## 2020-03-16 NOTE — Telephone Encounter (Signed)
Yes, she can take the antibiotic.

## 2020-03-16 NOTE — Patient Instructions (Signed)
Heidi Hernandez , Thank you for taking time to come for your Medicare Wellness Visit. I appreciate your ongoing commitment to your health goals. Please review the following plan we discussed and let me know if I can assist you in the future.   Screening recommendations/referrals: Colonoscopy: Up to date, due 04/2026 Mammogram: Up to date, due 04/2020 Bone Density: Up to date, due 04/2021 Recommended yearly ophthalmology/optometry visit for glaucoma screening and checkup Recommended yearly dental visit for hygiene and checkup  Vaccinations: Influenza vaccine: Currently due Pneumococcal vaccine: Completed series Tdap vaccine: Up to date, due 04/2021 Shingles vaccine: Shingrix discussed. Please contact your pharmacy for coverage information.     Advanced directives: Advance directive discussed with you today. Even though you declined this today please call our office should you change your mind and we can give you the proper paperwork for you to fill out.  Conditions/risks identified: Recommend to increase water intake to 6-8 8 oz glasses a day.   Next appointment: 06/03/20 @ 10:00 AM with Dr Sherrie Mustache    Preventive Care 65 Years and Older, Female Preventive care refers to lifestyle choices and visits with your health care provider that can promote health and wellness. What does preventive care include?  A yearly physical exam. This is also called an annual well check.  Dental exams once or twice a year.  Routine eye exams. Ask your health care provider how often you should have your eyes checked.  Personal lifestyle choices, including:  Daily care of your teeth and gums.  Regular physical activity.  Eating a healthy diet.  Avoiding tobacco and drug use.  Limiting alcohol use.  Practicing safe sex.  Taking low-dose aspirin every day.  Taking vitamin and mineral supplements as recommended by your health care provider. What happens during an annual well check? The services and  screenings done by your health care provider during your annual well check will depend on your age, overall health, lifestyle risk factors, and family history of disease. Counseling  Your health care provider may ask you questions about your:  Alcohol use.  Tobacco use.  Drug use.  Emotional well-being.  Home and relationship well-being.  Sexual activity.  Eating habits.  History of falls.  Memory and ability to understand (cognition).  Work and work Astronomer.  Reproductive health. Screening  You may have the following tests or measurements:  Height, weight, and BMI.  Blood pressure.  Lipid and cholesterol levels. These may be checked every 5 years, or more frequently if you are over 45 years old.  Skin check.  Lung cancer screening. You may have this screening every year starting at age 57 if you have a 30-pack-year history of smoking and currently smoke or have quit within the past 15 years.  Fecal occult blood test (FOBT) of the stool. You may have this test every year starting at age 46.  Flexible sigmoidoscopy or colonoscopy. You may have a sigmoidoscopy every 5 years or a colonoscopy every 10 years starting at age 29.  Hepatitis C blood test.  Hepatitis B blood test.  Sexually transmitted disease (STD) testing.  Diabetes screening. This is done by checking your blood sugar (glucose) after you have not eaten for a while (fasting). You may have this done every 1-3 years.  Bone density scan. This is done to screen for osteoporosis. You may have this done starting at age 33.  Mammogram. This may be done every 1-2 years. Talk to your health care provider about how often you  should have regular mammograms. Talk with your health care provider about your test results, treatment options, and if necessary, the need for more tests. Vaccines  Your health care provider may recommend certain vaccines, such as:  Influenza vaccine. This is recommended every  year.  Tetanus, diphtheria, and acellular pertussis (Tdap, Td) vaccine. You may need a Td booster every 10 years.  Zoster vaccine. You may need this after age 2.  Pneumococcal 13-valent conjugate (PCV13) vaccine. One dose is recommended after age 63.  Pneumococcal polysaccharide (PPSV23) vaccine. One dose is recommended after age 80. Talk to your health care provider about which screenings and vaccines you need and how often you need them. This information is not intended to replace advice given to you by your health care provider. Make sure you discuss any questions you have with your health care provider. Document Released: 07/10/2015 Document Revised: 03/02/2016 Document Reviewed: 04/14/2015 Elsevier Interactive Patient Education  2017 [employer] Prevention in the Home Falls can cause injuries. They can happen to people of all ages. There are many things you can do to make your home safe and to help prevent falls. What can I do on the outside of my home?  Regularly fix the edges of walkways and driveways and fix any cracks.  Remove anything that might make you trip as you walk through a door, such as a raised step or threshold.  Trim any bushes or trees on the path to your home.  Use bright outdoor lighting.  Clear any walking paths of anything that might make someone trip, such as rocks or tools.  Regularly check to see if handrails are loose or broken. Make sure that both sides of any steps have handrails.  Any raised decks and porches should have guardrails on the edges.  Have any leaves, snow, or ice cleared regularly.  Use sand or salt on walking paths during winter.  Clean up any spills in your garage right away. This includes oil or grease spills. What can I do in the bathroom?  Use night lights.  Install grab bars by the toilet and in the tub and shower. Do not use towel bars as grab bars.  Use non-skid mats or decals in the tub or shower.  If you  need to sit down in the shower, use a plastic, non-slip stool.  Keep the floor dry. Clean up any water that spills on the floor as soon as it happens.  Remove soap buildup in the tub or shower regularly.  Attach bath mats securely with double-sided non-slip rug tape.  Do not have throw rugs and other things on the floor that can make you trip. What can I do in the bedroom?  Use night lights.  Make sure that you have a light by your bed that is easy to reach.  Do not use any sheets or blankets that are too big for your bed. They should not hang down onto the floor.  Have a firm chair that has side arms. You can use this for support while you get dressed.  Do not have throw rugs and other things on the floor that can make you trip. What can I do in the kitchen?  Clean up any spills right away.  Avoid walking on wet floors.  Keep items that you use a lot in easy-to-reach places.  If you need to reach something above you, use a strong step stool that has a grab bar.  Keep electrical cords out  of the way.  Do not use floor polish or wax that makes floors slippery. If you must use wax, use non-skid floor wax.  Do not have throw rugs and other things on the floor that can make you trip. What can I do with my stairs?  Do not leave any items on the stairs.  Make sure that there are handrails on both sides of the stairs and use them. Fix handrails that are broken or loose. Make sure that handrails are as long as the stairways.  Check any carpeting to make sure that it is firmly attached to the stairs. Fix any carpet that is loose or worn.  Avoid having throw rugs at the top or bottom of the stairs. If you do have throw rugs, attach them to the floor with carpet tape.  Make sure that you have a light switch at the top of the stairs and the bottom of the stairs. If you do not have them, ask someone to add them for you. What else can I do to help prevent falls?  Wear shoes  that:  Do not have high heels.  Have rubber bottoms.  Are comfortable and fit you well.  Are closed at the toe. Do not wear sandals.  If you use a stepladder:  Make sure that it is fully opened. Do not climb a closed stepladder.  Make sure that both sides of the stepladder are locked into place.  Ask someone to hold it for you, if possible.  Clearly mark and make sure that you can see:  Any grab bars or handrails.  First and last steps.  Where the edge of each step is.  Use tools that help you move around (mobility aids) if they are needed. These include:  Canes.  Walkers.  Scooters.  Crutches.  Turn on the lights when you go into a dark area. Replace any light bulbs as soon as they burn out.  Set up your furniture so you have a clear path. Avoid moving your furniture around.  If any of your floors are uneven, fix them.  If there are any pets around you, be aware of where they are.  Review your medicines with your doctor. Some medicines can make you feel dizzy. This can increase your chance of falling. Ask your doctor what other things that you can do to help prevent falls. This information is not intended to replace advice given to you by your health care provider. Make sure you discuss any questions you have with your health care provider. Document Released: 04/09/2009 Document Revised: 11/19/2015 Document Reviewed: 07/18/2014 Elsevier Interactive Patient Education  2017 [employer].

## 2020-03-16 NOTE — Telephone Encounter (Signed)
Patient advised that it is okay for her to take antibiotic.

## 2020-03-16 NOTE — Telephone Encounter (Signed)
Copied from CRM [id number]. Topic: General - Other >> Mar 16, 2020 10:12 AM Gwenlyn Fudge wrote: Reason for CRM: Pts husband called stating that the pt has received the infusion. She states that it has become a bronchitis problem and is requesting to know if its okay to take the antibiotic that was sent in for her. Please advise.

## 2020-03-24 DIAGNOSIS — Z03818 Encounter for observation for suspected exposure to other biological agents ruled out: Secondary | ICD-10-CM | POA: Diagnosis not present

## 2020-03-25 ENCOUNTER — Encounter: Payer: Self-pay | Admitting: Oncology

## 2020-03-25 NOTE — Progress Notes (Signed)
Referred by Dr. Hinda Lenis for anemia , patient states she has a lot of problems with restless leg syndrome and would like to know if iron levels could be causing this to get worst.

## 2020-03-26 ENCOUNTER — Encounter: Payer: Self-pay | Admitting: Oncology

## 2020-03-26 ENCOUNTER — Inpatient Hospital Stay: Payer: Medicare Other

## 2020-03-26 ENCOUNTER — Inpatient Hospital Stay: Payer: Medicare Other | Attending: Oncology | Admitting: Oncology

## 2020-03-26 ENCOUNTER — Other Ambulatory Visit: Payer: Self-pay

## 2020-03-26 VITALS — BP 174/75 | HR 64 | Temp 99.1°F | Resp 20 | Ht 66.0 in | Wt 152.4 lb

## 2020-03-26 DIAGNOSIS — I341 Nonrheumatic mitral (valve) prolapse: Secondary | ICD-10-CM | POA: Diagnosis not present

## 2020-03-26 DIAGNOSIS — F419 Anxiety disorder, unspecified: Secondary | ICD-10-CM | POA: Diagnosis not present

## 2020-03-26 DIAGNOSIS — Z87891 Personal history of nicotine dependence: Secondary | ICD-10-CM | POA: Diagnosis not present

## 2020-03-26 DIAGNOSIS — G4700 Insomnia, unspecified: Secondary | ICD-10-CM | POA: Insufficient documentation

## 2020-03-26 DIAGNOSIS — K219 Gastro-esophageal reflux disease without esophagitis: Secondary | ICD-10-CM | POA: Diagnosis not present

## 2020-03-26 DIAGNOSIS — G2581 Restless legs syndrome: Secondary | ICD-10-CM | POA: Insufficient documentation

## 2020-03-26 DIAGNOSIS — D693 Immune thrombocytopenic purpura: Secondary | ICD-10-CM | POA: Diagnosis not present

## 2020-03-26 DIAGNOSIS — Z7951 Long term (current) use of inhaled steroids: Secondary | ICD-10-CM | POA: Insufficient documentation

## 2020-03-26 DIAGNOSIS — J45909 Unspecified asthma, uncomplicated: Secondary | ICD-10-CM | POA: Insufficient documentation

## 2020-03-26 DIAGNOSIS — I10 Essential (primary) hypertension: Secondary | ICD-10-CM | POA: Insufficient documentation

## 2020-03-26 DIAGNOSIS — Z79899 Other long term (current) drug therapy: Secondary | ICD-10-CM | POA: Diagnosis not present

## 2020-03-26 DIAGNOSIS — R790 Abnormal level of blood mineral: Secondary | ICD-10-CM | POA: Diagnosis not present

## 2020-03-26 LAB — CBC WITH DIFFERENTIAL/PLATELET
Abs Immature Granulocytes: 0.02 10*3/uL (ref 0.00–0.07)
Basophils Absolute: 0.1 10*3/uL (ref 0.0–0.1)
Basophils Relative: 1 %
Eosinophils Absolute: 0.2 10*3/uL (ref 0.0–0.5)
Eosinophils Relative: 2 %
HCT: 38.2 % (ref 36.0–46.0)
Hemoglobin: 13.8 g/dL (ref 12.0–15.0)
Immature Granulocytes: 0 %
Lymphocytes Relative: 30 %
Lymphs Abs: 3 10*3/uL (ref 0.7–4.0)
MCH: 32.9 pg (ref 26.0–34.0)
MCHC: 36.1 g/dL — ABNORMAL HIGH (ref 30.0–36.0)
MCV: 91 fL (ref 80.0–100.0)
Monocytes Absolute: 0.8 10*3/uL (ref 0.1–1.0)
Monocytes Relative: 8 %
Neutro Abs: 5.9 10*3/uL (ref 1.7–7.7)
Neutrophils Relative %: 59 %
Platelets: 190 10*3/uL (ref 150–400)
RBC: 4.2 MIL/uL (ref 3.87–5.11)
RDW: 14 % (ref 11.5–15.5)
Smear Review: ADEQUATE
WBC: 10 10*3/uL (ref 4.0–10.5)
nRBC: 0 % (ref 0.0–0.2)

## 2020-03-26 LAB — IRON AND TIBC
Iron: 176 ug/dL — ABNORMAL HIGH (ref 28–170)
Saturation Ratios: 57 % — ABNORMAL HIGH (ref 10.4–31.8)
TIBC: 308 ug/dL (ref 250–450)
UIBC: 132 ug/dL

## 2020-03-26 LAB — COMPREHENSIVE METABOLIC PANEL
ALT: 17 U/L (ref 0–44)
AST: 21 U/L (ref 15–41)
Albumin: 4.3 g/dL (ref 3.5–5.0)
Alkaline Phosphatase: 60 U/L (ref 38–126)
Anion gap: 8 (ref 5–15)
BUN: 24 mg/dL — ABNORMAL HIGH (ref 8–23)
CO2: 24 mmol/L (ref 22–32)
Calcium: 9 mg/dL (ref 8.9–10.3)
Chloride: 108 mmol/L (ref 98–111)
Creatinine, Ser: 0.67 mg/dL (ref 0.44–1.00)
GFR calc Af Amer: >60 mL/min (ref >60)
GFR calc non Af Amer: >60 mL/min (ref >60)
Glucose, Bld: 105 mg/dL — ABNORMAL HIGH (ref 70–99)
Potassium: 4.4 mmol/L (ref 3.5–5.1)
Sodium: 140 mmol/L (ref 135–145)
Total Bilirubin: 0.7 mg/dL (ref 0.3–1.2)
Total Protein: 7.2 g/dL (ref 6.5–8.1)

## 2020-03-26 LAB — FERRITIN: Ferritin: 53 ng/mL (ref 11–307)

## 2020-03-26 NOTE — Progress Notes (Signed)
[facility]  [phone] [phone]  ID: [name] OB: 1950-11-03  MR#: [id number]  KXF#:[id number]  Patient Care Team: Malva Limes, MD as PCP - General (Family Medicine) Eileen Stanford, MD as Referring Physician (Allergy and Immunology) Sallee Lange, MD as Consulting Physician (Ophthalmology) Mickle Mallory (Gastroenterology) Idelle Crouch, MD as Referring Physician (Neurology)  CHIEF COMPLAINT: Elevated serum iron.  INTERVAL HISTORY: Patient is a 69 year old female with significant uncontrolled restless leg syndrome who was noted to have a mildly increased serum iron level and was referred for further evaluation.  She otherwise feels well and is asymptomatic.  She has no other neurologic complaints.  She denies any recent fevers or illnesses.  She has a good appetite and denies weight loss.  She has no chest pain, shortness of breath, cough, or hemoptysis.  She denies any nausea, vomiting, constipation, or diarrhea.  She has no urinary complaints.  Patient otherwise feels well and offers no further specific complaints today.  REVIEW OF SYSTEMS:   Review of Systems  Constitutional: Negative.  Negative for fever, malaise/fatigue and weight loss.  Respiratory: Negative for cough, hemoptysis and shortness of breath.   Cardiovascular: Negative.  Negative for chest pain and leg swelling.  Gastrointestinal: Negative.  Negative for abdominal pain.  Genitourinary: Negative.  Negative for dysuria.  Musculoskeletal: Negative.  Negative for back pain.  Skin: Negative.  Negative for rash.  Neurological: Negative.  Negative for dizziness, focal weakness, weakness and headaches.  Psychiatric/Behavioral: The patient has insomnia.     As per HPI. Otherwise, a complete review of systems is negative.  PAST MEDICAL HISTORY: Past Medical History:  Diagnosis Date  . Anxiety   . Asthma   . GERD (gastroesophageal reflux disease)   .  Headache    Migraines  . Hypertension   . Idiopathic thrombocytopenia purpura (HCC)    Required splenectomy  . MVP (mitral valve prolapse)   . Neuromuscular disorder (HCC)    Restless Leg Syndrome  . Pancreatitis April 2017 [facility] due to cholecystitis.   . Pneumonia    06-2015    PAST SURGICAL HISTORY: Past Surgical History:  Procedure Laterality Date  . ABDOMINAL HYSTERECTOMY    . APPENDECTOMY    . CARPAL TUNNEL RELEASE Bilateral 11/19/2014   Procedure: CARPAL TUNNEL RELEASE;  Surgeon: Donato Heinz, MD;  Location: [facility] ORS;  Service: Orthopedics;  Laterality: Bilateral;  . CESAREAN SECTION     times 2  . CHOLECYSTECTOMY  09/29/2015   [facility]  . COLONOSCOPY N/A 05/02/2016   Procedure: COLONOSCOPY;  Surgeon: Scot Jun, MD;  Location: [facility] ENDOSCOPY;  Service: Endoscopy;  Laterality: N/A;  . ESOPHAGOGASTRODUODENOSCOPY (EGD) WITH PROPOFOL N/A 05/02/2016   Procedure: ESOPHAGOGASTRODUODENOSCOPY (EGD) WITH PROPOFOL;  Surgeon: Scot Jun, MD;  Location: [facility] ENDOSCOPY;  Service: Endoscopy;  Laterality: N/A;  . ESOPHAGOGASTRODUODENOSCOPY (EGD) WITH PROPOFOL N/A 11/22/2017   Procedure: ESOPHAGOGASTRODUODENOSCOPY (EGD) WITH PROPOFOL;  Surgeon: Scot Jun, MD;  Location: [facility] ENDOSCOPY;  Service: Endoscopy;  Laterality: N/A;  . HERNIA REPAIR     times 3  . JOINT REPLACEMENT    . OVARIAN CYST SURGERY    . SHOULDER ARTHROSCOPY WITH ROTATOR CUFF REPAIR     times 2  . SPLENECTOMY, TOTAL    . TOTAL SHOULDER ARTHROPLASTY Right 04/21/2017   Procedure: REVERSE SHOULDER ARTHROPLASTY;  Surgeon: Signa Kell, MD;  Location: [facility] ORS;  Service: Orthopedics;  Laterality: Right;  . TRIGGER FINGER RELEASE Right 11/19/2014  Procedure: RELEASE TRIGGER FINGER/A-1 PULLEY;  Surgeon: Donato Heinz, MD;  Location: [facility] ORS;  Service: Orthopedics;  Laterality: Right;  . TRIGGER FINGER RELEASE Right 08/12/2015   Procedure: RELEASE TRIGGER LONG FINGER;  Surgeon: Donato Heinz, MD;   Location: [facility] ORS;  Service: Orthopedics;  Laterality: Right;    FAMILY HISTORY: Family History  Problem Relation Age of Onset  . Hypertension Mother   . Renal Disease Father        ESRD with HD    ADVANCED DIRECTIVES (Y/N):  N  HEALTH MAINTENANCE: Social History   Tobacco Use  . Smoking status: Former Smoker    Packs/day: 1.00    Years: 10.00    Pack years: 10.00    Types: Cigarettes    Quit date: 11/10/1979    Years since quitting: 40.4  . Smokeless tobacco: Never Used  Vaping Use  . Vaping Use: Never used  Substance Use Topics  . Alcohol use: Not Currently    Alcohol/week: 0.0 standard drinks  . Drug use: No     Colonoscopy:  PAP:  Bone density:  Lipid panel:  Allergies  Allergen Reactions  . Augmentin [Amoxicillin-Pot Clavulanate] Nausea And Vomiting  . Droperidol Other (See Comments)    "Locked jaw" Facial muscles locked "Locked jaw"  . Paroxetine Hcl Other (See Comments)    syncope Passed out   . Morphine Itching    Current Outpatient Medications  Medication Sig Dispense Refill  . albuterol (VENTOLIN HFA) 108 (90 Base) MCG/ACT inhaler Inhale 2 puffs into the lungs every 6 (six) hours as needed for wheezing. May substitute generic or equivalent brand name 18 g 1  . alendronate (FOSAMAX) 70 MG tablet TAKE ONE TABLET BY MOUTH EACH WEEK, ON AN EMPTY STOMACH BEFORE BREAKFAST WITH 8oz OF WATER AND REMAIN UPRIGHT FOR :30 12 tablet 4  . amLODipine (NORVASC) 2.5 MG tablet Take 1 tablet (2.5 mg total) by mouth daily. 90 tablet 3  . atenolol (TENORMIN) 50 MG tablet TAKE 1 TABLET BY MOUTH ONCE DAILY 90 tablet 0  . azelastine (ASTELIN) 0.1 % nasal spray Place 2 sprays into both nostrils daily.     . clonazePAM (KLONOPIN) 0.5 MG tablet TAKE 1 TABLET BY MOUTH AT BEDTIME FOR RESTLESS LEGS 30 tablet 2  . EPINEPHrine 0.3 mg/0.3 mL IJ SOAJ injection as needed.    Marland Kitchen Fexofenadine HCl (ALLEGRA PO) Take 1 capsule by mouth daily.    . fluticasone (FLONASE) 50 MCG/ACT nasal  spray Place 2 sprays into both nostrils daily.     . fluticasone (FLOVENT HFA) 44 MCG/ACT inhaler Inhale 1 puff into the lungs daily.    . metoCLOPramide (REGLAN) 5 MG tablet Take 5 mg by mouth 4 (four) times daily.    . montelukast (SINGULAIR) 10 MG tablet Take 10 mg by mouth at bedtime.     Marland Kitchen omeprazole (PRILOSEC) 40 MG capsule Take 40 mg by mouth daily.    . pramipexole (MIRAPEX) 0.5 MG tablet Take 1 tablet by mouth 5 (five) times daily.    . SUMAtriptan (IMITREX) 100 MG tablet TAKE 1 TABLET BY MOUTH AT ONSET OF HEADACHE. MAY TAKE AN ADDITIONAL TABLET IN 2 HOURS IF NEEDED. MAX OF 200mg /24hr 9 tablet 1  . Vitamin D, Ergocalciferol, (DRISDOL) 1.25 MG (50000 UT) CAPS capsule TAKE 1 CAPSULE BY MOUTH ONCE EVERY 7 DAYS 12 capsule 4  . terbinafine (LAMISIL) 250 MG tablet Take 2 tablets (500 mg total) by mouth daily. Take for 1 week out of every  4 weeks (Patient not taking: Reported on 03/16/2020) 14 tablet 3   No current facility-administered medications for this visit.    OBJECTIVE: Vitals:   03/26/20 1124  BP: (!) 174/75  Pulse: 64  Resp: 20  Temp: 99.1 F (37.3 C)  SpO2: 97%     Body mass index is 24.6 kg/m.    ECOG FS:0 - Asymptomatic  General: Well-developed, well-nourished, no acute distress. Eyes: Pink conjunctiva, anicteric sclera. HEENT: Normocephalic, moist mucous membranes. Lungs: No audible wheezing or coughing. Heart: Regular rate and rhythm. Abdomen: Soft, nontender, no obvious distention. Musculoskeletal: No edema, cyanosis, or clubbing. Neuro: Alert, answering all questions appropriately. Cranial nerves grossly intact. Skin: No rashes or petechiae noted. Psych: Normal affect. Lymphatics: No cervical, calvicular, axillary or inguinal LAD.   LAB RESULTS:  Lab Results  Component Value Date   NA 140 03/26/2020   K 4.4 03/26/2020   CL 108 03/26/2020   CO2 24 03/26/2020   GLUCOSE 105 (H) 03/26/2020   BUN 24 (H) 03/26/2020   CREATININE 0.67 03/26/2020   CALCIUM  9.0 03/26/2020   PROT 7.2 03/26/2020   ALBUMIN 4.3 03/26/2020   AST 21 03/26/2020   ALT 17 03/26/2020   ALKPHOS 60 03/26/2020   BILITOT 0.7 03/26/2020   GFRNONAA >60 03/26/2020   GFRAA >60 03/26/2020    Lab Results  Component Value Date   WBC 10.0 03/26/2020   NEUTROABS 5.9 03/26/2020   HGB 13.8 03/26/2020   HCT 38.2 03/26/2020   MCV 91.0 03/26/2020   PLT 190 03/26/2020   Lab Results  Component Value Date   IRON 176 (H) 03/26/2020   TIBC 308 03/26/2020   IRONPCTSAT 57 (H) 03/26/2020   Lab Results  Component Value Date   FERRITIN 53 03/26/2020     STUDIES: No results found.  ASSESSMENT: Elevated serum iron.  PLAN:    1. Elevated serum iron: Patient noted to have a elevated serum iron as well as elevated iron saturation.  Her hemoglobin and ferritin are within normal limits.  Have ordered hemochromatosis mutation for completeness.  Patient may benefit from phlebotomy in the future, but it is unclear if this will help with her restless leg.  She will have a video assisted telemedicine visit in approximately 3 weeks to discuss the results and any interventions needed. 2.  Restless leg syndrome: Unlikely related to elevated total iron.  Continue follow-up with neurology as scheduled. 3.  Insomnia: Secondary to restless leg.  I spent a total of 45 minutes reviewing chart data, face-to-face evaluation with the patient, counseling and coordination of care as detailed above.   Patient expressed understanding and was in agreement with this plan. She also understands that She can call clinic at any time with any questions, concerns, or complaints.   Cancer Staging No matching staging information was found for the patient.  Jeralyn Ruths, MD   03/26/2020 3:39 PM

## 2020-03-30 LAB — COMP PANEL: LEUKEMIA/LYMPHOMA

## 2020-03-30 LAB — HEMOCHROMATOSIS DNA-PCR(C282Y,H63D)

## 2020-04-09 ENCOUNTER — Ambulatory Visit: Payer: Self-pay

## 2020-04-09 ENCOUNTER — Telehealth: Payer: Self-pay

## 2020-04-09 NOTE — Telephone Encounter (Signed)
Copied from CRM [id number]. Topic: Quick Communication - See Telephone Encounter >> Apr 09, 2020  8:13 AM Aretta Nip wrote: CRM for notification. See Telephone encounter for: 04/09/20.Pt wants CB from Dr F nurse, reminding of no spleen with covid diagnosed Sept 14. Was almost completely over now coughing up darker fleam, coughing all nite last couple nites going into chest as before thinking he may head off with another round of the antibiotic.  Fever of over 100 last nite, denied appt, just wanted to let Dr Sherrie Mustache... would like cb about refill  [phone]

## 2020-04-09 NOTE — Telephone Encounter (Signed)
Pt. Reports she had COVID 19 in September. Started coughing again 2 days ago. Cough is keeping her up at night." I'm trying to not take all of my prescription cough medicine." Mucus is brown. Temp. Today 99.2. Has some chest tightness. Requesting an antibiotic and refill of cough medication. Please advise pt. Declines appointment. Answer Assessment - Initial Assessment Questions 1. ONSET: "When did the cough begin?"      2 days ago 2. SEVERITY: "How bad is the cough today?"      Keeping her up at night 3. SPUTUM: "Describe the color of your sputum" (none, dry cough; clear, white, yellow, green)     Brown 4. HEMOPTYSIS: "Are you coughing up any blood?" If so ask: "How much?" (flecks, streaks, tablespoons, etc.)     No 5. DIFFICULTY BREATHING: "Are you having difficulty breathing?" If Yes, ask: "How bad is it?" (e.g., mild, moderate, severe)    - MILD: No SOB at rest, mild SOB with walking, speaks normally in sentences, can lay down, no retractions, pulse < 100.    - MODERATE: SOB at rest, SOB with minimal exertion and prefers to sit, cannot lie down flat, speaks in phrases, mild retractions, audible wheezing, pulse 100-120.    - SEVERE: Very SOB at rest, speaks in single words, struggling to breathe, sitting hunched forward, retractions, pulse > 120      Mild 6. FEVER: "Do you have a fever?" If Yes, ask: "What is your temperature, how was it measured, and when did it start?"     99.2 7. CARDIAC HISTORY: "Do you have any history of heart disease?" (e.g., heart attack, congestive heart failure)      No 8. LUNG HISTORY: "Do you have any history of lung disease?"  (e.g., pulmonary embolus, asthma, emphysema)     Bronchitis, pnuemonia 9. PE RISK FACTORS: "Do you have a history of blood clots?" (or: recent major surgery, recent prolonged travel, bedridden)     No 10. OTHER SYMPTOMS: "Do you have any other symptoms?" (e.g., runny nose, wheezing, chest pain)       No 11. PREGNANCY: "Is there any  chance you are pregnant?" "When was your last menstrual period?"       No 12. TRAVEL: "Have you traveled out of the country in the last month?" (e.g., travel history, exposures)       No  Protocols used: COUGH - ACUTE PRODUCTIVE-A-AH

## 2020-04-10 ENCOUNTER — Other Ambulatory Visit: Payer: Self-pay | Admitting: Adult Health

## 2020-04-10 ENCOUNTER — Other Ambulatory Visit: Payer: Self-pay

## 2020-04-10 ENCOUNTER — Telehealth (INDEPENDENT_AMBULATORY_CARE_PROVIDER_SITE_OTHER): Payer: Medicare Other | Admitting: Physician Assistant

## 2020-04-10 VITALS — BP 140/75 | Temp 99.2°F | Ht 64.0 in | Wt 145.0 lb

## 2020-04-10 DIAGNOSIS — J40 Bronchitis, not specified as acute or chronic: Secondary | ICD-10-CM

## 2020-04-10 DIAGNOSIS — Z9081 Acquired absence of spleen: Secondary | ICD-10-CM | POA: Diagnosis not present

## 2020-04-10 MED ORDER — HYDROCOD POLST-CPM POLST ER 10-8 MG/5ML PO SUER: 5.0000 mL | Freq: Every evening | ORAL | 0 refills | Status: DC | PRN

## 2020-04-10 MED ORDER — DOXYCYCLINE HYCLATE 100 MG PO TABS: 100.0000 mg | ORAL_TABLET | Freq: Two times a day (BID) | ORAL | 0 refills | Status: AC

## 2020-04-10 MED ORDER — PREDNISONE 20 MG PO TABS: 20.0000 mg | ORAL_TABLET | Freq: Every day | ORAL | 0 refills | Status: AC

## 2020-04-10 NOTE — Progress Notes (Signed)
Established patient visit   Patient: Heidi Hernandez   DOB: 08/20/1950   69 y.o. Female  MRN: [id number] Visit Date: 04/10/2020  Today's healthcare provider: Trey Sailors, PA-C   Chief Complaint  Patient presents with  . Cough  . Wheezing   Subjective    HPI   Patient with history of asplenia and COVID infection one month ago presents with cough x 3 days. She reports highest measured temperature was 100F. She did take some ibuprofen last night. She reports cough and SOB and wheezing. She does take flovent and albuterol inhaler which are not very helpful.      Medications: Outpatient Medications Prior to Visit  Medication Sig  . albuterol (VENTOLIN HFA) 108 (90 Base) MCG/ACT inhaler Inhale 2 puffs into the lungs every 6 (six) hours as needed for wheezing. May substitute generic or equivalent brand name  . alendronate (FOSAMAX) 70 MG tablet TAKE ONE TABLET BY MOUTH EACH WEEK, ON AN EMPTY STOMACH BEFORE BREAKFAST WITH 8oz OF WATER AND REMAIN UPRIGHT FOR :30  . amLODipine (NORVASC) 2.5 MG tablet Take 1 tablet (2.5 mg total) by mouth daily.  Marland Kitchen atenolol (TENORMIN) 50 MG tablet TAKE 1 TABLET BY MOUTH ONCE DAILY  . azelastine (ASTELIN) 0.1 % nasal spray Place 2 sprays into both nostrils daily.   Marland Kitchen EPINEPHrine 0.3 mg/0.3 mL IJ SOAJ injection as needed.  Marland Kitchen Fexofenadine HCl (ALLEGRA PO) Take 1 capsule by mouth daily.  . fluticasone (FLONASE) 50 MCG/ACT nasal spray Place 2 sprays into both nostrils daily.   . fluticasone (FLOVENT HFA) 44 MCG/ACT inhaler Inhale 1 puff into the lungs daily.  . metoCLOPramide (REGLAN) 5 MG tablet Take 5 mg by mouth 4 (four) times daily.  . montelukast (SINGULAIR) 10 MG tablet Take 10 mg by mouth at bedtime.   Marland Kitchen omeprazole (PRILOSEC) 40 MG capsule Take 40 mg by mouth daily.  . pramipexole (MIRAPEX) 0.5 MG tablet Take 1 tablet by mouth 5 (five) times daily.  . SUMAtriptan (IMITREX) 100 MG tablet TAKE 1 TABLET BY MOUTH AT ONSET OF HEADACHE. MAY TAKE AN  ADDITIONAL TABLET IN 2 HOURS IF NEEDED. MAX OF 200mg /24hr  . zolpidem (AMBIEN) 5 MG tablet Take 1 tablet by mouth at bedtime.  . terbinafine (LAMISIL) 250 MG tablet Take 2 tablets (500 mg total) by mouth daily. Take for 1 week out of every 4 weeks (Patient not taking: Reported on 03/16/2020)  . Vitamin D, Ergocalciferol, (DRISDOL) 1.25 MG (50000 UT) CAPS capsule TAKE 1 CAPSULE BY MOUTH ONCE EVERY 7 DAYS (Patient not taking: Reported on 04/10/2020)  . [DISCONTINUED] clonazePAM (KLONOPIN) 0.5 MG tablet TAKE 1 TABLET BY MOUTH AT BEDTIME FOR RESTLESS LEGS   No facility-administered medications prior to visit.    Review of Systems    Objective    BP 140/75   Temp 99.2 F (37.3 C)   Ht 5\' 4"  (1.626 m)   Wt 145 lb (65.8 kg)   BMI 24.89 kg/m    Physical Exam Constitutional:      Appearance: Normal appearance.  Pulmonary:     Effort: Pulmonary effort is normal. No respiratory distress.  Neurological:     Mental Status: She is alert.  Psychiatric:        Mood and Affect: Mood normal.        Behavior: Behavior normal.       No results found for any visits on 04/10/20.  Assessment & Plan    1. Bronchitis  - doxycycline (  VIBRA-TABS) 100 MG tablet; Take 1 tablet (100 mg total) by mouth 2 (two) times daily for 7 days.  Dispense: 14 tablet; Refill: 0 - predniSONE (DELTASONE) 20 MG tablet; Take 1 tablet (20 mg total) by mouth daily with breakfast for 5 days.  Dispense: 5 tablet; Refill: 0 - chlorpheniramine-HYDROcodone (TUSSIONEX PENNKINETIC ER) 10-8 MG/5ML SUER; Take 5 mLs by mouth at bedtime as needed for cough.  Dispense: 140 mL; Refill: 0  2. Asplenia after surgical procedure       I, Trey Sailors, PA-C, have reviewed all documentation for this visit. The documentation on 04/13/20 for the exam, diagnosis, procedures, and orders are all accurate and complete.  The entirety of the information documented in the History of Present Illness, Review of Systems and Physical Exam  were personally obtained by me. Portions of this information were initially documented by Pollard Sink, CMA and reviewed by me for thoroughness and accuracy.        Maryella Shivers  [facility] [phone] (phone) [phone] (fax)  [facility]

## 2020-04-10 NOTE — Patient Instructions (Signed)

## 2020-04-10 NOTE — Progress Notes (Signed)
[facility]  [phone] Fax:([phone]) [phone]  ID: Heidi Hernandez OB: 24-Nov-1950  MR#: [id number]  EVO#:[id number]  Patient Care Team: Malva Limes, MD as PCP - General (Family Medicine) Eileen Stanford, MD as Referring Physician (Allergy and Immunology) Sallee Lange, MD as Consulting Physician (Ophthalmology) Mickle Mallory (Gastroenterology) Idelle Crouch, MD as Referring Physician (Neurology)  I connected with Heidi Hernandez on 04/16/20 at  2:45 PM EDT by video enabled telemedicine visit and verified that I am speaking with the correct person using two identifiers.   I discussed the limitations, risks, security and privacy concerns of performing an evaluation and management service by telemedicine and the availability of in-person appointments. I also discussed with the patient that there may be a patient responsible charge related to this service. The patient expressed understanding and agreed to proceed.   Other persons participating in the visit and their role in the encounter: Patient, MD.  Patient's location: Home. Provider's location: Clinic.  CHIEF COMPLAINT: [name]  INTERVAL HISTORY: Patient agreed to video enabled telemedicine visit for further evaluation, discussion of her laboratory results, and treatment planning.  She continues to have significant issues with restless leg and insomnia, but otherwise feels well.  She has no other neurologic complaints.  She denies any recent fevers or illnesses.  She has a good appetite and denies weight loss.  She has no chest pain, shortness of breath, cough, or hemoptysis.  She denies any nausea, vomiting, constipation, or diarrhea.  She has no urinary complaints.  Patient offers no further specific complaints today.  REVIEW OF SYSTEMS:   Review of Systems  Constitutional: Negative.  Negative for fever, malaise/fatigue and weight loss.  Respiratory: Negative for  cough, hemoptysis and shortness of breath.   Cardiovascular: Negative.  Negative for chest pain and leg swelling.  Gastrointestinal: Negative.  Negative for abdominal pain.  Genitourinary: Negative.  Negative for dysuria.  Musculoskeletal: Negative.  Negative for back pain.  Skin: Negative.  Negative for rash.  Neurological: Negative.  Negative for dizziness, focal weakness, weakness and headaches.  Psychiatric/Behavioral: The patient has insomnia.     As per HPI. Otherwise, a complete review of systems is negative.  PAST MEDICAL HISTORY: Past Medical History:  Diagnosis Date  . Anxiety   . Asthma   . GERD (gastroesophageal reflux disease)   . Headache    Migraines  . Hypertension   . Idiopathic thrombocytopenia purpura (HCC)    Required splenectomy  . MVP (mitral valve prolapse)   . Neuromuscular disorder (HCC)    Restless Leg Syndrome  . Pancreatitis April 2017 [facility] due to cholecystitis.   . Pneumonia    06-2015    PAST SURGICAL HISTORY: Past Surgical History:  Procedure Laterality Date  . ABDOMINAL HYSTERECTOMY    . APPENDECTOMY    . CARPAL TUNNEL RELEASE Bilateral 11/19/2014   Procedure: CARPAL TUNNEL RELEASE;  Surgeon: Donato Heinz, MD;  Location: [facility] ORS;  Service: Orthopedics;  Laterality: Bilateral;  . CESAREAN SECTION     times 2  . CHOLECYSTECTOMY  09/29/2015   [facility]  . COLONOSCOPY N/A 05/02/2016   Procedure: COLONOSCOPY;  Surgeon: Scot Jun, MD;  Location: [facility] ENDOSCOPY;  Service: Endoscopy;  Laterality: N/A;  . ESOPHAGOGASTRODUODENOSCOPY (EGD) WITH PROPOFOL N/A 05/02/2016   Procedure: ESOPHAGOGASTRODUODENOSCOPY (EGD) WITH PROPOFOL;  Surgeon: Scot Jun, MD;  Location: [facility] ENDOSCOPY;  Service: Endoscopy;  Laterality: N/A;  . ESOPHAGOGASTRODUODENOSCOPY (EGD) WITH PROPOFOL N/A 11/22/2017  Procedure: ESOPHAGOGASTRODUODENOSCOPY (EGD) WITH PROPOFOL;  Surgeon: Scot Jun, MD;  Location: [facility] ENDOSCOPY;  Service: Endoscopy;  Laterality:  N/A;  . HERNIA REPAIR     times 3  . JOINT REPLACEMENT    . OVARIAN CYST SURGERY    . SHOULDER ARTHROSCOPY WITH ROTATOR CUFF REPAIR     times 2  . SPLENECTOMY, TOTAL    . TOTAL SHOULDER ARTHROPLASTY Right 04/21/2017   Procedure: REVERSE SHOULDER ARTHROPLASTY;  Surgeon: Signa Kell, MD;  Location: [facility] ORS;  Service: Orthopedics;  Laterality: Right;  . TRIGGER FINGER RELEASE Right 11/19/2014   Procedure: RELEASE TRIGGER FINGER/A-1 PULLEY;  Surgeon: Donato Heinz, MD;  Location: [facility] ORS;  Service: Orthopedics;  Laterality: Right;  . TRIGGER FINGER RELEASE Right 08/12/2015   Procedure: RELEASE TRIGGER LONG FINGER;  Surgeon: Donato Heinz, MD;  Location: [facility] ORS;  Service: Orthopedics;  Laterality: Right;    FAMILY HISTORY: Family History  Problem Relation Age of Onset  . Hypertension Mother   . Renal Disease Father        ESRD with HD    ADVANCED DIRECTIVES (Y/N):  N  HEALTH MAINTENANCE: Social History   Tobacco Use  . Smoking status: Former Smoker    Packs/day: 1.00    Years: 10.00    Pack years: 10.00    Types: Cigarettes    Quit date: 11/10/1979    Years since quitting: 40.4  . Smokeless tobacco: Never Used  Vaping Use  . Vaping Use: Never used  Substance Use Topics  . Alcohol use: Not Currently    Alcohol/week: 0.0 standard drinks  . Drug use: No     Colonoscopy:  PAP:  Bone density:  Lipid panel:  Allergies  Allergen Reactions  . Augmentin [Amoxicillin-Pot Clavulanate] Nausea And Vomiting  . Droperidol Other (See Comments)    "Locked jaw" Facial muscles locked "Locked jaw"  . Paroxetine Hcl Other (See Comments)    syncope Passed out   . Morphine Itching    Current Outpatient Medications  Medication Sig Dispense Refill  . albuterol (VENTOLIN HFA) 108 (90 Base) MCG/ACT inhaler Inhale 2 puffs into the lungs every 6 (six) hours as needed for wheezing. May substitute generic or equivalent brand name 18 g 1  . alendronate (FOSAMAX) 70 MG tablet TAKE  ONE TABLET BY MOUTH EACH WEEK, ON AN EMPTY STOMACH BEFORE BREAKFAST WITH 8oz OF WATER AND REMAIN UPRIGHT FOR :30 12 tablet 4  . amLODipine (NORVASC) 2.5 MG tablet Take 1 tablet (2.5 mg total) by mouth daily. 90 tablet 3  . atenolol (TENORMIN) 50 MG tablet TAKE 1 TABLET BY MOUTH ONCE DAILY 90 tablet 0  . azelastine (ASTELIN) 0.1 % nasal spray Place 2 sprays into both nostrils daily.     . chlorpheniramine-HYDROcodone (TUSSIONEX PENNKINETIC ER) 10-8 MG/5ML SUER Take 5 mLs by mouth at bedtime as needed for cough. 140 mL 0  . doxycycline (VIBRA-TABS) 100 MG tablet Take 1 tablet (100 mg total) by mouth 2 (two) times daily for 7 days. 14 tablet 0  . EPINEPHrine 0.3 mg/0.3 mL IJ SOAJ injection as needed.    Marland Kitchen Fexofenadine HCl (ALLEGRA PO) Take 1 capsule by mouth daily.    . fluticasone (FLONASE) 50 MCG/ACT nasal spray Place 2 sprays into both nostrils daily.     . fluticasone (FLOVENT HFA) 44 MCG/ACT inhaler Inhale 1 puff into the lungs daily.    . metoCLOPramide (REGLAN) 5 MG tablet Take 5 mg by mouth 4 (four) times daily.    Marland Kitchen  montelukast (SINGULAIR) 10 MG tablet Take 10 mg by mouth at bedtime.     Marland Kitchen omeprazole (PRILOSEC) 40 MG capsule Take 40 mg by mouth daily.    . pramipexole (MIRAPEX) 0.5 MG tablet Take 1 tablet by mouth 5 (five) times daily.    . SUMAtriptan (IMITREX) 100 MG tablet TAKE 1 TABLET BY MOUTH AT ONSET OF HEADACHE. MAY TAKE AN ADDITIONAL TABLET IN 2 HOURS IF NEEDED. MAX OF 200mg /24hr 9 tablet 1  . zolpidem (AMBIEN) 5 MG tablet Take 1 tablet by mouth at bedtime.    . terbinafine (LAMISIL) 250 MG tablet Take 2 tablets (500 mg total) by mouth daily. Take for 1 week out of every 4 weeks (Patient not taking: Reported on 03/16/2020) 14 tablet 3  . Vitamin D, Ergocalciferol, (DRISDOL) 1.25 MG (50000 UT) CAPS capsule TAKE 1 CAPSULE BY MOUTH ONCE EVERY 7 DAYS (Patient not taking: Reported on 04/10/2020) 12 capsule 4   No current facility-administered medications for this visit.     OBJECTIVE: There were no vitals filed for this visit.   There is no height or weight on file to calculate BMI.    ECOG FS:0 - Asymptomatic  General: Well-developed, well-nourished, no acute distress. HEENT: Normocephalic. Neuro: Alert, answering all questions appropriately. Cranial nerves grossly intact. Psych: Normal affect.  LAB RESULTS:  Lab Results  Component Value Date   NA 140 03/26/2020   K 4.4 03/26/2020   CL 108 03/26/2020   CO2 24 03/26/2020   GLUCOSE 105 (H) 03/26/2020   BUN 24 (H) 03/26/2020   CREATININE 0.67 03/26/2020   CALCIUM 9.0 03/26/2020   PROT 7.2 03/26/2020   ALBUMIN 4.3 03/26/2020   AST 21 03/26/2020   ALT 17 03/26/2020   ALKPHOS 60 03/26/2020   BILITOT 0.7 03/26/2020   GFRNONAA >60 03/26/2020   GFRAA >60 03/26/2020    Lab Results  Component Value Date   WBC 10.0 03/26/2020   NEUTROABS 5.9 03/26/2020   HGB 13.8 03/26/2020   HCT 38.2 03/26/2020   MCV 91.0 03/26/2020   PLT 190 03/26/2020   Lab Results  Component Value Date   IRON 176 (H) 03/26/2020   TIBC 308 03/26/2020   IRONPCTSAT 57 (H) 03/26/2020   Lab Results  Component Value Date   FERRITIN 53 03/26/2020     STUDIES: No results found.  ASSESSMENT: [name].  PLAN:    1. [name]: Patient is homozygous.  Although her ferritin is within normal limits, her total iron and iron saturation are significantly elevated.  Patient will return to clinic in 1 week and then in 6 weeks for phlebotomy only.  She would then return to clinic in 3 months with repeat laboratory work, further evaluation, and consideration of additional treatment.   2.  Restless leg syndrome: Unlikely related to hemochromatosis, but will see if phlebotomies help.  Return to clinic as above.  3.  Insomnia: Secondary to restless leg.  I provided 30 minutes of face-to-face video visit time during this encounter which included chart review, counseling, and coordination of care as  documented above.   Patient expressed understanding and was in agreement with this plan. She also understands that She can call clinic at any time with any questions, concerns, or complaints.    03/28/2020, MD   04/16/2020 4:43 PM

## 2020-04-10 NOTE — Progress Notes (Signed)
Resent e scribed medication to pharmacy. Rachelle CMA notified provider it did not go through.  Office will fax script.  Allergy to morphine listed, reviewed see where she has been on in past. - listed as low - itching only.   Meds ordered this encounter  Medications  . chlorpheniramine-HYDROcodone (TUSSIONEX PENNKINETIC ER) 10-8 MG/5ML SUER    Sig: Take 5 mLs by mouth at bedtime as needed for cough.    Dispense:  140 mL    Refill:  0    ONLY fill once we were having E prescribe issues and if comes through again please disregard.

## 2020-04-16 ENCOUNTER — Encounter: Payer: Self-pay | Admitting: Oncology

## 2020-04-16 ENCOUNTER — Inpatient Hospital Stay: Payer: Medicare Other | Attending: Oncology | Admitting: Oncology

## 2020-04-16 DIAGNOSIS — R790 Abnormal level of blood mineral: Secondary | ICD-10-CM

## 2020-04-16 NOTE — Progress Notes (Signed)
Patient denies any concerns today.  

## 2020-04-20 DIAGNOSIS — J3089 Other allergic rhinitis: Secondary | ICD-10-CM | POA: Diagnosis not present

## 2020-04-22 ENCOUNTER — Other Ambulatory Visit: Payer: Self-pay

## 2020-04-22 ENCOUNTER — Inpatient Hospital Stay: Payer: Medicare Other

## 2020-04-22 NOTE — Progress Notes (Signed)
Patient monitored x 15 minutes post first phlebotomy. Patient and VSS. Discharged home.

## 2020-04-23 DIAGNOSIS — G2581 Restless legs syndrome: Secondary | ICD-10-CM | POA: Diagnosis not present

## 2020-04-24 ENCOUNTER — Telehealth: Payer: Self-pay

## 2020-04-24 NOTE — Telephone Encounter (Signed)
I haven't referred her to anyone. I don't really know she is talking about.

## 2020-04-24 NOTE — Telephone Encounter (Signed)
Copied from CRM [id number]. Topic: General - Other >> Apr 24, 2020  9:30 AM Dalphine Handing A wrote: Patient would like a callback from Dr. Esmond Camper nurse as to why he placed a referral for her to se Dr .Hinda Lenis. Pateint stated that Dr. Malvin Johns referred her to Dr .Hinda Lenis and she is already her patient. Patient wanted clarification on if there is something that she needed to know. Please advise

## 2020-04-28 NOTE — Telephone Encounter (Signed)
Patient advised.

## 2020-05-01 DIAGNOSIS — J3089 Other allergic rhinitis: Secondary | ICD-10-CM | POA: Diagnosis not present

## 2020-05-01 DIAGNOSIS — J301 Allergic rhinitis due to pollen: Secondary | ICD-10-CM | POA: Diagnosis not present

## 2020-05-01 DIAGNOSIS — J3081 Allergic rhinitis due to animal (cat) (dog) hair and dander: Secondary | ICD-10-CM | POA: Diagnosis not present

## 2020-05-26 DIAGNOSIS — J3089 Other allergic rhinitis: Secondary | ICD-10-CM | POA: Diagnosis not present

## 2020-05-26 DIAGNOSIS — J3081 Allergic rhinitis due to animal (cat) (dog) hair and dander: Secondary | ICD-10-CM | POA: Diagnosis not present

## 2020-05-26 DIAGNOSIS — J301 Allergic rhinitis due to pollen: Secondary | ICD-10-CM | POA: Diagnosis not present

## 2020-06-02 ENCOUNTER — Inpatient Hospital Stay: Payer: Medicare Other | Attending: Oncology

## 2020-06-02 NOTE — Progress Notes (Signed)
Labs reviewed with MD and treatment team. Per MD to continue with phlebotomy today. Pt tolerated phlebotomy well with no signs of complications. RN educated pt on the importance of notifying the clinic if any complications occur at home. Pt observed for 10-15 minutes after phlebotomy with no complications. VSS. Pt stable for discharge. Pt discharged home to be driven home by husband.   [name] [employer]

## 2020-06-03 ENCOUNTER — Ambulatory Visit (INDEPENDENT_AMBULATORY_CARE_PROVIDER_SITE_OTHER): Payer: Medicare Other | Admitting: Family Medicine

## 2020-06-03 ENCOUNTER — Encounter: Payer: Self-pay | Admitting: Family Medicine

## 2020-06-03 ENCOUNTER — Other Ambulatory Visit: Payer: Self-pay

## 2020-06-03 VITALS — BP 144/74 | HR 60 | Temp 98.2°F | Resp 16 | Ht 64.0 in | Wt 152.0 lb

## 2020-06-03 DIAGNOSIS — M810 Age-related osteoporosis without current pathological fracture: Secondary | ICD-10-CM

## 2020-06-03 DIAGNOSIS — E559 Vitamin D deficiency, unspecified: Secondary | ICD-10-CM | POA: Diagnosis not present

## 2020-06-03 DIAGNOSIS — E538 Deficiency of other specified B group vitamins: Secondary | ICD-10-CM | POA: Insufficient documentation

## 2020-06-03 DIAGNOSIS — I10 Essential (primary) hypertension: Secondary | ICD-10-CM | POA: Diagnosis not present

## 2020-06-03 DIAGNOSIS — R635 Abnormal weight gain: Secondary | ICD-10-CM

## 2020-06-03 DIAGNOSIS — G43819 Other migraine, intractable, without status migrainosus: Secondary | ICD-10-CM

## 2020-06-03 DIAGNOSIS — R1900 Intra-abdominal and pelvic swelling, mass and lump, unspecified site: Secondary | ICD-10-CM | POA: Diagnosis not present

## 2020-06-03 DIAGNOSIS — Z23 Encounter for immunization: Secondary | ICD-10-CM

## 2020-06-03 DIAGNOSIS — Z0000 Encounter for general adult medical examination without abnormal findings: Secondary | ICD-10-CM

## 2020-06-03 MED ORDER — AMLODIPINE BESYLATE 5 MG PO TABS: 5.0000 mg | ORAL_TABLET | Freq: Every day | ORAL | 5 refills | Status: DC

## 2020-06-03 MED ORDER — SUMATRIPTAN SUCCINATE 100 MG PO TABS: ORAL_TABLET | ORAL | 2 refills | Status: DC

## 2020-06-03 MED ORDER — ATENOLOL 50 MG PO TABS: 25.0000 mg | ORAL_TABLET | Freq: Every day | ORAL | 0 refills | Status: DC

## 2020-06-03 NOTE — Patient Instructions (Signed)
.   Please review the attached list of medications and notify my office if there are any errors.   . Put omeprazole and Fosamax on hold for about a month and take OTC famotidine 20mg  once a day to see it helps with bloating

## 2020-06-03 NOTE — Progress Notes (Signed)
I,Roshena L Chambers,acting as a scribe for [name], MD.,have documented all relevant documentation on the behalf of [name], MD,as directed by  [name], MD while in the presence of [name], MD.   Complete physical exam   Patient: Heidi Hernandez   DOB: 25-Dec-1950   69 y.o. Female  MRN: [id number] Visit Date: 06/03/2020  Today's healthcare provider: [name], MD   Chief Complaint  Patient presents with  . Annual Exam  . Hypertension   Subjective    Heidi Hernandez is a 69 y.o. female who presents today for a complete physical exam.  She reports consuming a general diet. Home exercise routine includes walking. She generally feels fairly well. She reports sleeping poorly due to restless legs. She does not have additional problems to discuss today.   Had AWV with [facility] on 03/16/2020.    HPI  Hypertension, follow-up  BP Readings from Last 3 Encounters:  06/03/20 (!) 144/74  06/02/20 137/67  04/22/20 (!) 126/59   Wt Readings from Last 3 Encounters:  06/03/20 152 lb (68.9 kg)  04/10/20 145 lb (65.8 kg)  03/26/20 152 lb 6.4 oz (69.1 kg)     She was last seen for hypertension 1 year ago.  BP at that visit was 122/72. Management since that visit includes continuing same medications.  She reports good compliance with treatment. She is not having side effects.  She is following a Regular diet. She is exercising. She does not smoke.  Use of agents associated with hypertension: none.   Outside blood pressures are checked and average 160/ 75. Symptoms: No chest pain No chest pressure  No palpitations No syncope  No dyspnea No orthopnea  No paroxysmal nocturnal dyspnea No lower extremity edema   Pertinent labs: Lab Results  Component Value Date   CHOL 155 01/15/2018   HDL 60 01/15/2018   LDLCALC 75 01/15/2018   TRIG 100 01/15/2018   CHOLHDL 2.6 01/15/2018   Lab Results  Component Value Date   NA 140 03/26/2020   K 4.4 03/26/2020    CREATININE 0.67 03/26/2020   GFRNONAA >60 03/26/2020   GFRAA >60 03/26/2020   GLUCOSE 105 (H) 03/26/2020     The 10-year ASCVD risk score Denman George DC Jr., et al., 2013) is: 16.1%   ---------------------------------------------------------------------------------------------------  Follow up for Vitamin D deficiency:  The patient was last seen for this 1 year ago. Changes made at last visit include none; continue Vitamin D supplement.  She reports good compliance with treatment. She feels that condition is Unchanged. She is not having side effects.   -----------------------------------------------------------------------------------------   Follow up for Hypokalemia:  The patient was last seen for this 1 year ago. Changes made at last visit include none.  She reports good compliance with treatment. She feels that condition is Unchanged. She is not having side effects.   -----------------------------------------------------------------------------------------  Follow up for osteoporosis:  The patient was last seen for this 1 year ago. Changes made at last visit include none; continue taking alendronate.  She reports good compliance with treatment. She feels that condition is Unchanged. She is not having side effects.   -----------------------------------------------------------------------------------------  She has also been experiencing abdominal swelling, pain and bloating for several months. Was seen at [facility] in May and had unremarkable abdominal and pelvic CT. Been follow up at [facility] and is scheduled for additional GI tests at [facility] in January. Denies any changes in bowels. She states she had 'everything' removed when  she had hysterectomy many years ago.    Past Medical History:  Diagnosis Date  . Anxiety   . Asthma   . GERD (gastroesophageal reflux disease)   . Headache    Migraines  . Hypertension   . Idiopathic thrombocytopenia purpura (HCC)    Required  splenectomy  . MVP (mitral valve prolapse)   . Neuromuscular disorder (HCC)    Restless Leg Syndrome  . Pancreatitis April 2017 [facility] due to cholecystitis.   . Pneumonia    06-2015   Past Surgical History:  Procedure Laterality Date  . ABDOMINAL HYSTERECTOMY    . APPENDECTOMY    . CARPAL TUNNEL RELEASE Bilateral 11/19/2014   Procedure: CARPAL TUNNEL RELEASE;  Surgeon: Donato Heinz, MD;  Location: [facility] ORS;  Service: Orthopedics;  Laterality: Bilateral;  . CESAREAN SECTION     times 2  . CHOLECYSTECTOMY  09/29/2015   [facility]  . COLONOSCOPY N/A 05/02/2016   Procedure: COLONOSCOPY;  Surgeon: Scot Jun, MD;  Location: [facility] ENDOSCOPY;  Service: Endoscopy;  Laterality: N/A;  . ESOPHAGOGASTRODUODENOSCOPY (EGD) WITH PROPOFOL N/A 05/02/2016   Procedure: ESOPHAGOGASTRODUODENOSCOPY (EGD) WITH PROPOFOL;  Surgeon: Scot Jun, MD;  Location: [facility] ENDOSCOPY;  Service: Endoscopy;  Laterality: N/A;  . ESOPHAGOGASTRODUODENOSCOPY (EGD) WITH PROPOFOL N/A 11/22/2017   Procedure: ESOPHAGOGASTRODUODENOSCOPY (EGD) WITH PROPOFOL;  Surgeon: Scot Jun, MD;  Location: [facility] ENDOSCOPY;  Service: Endoscopy;  Laterality: N/A;  . HERNIA REPAIR     times 3  . JOINT REPLACEMENT    . OVARIAN CYST SURGERY    . SHOULDER ARTHROSCOPY WITH ROTATOR CUFF REPAIR     times 2  . SPLENECTOMY, TOTAL    . TOTAL SHOULDER ARTHROPLASTY Right 04/21/2017   Procedure: REVERSE SHOULDER ARTHROPLASTY;  Surgeon: Signa Kell, MD;  Location: [facility] ORS;  Service: Orthopedics;  Laterality: Right;  . TRIGGER FINGER RELEASE Right 11/19/2014   Procedure: RELEASE TRIGGER FINGER/A-1 PULLEY;  Surgeon: Donato Heinz, MD;  Location: [facility] ORS;  Service: Orthopedics;  Laterality: Right;  . TRIGGER FINGER RELEASE Right 08/12/2015   Procedure: RELEASE TRIGGER LONG FINGER;  Surgeon: Donato Heinz, MD;  Location: [facility] ORS;  Service: Orthopedics;  Laterality: Right;   Social History   Socioeconomic History  . Marital status:  Married    Spouse name: Not on file  . Number of children: 2  . Years of education: Not on file  . Highest education level: Bachelor's degree (e.g., BA, AB, BS)  Occupational History  . Occupation: Retired  Tobacco Use  . Smoking status: Former Smoker    Packs/day: 1.00    Years: 10.00    Pack years: 10.00    Types: Cigarettes    Quit date: 11/10/1979    Years since quitting: 40.5  . Smokeless tobacco: Never Used  Vaping Use  . Vaping Use: Never used  Substance and Sexual Activity  . Alcohol use: Not Currently    Alcohol/week: 0.0 standard drinks  . Drug use: No  . Sexual activity: Never  Other Topics Concern  . Not on file  Social History Narrative  . Not on file   Social Determinants of Health   Financial Resource Strain: Low Risk   . Difficulty of Paying Living Expenses: Not hard at all  Food Insecurity: No Food Insecurity  . Worried About Programme researcher, broadcasting/film/video in the Last Year: Never true  . Ran Out of Food in the Last Year: Never true  Transportation Needs: No Transportation Needs  . Lack  of Transportation (Medical): No  . Lack of Transportation (Non-Medical): No  Physical Activity: Inactive  . Days of Exercise per Week: 0 days  . Minutes of Exercise per Session: 0 min  Stress: No Stress Concern Present  . Feeling of Stress : Not at all  Social Connections: Moderately Integrated  . Frequency of Communication with Friends and Family: More than three times a week  . Frequency of Social Gatherings with Friends and Family: More than three times a week  . Attends Religious Services: More than 4 times per year  . Active Member of Clubs or Organizations: No  . Attends Banker Meetings: Never  . Marital Status: Married  Catering manager Violence: Not At Risk  . Fear of Current or Ex-Partner: No  . Emotionally Abused: No  . Physically Abused: No  . Sexually Abused: No   Family Status  Relation Name Status  . Mother  Deceased at age 60       aortic  aneurysm  . Father  Deceased at age 81       Prostate Cancer  . Brother  Alive       ITP  . Brother  Alive       ITP   Family History  Problem Relation Age of Onset  . Hypertension Mother   . Renal Disease Father        ESRD with HD   Allergies  Allergen Reactions  . Augmentin [Amoxicillin-Pot Clavulanate] Nausea And Vomiting  . Droperidol Other (See Comments)    "Locked jaw" Facial muscles locked "Locked jaw"  . Paroxetine Hcl Other (See Comments)    syncope Passed out   . Morphine Itching    Patient Care Team: Malva Limes, MD as PCP - General (Family Medicine) Eileen Stanford, MD as Referring Physician (Allergy and Immunology) Sallee Lange, MD as Consulting Physician (Ophthalmology) Mickle Mallory (Gastroenterology) Idelle Crouch, MD as Referring Physician (Neurology)   Medications: Outpatient Medications Prior to Visit  Medication Sig  . albuterol (VENTOLIN HFA) 108 (90 Base) MCG/ACT inhaler Inhale 2 puffs into the lungs every 6 (six) hours as needed for wheezing. May substitute generic or equivalent brand name  . alendronate (FOSAMAX) 70 MG tablet TAKE ONE TABLET BY MOUTH EACH WEEK, ON AN EMPTY STOMACH BEFORE BREAKFAST WITH 8oz OF WATER AND REMAIN UPRIGHT FOR :30  . amLODipine (NORVASC) 2.5 MG tablet Take 1 tablet (2.5 mg total) by mouth daily.  Marland Kitchen atenolol (TENORMIN) 50 MG tablet TAKE 1 TABLET BY MOUTH ONCE DAILY  . azelastine (ASTELIN) 0.1 % nasal spray Place 2 sprays into both nostrils daily.   . chlorpheniramine-HYDROcodone (TUSSIONEX PENNKINETIC ER) 10-8 MG/5ML SUER Take 5 mLs by mouth at bedtime as needed for cough.  . EPINEPHrine 0.3 mg/0.3 mL IJ SOAJ injection as needed.  Marland Kitchen Fexofenadine HCl (ALLEGRA PO) Take 1 capsule by mouth daily.  . fluticasone (FLONASE) 50 MCG/ACT nasal spray Place 2 sprays into both nostrils daily.   . fluticasone (FLOVENT HFA) 44 MCG/ACT inhaler Inhale 1 puff into the lungs daily.  . montelukast (SINGULAIR) 10 MG  tablet Take 10 mg by mouth at bedtime.   Marland Kitchen omeprazole (PRILOSEC) 40 MG capsule Take 40 mg by mouth daily.  . pramipexole (MIRAPEX) 0.5 MG tablet Take 1 tablet by mouth 5 (five) times daily.  . SUMAtriptan (IMITREX) 100 MG tablet TAKE 1 TABLET BY MOUTH AT ONSET OF HEADACHE. MAY TAKE AN ADDITIONAL TABLET IN 2 HOURS IF NEEDED. MAX OF 200mg /24hr  .  terbinafine (LAMISIL) 250 MG tablet Take 2 tablets (500 mg total) by mouth daily. Take for 1 week out of every 4 weeks  . Vitamin D, Ergocalciferol, (DRISDOL) 1.25 MG (50000 UT) CAPS capsule TAKE 1 CAPSULE BY MOUTH ONCE EVERY 7 DAYS  . zolpidem (AMBIEN) 5 MG tablet Take 1 tablet by mouth at bedtime.  . [DISCONTINUED] metoCLOPramide (REGLAN) 5 MG tablet Take 5 mg by mouth 4 (four) times daily. (Patient not taking: Reported on 06/03/2020)   No facility-administered medications prior to visit.    Review of Systems  Constitutional: Negative for chills, fatigue and fever.  HENT: Negative for congestion, ear pain, rhinorrhea, sneezing and sore throat.   Eyes: Negative.  Negative for pain and redness.  Respiratory: Negative for cough, shortness of breath and wheezing.   Cardiovascular: Negative for chest pain and leg swelling.  Gastrointestinal: Positive for abdominal distention (bloating for more than 1 year). Negative for abdominal pain, blood in stool, constipation, diarrhea and nausea.  Endocrine: Negative for polydipsia and polyphagia.  Genitourinary: Negative.  Negative for dysuria, flank pain, hematuria, pelvic pain, vaginal bleeding and vaginal discharge.  Musculoskeletal: Negative for arthralgias, back pain, gait problem and joint swelling.  Skin: Negative for rash.  Neurological: Negative.  Negative for dizziness, tremors, seizures, weakness, light-headedness, numbness and headaches.  Hematological: Negative for adenopathy.  Psychiatric/Behavioral: Negative.  Negative for behavioral problems, confusion and dysphoric mood. The patient is not  nervous/anxious and is not hyperactive.       Objective    BP (!) 144/74 (BP Location: Left Arm, Patient Position: Sitting, Cuff Size: Normal)   Pulse 60   Temp 98.2 F (36.8 C) (Oral)   Resp 16   Ht 5\' 4"  (1.626 m)   Wt 152 lb (68.9 kg)   BMI 26.09 kg/m    Physical Exam   General Appearance:     Overweight female. Alert, cooperative, in no acute distress, appears stated age   Head:    Normocephalic, without obvious abnormality, atraumatic  Eyes:    PERRL, conjunctiva/corneas clear, EOM's intact, fundi    benign, both eyes  Ears:    Normal TM's and external ear canals, both ears  Nose:   Nares normal, septum midline, mucosa normal, no drainage    or sinus tenderness  Throat:   Lips, mucosa, and tongue normal; teeth and gums normal  Neck:   Supple, symmetrical, trachea midline, no adenopathy;    thyroid:  no enlargement/tenderness/nodules; no carotid   bruit or JVD  Back:     Symmetric, no curvature, ROM normal, no CVA tenderness  Lungs:     Clear to auscultation bilaterally, respirations unlabored  Chest Wall:    No tenderness or deformity   Heart:    Normal heart rate. Normal rhythm.  2/6  Breast Exam:    normal appearance, no masses or tenderness  Abdomen:     Soft, non-tender, bowel sounds active all four quadrants,    no masses, no organomegaly  Pelvic:    deferred and not indicated; status post hysterectomy, negative ROS  Extremities:   All extremities are intact. No cyanosis or edema  Pulses:   2+ and symmetric all extremities  Skin:   Skin color, texture, turgor normal, no rashes or lesions  Lymph nodes:   Cervical, supraclavicular, and axillary nodes normal  Neurologic:   CNII-XII intact, normal strength, sensation and reflexes    throughout    Last depression screening scores PHQ 2/9 Scores 03/16/2020 03/12/2019 08/13/2018  PHQ -  2 Score 0 0 0   Last fall risk screening Fall Risk  03/16/2020  Falls in the past year? 0  Number falls in past yr: 0  Injury  with Fall? 0   Last Audit-C alcohol use screening Alcohol Use Disorder Test (AUDIT) 03/16/2020  1. How often do you have a drink containing alcohol? 0  2. How many drinks containing alcohol do you have on a typical day when you are drinking? 0  3. How often do you have six or more drinks on one occasion? 0  AUDIT-C Score 0  Alcohol Brief Interventions/Follow-up AUDIT Score <7 follow-up not indicated   A score of 3 or more in women, and 4 or more in men indicates increased risk for alcohol abuse, EXCEPT if all of the points are from question 1   No results found for any visits on 06/03/20.  Assessment & Plan    Routine Health Maintenance and Physical Exam  Exercise Activities and Dietary recommendations Goals    . DIET - INCREASE WATER INTAKE     Recommend increasing water intake to 4 glasses a day.        Immunization History  Administered Date(s) Administered  . Fluad Quad(high Dose 65+) 03/29/2019, 06/03/2020  . HiB (PRP-T) 10/19/2016  . Meningococcal B, OMV 10/19/2016  . Meningococcal Mcv4o 10/19/2016  . PFIZER SARS-COV-2 Vaccination 08/18/2019, 09/10/2019  . Pneumococcal Conjugate-13 10/19/2016  . Pneumococcal Polysaccharide-23 04/14/2009, 01/15/2018  . Tdap 05/17/2011    Health Maintenance  Topic Date Due  . Hepatitis C Screening  Never done  . Meningococcal B Vaccine (1 of 4 - Increased Risk Bexsero 2-dose series) 12/02/1960  . MAMMOGRAM  04/29/2021  . DEXA SCAN  04/29/2021  . TETANUS/TDAP  05/16/2021  . COLONOSCOPY  05/02/2026  . INFLUENZA VACCINE  Completed  . COVID-19 Vaccine  Completed  . PNA vac Low Risk Adult  Completed    Discussed health benefits of physical activity, and encouraged her to engage in regular exercise appropriate for her age and condition.  1. Essential hypertension Not ideal controlled. Although she has read that atenolol can cause bloating and interested in reducing dose. Will reduce atenolol to 1/2 daily and increase amlodipine to   daily - Comprehensive metabolic panel - CBC  2. Osteoporosis without current pathological fracture, unspecified osteoporosis type On alendronate 2014 through 2018. Restarted 12/2017. Considering potential for GI side effects will put on hold for the next months.   3. Vitamin D deficiency Check - VITAMIN D 25 Hydroxy (Vit-D Deficiency, Fractures)  4. Abdominal swelling   5. Abnormal weight gain Gi evaluation ongoing. UTD on colonoscopy. No explanation on abd/pelvic CT at [facility] in May 2021 Check - TSH- T4, free Put alendronate on hold as above. Put omeprazole on hold for a month and replace with famotidine. Reduce atenolol as above. Follow up GI as scheduled. Consider abdominal ultrasound.   6. Other migraine without status migrainosus, intractable Rarely requires- SUMAtriptan (IMITREX) 100 MG tablet; TAKE 1 TABLET BY MOUTH AT ONSET OF HEADACHE. MAY TAKE AN ADDITIONAL TABLET IN 2 HOURS IF NEEDED. MAX OF /24hr  Dispense: 9 tablet; Refilled: 2  7. Need for influenza vaccination  - Flu Vaccine QUAD High Dose IM (Fluad)       The entirety of the information documented in the History of Present Illness, Review of Systems and Physical Exam were personally obtained by me. Portions of this information were initially documented by the CMA and reviewed by me for thoroughness and accuracy.  Lelon Huh, MD  [facility] [phone] (phone) [phone] (fax)  [place]

## 2020-06-04 DIAGNOSIS — I10 Essential (primary) hypertension: Secondary | ICD-10-CM | POA: Diagnosis not present

## 2020-06-04 DIAGNOSIS — J301 Allergic rhinitis due to pollen: Secondary | ICD-10-CM | POA: Diagnosis not present

## 2020-06-04 DIAGNOSIS — J3081 Allergic rhinitis due to animal (cat) (dog) hair and dander: Secondary | ICD-10-CM | POA: Diagnosis not present

## 2020-06-04 DIAGNOSIS — E559 Vitamin D deficiency, unspecified: Secondary | ICD-10-CM | POA: Diagnosis not present

## 2020-06-04 DIAGNOSIS — J3089 Other allergic rhinitis: Secondary | ICD-10-CM | POA: Diagnosis not present

## 2020-06-05 ENCOUNTER — Ambulatory Visit: Payer: Self-pay | Admitting: *Deleted

## 2020-06-05 LAB — TSH: TSH: 1.74 u[IU]/mL (ref 0.450–4.500)

## 2020-06-05 LAB — CBC
Hematocrit: 35.7 % (ref 34.0–46.6)
Hemoglobin: 12.3 g/dL (ref 11.1–15.9)
MCH: 32.8 pg (ref 26.6–33.0)
MCHC: 34.5 g/dL (ref 31.5–35.7)
MCV: 95 fL (ref 79–97)
Platelets: 155 10*3/uL (ref 150–450)
RBC: 3.75 x10E6/uL — ABNORMAL LOW (ref 3.77–5.28)
RDW: 13.2 % (ref 11.7–15.4)
WBC: 8.3 10*3/uL (ref 3.4–10.8)

## 2020-06-05 LAB — T4, FREE: Free T4: 1 ng/dL (ref 0.82–1.77)

## 2020-06-05 LAB — COMPREHENSIVE METABOLIC PANEL
ALT: 16 IU/L (ref 0–32)
AST: 20 IU/L (ref 0–40)
Albumin/Globulin Ratio: 2 (ref 1.2–2.2)
Albumin: 4.3 g/dL (ref 3.7–4.7)
Alkaline Phosphatase: 70 IU/L (ref 44–121)
BUN/Creatinine Ratio: 25 (ref 12–28)
BUN: 18 mg/dL (ref 8–27)
Bilirubin Total: 0.3 mg/dL (ref 0.0–1.2)
CO2: 22 mmol/L (ref 20–29)
Calcium: 9.1 mg/dL (ref 8.7–10.3)
Chloride: 109 mmol/L — ABNORMAL HIGH (ref 96–106)
Creatinine, Ser: 0.71 mg/dL (ref 0.57–1.00)
GFR calc Af Amer: 99 mL/min/{1.73_m2} (ref >59)
GFR calc non Af Amer: 86 mL/min/{1.73_m2} (ref >59)
Globulin, Total: 2.2 g/dL (ref 1.5–4.5)
Glucose: 96 mg/dL (ref 65–99)
Potassium: 4.1 mmol/L (ref 3.5–5.2)
Sodium: 143 mmol/L (ref 134–144)
Total Protein: 6.5 g/dL (ref 6.0–8.5)

## 2020-06-05 LAB — VITAMIN D 25 HYDROXY (VIT D DEFICIENCY, FRACTURES): Vit D, 25-Hydroxy: 29.5 ng/mL — ABNORMAL LOW (ref 30.0–100.0)

## 2020-06-05 NOTE — Telephone Encounter (Signed)
Spoke with patient and she reports that she still has the bloating sensation. She denies changes in bowels or urinary issues. Denies changes in her diet. Denies nausea or vomiting. Occasional GERD symptoms, but reports that they are intermittent. She also is feeling tired. She wanted to know if these symptoms could have anything to do with her thyroid? I advised that her thyroid levels were normal, but she wanted me to ask if this could be related? Please advise. Thanks!

## 2020-06-05 NOTE — Telephone Encounter (Signed)
Pt called to review My Chart results of recent labs that was viewed by patient on 06/05/20 at 10:26 am and reviewed lab results per notes of Dr. Sherrie Mustache on 06/05/20. Pt verbalized understanding but is still concerned she remains tired and bloated. Patient reports she is going to continue taking Vitamin D supplement every day.

## 2020-06-05 NOTE — Telephone Encounter (Signed)
I think that's what she is seeing the gastroenterologist for in January. If there were any problems with the thyroid it would have shown up on the bloodwork.

## 2020-06-05 NOTE — Telephone Encounter (Signed)
Please advise 

## 2020-06-08 DIAGNOSIS — G2581 Restless legs syndrome: Secondary | ICD-10-CM | POA: Diagnosis not present

## 2020-06-08 DIAGNOSIS — M62838 Other muscle spasm: Secondary | ICD-10-CM | POA: Diagnosis not present

## 2020-06-08 DIAGNOSIS — R252 Cramp and spasm: Secondary | ICD-10-CM | POA: Diagnosis not present

## 2020-06-09 NOTE — Telephone Encounter (Signed)
Patient advised and verbalized understanding 

## 2020-06-11 DIAGNOSIS — J3081 Allergic rhinitis due to animal (cat) (dog) hair and dander: Secondary | ICD-10-CM | POA: Diagnosis not present

## 2020-06-11 DIAGNOSIS — J3089 Other allergic rhinitis: Secondary | ICD-10-CM | POA: Diagnosis not present

## 2020-06-11 DIAGNOSIS — J301 Allergic rhinitis due to pollen: Secondary | ICD-10-CM | POA: Diagnosis not present

## 2020-06-16 DIAGNOSIS — J4530 Mild persistent asthma, uncomplicated: Secondary | ICD-10-CM | POA: Diagnosis not present

## 2020-06-16 DIAGNOSIS — J3089 Other allergic rhinitis: Secondary | ICD-10-CM | POA: Diagnosis not present

## 2020-06-16 DIAGNOSIS — L299 Pruritus, unspecified: Secondary | ICD-10-CM | POA: Diagnosis not present

## 2020-06-16 DIAGNOSIS — J301 Allergic rhinitis due to pollen: Secondary | ICD-10-CM | POA: Diagnosis not present

## 2020-06-16 DIAGNOSIS — J3081 Allergic rhinitis due to animal (cat) (dog) hair and dander: Secondary | ICD-10-CM | POA: Diagnosis not present

## 2020-06-21 ENCOUNTER — Emergency Department
Admission: EM | Admit: 2020-06-21 | Discharge: 2020-06-21 | Disposition: A | Discharge status: Home / Self Care | Payer: Medicare Other | Attending: Emergency Medicine | Admitting: Emergency Medicine

## 2020-06-21 ENCOUNTER — Encounter: Payer: Self-pay | Admitting: Emergency Medicine

## 2020-06-21 ENCOUNTER — Other Ambulatory Visit: Payer: Self-pay

## 2020-06-21 ENCOUNTER — Emergency Department: Payer: Medicare Other

## 2020-06-21 DIAGNOSIS — Z87891 Personal history of nicotine dependence: Secondary | ICD-10-CM | POA: Diagnosis not present

## 2020-06-21 DIAGNOSIS — B9689 Other specified bacterial agents as the cause of diseases classified elsewhere: Secondary | ICD-10-CM | POA: Diagnosis not present

## 2020-06-21 DIAGNOSIS — J019 Acute sinusitis, unspecified: Secondary | ICD-10-CM | POA: Insufficient documentation

## 2020-06-21 DIAGNOSIS — R059 Cough, unspecified: Secondary | ICD-10-CM

## 2020-06-21 DIAGNOSIS — Z96611 Presence of right artificial shoulder joint: Secondary | ICD-10-CM | POA: Diagnosis not present

## 2020-06-21 DIAGNOSIS — Z7951 Long term (current) use of inhaled steroids: Secondary | ICD-10-CM | POA: Insufficient documentation

## 2020-06-21 DIAGNOSIS — Z79899 Other long term (current) drug therapy: Secondary | ICD-10-CM | POA: Diagnosis not present

## 2020-06-21 DIAGNOSIS — R0981 Nasal congestion: Secondary | ICD-10-CM

## 2020-06-21 DIAGNOSIS — Z20822 Contact with and (suspected) exposure to covid-19: Secondary | ICD-10-CM | POA: Diagnosis not present

## 2020-06-21 DIAGNOSIS — J45909 Unspecified asthma, uncomplicated: Secondary | ICD-10-CM | POA: Insufficient documentation

## 2020-06-21 DIAGNOSIS — J3489 Other specified disorders of nose and nasal sinuses: Secondary | ICD-10-CM | POA: Diagnosis not present

## 2020-06-21 DIAGNOSIS — R0602 Shortness of breath: Secondary | ICD-10-CM | POA: Diagnosis not present

## 2020-06-21 DIAGNOSIS — I10 Essential (primary) hypertension: Secondary | ICD-10-CM | POA: Diagnosis not present

## 2020-06-21 LAB — CBC
HCT: 34.9 % — ABNORMAL LOW (ref 36.0–46.0)
Hemoglobin: 11.9 g/dL — ABNORMAL LOW (ref 12.0–15.0)
MCH: 32.6 pg (ref 26.0–34.0)
MCHC: 34.1 g/dL (ref 30.0–36.0)
MCV: 95.6 fL (ref 80.0–100.0)
Platelets: 162 10*3/uL (ref 150–400)
RBC: 3.65 MIL/uL — ABNORMAL LOW (ref 3.87–5.11)
RDW: 14.5 % (ref 11.5–15.5)
WBC: 13.9 10*3/uL — ABNORMAL HIGH (ref 4.0–10.5)
nRBC: 0 % (ref 0.0–0.2)

## 2020-06-21 LAB — RESP PANEL BY RT-PCR (FLU A&B, COVID) ARPGX2
Influenza A by PCR: NEGATIVE
Influenza B by PCR: NEGATIVE
SARS Coronavirus 2 by RT PCR: NEGATIVE

## 2020-06-21 LAB — BASIC METABOLIC PANEL
Anion gap: 7 (ref 5–15)
BUN: 21 mg/dL (ref 8–23)
CO2: 22 mmol/L (ref 22–32)
Calcium: 8.7 mg/dL — ABNORMAL LOW (ref 8.9–10.3)
Chloride: 110 mmol/L (ref 98–111)
Creatinine, Ser: 0.62 mg/dL (ref 0.44–1.00)
GFR, Estimated: >60 mL/min (ref >60)
Glucose, Bld: 107 mg/dL — ABNORMAL HIGH (ref 70–99)
Potassium: 3.8 mmol/L (ref 3.5–5.1)
Sodium: 139 mmol/L (ref 135–145)

## 2020-06-21 MED ORDER — DOXYCYCLINE HYCLATE 100 MG PO TABS
100.0000 mg | ORAL_TABLET | Freq: Once | ORAL | Status: AC
  Administered 2020-06-21: 100 mg via ORAL
  Filled 2020-06-21: qty 1

## 2020-06-21 MED ORDER — DOXYCYCLINE HYCLATE 50 MG PO CAPS: 100.0000 mg | ORAL_CAPSULE | Freq: Two times a day (BID) | ORAL | 0 refills | Status: AC

## 2020-06-21 MED ORDER — ALBUTEROL SULFATE HFA 108 (90 BASE) MCG/ACT IN AERS: 2.0000 | INHALATION_SPRAY | Freq: Four times a day (QID) | RESPIRATORY_TRACT | 2 refills | Status: DC | PRN

## 2020-06-21 NOTE — ED Notes (Signed)
Pt ambulated to bathroom to void. Assisted by tech. Pt had steady gait. Back in  Bed.

## 2020-06-21 NOTE — ED Provider Notes (Signed)
[facility] Emergency Department Provider Note   ____________________________________________   Event Date/Time   First MD Initiated Contact with Patient 06/21/20 1716     (approximate)  I have reviewed the triage vital signs and the nursing notes.   HISTORY  Chief Complaint Shortness of Breath    HPI Heidi Hernandez is a 69 y.o. female with stated past medical history of asthma, hypertension, mitral valve prolapse, and anxiety who presents for productive cough, sinus congestion, and shortness of breath.  Patient states that she has had similar symptoms in the past when she has had bronchitis or pneumonia.  Patient has not tried any medications or antibiotics for the symptoms.  Patient denies any exacerbating or relieving factors.  Patient denies any recent travel or sick contacts.  Patient currently denies any vision changes, tinnitus, difficulty speaking, facial droop, sore throat, chest pain, abdominal pain, nausea/vomiting/diarrhea, dysuria, or weakness/numbness/paresthesias in any extremity         Past Medical History:  Diagnosis Date  . Anxiety   . Asthma   . GERD (gastroesophageal reflux disease)   . Headache    Migraines  . Hypertension   . Idiopathic thrombocytopenia purpura (HCC)    Required splenectomy  . MVP (mitral valve prolapse)   . Neuromuscular disorder (HCC)    Restless Leg Syndrome  . Pancreatitis April 2017 [facility] due to cholecystitis.   . Pneumonia    06-2015    Patient Active Problem List   Diagnosis Date Noted  . B12 deficiency 06/03/2020  . [name] (HCC) 04/16/2020  . Essential hypertension 05/02/2019  . Chest pain with high risk for cardiac etiology 05/02/2019  . Aortic ectasia, thoracic (HCC) 04/29/2019  . Acquired immunocompromised state (HCC) 08/14/2018  . Status post shoulder replacement 04/21/2017  . Rupture of right distal biceps tendon 01/13/2017  . Gallstone pancreatitis  10/15/2015  . Chondrocalcinosis of left ankle 12/24/2014  . Allergic rhinitis 12/23/2014  . Anxiety 12/23/2014  . Asplenia after surgical procedure 12/23/2014  . History of ITP 12/23/2014  . OP (osteoporosis) 12/23/2014  . Restless legs syndrome 12/23/2014  . Neuralgia neuritis, sciatic nerve 12/23/2014  . Actinic keratosis 12/23/2014  . Mitral valve prolapse 12/23/2014  . LVH (left ventricular hypertrophy) 12/23/2014  . Carpal tunnel syndrome 11/19/2014  . Trigger finger, acquired 11/19/2014  . Vitamin D deficiency 09/28/2012  . Dysthymia 04/14/2009  . Hypertension 04/14/2009  . Headache, migraine 04/14/2009  . Artificial menopause 04/14/2009  . Insomnia 04/14/2009  . Cardiac murmur 04/14/2009    Past Surgical History:  Procedure Laterality Date  . ABDOMINAL HYSTERECTOMY    . APPENDECTOMY    . CARPAL TUNNEL RELEASE Bilateral 11/19/2014   Procedure: CARPAL TUNNEL RELEASE;  Surgeon: Donato Heinz, MD;  Location: [facility] ORS;  Service: Orthopedics;  Laterality: Bilateral;  . CESAREAN SECTION     times 2  . CHOLECYSTECTOMY  09/29/2015   [facility]  . COLONOSCOPY N/A 05/02/2016   Procedure: COLONOSCOPY;  Surgeon: Scot Jun, MD;  Location: [facility] ENDOSCOPY;  Service: Endoscopy;  Laterality: N/A;  . ESOPHAGOGASTRODUODENOSCOPY (EGD) WITH PROPOFOL N/A 05/02/2016   Procedure: ESOPHAGOGASTRODUODENOSCOPY (EGD) WITH PROPOFOL;  Surgeon: Scot Jun, MD;  Location: [facility] ENDOSCOPY;  Service: Endoscopy;  Laterality: N/A;  . ESOPHAGOGASTRODUODENOSCOPY (EGD) WITH PROPOFOL N/A 11/22/2017   Procedure: ESOPHAGOGASTRODUODENOSCOPY (EGD) WITH PROPOFOL;  Surgeon: Scot Jun, MD;  Location: [facility] ENDOSCOPY;  Service: Endoscopy;  Laterality: N/A;  . HERNIA REPAIR     times 3  .  JOINT REPLACEMENT    . OVARIAN CYST SURGERY    . SHOULDER ARTHROSCOPY WITH ROTATOR CUFF REPAIR     times 2  . SPLENECTOMY, TOTAL    . TOTAL SHOULDER ARTHROPLASTY Right 04/21/2017   Procedure: REVERSE SHOULDER  ARTHROPLASTY;  Surgeon: Signa Kell, MD;  Location: [facility] ORS;  Service: Orthopedics;  Laterality: Right;  . TRIGGER FINGER RELEASE Right 11/19/2014   Procedure: RELEASE TRIGGER FINGER/A-1 PULLEY;  Surgeon: Donato Heinz, MD;  Location: [facility] ORS;  Service: Orthopedics;  Laterality: Right;  . TRIGGER FINGER RELEASE Right 08/12/2015   Procedure: RELEASE TRIGGER LONG FINGER;  Surgeon: Donato Heinz, MD;  Location: [facility] ORS;  Service: Orthopedics;  Laterality: Right;    Prior to Admission medications   Medication Sig Start Date End Date Taking? Authorizing Provider  albuterol (VENTOLIN HFA) 108 (90 Base) MCG/ACT inhaler Inhale 2 puffs into the lungs every 6 (six) hours as needed for wheezing. May substitute generic or equivalent brand name 07/07/15   Malva Limes, MD  albuterol (VENTOLIN HFA) 108 (90 Base) MCG/ACT inhaler Inhale 2 puffs into the lungs every 6 (six) hours as needed for wheezing or shortness of breath. 06/21/20   Merwyn Katos, MD  alendronate (FOSAMAX) 70 MG tablet TAKE ONE TABLET BY MOUTH EACH WEEK, ON AN EMPTY STOMACH BEFORE BREAKFAST WITH 8oz OF WATER AND REMAIN UPRIGHT FOR :30 03/27/19   Malva Limes, MD  amLODipine (NORVASC) 5 MG tablet Take 1 tablet (5 mg total) by mouth daily. 06/03/20   Malva Limes, MD  atenolol (TENORMIN) 50 MG tablet Take 0.5 tablets (25 mg total) by mouth daily. 06/03/20   Malva Limes, MD  azelastine (ASTELIN) 0.1 % nasal spray Place 2 sprays into both nostrils daily.  12/24/13   [provider]  chlorpheniramine-HYDROcodone (TUSSIONEX PENNKINETIC ER) 10-8 MG/5ML SUER Take 5 mLs by mouth at bedtime as needed for cough. 04/10/20   Flinchum, Eula Fried, FNP  doxycycline (VIBRAMYCIN) 50 MG capsule Take 2 capsules (100 mg total) by mouth 2 (two) times daily for 5 days. 06/21/20 06/26/20  Merwyn Katos, MD  EPINEPHrine 0.3 mg/0.3 mL IJ SOAJ injection as needed. 12/25/18   [provider]  Fexofenadine HCl (ALLEGRA PO) Take 1  capsule by mouth daily.    [provider]  fluticasone (FLONASE) 50 MCG/ACT nasal spray Place 2 sprays into both nostrils daily.  12/05/14   [provider]  fluticasone (FLOVENT HFA) 44 MCG/ACT inhaler Inhale 1 puff into the lungs daily.    [provider]  montelukast (SINGULAIR) 10 MG tablet Take 10 mg by mouth at bedtime.  12/24/13   [provider]  omeprazole (PRILOSEC) 40 MG capsule Take 40 mg by mouth daily. 11/22/17   [provider]  pramipexole (MIRAPEX) 0.5 MG tablet Take 1 tablet by mouth 5 (five) times daily. 02/22/20   [provider]  SUMAtriptan (IMITREX) 100 MG tablet TAKE 1 TABLET BY MOUTH AT ONSET OF HEADACHE. MAY TAKE AN ADDITIONAL TABLET IN 2 HOURS IF NEEDED. MAX OF 200mg 06/03/20   14/8/21, MD  terbinafine (LAMISIL) 250 MG tablet Take 2 tablets (500 mg total) by mouth daily. Take for 1 week out of every 4 weeks 03/29/19   05/29/19, MD  Vitamin D, Ergocalciferol, (DRISDOL) 1.25 MG (50000 UT) CAPS capsule TAKE 1 CAPSULE BY MOUTH ONCE EVERY 7 DAYS 03/27/19   03/29/19, MD  zolpidem (AMBIEN) 5 MG tablet Take 1 tablet by  mouth at bedtime. 02/27/20   [provider]    Allergies Augmentin [amoxicillin-pot clavulanate], Droperidol, Paroxetine hcl, and Morphine  Family History  Problem Relation Age of Onset  . Hypertension Mother   . Renal Disease Father        ESRD with HD    Social History Social History   Tobacco Use  . Smoking status: Former Smoker    Packs/day: 1.00    Years: 10.00    Pack years: 10.00    Types: Cigarettes    Quit date: 11/10/1979    Years since quitting: 40.6  . Smokeless tobacco: Never Used  Vaping Use  . Vaping Use: Never used  Substance Use Topics  . Alcohol use: Not Currently    Alcohol/week: 0.0 standard drinks  . Drug use: No    Review of Systems Constitutional: Endorses fever/chills Eyes: No visual changes. ENT: No sore throat.  Rhinorrhea,  productive cough Cardiovascular: Denies chest pain. Respiratory: Endorses shortness of breath. Gastrointestinal: No abdominal pain.  No nausea, no vomiting.  No diarrhea. Genitourinary: Negative for dysuria. Musculoskeletal: Negative for acute arthralgias Skin: Negative for rash. Neurological: Positive for headaches, negative for weakness/numbness/paresthesias in any extremity Psychiatric: Negative for suicidal ideation/homicidal ideation   ____________________________________________   PHYSICAL EXAM:  VITAL SIGNS: ED Triage Vitals [06/21/20 1519]  Enc Vitals Group     BP (!) 156/76     Pulse Rate 92     Resp 18     Temp 98.8 F (37.1 C)     Temp Source Oral     SpO2 98 %     Weight 145 lb (65.8 kg)     Height 5\' 4"  (1.626 m)     Head Circumference      Peak Flow      Pain Score 0     Pain Loc      Pain Edu?      Excl. in GC?    Constitutional: Alert and oriented. Well appearing and in no acute distress. Eyes: Conjunctivae are normal. PERRL. Head: Atraumatic. Nose: Sinus congestion/rhinnorhea. Mouth/Throat: Mucous membranes are moist. Neck: No stridor Cardiovascular: Grossly normal heart sounds.  Good peripheral circulation. Respiratory: Normal respiratory effort.  No retractions. Gastrointestinal: Soft and nontender. No distention. Musculoskeletal: No obvious deformities Neurologic:  Normal speech and language. No gross focal neurologic deficits are appreciated. Skin:  Skin is warm and dry. No rash noted. Psychiatric: Mood and affect are normal. Speech and behavior are normal.  ____________________________________________   LABS (all labs ordered are listed, but only abnormal results are displayed)  Labs Reviewed  BASIC METABOLIC PANEL - Abnormal; Notable for the following components:      Result Value   Glucose, Bld 107 (*)    Calcium 8.7 (*)    All other components within normal limits  CBC - Abnormal; Notable for the following components:   WBC 13.9  (*)    RBC 3.65 (*)    Hemoglobin 11.9 (*)    HCT 34.9 (*)    All other components within normal limits  RESP PANEL BY RT-PCR (FLU A&B, COVID) ARPGX2  RESP PANEL BY RT-PCR (FLU A&B, COVID) ARPGX2    RADIOLOGY  ED MD interpretation: 2 view x-ray of the chest shows no evidence of acute abnormalities including no pneumonia, pneumothorax, widened mediastinum  Official radiology report(s): DG Chest 2 View  Result Date: 06/21/2020 CLINICAL DATA:  Cough and shortness of breath two days. EXAM: CHEST - 2 VIEW COMPARISON:  04/24/2019 FINDINGS: Lungs  are adequately inflated without focal airspace consolidation or effusion. Cardiomediastinal silhouette and remainder of the exam is unchanged. IMPRESSION: No active cardiopulmonary disease. Electronically Signed   By: Elberta Fortis M.D.   On: 06/21/2020 15:57    ____________________________________________   PROCEDURES  Procedure(s) performed (including Critical Care):  .1-3 Lead EKG Interpretation Performed by: Merwyn Katos, MD Authorized by: Merwyn Katos, MD     Interpretation: normal     ECG rate:  81   ECG rate assessment: normal     Rhythm: sinus rhythm     Ectopy: none     Conduction: normal       ____________________________________________   INITIAL IMPRESSION / ASSESSMENT AND PLAN / ED COURSE  As part of my medical decision making, I reviewed the following data within the electronic MEDICAL RECORD NUMBER Nursing notes reviewed and incorporated, Labs reviewed, Old chart reviewed, Radiograph reviewed and Notes from prior ED visits reviewed and incorporated        Otherwise healthy patient presenting with constellation of symptoms likely representing bacterial sinusitis given fevers, leukocytosis, and productive cough with green sputum without evidence of pneumonia on x-ray  Unlikely PTA/RPA: no hot potato voice, no uvular deviation, Unlikely Esophageal rupture: No history of dysphagia Unlikely deep space  infection/Ludwigs Low suspicion for CNS infection bacterial sinusitis, or pneumonia given exam and history.  Unlikely Strep or EBV as centor negative and with no pharyngeal exudate, posterior LAD, or splenomegaly.  Will attempt to alleviate symptoms conservatively; no overt indications at this time for antibiotics. No respiratory distress, otherwise relatively well appearing and nontoxic. Will discuss prompt follow up with PMD and strict return precautions.      ____________________________________________   FINAL CLINICAL IMPRESSION(S) / ED DIAGNOSES  Final diagnoses:  Acute bacterial sinusitis  Rhinorrhea  Sinus congestion  SOB (shortness of breath)  Cough     ED Discharge Orders         Ordered    doxycycline (VIBRAMYCIN) 50 MG capsule  2 times daily        06/21/20 1912    albuterol (VENTOLIN HFA) 108 (90 Base) MCG/ACT inhaler  Every 6 hours PRN        06/21/20 1912           Note:  This document was prepared using Dragon voice recognition software and may include unintentional dictation errors.   Merwyn Katos, MD 06/21/20 Jerene Bears

## 2020-06-21 NOTE — ED Triage Notes (Signed)
Pt to ER with c/o cough, congestion and mild SHOB for several days.  Pt states SHOB has increased today.  Pt states cough is productive of green sputum.  Pt speaking in complete sentences, NAD noted.

## 2020-06-21 NOTE — ED Notes (Addendum)
Pt states began coughing last night. Pt has asthma but ran out of PRN albuterol inhaler. Pt had dry cough for past few days, then last night cough became productive with greenish sputum. Hx bronchitis and PNA several times. Had covid several months ago. Pt in NAD, speaking in full sentences. SPO2 100% on RA.

## 2020-06-29 ENCOUNTER — Ambulatory Visit (INDEPENDENT_AMBULATORY_CARE_PROVIDER_SITE_OTHER): Payer: Medicare Other | Admitting: Family Medicine

## 2020-06-29 ENCOUNTER — Encounter: Payer: Self-pay | Admitting: Family Medicine

## 2020-06-29 ENCOUNTER — Other Ambulatory Visit: Payer: Self-pay

## 2020-06-29 DIAGNOSIS — R059 Cough, unspecified: Secondary | ICD-10-CM

## 2020-06-29 DIAGNOSIS — J40 Bronchitis, not specified as acute or chronic: Secondary | ICD-10-CM | POA: Diagnosis not present

## 2020-06-29 MED ORDER — LEVOFLOXACIN 750 MG PO TABS: 750.0000 mg | ORAL_TABLET | Freq: Every day | ORAL | 0 refills | Status: AC

## 2020-06-29 MED ORDER — HYDROCOD POLST-CPM POLST ER 10-8 MG/5ML PO SUER
5.0000 mL | Freq: Every evening | ORAL | 0 refills | Status: DC | PRN
Start: 2020-06-29 — End: 2021-06-25

## 2020-06-29 NOTE — Progress Notes (Signed)
Virtual telephone visit    Virtual Visit via Telephone Note   This visit type was conducted due to national recommendations for restrictions regarding the COVID-19 Pandemic (e.g. social distancing) in an effort to limit this patient's exposure and mitigate transmission in our community. Due to her co-morbid illnesses, this patient is at least at moderate risk for complications without adequate follow up. This format is felt to be most appropriate for this patient at this time. The patient did not have access to video technology or had technical difficulties with video requiring transitioning to audio format only (telephone). Physical exam was limited to content and character of the telephone converstion.    Patient location: Home Provider location: [facility]  I discussed the limitations of evaluation and management by telemedicine and the availability of in person appointments. The patient expressed understanding and agreed to proceed.   Visit Date: 06/29/2020  Today's healthcare provider: [name], MD   Chief Complaint  Patient presents with  . ER follow up    Subjective    HPI  Follow up ER visit  Patient was seen in ER for sinus congestion, cough, and shortness of breath  on 06/21/2020. She was treated for Acute bacterial sinusitis. She had a negative chest XR Treatment for this included doxycycline 100mg  BID X 5 days. She reports good compliance with treatment. She reports this condition is Unchanged.  Did not improve at all since starting doxycycline. Was also refilled albuterol which helps some with shortness of breath, but not helping cough.   She is coughing up green mucous. Mild sinus congestion, but no drainage. No fevers. No dyspnea. Similar to previous episodes of bronchitis.       Medications: Outpatient Medications Prior to Visit  Medication Sig  . albuterol (VENTOLIN HFA) 108 (90 Base) MCG/ACT inhaler Inhale 2 puffs into the lungs every 6 (six) hours as  needed for wheezing. May substitute generic or equivalent brand name  . albuterol (VENTOLIN HFA) 108 (90 Base) MCG/ACT inhaler Inhale 2 puffs into the lungs every 6 (six) hours as needed for wheezing or shortness of breath.  alendronate (FOSAMAX) 70 MG tablet TAKE ONE TABLET BY MOUTH EACH WEEK, ON AN EMPTY STOMACH BEFORE BREAKFAST WITH 8oz OF WATER AND REMAIN UPRIGHT FOR :30  . amLODipine (NORVASC) 5 MG tablet Take 1 tablet (5 mg total) by mouth daily.  Marland Kitchen atenolol (TENORMIN) 50 MG tablet Take 0.5 tablets (25 mg total) by mouth daily.  Marland Kitchen azelastine (ASTELIN) 0.1 % nasal spray Place 2 sprays into both nostrils daily.   . chlorpheniramine-HYDROcodone (TUSSIONEX PENNKINETIC ER) 10-8 MG/5ML SUER Take 5 mLs by mouth at bedtime as needed for cough.  . EPINEPHrine 0.3 mg/0.3 mL IJ SOAJ injection as needed.  Marland Kitchen Fexofenadine HCl (ALLEGRA PO) Take 1 capsule by mouth daily.  . fluticasone (FLONASE) 50 MCG/ACT nasal spray Place 2 sprays into both nostrils daily.   . fluticasone (FLOVENT HFA) 44 MCG/ACT inhaler Inhale 1 puff into the lungs daily.  . montelukast (SINGULAIR) 10 MG tablet Take 10 mg by mouth at bedtime.   Marland Kitchen omeprazole (PRILOSEC) 40 MG capsule Take 40 mg by mouth daily.  . pramipexole (MIRAPEX) 0.5 MG tablet Take 1 tablet by mouth 5 (five) times daily.  . SUMAtriptan (IMITREX) 100 MG tablet TAKE 1 TABLET BY MOUTH AT ONSET OF HEADACHE. MAY TAKE AN ADDITIONAL TABLET IN 2 HOURS IF NEEDED. MAX OF 200mg /24hr  . terbinafine (LAMISIL) 250 MG tablet Take 2 tablets (500 mg total) by  mouth daily. Take for 1 week out of every 4 weeks  . Vitamin D, Ergocalciferol, (DRISDOL) 1.25 MG (50000 UT) CAPS capsule TAKE 1 CAPSULE BY MOUTH ONCE EVERY 7 DAYS  . zolpidem (AMBIEN) 5 MG tablet Take 1 tablet by mouth at bedtime.   No facility-administered medications prior to visit.    Review of Systems  Constitutional: Positive for activity change, appetite change and fatigue.  HENT: Positive for congestion, sinus  pressure, sinus pain and sore throat.   Respiratory: Positive for cough, shortness of breath and wheezing.   Neurological: Positive for headaches.       Objective    There were no vitals taken for this visit.   Awake, alert, oriented x 3. In no apparent distress    Assessment & Plan     1. Cough  - chlorpheniramine-HYDROcodone (TUSSIONEX PENNKINETIC ER) 10-8 MG/5ML SUER; Take 5 mLs by mouth at bedtime as needed for cough.  Dispense: 115 mL; Refill: 0  2. Bronchitis  - levofloxacin (LEVAQUIN) 750 MG tablet; Take 1 tablet (750 mg total) by mouth daily for 7 days.  Dispense: 7 tablet; Refill: 0     I discussed the assessment and treatment plan with the patient. The patient was provided an opportunity to ask questions and all were answered. The patient agreed with the plan and demonstrated an understanding of the instructions.   The patient was advised to call back or seek an in-person evaluation if the symptoms worsen or if the condition fails to improve as anticipated.  I provided 10 minutes of non-face-to-face time during this encounter.  The entirety of the information documented in the History of Present Illness, Review of Systems and Physical Exam were personally obtained by me. Portions of this information were initially documented by the CMA and reviewed by me for thoroughness and accuracy.     [name], MD [facility] [phone] (phone) [phone] (fax)  [facility]

## 2020-07-08 ENCOUNTER — Telehealth: Payer: Self-pay | Admitting: Family Medicine

## 2020-07-08 MED ORDER — AZITHROMYCIN 250 MG PO TABS: ORAL_TABLET | ORAL | 0 refills | Status: AC

## 2020-07-08 NOTE — Telephone Encounter (Signed)
Ok, I've sent prescription for azithromycin. She shouldn't take Levaquin more than 7 days at a time.

## 2020-07-08 NOTE — Telephone Encounter (Signed)
Patient advised and verbalized understanding 

## 2020-07-08 NOTE — Telephone Encounter (Signed)
Pt called stating that she was starting to feel better with the Levaquin medication that she was prescribed. She states that she took the last dose of this medication on Sunday. She states that since then she has begun to get worse again. She states that her cough is coming back, and a low grade temp. Please advise.      [facility] [place], [place] - [address]  [address] [place] [address]  Phone: [phone] Fax: [phone]  Hours: Not open 24 hours

## 2020-07-09 DIAGNOSIS — G2581 Restless legs syndrome: Secondary | ICD-10-CM | POA: Diagnosis not present

## 2020-07-09 DIAGNOSIS — R252 Cramp and spasm: Secondary | ICD-10-CM | POA: Diagnosis not present

## 2020-07-09 DIAGNOSIS — G43009 Migraine without aura, not intractable, without status migrainosus: Secondary | ICD-10-CM | POA: Diagnosis not present

## 2020-07-09 DIAGNOSIS — R200 Anesthesia of skin: Secondary | ICD-10-CM | POA: Diagnosis not present

## 2020-07-09 DIAGNOSIS — E538 Deficiency of other specified B group vitamins: Secondary | ICD-10-CM | POA: Diagnosis not present

## 2020-07-16 DIAGNOSIS — J3081 Allergic rhinitis due to animal (cat) (dog) hair and dander: Secondary | ICD-10-CM | POA: Diagnosis not present

## 2020-07-16 DIAGNOSIS — J301 Allergic rhinitis due to pollen: Secondary | ICD-10-CM | POA: Diagnosis not present

## 2020-07-16 DIAGNOSIS — J3089 Other allergic rhinitis: Secondary | ICD-10-CM | POA: Diagnosis not present

## 2020-07-17 ENCOUNTER — Inpatient Hospital Stay: Payer: Medicare Other | Attending: Oncology

## 2020-07-17 ENCOUNTER — Other Ambulatory Visit: Payer: Self-pay

## 2020-07-17 LAB — CBC WITH DIFFERENTIAL/PLATELET
Abs Immature Granulocytes: 0.01 10*3/uL (ref 0.00–0.07)
Basophils Absolute: 0.1 10*3/uL (ref 0.0–0.1)
Basophils Relative: 2 %
Eosinophils Absolute: 0.2 10*3/uL (ref 0.0–0.5)
Eosinophils Relative: 3 %
HCT: 40.8 % (ref 36.0–46.0)
Hemoglobin: 14.4 g/dL (ref 12.0–15.0)
Immature Granulocytes: 0 %
Lymphocytes Relative: 41 %
Lymphs Abs: 3 10*3/uL (ref 0.7–4.0)
MCH: 32.5 pg (ref 26.0–34.0)
MCHC: 35.3 g/dL (ref 30.0–36.0)
MCV: 92.1 fL (ref 80.0–100.0)
Monocytes Absolute: 0.7 10*3/uL (ref 0.1–1.0)
Monocytes Relative: 10 %
Neutro Abs: 3.3 10*3/uL (ref 1.7–7.7)
Neutrophils Relative %: 44 %
Platelets: 140 10*3/uL — ABNORMAL LOW (ref 150–400)
RBC: 4.43 MIL/uL (ref 3.87–5.11)
RDW: 13.7 % (ref 11.5–15.5)
WBC: 7.4 10*3/uL (ref 4.0–10.5)
nRBC: 0 % (ref 0.0–0.2)

## 2020-07-17 LAB — IRON AND TIBC
Iron: 97 ug/dL (ref 28–170)
Saturation Ratios: 26 % (ref 10.4–31.8)
TIBC: 367 ug/dL (ref 250–450)
UIBC: 270 ug/dL

## 2020-07-17 LAB — COMPREHENSIVE METABOLIC PANEL
ALT: 17 U/L (ref 0–44)
AST: 24 U/L (ref 15–41)
Albumin: 4.5 g/dL (ref 3.5–5.0)
Alkaline Phosphatase: 59 U/L (ref 38–126)
Anion gap: 6 (ref 5–15)
BUN: 20 mg/dL (ref 8–23)
CO2: 25 mmol/L (ref 22–32)
Calcium: 9.1 mg/dL (ref 8.9–10.3)
Chloride: 108 mmol/L (ref 98–111)
Creatinine, Ser: 0.56 mg/dL (ref 0.44–1.00)
GFR, Estimated: >60 mL/min (ref >60)
Glucose, Bld: 92 mg/dL (ref 70–99)
Potassium: 4.2 mmol/L (ref 3.5–5.1)
Sodium: 139 mmol/L (ref 135–145)
Total Bilirubin: 0.7 mg/dL (ref 0.3–1.2)
Total Protein: 7.4 g/dL (ref 6.5–8.1)

## 2020-07-17 LAB — FERRITIN: Ferritin: 17 ng/mL (ref 11–307)

## 2020-07-18 NOTE — Progress Notes (Addendum)
[facility]  Telephone:(336) [phone] Fax:(336) [phone]  ID: Heidi Hernandez OB: 1950-10-07  MR#: [id number]  RCV#:[id number]  Patient Care Team: Malva Limes, MD as PCP - General (Family Medicine) Eileen Stanford, MD as Referring Physician (Allergy and Immunology) Sallee Lange, MD as Consulting Physician (Ophthalmology) Mickle Mallory (Gastroenterology) Idelle Crouch, MD as Referring Physician (Neurology)  I connected with Heidi Hernandez on 07/22/20 at  1:30 PM EST by video enabled telemedicine visit and verified that I am speaking with the correct person using two identifiers.   I discussed the limitations, risks, security and privacy concerns of performing an evaluation and management service by telemedicine and the availability of in-person appointments. I also discussed with the patient that there may be a patient responsible charge related to this service. The patient expressed understanding and agreed to proceed.   Other persons participating in the visit and their role in the encounter: Patient, MD.  Patient's location: Home. Provider's location: Clinic.  CHIEF COMPLAINT: [name]  INTERVAL HISTORY: Patient agreed to video assisted telemedicine visit for further evaluation and discussion of her laboratory results.  She continues to have restless leg, but this has improved with treatment from neurology.  She otherwise feels well.  She has no other neurologic complaints.  She denies any recent fevers or illnesses.  She has a good appetite and denies weight loss.  She has no chest pain, shortness of breath, cough, or hemoptysis.  She denies any nausea, vomiting, constipation, or diarrhea.  She has no urinary complaints.  Patient offers no further specific complaints today.  REVIEW OF SYSTEMS:   Review of Systems  Constitutional: Negative.  Negative for fever, malaise/fatigue and weight loss.  Respiratory: Negative for cough,  hemoptysis and shortness of breath.   Cardiovascular: Negative.  Negative for chest pain and leg swelling.  Gastrointestinal: Negative.  Negative for abdominal pain.  Genitourinary: Negative.  Negative for dysuria.  Musculoskeletal: Negative.  Negative for back pain.  Skin: Negative.  Negative for rash.  Neurological: Negative.  Negative for dizziness, focal weakness, weakness and headaches.  Psychiatric/Behavioral: The patient has insomnia.     As per HPI. Otherwise, a complete review of systems is negative.  PAST MEDICAL HISTORY: Past Medical History:  Diagnosis Date  . Anxiety   . Asthma   . GERD (gastroesophageal reflux disease)   . Headache    Migraines  . Hypertension   . Idiopathic thrombocytopenia purpura (HCC)    Required splenectomy  . MVP (mitral valve prolapse)   . Neuromuscular disorder (HCC)    Restless Leg Syndrome  . Pancreatitis April 2017 [facility] due to cholecystitis.   . Pneumonia    06-2015    PAST SURGICAL HISTORY: Past Surgical History:  Procedure Laterality Date  . ABDOMINAL HYSTERECTOMY    . APPENDECTOMY    . CARPAL TUNNEL RELEASE Bilateral 11/19/2014   Procedure: CARPAL TUNNEL RELEASE;  Surgeon: Donato Heinz, MD;  Location: [facility] ORS;  Service: Orthopedics;  Laterality: Bilateral;  . CESAREAN SECTION     times 2  . CHOLECYSTECTOMY  09/29/2015   [facility]  . COLONOSCOPY N/A 05/02/2016   Procedure: COLONOSCOPY;  Surgeon: Scot Jun, MD;  Location[facility] ENDOSCOPY;  Service: Endoscopy;  Laterality: N/A;  . ESOPHAGOGASTRODUODENOSCOPY (EGD) WITH PROPOFOL N/A 05/02/2016   Procedure: ESOPHAGOGASTRODUODENOSCOPY (EGD) WITH PROPOFOL;  Surgeon: Scot Jun, MD;  Location: [facility] ENDOSCOPY;  Service: Endoscopy;  Laterality: N/A;  . ESOPHAGOGASTRODUODENOSCOPY (EGD) WITH PROPOFOL N/A 11/22/2017  Procedure: ESOPHAGOGASTRODUODENOSCOPY (EGD) WITH PROPOFOL;  Surgeon: Scot Jun, MD;  Location: [facility] ENDOSCOPY;  Service: Endoscopy;  Laterality: N/A;  .  HERNIA REPAIR     times 3  . JOINT REPLACEMENT    . OVARIAN CYST SURGERY    . SHOULDER ARTHROSCOPY WITH ROTATOR CUFF REPAIR     times 2  . SPLENECTOMY, TOTAL    . TOTAL SHOULDER ARTHROPLASTY Right 04/21/2017   Procedure: REVERSE SHOULDER ARTHROPLASTY;  Surgeon: Signa Kell, MD;  Location: [facility] ORS;  Service: Orthopedics;  Laterality: Right;  . TRIGGER FINGER RELEASE Right 11/19/2014   Procedure: RELEASE TRIGGER FINGER/A-1 PULLEY;  Surgeon: Donato Heinz, MD;  Location: [facility] ORS;  Service: Orthopedics;  Laterality: Right;  . TRIGGER FINGER RELEASE Right 08/12/2015   Procedure: RELEASE TRIGGER LONG FINGER;  Surgeon: Donato Heinz, MD;  Location: [facility] ORS;  Service: Orthopedics;  Laterality: Right;    FAMILY HISTORY: Family History  Problem Relation Age of Onset  . Hypertension Mother   . Renal Disease Father        ESRD with HD    ADVANCED DIRECTIVES (Y/N):  N  HEALTH MAINTENANCE: Social History   Tobacco Use  . Smoking status: Former Smoker    Packs/day: 1.00    Years: 10.00    Pack years: 10.00    Types: Cigarettes    Quit date: 11/10/1979    Years since quitting: 40.7  . Smokeless tobacco: Never Used  Vaping Use  . Vaping Use: Never used  Substance Use Topics  . Alcohol use: Not Currently    Alcohol/week: 0.0 standard drinks  . Drug use: No     Colonoscopy:  PAP:  Bone density:  Lipid panel:  Allergies  Allergen Reactions  . Augmentin [Amoxicillin-Pot Clavulanate] Nausea And Vomiting  . Droperidol Other (See Comments)    "Locked jaw" Facial muscles locked "Locked jaw"  . Paroxetine Hcl Other (See Comments)    syncope Passed out   . Morphine Itching    Current Outpatient Medications  Medication Sig Dispense Refill  . albuterol (VENTOLIN HFA) 108 (90 Base) MCG/ACT inhaler Inhale 2 puffs into the lungs every 6 (six) hours as needed for wheezing. May substitute generic or equivalent brand name 18 g 1  . albuterol (VENTOLIN HFA) 108 (90 Base) MCG/ACT  inhaler Inhale 2 puffs into the lungs every 6 (six) hours as needed for wheezing or shortness of breath. 8 g 2  . alendronate (FOSAMAX) 70 MG tablet TAKE ONE TABLET BY MOUTH EACH WEEK, ON AN EMPTY STOMACH BEFORE BREAKFAST WITH 8oz OF WATER AND REMAIN UPRIGHT FOR :30 12 tablet 4  . amLODipine (NORVASC) 5 MG tablet Take 1 tablet (5 mg total) by mouth daily. 30 tablet 5  . atenolol (TENORMIN) 50 MG tablet Take 0.5 tablets (25 mg total) by mouth daily. 90 tablet 0  . azelastine (ASTELIN) 0.1 % nasal spray Place 2 sprays into both nostrils daily.     . chlorpheniramine-HYDROcodone (TUSSIONEX PENNKINETIC ER) 10-8 MG/5ML SUER Take 5 mLs by mouth at bedtime as needed for cough. 115 mL 0  . clonazePAM (KLONOPIN) 0.5 MG tablet Take 0.5 mg by mouth at bedtime. 0.25 mg in the morning    . EPINEPHrine 0.3 mg/0.3 mL IJ SOAJ injection as needed.    Marland Kitchen Fexofenadine HCl (ALLEGRA PO) Take 1 capsule by mouth daily.    . fluticasone (FLONASE) 50 MCG/ACT nasal spray Place 2 sprays into both nostrils daily.     . fluticasone (FLOVENT HFA) 44  MCG/ACT inhaler Inhale 1 puff into the lungs daily.    . montelukast (SINGULAIR) 10 MG tablet Take 10 mg by mouth at bedtime.     Marland Kitchen omeprazole (PRILOSEC) 40 MG capsule Take 40 mg by mouth daily.    . pramipexole (MIRAPEX) 0.5 MG tablet Take 1 tablet by mouth 6 (six) times daily.    . SUMAtriptan (IMITREX) 100 MG tablet TAKE 1 TABLET BY MOUTH AT ONSET OF HEADACHE. MAY TAKE AN ADDITIONAL TABLET IN 2 HOURS IF NEEDED. MAX OF 200mg /24hr 9 tablet 2  . terbinafine (LAMISIL) 250 MG tablet Take 2 tablets (500 mg total) by mouth daily. Take for 1 week out of every 4 weeks 14 tablet 3  . Vitamin D, Ergocalciferol, (DRISDOL) 1.25 MG (50000 UT) CAPS capsule TAKE 1 CAPSULE BY MOUTH ONCE EVERY 7 DAYS 12 capsule 4  . zolpidem (AMBIEN) 5 MG tablet Take 1 tablet by mouth at bedtime.     No current facility-administered medications for this visit.    OBJECTIVE: There were no vitals filed for this  visit.   There is no height or weight on file to calculate BMI.    ECOG FS:0 - Asymptomatic  General: Well-developed, well-nourished, no acute distress. HEENT: Normocephalic. Neuro: Alert, answering all questions appropriately. Cranial nerves grossly intact. Psych: Normal affect.  LAB RESULTS:  Lab Results  Component Value Date   NA 139 07/17/2020   K 4.2 07/17/2020   CL 108 07/17/2020   CO2 25 07/17/2020   GLUCOSE 92 07/17/2020   BUN 20 07/17/2020   CREATININE 0.56 07/17/2020   CALCIUM 9.1 07/17/2020   PROT 7.4 07/17/2020   ALBUMIN 4.5 07/17/2020   AST 24 07/17/2020   ALT 17 07/17/2020   ALKPHOS 59 07/17/2020   BILITOT 0.7 07/17/2020   GFRNONAA >60 07/17/2020   GFRAA 99 06/04/2020    Lab Results  Component Value Date   WBC 7.4 07/17/2020   NEUTROABS 3.3 07/17/2020   HGB 14.4 07/17/2020   HCT 40.8 07/17/2020   MCV 92.1 07/17/2020   PLT 140 (L) 07/17/2020   Lab Results  Component Value Date   IRON 97 07/17/2020   TIBC 367 07/17/2020   IRONPCTSAT 26 07/17/2020   Lab Results  Component Value Date   FERRITIN 17 07/17/2020     STUDIES: No results found.  ASSESSMENT: [name].  PLAN:    1. [name]: Patient is homozygous.  Patient's ferritin continues to be within normal limits.  Her total iron and iron saturation have trended down, therefore she does not require additional phlebotomy today.  No intervention is needed at this time.  Return to clinic in 3 months with repeat laboratory work, further evaluation, and video assisted telemedicine visit.     2.  Restless leg syndrome: Improved.  Unrelated to hemochromatosis.  Continue follow-up and treatment per neurology.   3.  Insomnia: Secondary to restless leg.  I provided 20 minutes of face-to-face video visit time during this encounter which included chart review, counseling, and coordination of care as documented above.   Patient expressed understanding and was in agreement  with this plan. She also understands that She can call clinic at any time with any questions, concerns, or complaints.    07/19/2020, MD   07/22/2020 1:51 PM

## 2020-07-21 ENCOUNTER — Inpatient Hospital Stay (HOSPITAL_BASED_OUTPATIENT_CLINIC_OR_DEPARTMENT_OTHER): Payer: Medicare Other | Admitting: Oncology

## 2020-07-21 ENCOUNTER — Inpatient Hospital Stay: Payer: Medicare Other

## 2020-07-21 ENCOUNTER — Encounter: Payer: Self-pay | Admitting: Oncology

## 2020-07-21 NOTE — Progress Notes (Signed)
Patient denies any concerns today.  

## 2020-08-04 ENCOUNTER — Other Ambulatory Visit: Payer: Self-pay | Admitting: Family Medicine

## 2020-08-04 DIAGNOSIS — I10 Essential (primary) hypertension: Secondary | ICD-10-CM

## 2020-08-07 DIAGNOSIS — J301 Allergic rhinitis due to pollen: Secondary | ICD-10-CM | POA: Diagnosis not present

## 2020-08-07 DIAGNOSIS — J3081 Allergic rhinitis due to animal (cat) (dog) hair and dander: Secondary | ICD-10-CM | POA: Diagnosis not present

## 2020-08-07 DIAGNOSIS — J3089 Other allergic rhinitis: Secondary | ICD-10-CM | POA: Diagnosis not present

## 2020-08-18 DIAGNOSIS — J3089 Other allergic rhinitis: Secondary | ICD-10-CM | POA: Diagnosis not present

## 2020-08-18 DIAGNOSIS — J301 Allergic rhinitis due to pollen: Secondary | ICD-10-CM | POA: Diagnosis not present

## 2020-08-18 DIAGNOSIS — J3081 Allergic rhinitis due to animal (cat) (dog) hair and dander: Secondary | ICD-10-CM | POA: Diagnosis not present

## 2020-08-21 ENCOUNTER — Other Ambulatory Visit: Payer: Self-pay | Admitting: Family Medicine

## 2020-08-21 NOTE — Telephone Encounter (Signed)
Requested medication (s) are due for refill today: no  Requested medication (s) are on the active medication list: yes  Last refill: 09/16/2019  Future visit scheduled: no  Notes to clinic:  this refill cannot be delegated    Requested Prescriptions  Pending Prescriptions Disp Refills   Vitamin D, Ergocalciferol, (DRISDOL) 1.25 MG (50000 UNIT) CAPS capsule [Pharmacy Med Name: VITAMIN D (ERGOCALCIFEROL) 1.25 MG] 12 capsule 4    Sig: TAKE 1 CAPSULE BY MOUTH ONCE A WEEK      Endocrinology:  Vitamins - Vitamin D Supplementation Failed - 08/21/2020 11:15 AM      Failed - 50,000 IU strengths are not delegated      Failed - Phosphate in normal range and within 360 days    Phosphorus  Date Value Ref Range Status  01/15/2018 3.6 2.5 - 4.5 mg/dL Final          Failed - Vitamin D in normal range and within 360 days    Vit D, 25-Hydroxy  Date Value Ref Range Status  06/04/2020 29.5 (L) 30.0 - 100.0 ng/mL Final    Comment:    Vitamin D deficiency has been defined by the Institute of Medicine and an Endocrine Society practice guideline as a level of serum 25-OH vitamin D less than 20 ng/mL (1,2). The Endocrine Society went on to further define vitamin D insufficiency as a level between 21 and 29 ng/mL (2). 1. IOM (Institute of Medicine). 2010. Dietary reference    intakes for calcium and D. [place]: The    [employer]. 2. Holick MF, Binkley Cranston, Bischoff-Ferrari HA, et al.    Evaluation, treatment, and prevention of vitamin D    deficiency: an Endocrine Society clinical practice    guideline. JCEM. 2011 Jul; 96(7):1911-30.           Passed - Ca in normal range and within 360 days    Calcium  Date Value Ref Range Status  07/17/2020 9.1 8.9 - 10.3 mg/dL Final   Calcium, Ion  Date Value Ref Range Status  06/27/2010 1.16 1.12 - 1.32 mmol/L Final          Passed - Valid encounter within last 12 months    Recent Outpatient Visits           1 month ago Cough    [facility] Malva Limes, MD   2 months ago Need for influenza vaccination   [facility] Malva Limes, MD   4 months ago Bronchitis   [facility] [place], [place], [place]   1 year ago Restless legs syndrome   [facility] Malva Limes, MD   1 year ago Cough   [facility] Flinchum, Eula Fried, FNP       Future Appointments             In 2 months Orlie Dakin, Tollie Pizza, MD [facility] Medical Oncology

## 2020-08-24 DIAGNOSIS — K30 Functional dyspepsia: Secondary | ICD-10-CM | POA: Diagnosis not present

## 2020-08-24 DIAGNOSIS — R140 Abdominal distension (gaseous): Secondary | ICD-10-CM | POA: Diagnosis not present

## 2020-08-24 DIAGNOSIS — Z112 Encounter for screening for other bacterial diseases: Secondary | ICD-10-CM | POA: Diagnosis not present

## 2020-08-26 DIAGNOSIS — E538 Deficiency of other specified B group vitamins: Secondary | ICD-10-CM | POA: Diagnosis not present

## 2020-08-26 DIAGNOSIS — G2581 Restless legs syndrome: Secondary | ICD-10-CM | POA: Diagnosis not present

## 2020-08-26 DIAGNOSIS — R200 Anesthesia of skin: Secondary | ICD-10-CM | POA: Diagnosis not present

## 2020-08-26 DIAGNOSIS — R252 Cramp and spasm: Secondary | ICD-10-CM | POA: Diagnosis not present

## 2020-09-01 DIAGNOSIS — J3089 Other allergic rhinitis: Secondary | ICD-10-CM | POA: Diagnosis not present

## 2020-09-01 DIAGNOSIS — J301 Allergic rhinitis due to pollen: Secondary | ICD-10-CM | POA: Diagnosis not present

## 2020-09-01 DIAGNOSIS — J3081 Allergic rhinitis due to animal (cat) (dog) hair and dander: Secondary | ICD-10-CM | POA: Diagnosis not present

## 2020-09-08 DIAGNOSIS — J301 Allergic rhinitis due to pollen: Secondary | ICD-10-CM | POA: Diagnosis not present

## 2020-09-08 DIAGNOSIS — J3089 Other allergic rhinitis: Secondary | ICD-10-CM | POA: Diagnosis not present

## 2020-09-08 DIAGNOSIS — J3081 Allergic rhinitis due to animal (cat) (dog) hair and dander: Secondary | ICD-10-CM | POA: Diagnosis not present

## 2020-09-10 DIAGNOSIS — J3081 Allergic rhinitis due to animal (cat) (dog) hair and dander: Secondary | ICD-10-CM | POA: Diagnosis not present

## 2020-09-10 DIAGNOSIS — J301 Allergic rhinitis due to pollen: Secondary | ICD-10-CM | POA: Diagnosis not present

## 2020-09-22 DIAGNOSIS — J3081 Allergic rhinitis due to animal (cat) (dog) hair and dander: Secondary | ICD-10-CM | POA: Diagnosis not present

## 2020-09-22 DIAGNOSIS — J301 Allergic rhinitis due to pollen: Secondary | ICD-10-CM | POA: Diagnosis not present

## 2020-09-22 DIAGNOSIS — J3089 Other allergic rhinitis: Secondary | ICD-10-CM | POA: Diagnosis not present

## 2020-10-13 DIAGNOSIS — J301 Allergic rhinitis due to pollen: Secondary | ICD-10-CM | POA: Diagnosis not present

## 2020-10-13 DIAGNOSIS — J3081 Allergic rhinitis due to animal (cat) (dog) hair and dander: Secondary | ICD-10-CM | POA: Diagnosis not present

## 2020-10-13 DIAGNOSIS — J3089 Other allergic rhinitis: Secondary | ICD-10-CM | POA: Diagnosis not present

## 2020-10-15 DIAGNOSIS — J3081 Allergic rhinitis due to animal (cat) (dog) hair and dander: Secondary | ICD-10-CM | POA: Diagnosis not present

## 2020-10-15 DIAGNOSIS — J3089 Other allergic rhinitis: Secondary | ICD-10-CM | POA: Diagnosis not present

## 2020-10-15 DIAGNOSIS — J301 Allergic rhinitis due to pollen: Secondary | ICD-10-CM | POA: Diagnosis not present

## 2020-10-20 DIAGNOSIS — J301 Allergic rhinitis due to pollen: Secondary | ICD-10-CM | POA: Diagnosis not present

## 2020-10-20 DIAGNOSIS — J3081 Allergic rhinitis due to animal (cat) (dog) hair and dander: Secondary | ICD-10-CM | POA: Diagnosis not present

## 2020-10-20 DIAGNOSIS — J3089 Other allergic rhinitis: Secondary | ICD-10-CM | POA: Diagnosis not present

## 2020-10-24 NOTE — Progress Notes (Signed)
[facility]  Telephone:(336) [phone] Fax:(336) [phone]  ID: Heidi Hernandez OB: [birth year]  MR#: [id number]  HCW#:[id number]  Patient Care Team: Malva Limes, MD as PCP - General (Family Medicine) Eileen Stanford, MD as Referring Physician (Allergy and Immunology) Sallee Lange, MD as Consulting Physician (Ophthalmology) Mickle Mallory (Gastroenterology) Idelle Crouch, MD as Referring Physician (Neurology)   I connected with Heidi Hernandez on 10/29/20 at  2:45 PM EDT by video enabled telemedicine visit and verified that I am speaking with the correct person using two identifiers.   I discussed the limitations, risks, security and privacy concerns of performing an evaluation and management service by telemedicine and the availability of in-person appointments. I also discussed with the patient that there may be a patient responsible charge related to this service. The patient expressed understanding and agreed to proceed.   Other persons participating in the visit and their role in the encounter: Patient, MD.  Patient's location: Home. Provider's location: Clinic.  CHIEF COMPLAINT: [name]  INTERVAL HISTORY: Patient agreed to video assisted telemedicine visit for further evaluation and discussion of her laboratory results.  She continues to have problems with restless legs, but otherwise feels well and is asymptomatic.  She has no other neurologic complaints. She denies any recent fevers or illnesses.  She has a good appetite and denies weight loss.  She has no chest pain, shortness of breath, cough, or hemoptysis.  She denies any nausea, vomiting, constipation, or diarrhea.  She has no urinary complaints.  Patient offers no further specific complaints today.  REVIEW OF SYSTEMS:   Review of Systems  Constitutional: Negative.  Negative for fever, malaise/fatigue and weight loss.  Respiratory: Negative for cough, hemoptysis and  shortness of breath.   Cardiovascular: Negative.  Negative for chest pain and leg swelling.  Gastrointestinal: Negative.  Negative for abdominal pain.  Genitourinary: Negative.  Negative for dysuria.  Musculoskeletal: Negative.  Negative for back pain.  Skin: Negative.  Negative for rash.  Neurological: Negative.  Negative for dizziness, focal weakness, weakness and headaches.  Psychiatric/Behavioral: The patient has insomnia.     As per HPI. Otherwise, a complete review of systems is negative.  PAST MEDICAL HISTORY: Past Medical History:  Diagnosis Date  . Anxiety   . Asthma   . GERD (gastroesophageal reflux disease)   . Headache    Migraines  . Hypertension   . Idiopathic thrombocytopenia purpura (HCC)    Required splenectomy  . MVP (mitral valve prolapse)   . Neuromuscular disorder (HCC)    Restless Leg Syndrome  . Pancreatitis April 2017 [facility] due to cholecystitis.   . Pneumonia    06-2015    PAST SURGICAL HISTORY: Past Surgical History:  Procedure Laterality Date  . ABDOMINAL HYSTERECTOMY    . APPENDECTOMY    . CARPAL TUNNEL RELEASE Bilateral 11/19/2014   Procedure: CARPAL TUNNEL RELEASE;  Surgeon: Donato Heinz, MD;  Location: [facility] ORS;  Service: Orthopedics;  Laterality: Bilateral;  . CESAREAN SECTION     times 2  . CHOLECYSTECTOMY  09/29/2015   [facility]  . COLONOSCOPY N/A 05/02/2016   Procedure: COLONOSCOPY;  Surgeon: Scot Jun, MD;  Location: [facility] ENDOSCOPY;  Service: Endoscopy;  Laterality: N/A;  . ESOPHAGOGASTRODUODENOSCOPY (EGD) WITH PROPOFOL N/A 05/02/2016   Procedure: ESOPHAGOGASTRODUODENOSCOPY (EGD) WITH PROPOFOL;  Surgeon: Scot Jun, MD;  Location: [facility] ENDOSCOPY;  Service: Endoscopy;  Laterality: N/A;  . ESOPHAGOGASTRODUODENOSCOPY (EGD) WITH PROPOFOL N/A 11/22/2017   Procedure: ESOPHAGOGASTRODUODENOSCOPY (  EGD) WITH PROPOFOL;  Surgeon: Scot Jun, MD;  Location: [facility] ENDOSCOPY;  Service: Endoscopy;  Laterality: N/A;  . HERNIA REPAIR      times 3  . JOINT REPLACEMENT    . OVARIAN CYST SURGERY    . SHOULDER ARTHROSCOPY WITH ROTATOR CUFF REPAIR     times 2  . SPLENECTOMY, TOTAL    . TOTAL SHOULDER ARTHROPLASTY Right 04/21/2017   Procedure: REVERSE SHOULDER ARTHROPLASTY;  Surgeon: Signa Kell, MD;  Location: [facility] ORS;  Service: Orthopedics;  Laterality: Right;  . TRIGGER FINGER RELEASE Right 11/19/2014   Procedure: RELEASE TRIGGER FINGER/A-1 PULLEY;  Surgeon: Donato Heinz, MD;  Location: [facility] ORS;  Service: Orthopedics;  Laterality: Right;  . TRIGGER FINGER RELEASE Right 08/12/2015   Procedure: RELEASE TRIGGER LONG FINGER;  Surgeon: Donato Heinz, MD;  Location: [facility] ORS;  Service: Orthopedics;  Laterality: Right;    FAMILY HISTORY: Family History  Problem Relation Age of Onset  . Hypertension Mother   . Renal Disease Father        ESRD with HD    ADVANCED DIRECTIVES (Y/N):  N  HEALTH MAINTENANCE: Social History   Tobacco Use  . Smoking status: Former Smoker    Packs/day: 1.00    Years: 10.00    Pack years: 10.00    Types: Cigarettes    Quit date: 11/10/1979    Years since quitting: 40.9  . Smokeless tobacco: Never Used  Vaping Use  . Vaping Use: Never used  Substance Use Topics  . Alcohol use: Not Currently    Alcohol/week: 0.0 standard drinks  . Drug use: No     Colonoscopy:  PAP:  Bone density:  Lipid panel:  Allergies  Allergen Reactions  . Augmentin [Amoxicillin-Pot Clavulanate] Nausea And Vomiting  . Droperidol Other (See Comments)    "Locked jaw" Facial muscles locked "Locked jaw"  . Paroxetine Hcl Other (See Comments)    syncope Passed out   . Morphine Itching    Current Outpatient Medications  Medication Sig Dispense Refill  . albuterol (VENTOLIN HFA) 108 (90 Base) MCG/ACT inhaler Inhale 2 puffs into the lungs every 6 (six) hours as needed for wheezing. May substitute generic or equivalent brand name 18 g 1  . albuterol (VENTOLIN HFA) 108 (90 Base) MCG/ACT inhaler Inhale 2  puffs into the lungs every 6 (six) hours as needed for wheezing or shortness of breath. 8 g 2  . alendronate (FOSAMAX) 70 MG tablet TAKE ONE TABLET BY MOUTH EACH WEEK, ON AN EMPTY STOMACH BEFORE BREAKFAST WITH 8oz OF WATER AND REMAIN UPRIGHT FOR :30 12 tablet 4  . amLODipine (NORVASC) 5 MG tablet Take 1 tablet (5 mg total) by mouth daily. 30 tablet 5  . atenolol (TENORMIN) 50 MG tablet TAKE 1 TABLET BY MOUTH ONCE DAILY 90 tablet 1  . azelastine (ASTELIN) 0.1 % nasal spray Place 2 sprays into both nostrils daily.     . carbidopa-levodopa (SINEMET IR) 25-100 MG tablet Take 1 tablet by mouth in the morning and at bedtime.    . chlorpheniramine-HYDROcodone (TUSSIONEX PENNKINETIC ER) 10-8 MG/5ML SUER Take 5 mLs by mouth at bedtime as needed for cough. 115 mL 0  . clonazePAM (KLONOPIN) 0.5 MG tablet Take 0.5 mg by mouth at bedtime. 0.25 mg in the morning    . EPINEPHrine 0.3 mg/0.3 mL IJ SOAJ injection as needed.    Marland Kitchen Fexofenadine HCl (ALLEGRA PO) Take 1 capsule by mouth daily.    . fluticasone (FLONASE) 50 MCG/ACT nasal  spray Place 2 sprays into both nostrils daily.     . fluticasone (FLOVENT HFA) 44 MCG/ACT inhaler Inhale 1 puff into the lungs daily.    . montelukast (SINGULAIR) 10 MG tablet Take 10 mg by mouth at bedtime.     Marland Kitchen omeprazole (PRILOSEC) 40 MG capsule Take 40 mg by mouth daily.    . pramipexole (MIRAPEX) 0.5 MG tablet Take 1 tablet by mouth 6 (six) times daily.    . SUMAtriptan (IMITREX) 100 MG tablet TAKE 1 TABLET BY MOUTH AT ONSET OF HEADACHE. MAY TAKE AN ADDITIONAL TABLET IN 2 HOURS IF NEEDED. MAX OF 200mg /24hr 9 tablet 2  . terbinafine (LAMISIL) 250 MG tablet Take 2 tablets (500 mg total) by mouth daily. Take for 1 week out of every 4 weeks 14 tablet 3  . Vitamin D, Ergocalciferol, (DRISDOL) 1.25 MG (50000 UNIT) CAPS capsule TAKE 1 CAPSULE BY MOUTH ONCE A WEEK 12 capsule 4  . zolpidem (AMBIEN) 5 MG tablet Take 1 tablet by mouth at bedtime.     No current facility-administered  medications for this visit.    OBJECTIVE: There were no vitals filed for this visit.   There is no height or weight on file to calculate BMI.    ECOG FS:0 - Asymptomatic  General: Well-developed, well-nourished, no acute distress. HEENT: Normocephalic. Neuro: Alert, answering all questions appropriately. Cranial nerves grossly intact. Psych: Normal affect.  LAB RESULTS:  Lab Results  Component Value Date   NA 142 10/26/2020   K 4.0 10/26/2020   CL 108 10/26/2020   CO2 22 10/26/2020   GLUCOSE 99 10/26/2020   BUN 14 10/26/2020   CREATININE 0.68 10/26/2020   CALCIUM 9.0 10/26/2020   PROT 7.6 10/26/2020   ALBUMIN 4.4 10/26/2020   AST 22 10/26/2020   ALT 16 10/26/2020   ALKPHOS 66 10/26/2020   BILITOT 0.9 10/26/2020   GFRNONAA >60 10/26/2020   GFRAA 99 06/04/2020    Lab Results  Component Value Date   WBC 8.9 10/26/2020   NEUTROABS 4.4 10/26/2020   HGB 14.7 10/26/2020   HCT 42.7 10/26/2020   MCV 91.4 10/26/2020   PLT 156 10/26/2020   Lab Results  Component Value Date   IRON 144 10/26/2020   TIBC 340 10/26/2020   IRONPCTSAT 42 (H) 10/26/2020   Lab Results  Component Value Date   FERRITIN 22 10/26/2020     STUDIES: No results found.  ASSESSMENT: [name].  PLAN:    1. [name]: Patient is homozygous.  Patient's ferritin level continues to be within normal limits at 22, but her saturation ratio has trended up to 42%.  It appears patient only requires phlebotomy every 6 months.  Return to clinic later this week for phlebotomy only and then in 6 months for laboratory work, video assisted telemedicine visit, and consideration of phlebotomy if needed.   2.  Restless leg syndrome: Chronic and unchanged.  Unrelated to hemochromatosis.  Continue follow-up and treatment per neurology.   3.  Insomnia: Secondary to restless leg.  I provided 20 minutes of face-to-face video visit time during this encounter which included chart review,  counseling, and coordination of care as documented above.   Patient expressed understanding and was in agreement with this plan. She also understands that She can call clinic at any time with any questions, concerns, or complaints.    12/26/2020, MD   10/29/2020 9:32 AM

## 2020-10-26 ENCOUNTER — Inpatient Hospital Stay: Payer: Medicare Other | Attending: Oncology

## 2020-10-26 LAB — CBC WITH DIFFERENTIAL/PLATELET
Abs Immature Granulocytes: 0.01 10*3/uL (ref 0.00–0.07)
Basophils Absolute: 0.1 10*3/uL (ref 0.0–0.1)
Basophils Relative: 1 %
Eosinophils Absolute: 0.2 10*3/uL (ref 0.0–0.5)
Eosinophils Relative: 2 %
HCT: 42.7 % (ref 36.0–46.0)
Hemoglobin: 14.7 g/dL (ref 12.0–15.0)
Immature Granulocytes: 0 %
Lymphocytes Relative: 38 %
Lymphs Abs: 3.4 10*3/uL (ref 0.7–4.0)
MCH: 31.5 pg (ref 26.0–34.0)
MCHC: 34.4 g/dL (ref 30.0–36.0)
MCV: 91.4 fL (ref 80.0–100.0)
Monocytes Absolute: 0.9 10*3/uL (ref 0.1–1.0)
Monocytes Relative: 10 %
Neutro Abs: 4.4 10*3/uL (ref 1.7–7.7)
Neutrophils Relative %: 49 %
Platelets: 156 10*3/uL (ref 150–400)
RBC: 4.67 MIL/uL (ref 3.87–5.11)
RDW: 15.4 % (ref 11.5–15.5)
Smear Review: ADEQUATE
WBC: 8.9 10*3/uL (ref 4.0–10.5)
nRBC: 0 % (ref 0.0–0.2)

## 2020-10-26 LAB — FERRITIN: Ferritin: 22 ng/mL (ref 11–307)

## 2020-10-26 LAB — COMPREHENSIVE METABOLIC PANEL
ALT: 16 U/L (ref 0–44)
AST: 22 U/L (ref 15–41)
Albumin: 4.4 g/dL (ref 3.5–5.0)
Alkaline Phosphatase: 66 U/L (ref 38–126)
Anion gap: 12 (ref 5–15)
BUN: 14 mg/dL (ref 8–23)
CO2: 22 mmol/L (ref 22–32)
Calcium: 9 mg/dL (ref 8.9–10.3)
Chloride: 108 mmol/L (ref 98–111)
Creatinine, Ser: 0.68 mg/dL (ref 0.44–1.00)
GFR, Estimated: >60 mL/min (ref >60)
Glucose, Bld: 99 mg/dL (ref 70–99)
Potassium: 4 mmol/L (ref 3.5–5.1)
Sodium: 142 mmol/L (ref 135–145)
Total Bilirubin: 0.9 mg/dL (ref 0.3–1.2)
Total Protein: 7.6 g/dL (ref 6.5–8.1)

## 2020-10-26 LAB — IRON AND TIBC
Iron: 144 ug/dL (ref 28–170)
Saturation Ratios: 42 % — ABNORMAL HIGH (ref 10.4–31.8)
TIBC: 340 ug/dL (ref 250–450)
UIBC: 196 ug/dL

## 2020-10-27 ENCOUNTER — Inpatient Hospital Stay (HOSPITAL_BASED_OUTPATIENT_CLINIC_OR_DEPARTMENT_OTHER): Payer: Medicare Other | Admitting: Oncology

## 2020-10-27 ENCOUNTER — Encounter: Payer: Self-pay | Admitting: Oncology

## 2020-10-27 NOTE — Progress Notes (Signed)
Patient denies any concerns today.  

## 2020-10-29 ENCOUNTER — Inpatient Hospital Stay: Payer: Medicare Other

## 2020-10-29 NOTE — Patient Instructions (Signed)
CANCER CENTER [facility] REGIONAL MEDICAL ONCOLOGY  Discharge Instructions: Thank you for choosing [facility] to provide your oncology and hematology care.  If you have a lab appointment with the Cancer Center, please go directly to the Cancer Center and check in at the registration area.  Wear comfortable clothing and clothing appropriate for easy access to any Portacath or PICC line.   We strive to give you quality time with your provider. You may need to reschedule your appointment if you arrive late (15 or more minutes).  Arriving late affects you and other patients whose appointments are after yours.  Also, if you miss three or more appointments without notifying the office, you may be dismissed from the clinic at the provider's discretion.      For prescription refill requests, have your pharmacy contact our office and allow 72 hours for refills to be completed.       To help prevent nausea and vomiting after your treatment, we encourage you to take your nausea medication as directed.  BELOW ARE SYMPTOMS THAT SHOULD BE REPORTED IMMEDIATELY: . *FEVER GREATER THAN 100.4 F (38 C) OR HIGHER . *CHILLS OR SWEATING . *NAUSEA AND VOMITING THAT IS NOT CONTROLLED WITH YOUR NAUSEA MEDICATION . *UNUSUAL SHORTNESS OF BREATH . *UNUSUAL BRUISING OR BLEEDING . *URINARY PROBLEMS (pain or burning when urinating, or frequent urination) . *BOWEL PROBLEMS (unusual diarrhea, constipation, pain near the anus) . TENDERNESS IN MOUTH AND THROAT WITH OR WITHOUT PRESENCE OF ULCERS (sore throat, sores in mouth, or a toothache) . UNUSUAL RASH, SWELLING OR PAIN  . UNUSUAL VAGINAL DISCHARGE OR ITCHING   Items with * indicate a potential emergency and should be followed up as soon as possible or go to the Emergency Department if any problems should occur.  Please show the CHEMOTHERAPY ALERT CARD or IMMUNOTHERAPY ALERT CARD at check-in to the Emergency Department and triage nurse.  Should you have  questions after your visit or need to cancel or reschedule your appointment, please contact CANCER CENTER [facility] REGIONAL MEDICAL ONCOLOGY  [phone] and follow the prompts.  Office hours are 8:00 a.m. to 4:30 p.m. Monday - Friday. Please note that voicemails left after 4:00 p.m. may not be returned until the following business day.  We are closed weekends and major holidays. You have access to a nurse at all times for urgent questions. Please call the main number to the clinic [phone] and follow the prompts.  For any non-urgent questions, you may also contact your provider using MyChart. We now offer e-Visits for anyone 13 and older to request care online for non-urgent symptoms. For details visit [email or web address].   Also download the MyChart app! Go to the app store, search "MyChart", open the app, select [facility], and log in with your MyChart username and password.  Due to Covid, a mask is required upon entering the hospital/clinic. If you do not have a mask, one will be given to you upon arrival. For doctor visits, patients may have 1 support person aged 66 or older with them. For treatment visits, patients cannot have anyone with them due to current Covid guidelines and our immunocompromised population.      Therapeutic Phlebotomy Discharge Instructions  - Increase your fluid intake over the next 4 hours  - No smoking for 30 minutes  - Avoid using the affected arm (the one you had the blood drawn from) for heavy lifting or other activities.  - You may resume all normal activities after 30 minutes.  You are to notify the office if you experience:   - Persistent dizziness and/or lightheadedness -Uncontrolled or excessive bleeding at the site.   Therapeutic Phlebotomy Therapeutic phlebotomy is the planned removal of blood from a person's body for the purpose of treating a medical condition. The procedure is similar to donating blood. Usually, about a pint (470 mL, or 0.47  L) of blood is removed. The average adult has 9-12 pints (4.3-5.7 L) of blood in the body. Therapeutic phlebotomy may be used to treat the following medical conditions:  Hemochromatosis. This is a condition in which the blood contains too much iron.  Polycythemia vera. This is a condition in which the blood contains too many red blood cells.  Porphyria cutanea tarda. This is a disease in which an important part of hemoglobin is not made properly. It results in the buildup of abnormal amounts of porphyrins in the body.  Sickle cell disease. This is a condition in which the red blood cells form an abnormal crescent shape rather than a round shape. Tell a health care provider about:  Any allergies you have.  All medicines you are taking, including vitamins, herbs, eye drops, creams, and over-the-counter medicines.  Any problems you or family members have had with anesthetic medicines.  Any blood disorders you have.  Any surgeries you have had.  Any medical conditions you have.  Whether you are pregnant or may be pregnant. What are the risks? Generally, this is a safe procedure. However, problems may occur, including:  Nausea or light-headedness.  Low blood pressure (hypotension).  Soreness, bleeding, swelling, or bruising at the needle insertion site.  Infection. What happens before the procedure?  Follow instructions from your health care provider about eating or drinking restrictions.  Ask your health care provider about: ? Changing or stopping your regular medicines. This is especially important if you are taking diabetes medicines or blood thinners (anticoagulants). ? Taking medicines such as aspirin and ibuprofen. These medicines can thin your blood. Do not take these medicines unless your health care provider tells you to take them. ? Taking over-the-counter medicines, vitamins, herbs, and supplements.  Wear clothing with sleeves that can be raised above the  elbow.  Plan to have someone take you home from the hospital or clinic.  You may have a blood sample taken.  Your blood pressure, pulse rate, and breathing rate will be measured. What happens during the procedure?  To lower your risk of infection: ? Your health care team will wash or sanitize their hands. ? Your skin will be cleaned with an antiseptic.  You may be given a medicine to numb the area (local anesthetic).  A tourniquet will be placed on your arm.  A needle will be inserted into one of your veins.  Tubing and a collection bag will be attached to that needle.  Blood will flow through the needle and tubing into the collection bag.  The collection bag will be placed lower than your arm to allow gravity to help the flow of blood into the bag.  You may be asked to open and close your hand slowly and continually during the entire collection.  After the specified amount of blood has been removed from your body, the collection bag and tubing will be clamped.  The needle will be removed from your vein.  Pressure will be held on the site of the needle insertion to stop the bleeding.  A bandage (dressing) will be placed over the needle insertion site. The  procedure may vary among health care providers and hospitals.   What happens after the procedure?  Your blood pressure, pulse rate, and breathing rate will be measured after the procedure.  You will be encouraged to drink fluids.  Your recovery will be assessed and monitored.  You can return to your normal activities as told by your health care provider. Summary  Therapeutic phlebotomy is the planned removal of blood from a person's body for the purpose of treating a medical condition.  Therapeutic phlebotomy may be used to treat hemochromatosis, polycythemia vera, porphyria cutanea tarda, or sickle cell disease.  In the procedure, a needle is inserted and about a pint (470 mL, or 0.47 L) of blood is removed. The  average adult has 9-12 pints (4.3-5.7 L) of blood in the body.  This is generally a safe procedure, but it can sometimes cause problems such as nausea, light-headedness, or low blood pressure (hypotension). This information is not intended to replace advice given to you by your health care provider. Make sure you discuss any questions you have with your health care provider. Document Revised: 06/29/2017 Document Reviewed: 06/29/2017 Elsevier Patient Education  2021 [employer].

## 2020-11-02 ENCOUNTER — Ambulatory Visit: Payer: Self-pay | Admitting: *Deleted

## 2020-11-02 ENCOUNTER — Telehealth: Payer: Self-pay | Admitting: Family Medicine

## 2020-11-02 MED ORDER — ONDANSETRON 4 MG PO TBDP: 4.0000 mg | ORAL_TABLET | Freq: Three times a day (TID) | ORAL | 0 refills | Status: DC | PRN

## 2020-11-02 NOTE — Telephone Encounter (Signed)
Pt advised.   Thanks,   -Joani Cosma  

## 2020-11-02 NOTE — Addendum Note (Signed)
Addended by: Malva Limes on: 11/02/2020 02:51 PM   Modules accepted: Orders

## 2020-11-02 NOTE — Telephone Encounter (Signed)
Pt called and stated she has started vomiting and has diarrhea/ this started about 11pm last night/ she stated she is no longer vomiting /pt asked if Zolfran can be called in for her to help with the nausea/ please advise or work in for an appt / Triage nurse has been alerted and will speak with pt  [facility] - [place], [place] - [address] Phone:  [phone]  Fax:  [phone]

## 2020-11-02 NOTE — Telephone Encounter (Signed)
Prescription zofran sent to pharmacy.

## 2020-11-02 NOTE — Telephone Encounter (Signed)
Patient is calling office to request medication- Zofran for nausea. Patient reports nausea and vomiting and diarrhea. Started after 11:30 last night.Patient reports strong odor with diarrhea and vomiting. Patient is doing ginger ale sips this morning and resting. Reports low grade temp. Patient states her family has been passing a intestinal virus back and forth. Patient is requesting anti nausea medication for her symptoms. Advised patient I would send her request for review.  Reason for Disposition . [1] SEVERE diarrhea (e.g., 7 or more times / day more than normal) AND [2] present > 24 hours (1 day)  Answer Assessment - Initial Assessment Questions 1. DIARRHEA SEVERITY: "How bad is the diarrhea?" "How many more stools have you had in the past 24 hours than normal?"    - NO DIARRHEA (SCALE 0)   - MILD (SCALE 1-3): Few loose or mushy BMs; increase of 1-3 stools over normal daily number of stools; mild increase in ostomy output.   -  MODERATE (SCALE 4-7): Increase of 4-6 stools daily over normal; moderate increase in ostomy output. * SEVERE (SCALE 8-10; OR 'WORST POSSIBLE'): Increase of 7 or more stools daily over normal; moderate increase in ostomy output; incontinence.     Severe- 2 hour stretch-11:30-2:30 2. ONSET: "When did the diarrhea begin?"     11:30 3. BM CONSISTENCY: "How loose or watery is the diarrhea?"      watery 4. VOMITING: "Are you also vomiting?" If Yes, ask: "How many times in the past 24 hours?"      Yes- 2 hours as well- nausea 5. ABDOMINAL PAIN: "Are you having any abdominal pain?" If Yes, ask: "What does it feel like?" (e.g., crampy, dull, intermittent, constant)      No pain- onlt at start 6. ABDOMINAL PAIN SEVERITY: If present, ask: "How bad is the pain?"  (e.g., Scale 1-10; mild, moderate, or severe)   - MILD (1-3): doesn't interfere with normal activities, abdomen soft and not tender to touch    - MODERATE (4-7): interferes with normal activities or awakens from  sleep, abdomen tender to touch    - SEVERE (8-10): excruciating pain, doubled over, unable to do any normal activities       n/a 7. ORAL INTAKE: If vomiting, "Have you been able to drink liquids?" "How much liquids have you had in the past 24 hours?"     Ginger ale now- sips 8. HYDRATION: "Any signs of dehydration?" (e.g., dry mouth [not just dry lips], too weak to stand, dizziness, new weight loss) "When did you last urinate?"     Resting- patient is weak 9. EXPOSURE: "Have you traveled to a foreign country recently?" "Have you been exposed to anyone with diarrhea?" "Could you have eaten any food that was spoiled?"     Son's family has been sick 10. ANTIBIOTIC USE: "Are you taking antibiotics now or have you taken antibiotics in the past 2 months?"       no 11. OTHER SYMPTOMS: "Do you have any other symptoms?" (e.g., fever, blood in stool)       99.9- last night, 99.0 this morning 12. PREGNANCY: "Is there any chance you are pregnant?" "When was your last menstrual period?"       n/a  Protocols used: DIARRHEA-A-AH

## 2020-11-04 ENCOUNTER — Ambulatory Visit: Payer: Self-pay | Admitting: *Deleted

## 2020-11-04 NOTE — Telephone Encounter (Signed)
Just FYI. KW

## 2020-11-04 NOTE — Telephone Encounter (Signed)
Patient is calling to report that she is having continued fever and now has cough and sore throat. Patient has done several COVID home test- negative. Call to office- virtual appointment is needed- they are going to reach out to [facility] to see if patient can be seen.  Reason for Disposition . Fever present > 3 days (72 hours)  Answer Assessment - Initial Assessment Questions 1. TEMPERATURE: "What is the most recent temperature?"  "How was it measured?"      99.0 2. ONSET: "When did the fever start?"      Sunday night- along with other symptoms- yesterday afternoon 3. CHILLS: "Do you have chills?" If yes: "How bad are they?"  (e.g., none, mild, moderate, severe)   - NONE: no chills   - MILD: feeling cold   - MODERATE: feeling very cold, some shivering (feels better under a thick blanket)   - SEVERE: feeling extremely cold with shaking chills (general body shaking, rigors; even under a thick blanket)      none 4. OTHER SYMPTOMS: "Do you have any other symptoms besides the fever?"  (e.g., abdomen pain, cough, diarrhea, earache, headache, sore throat, urination pain)     Cough, sore throat 5. CAUSE: If there are no symptoms, ask: "What do you think is causing the fever?"      Exposure to COVID 6. CONTACTS: "Does anyone else in the family have an infection?"     granddaughter 7. TREATMENT: "What have you done so far to treat this fever?" (e.g., medications)     no 8. IMMUNOCOMPROMISE: "Do you have of the following: diabetes, HIV positive, splenectomy, cancer chemotherapy, chronic steroid treatment, transplant patient, etc."     ITP- splenectomy 9. PREGNANCY: "Is there any chance you are pregnant?" "When was your last menstrual period?"     n/a 10. TRAVEL: "Have you traveled out of the country in the last month?" (e.g., travel history, exposures)       exposure  Protocols used: FEVER-A-AH

## 2020-11-04 NOTE — Telephone Encounter (Signed)
Patient has been advised I arranged an appt tomorrow at 9:2AM at [facility]. KW

## 2020-11-05 ENCOUNTER — Telehealth: Payer: Medicare Other | Admitting: Nurse Practitioner

## 2020-11-05 NOTE — Progress Notes (Deleted)
There were no vitals taken for this visit.   Subjective:    Patient ID: Heidi Hernandez, female    DOB: May 14, 1951, 70 y.o.   MRN: [id number]  HPI: Heidi Hernandez is a 70 y.o. female  No chief complaint on file.  UPPER RESPIRATORY TRACT INFECTION Worst symptom: Fever: {Blank single:19197::"yes","no"} Cough: {Blank single:19197::"yes","no"} Shortness of breath: {Blank single:19197::"yes","no"} Wheezing: {Blank single:19197::"yes","no"} Chest pain: {Blank single:19197::"yes","no","yes, with cough"} Chest tightness: {Blank single:19197::"yes","no"} Chest congestion: {Blank single:19197::"yes","no"} Nasal congestion: {Blank single:19197::"yes","no"} Runny nose: {Blank single:19197::"yes","no"} Post nasal drip: {Blank single:19197::"yes","no"} Sneezing: {Blank single:19197::"yes","no"} Sore throat: {Blank single:19197::"yes","no"} Swollen glands: {Blank single:19197::"yes","no"} Sinus pressure: {Blank single:19197::"yes","no"} Headache: {Blank single:19197::"yes","no"} Face pain: {Blank single:19197::"yes","no"} Toothache: {Blank single:19197::"yes","no"} Ear pain: {Blank single:19197::"yes","no"} {Blank single:19197::""right","left","bilateral"} Ear pressure: {Blank single:19197::"yes","no"} {Blank single:19197::""right","left","bilateral"} Eyes red/itching:{Blank single:19197::"yes","no"} Eye drainage/crusting: {Blank single:19197::"yes","no"}  Vomiting: {Blank single:19197::"yes","no"} Rash: {Blank single:19197::"yes","no"} Fatigue: {Blank single:19197::"yes","no"} Sick contacts: {Blank single:19197::"yes","no"} Strep contacts: {Blank single:19197::"yes","no"}  Context: {Blank multiple:19196::"better","worse","stable","fluctuating"} Recurrent sinusitis: {Blank single:19197::"yes","no"} Relief with OTC cold/cough medications: {Blank single:19197::"yes","no"}  Treatments attempted: {Blank multiple:19196::"none","cold/sinus","mucinex","anti-histamine","pseudoephedrine","cough  syrup","antibiotics"}   Relevant past medical, surgical, family and social history reviewed and updated as indicated. Interim medical history since our last visit reviewed. Allergies and medications reviewed and updated.  Review of Systems  Per HPI unless specifically indicated above     Objective:    There were no vitals taken for this visit.  Wt Readings from Last 3 Encounters:  06/21/20 145 lb (65.8 kg)  06/03/20 152 lb (68.9 kg)  04/10/20 145 lb (65.8 kg)    Physical Exam  Results for orders placed or performed in visit on 10/26/20  Iron and TIBC  Result Value Ref Range   Iron 144 28 - 170 ug/dL   TIBC 212 248 - 250 ug/dL   Saturation Ratios 42 (H) 10.4 - 31.8 %   UIBC 196 ug/dL  Ferritin  Result Value Ref Range   Ferritin 22 11 - 307 ng/mL  Comprehensive metabolic panel  Result Value Ref Range   Sodium 142 135 - 145 mmol/L   Potassium 4.0 3.5 - 5.1 mmol/L   Chloride 108 98 - 111 mmol/L   CO2 22 22 - 32 mmol/L   Glucose, Bld 99 70 - 99 mg/dL   BUN 14 8 - 23 mg/dL   Creatinine, Ser 0.37 0.44 - 1.00 mg/dL   Calcium 9.0 8.9 - 04.8 mg/dL   Total Protein 7.6 6.5 - 8.1 g/dL   Albumin 4.4 3.5 - 5.0 g/dL   AST 22 15 - 41 U/L   ALT 16 0 - 44 U/L   Alkaline Phosphatase 66 38 - 126 U/L   Total Bilirubin 0.9 0.3 - 1.2 mg/dL   GFR, Estimated >88 >91 mL/min   Anion gap 12 5 - 15  CBC with Differential/Platelet  Result Value Ref Range   WBC 8.9 4.0 - 10.5 K/uL   RBC 4.67 3.87 - 5.11 MIL/uL   Hemoglobin 14.7 12.0 - 15.0 g/dL   HCT 69.4 50.3 - 88.8 %   MCV 91.4 80.0 - 100.0 fL   MCH 31.5 26.0 - 34.0 pg   MCHC 34.4 30.0 - 36.0 g/dL   RDW 28.0 03.4 - 91.7 %   Platelets 156 150 - 400 K/uL   nRBC 0.0 0.0 - 0.2 %   Neutrophils Relative % 49 %   Neutro Abs 4.4 1.7 - 7.7 K/uL   Lymphocytes Relative 38 %   Lymphs Abs 3.4 0.7 - 4.0 K/uL   Monocytes Relative 10 %  Monocytes Absolute 0.9 0.1 - 1.0 K/uL   Eosinophils Relative 2 %   Eosinophils Absolute 0.2 0.0 - 0.5 K/uL    Basophils Relative 1 %   Basophils Absolute 0.1 0.0 - 0.1 K/uL   WBC Morphology DIFF CONFIRMED BY MANUAL. MORPHOLOGY UNREMARKABLE    RBC Morphology UNREMARKABLE    Smear Review PLATELETS APPEAR ADEQUATE    Immature Granulocytes 0 %   Abs Immature Granulocytes 0.01 0.00 - 0.07 K/uL      Assessment & Plan:   Problem List Items Addressed This Visit   None      Follow up plan: No follow-ups on file.    This visit was completed via MyChart due to the restrictions of the COVID-19 pandemic. All issues as above were discussed and addressed. Physical exam was done as above through visual confirmation on MyChart. If it was felt that the patient should be evaluated in the office, they were directed there. The patient verbally consented to this visit. 1. Location of the patient: Home 2. Location of the provider: Office 3. Those involved with this call:  ? Provider: Larae Grooms ? CMA: Tiffany Reel, CMA ? Front Desk/Registration: Harriet Pho 4. Time spent on call: *** minutes with patient face to face via video conference. More than 50% of this time was spent in counseling and coordination of care. *** minutes total spent in review of patient's record and preparation of their chart.

## 2020-11-17 ENCOUNTER — Telehealth: Payer: Self-pay

## 2020-11-17 NOTE — Telephone Encounter (Signed)
Copied from CRM [id number]. Topic: General - Other >> Nov 17, 2020  8:45 AM Jaquita Rector A wrote: Reason for CRM: Patient called in to inform Dr Sherrie Mustache that when she was sick back on 11/02/20 she just found out that the [facility] Peanut Butter #425 that she had been eating is from the batch that have been recalled. She think this may have caused her to be sick that week. Just wanted to inform Dr Sherrie Mustache and pass on this information. Ph# [phone]

## 2020-11-20 ENCOUNTER — Encounter: Payer: Self-pay | Admitting: Oncology

## 2020-11-24 DIAGNOSIS — J3089 Other allergic rhinitis: Secondary | ICD-10-CM | POA: Diagnosis not present

## 2020-11-24 DIAGNOSIS — J301 Allergic rhinitis due to pollen: Secondary | ICD-10-CM | POA: Diagnosis not present

## 2020-11-24 DIAGNOSIS — J3081 Allergic rhinitis due to animal (cat) (dog) hair and dander: Secondary | ICD-10-CM | POA: Diagnosis not present

## 2020-12-03 DIAGNOSIS — G2581 Restless legs syndrome: Secondary | ICD-10-CM | POA: Diagnosis not present

## 2020-12-22 ENCOUNTER — Ambulatory Visit: Payer: Self-pay

## 2020-12-22 NOTE — Telephone Encounter (Signed)
is calling on 12-06-2020 she trip over door jam and hit her knee on concrete the knee is swollen may have fluid in it and hot to touch. I called [facility] no opening today. Pt can walk . Please advise  Reason for Disposition  [1] SEVERE pain (e.g., excruciating, unable to walk) AND [2] not improved after 2 hours of pain medicine  Answer Assessment - Initial Assessment Questions 1. LOCATION and RADIATION: "Where is the pain located?"      Left knee 2. QUALITY: "What does the pain feel like?"  (e.g., sharp, dull, aching, burning)     Aching 3. SEVERITY: "How bad is the pain?" "What does it keep you from doing?"   (Scale 1-10; or mild, moderate, severe)   -  MILD (1-3): doesn't interfere with normal activities    -  MODERATE (4-7): interferes with normal activities (e.g., work or school) or awakens from sleep, limping    -  SEVERE (8-10): excruciating pain, unable to do any normal activities, unable to walk     Moderate 4. ONSET: "When did the pain start?" "Does it come and go, or is it there all the time?"     12/06/20 5. RECURRENT: "Have you had this pain before?" If Yes, ask: "When, and what happened then?"     No 6. SETTING: "Has there been any recent work, exercise or other activity that involved that part of the body?"      Fell 12/06/20 7. AGGRAVATING FACTORS: "What makes the knee pain worse?" (e.g., walking, climbing stairs, running)     Walking, stairs 8. ASSOCIATED SYMPTOMS: "Is there any swelling or redness of the knee?"     Yes 9. OTHER SYMPTOMS: "Do you have any other symptoms?" (e.g., chest pain, difficulty breathing, fever, calf pain)     No 10. PREGNANCY: "Is there any chance you are pregnant?" "When was your last menstrual period?"       No  Protocols used: Knee Pain-A-AH

## 2020-12-22 NOTE — Telephone Encounter (Signed)
Pt. Fell on left knee 12/06/20 on concrete. Still has swelling and hot to touch. Hurts to walk and go up stairs.No availability in the practice. Pt. Will go to [facility].

## 2020-12-23 DIAGNOSIS — M7041 Prepatellar bursitis, right knee: Secondary | ICD-10-CM | POA: Diagnosis not present

## 2020-12-24 DIAGNOSIS — G2581 Restless legs syndrome: Secondary | ICD-10-CM | POA: Diagnosis not present

## 2020-12-31 DIAGNOSIS — J3081 Allergic rhinitis due to animal (cat) (dog) hair and dander: Secondary | ICD-10-CM | POA: Diagnosis not present

## 2020-12-31 DIAGNOSIS — J3089 Other allergic rhinitis: Secondary | ICD-10-CM | POA: Diagnosis not present

## 2020-12-31 DIAGNOSIS — J301 Allergic rhinitis due to pollen: Secondary | ICD-10-CM | POA: Diagnosis not present

## 2021-01-02 DIAGNOSIS — W57XXXA Bitten or stung by nonvenomous insect and other nonvenomous arthropods, initial encounter: Secondary | ICD-10-CM | POA: Diagnosis not present

## 2021-01-02 DIAGNOSIS — R112 Nausea with vomiting, unspecified: Secondary | ICD-10-CM | POA: Diagnosis not present

## 2021-01-02 DIAGNOSIS — S70262A Insect bite (nonvenomous), left hip, initial encounter: Secondary | ICD-10-CM | POA: Diagnosis not present

## 2021-01-05 DIAGNOSIS — J301 Allergic rhinitis due to pollen: Secondary | ICD-10-CM | POA: Diagnosis not present

## 2021-01-05 DIAGNOSIS — J3081 Allergic rhinitis due to animal (cat) (dog) hair and dander: Secondary | ICD-10-CM | POA: Diagnosis not present

## 2021-01-05 DIAGNOSIS — J3089 Other allergic rhinitis: Secondary | ICD-10-CM | POA: Diagnosis not present

## 2021-01-08 DIAGNOSIS — J3081 Allergic rhinitis due to animal (cat) (dog) hair and dander: Secondary | ICD-10-CM | POA: Diagnosis not present

## 2021-01-08 DIAGNOSIS — J3089 Other allergic rhinitis: Secondary | ICD-10-CM | POA: Diagnosis not present

## 2021-01-08 DIAGNOSIS — J301 Allergic rhinitis due to pollen: Secondary | ICD-10-CM | POA: Diagnosis not present

## 2021-01-11 DIAGNOSIS — J3081 Allergic rhinitis due to animal (cat) (dog) hair and dander: Secondary | ICD-10-CM | POA: Diagnosis not present

## 2021-01-12 DIAGNOSIS — J3081 Allergic rhinitis due to animal (cat) (dog) hair and dander: Secondary | ICD-10-CM | POA: Diagnosis not present

## 2021-01-12 DIAGNOSIS — J3089 Other allergic rhinitis: Secondary | ICD-10-CM | POA: Diagnosis not present

## 2021-01-12 DIAGNOSIS — J301 Allergic rhinitis due to pollen: Secondary | ICD-10-CM | POA: Diagnosis not present

## 2021-01-19 DIAGNOSIS — J301 Allergic rhinitis due to pollen: Secondary | ICD-10-CM | POA: Diagnosis not present

## 2021-01-19 DIAGNOSIS — J3089 Other allergic rhinitis: Secondary | ICD-10-CM | POA: Diagnosis not present

## 2021-01-19 DIAGNOSIS — J3081 Allergic rhinitis due to animal (cat) (dog) hair and dander: Secondary | ICD-10-CM | POA: Diagnosis not present

## 2021-01-21 DIAGNOSIS — G2581 Restless legs syndrome: Secondary | ICD-10-CM | POA: Diagnosis not present

## 2021-01-21 DIAGNOSIS — Z79899 Other long term (current) drug therapy: Secondary | ICD-10-CM | POA: Diagnosis not present

## 2021-01-26 DIAGNOSIS — J301 Allergic rhinitis due to pollen: Secondary | ICD-10-CM | POA: Diagnosis not present

## 2021-01-26 DIAGNOSIS — J3089 Other allergic rhinitis: Secondary | ICD-10-CM | POA: Diagnosis not present

## 2021-01-26 DIAGNOSIS — J3081 Allergic rhinitis due to animal (cat) (dog) hair and dander: Secondary | ICD-10-CM | POA: Diagnosis not present

## 2021-02-02 ENCOUNTER — Other Ambulatory Visit: Payer: Self-pay | Admitting: Family Medicine

## 2021-02-02 DIAGNOSIS — I10 Essential (primary) hypertension: Secondary | ICD-10-CM

## 2021-02-02 NOTE — Telephone Encounter (Signed)
Requested medication (s) are due for refill today: no  Requested medication (s) are on the active medication list:yes  Last refill:  08/04/2020  Future visit scheduled:no  Notes to clinic:  overdue for follow up appt  Message sent for patient to contact office for appt    Requested Prescriptions  Pending Prescriptions Disp Refills   amLODipine (NORVASC) 5 MG tablet [Pharmacy Med Name: AMLODIPINE BESYLATE 5 MG TAB] 30 tablet 5    Sig: TAKE 1 TABLET BY MOUTH ONCE DAILY      Cardiovascular:  Calcium Channel Blockers Failed - 02/02/2021  8:12 AM      Failed - Valid encounter within last 6 months    Recent Outpatient Visits           7 months ago Cough   [facility] Malva Limes, MD   8 months ago Need for influenza vaccination   [facility] Malva Limes, MD   9 months ago Bronchitis   [facility] [place], [place], [place]   1 year ago Restless legs syndrome   [facility] Malva Limes, MD   1 year ago Cough   [facility] Fl[facility], Eula Fried, FNP       Future Appointments             In 2 months Orlie Dakin, Tollie Pizza, MD [facility]             Passed - Last BP in normal range    BP Readings from Last 1 Encounters:  10/29/20 118/64

## 2021-02-09 DIAGNOSIS — J3081 Allergic rhinitis due to animal (cat) (dog) hair and dander: Secondary | ICD-10-CM | POA: Diagnosis not present

## 2021-02-09 DIAGNOSIS — J3089 Other allergic rhinitis: Secondary | ICD-10-CM | POA: Diagnosis not present

## 2021-02-09 DIAGNOSIS — J301 Allergic rhinitis due to pollen: Secondary | ICD-10-CM | POA: Diagnosis not present

## 2021-02-16 DIAGNOSIS — J301 Allergic rhinitis due to pollen: Secondary | ICD-10-CM | POA: Diagnosis not present

## 2021-02-16 DIAGNOSIS — J3081 Allergic rhinitis due to animal (cat) (dog) hair and dander: Secondary | ICD-10-CM | POA: Diagnosis not present

## 2021-02-16 DIAGNOSIS — J3089 Other allergic rhinitis: Secondary | ICD-10-CM | POA: Diagnosis not present

## 2021-02-23 DIAGNOSIS — J3081 Allergic rhinitis due to animal (cat) (dog) hair and dander: Secondary | ICD-10-CM | POA: Diagnosis not present

## 2021-02-23 DIAGNOSIS — J301 Allergic rhinitis due to pollen: Secondary | ICD-10-CM | POA: Diagnosis not present

## 2021-02-23 DIAGNOSIS — J3089 Other allergic rhinitis: Secondary | ICD-10-CM | POA: Diagnosis not present

## 2021-02-25 DIAGNOSIS — G2581 Restless legs syndrome: Secondary | ICD-10-CM | POA: Diagnosis not present

## 2021-03-09 DIAGNOSIS — J3081 Allergic rhinitis due to animal (cat) (dog) hair and dander: Secondary | ICD-10-CM | POA: Diagnosis not present

## 2021-03-09 DIAGNOSIS — J3089 Other allergic rhinitis: Secondary | ICD-10-CM | POA: Diagnosis not present

## 2021-03-09 DIAGNOSIS — J301 Allergic rhinitis due to pollen: Secondary | ICD-10-CM | POA: Diagnosis not present

## 2021-03-11 DIAGNOSIS — J301 Allergic rhinitis due to pollen: Secondary | ICD-10-CM | POA: Diagnosis not present

## 2021-03-11 DIAGNOSIS — J3089 Other allergic rhinitis: Secondary | ICD-10-CM | POA: Diagnosis not present

## 2021-03-11 DIAGNOSIS — J3081 Allergic rhinitis due to animal (cat) (dog) hair and dander: Secondary | ICD-10-CM | POA: Diagnosis not present

## 2021-03-25 DIAGNOSIS — J3081 Allergic rhinitis due to animal (cat) (dog) hair and dander: Secondary | ICD-10-CM | POA: Diagnosis not present

## 2021-03-25 DIAGNOSIS — J3089 Other allergic rhinitis: Secondary | ICD-10-CM | POA: Diagnosis not present

## 2021-03-25 DIAGNOSIS — J301 Allergic rhinitis due to pollen: Secondary | ICD-10-CM | POA: Diagnosis not present

## 2021-04-06 DIAGNOSIS — J3089 Other allergic rhinitis: Secondary | ICD-10-CM | POA: Diagnosis not present

## 2021-04-06 DIAGNOSIS — J3081 Allergic rhinitis due to animal (cat) (dog) hair and dander: Secondary | ICD-10-CM | POA: Diagnosis not present

## 2021-04-06 DIAGNOSIS — J301 Allergic rhinitis due to pollen: Secondary | ICD-10-CM | POA: Diagnosis not present

## 2021-04-13 ENCOUNTER — Telehealth: Payer: Self-pay

## 2021-04-13 DIAGNOSIS — J3089 Other allergic rhinitis: Secondary | ICD-10-CM | POA: Diagnosis not present

## 2021-04-13 DIAGNOSIS — J3081 Allergic rhinitis due to animal (cat) (dog) hair and dander: Secondary | ICD-10-CM | POA: Diagnosis not present

## 2021-04-13 DIAGNOSIS — J301 Allergic rhinitis due to pollen: Secondary | ICD-10-CM | POA: Diagnosis not present

## 2021-04-13 NOTE — Telephone Encounter (Signed)
She can have any of the 40 minute hospital follow up slots. Thanks!

## 2021-04-13 NOTE — Telephone Encounter (Signed)
Copied from CRM [id number]. Topic: Appointment Scheduling - Scheduling Inquiry for Clinic >> Apr 13, 2021 10:46 AM Pawlus, Maxine Glenn A wrote: Reason for CRM: Pt wanted to schedule a physical with Dr Sherrie Mustache before the end of the year due to insurance, pt wanted a call back to see if she can be worked in, please advise.

## 2021-04-14 DIAGNOSIS — J3089 Other allergic rhinitis: Secondary | ICD-10-CM | POA: Diagnosis not present

## 2021-04-22 DIAGNOSIS — J3081 Allergic rhinitis due to animal (cat) (dog) hair and dander: Secondary | ICD-10-CM | POA: Diagnosis not present

## 2021-04-22 DIAGNOSIS — J3089 Other allergic rhinitis: Secondary | ICD-10-CM | POA: Diagnosis not present

## 2021-04-22 DIAGNOSIS — J301 Allergic rhinitis due to pollen: Secondary | ICD-10-CM | POA: Diagnosis not present

## 2021-04-23 NOTE — Progress Notes (Signed)
[facility]  [phone]) [phone] [phone]) [phone]  ID: Heidi Hernandez OB: 08/26/1950  MR#: [id number]  WER#:[id number]  Patient Care Team: Malva Limes, MD as PCP - General (Family Medicine) Eileen Stanford, MD as Referring Physician (Allergy and Immunology) Sallee Lange, MD as Consulting Physician (Ophthalmology) Mickle Mallory (Gastroenterology) Idelle Crouch, MD as Referring Physician (Neurology) Morene Crocker, MD as Referring Physician (Neurology)   I connected with Heidi Hernandez on 04/29/21 at  2:45 PM EDT by video enabled telemedicine visit and verified that I am speaking with the correct person using two identifiers.   I discussed the limitations, risks, security and privacy concerns of performing an evaluation and management service by telemedicine and the availability of in-person appointments. I also discussed with the patient that there may be a patient responsible charge related to this service. The patient expressed understanding and agreed to proceed.   Other persons participating in the visit and their role in the encounter: Patient, MD.  Patient's location: Home. Provider's location: Clinic.  CHIEF COMPLAINT: [name]  INTERVAL HISTORY: Patient agreed to video assisted telemedicine visit for further evaluation and discussion of her laboratory results.  Her symptoms of restless leg have significantly improved since starting low-dose methadone.  She otherwise feels well and is asymptomatic.  He has no neurologic complaints today. She denies any recent fevers or illnesses.  She has a good appetite and denies weight loss.  She has no chest pain, shortness of breath, cough, or hemoptysis.  She denies any nausea, vomiting, constipation, or diarrhea.  She has no urinary complaints.  Patient offers no specific complaints today.  REVIEW OF SYSTEMS:   Review of Systems  Constitutional: Negative.  Negative for  fever, malaise/fatigue and weight loss.  Respiratory:  Negative for cough, hemoptysis and shortness of breath.   Cardiovascular: Negative.  Negative for chest pain and leg swelling.  Gastrointestinal:  Positive for heartburn. Negative for abdominal pain.  Genitourinary: Negative.  Negative for dysuria.  Musculoskeletal: Negative.  Negative for back pain.  Skin: Negative.  Negative for rash.  Neurological: Negative.  Negative for dizziness, focal weakness, weakness and headaches.  Psychiatric/Behavioral: Negative.  The patient does not have insomnia.    As per HPI. Otherwise, a complete review of systems is negative.  PAST MEDICAL HISTORY: Past Medical History:  Diagnosis Date   Anxiety    Asthma    GERD (gastroesophageal reflux disease)    Headache    Migraines   Hypertension    Idiopathic thrombocytopenia purpura (HCC)    Required splenectomy   MVP (mitral valve prolapse)    Neuromuscular disorder (HCC)    Restless Leg Syndrome   Pancreatitis April 2017 [facility] due to cholecystitis.    Pneumonia    06-2015    PAST SURGICAL HISTORY: Past Surgical History:  Procedure Laterality Date   ABDOMINAL HYSTERECTOMY     APPENDECTOMY     CARPAL TUNNEL RELEASE Bilateral 11/19/2014   Procedure: CARPAL TUNNEL RELEASE;  Surgeon: Donato Heinz, MD;  Location: [facility] ORS;  Service: Orthopedics;  Laterality: Bilateral;   CESAREAN SECTION     times 2   CHOLECYSTECTOMY  09/29/2015   [facility]   COLONOSCOPY N/A 05/02/2016   Procedure: COLONOSCOPY;  Surgeon: Scot Jun, MD;  Location: [facility] ENDOSCOPY;  Service: Endoscopy;  Laterality: N/A;   ESOPHAGOGASTRODUODENOSCOPY (EGD) WITH PROPOFOL N/A 05/02/2016   Procedure: ESOPHAGOGASTRODUODENOSCOPY (EGD) WITH PROPOFOL;  Surgeon: Scot Jun, MD;  Location: [facility] ENDOSCOPY;  Service: Endoscopy;  Laterality: N/A;   ESOPHAGOGASTRODUODENOSCOPY (EGD) WITH PROPOFOL N/A 11/22/2017   Procedure: ESOPHAGOGASTRODUODENOSCOPY (EGD) WITH PROPOFOL;  Surgeon:  Scot Jun, MD;  Location: [facility] ENDOSCOPY;  Service: Endoscopy;  Laterality: N/A;   HERNIA REPAIR     times 3   JOINT REPLACEMENT     OVARIAN CYST SURGERY     SHOULDER ARTHROSCOPY WITH ROTATOR CUFF REPAIR     times 2   SPLENECTOMY, TOTAL     TOTAL SHOULDER ARTHROPLASTY Right 04/21/2017   Procedure: REVERSE SHOULDER ARTHROPLASTY;  Surgeon: Signa Kell, MD;  Location: [facility] ORS;  Service: Orthopedics;  Laterality: Right;   TRIGGER FINGER RELEASE Right 11/19/2014   Procedure: RELEASE TRIGGER FINGER/A-1 PULLEY;  Surgeon: Donato Heinz, MD;  Location: [facility] ORS;  Service: Orthopedics;  Laterality: Right;   TRIGGER FINGER RELEASE Right 08/12/2015   Procedure: RELEASE TRIGGER LONG FINGER;  Surgeon: Donato Heinz, MD;  Location: [facility] ORS;  Service: Orthopedics;  Laterality: Right;    FAMILY HISTORY: Family History  Problem Relation Age of Onset   Hypertension Mother    Renal Disease Father        ESRD with HD    ADVANCED DIRECTIVES (Y/N):  N  HEALTH MAINTENANCE: Social History   Tobacco Use   Smoking status: Former    Packs/day: 1.00    Years: 10.00    Pack years: 10.00    Types: Cigarettes    Quit date: 11/10/1979    Years since quitting: 41.4   Smokeless tobacco: Never  Vaping Use   Vaping Use: Never used  Substance Use Topics   Alcohol use: Not Currently    Alcohol/week: 0.0 standard drinks   Drug use: No     Colonoscopy:  PAP:  Bone density:  Lipid panel:  Allergies  Allergen Reactions   Augmentin [Amoxicillin-Pot Clavulanate] Nausea And Vomiting   Droperidol Other (See Comments)    "Locked jaw" Facial muscles locked "Locked jaw"   Paroxetine Hcl Other (See Comments)    syncope Passed out    Morphine Itching    Current Outpatient Medications  Medication Sig Dispense Refill   albuterol (VENTOLIN HFA) 108 (90 Base) MCG/ACT inhaler Inhale 2 puffs into the lungs every 6 (six) hours as needed for wheezing. May substitute generic or equivalent brand name  18 g 1   albuterol (VENTOLIN HFA) 108 (90 Base) MCG/ACT inhaler Inhale 2 puffs into the lungs every 6 (six) hours as needed for wheezing or shortness of breath. 8 g 2   alendronate (FOSAMAX) 70 MG tablet TAKE ONE TABLET BY MOUTH EACH WEEK, ON AN EMPTY STOMACH BEFORE BREAKFAST WITH 8oz OF WATER AND REMAIN UPRIGHT FOR :30 12 tablet 4   amLODipine (NORVASC) 5 MG tablet TAKE 1 TABLET BY MOUTH ONCE DAILY 30 tablet 0   atenolol (TENORMIN) 50 MG tablet TAKE 1 TABLET BY MOUTH ONCE DAILY 90 tablet 1   azelastine (ASTELIN) 0.1 % nasal spray Place 2 sprays into both nostrils daily.      carbidopa-levodopa (SINEMET IR) 25-100 MG tablet Take 1 tablet by mouth in the morning and at bedtime.     chlorpheniramine-HYDROcodone (TUSSIONEX PENNKINETIC ER) 10-8 MG/5ML SUER Take 5 mLs by mouth at bedtime as needed for cough. 115 mL 0   clonazePAM (KLONOPIN) 0.5 MG tablet Take 0.5 mg by mouth at bedtime. 0.25 mg in the morning     EPINEPHrine 0.3 mg/0.3 mL IJ SOAJ injection as needed.     Fexofenadine HCl (ALLEGRA PO) Take 1  capsule by mouth daily.     fluticasone (FLONASE) 50 MCG/ACT nasal spray Place 2 sprays into both nostrils daily.      fluticasone (FLOVENT HFA) 44 MCG/ACT inhaler Inhale 1 puff into the lungs daily.     methadone (DOLOPHINE) 5 MG tablet Take 1/2 tab (2.5 mg) daily for RLS.     montelukast (SINGULAIR) 10 MG tablet Take 10 mg by mouth at bedtime.      omeprazole (PRILOSEC) 40 MG capsule Take 40 mg by mouth daily.     ondansetron (ZOFRAN ODT) 4 MG disintegrating tablet Take 1 tablet (4 mg total) by mouth every 8 (eight) hours as needed for nausea or vomiting. 20 tablet 0   pramipexole (MIRAPEX) 0.5 MG tablet Take 1 tablet by mouth 6 (six) times daily.     SUMAtriptan (IMITREX) 100 MG tablet TAKE 1 TABLET BY MOUTH AT ONSET OF HEADACHE. MAY TAKE AN ADDITIONAL TABLET IN 2 HOURS IF NEEDED. MAX OF 200mg /24hr 9 tablet 2   terbinafine (LAMISIL) 250 MG tablet Take 2 tablets (500 mg total) by mouth daily. Take  for 1 week out of every 4 weeks 14 tablet 3   Vitamin D, Ergocalciferol, (DRISDOL) 1.25 MG (50000 UNIT) CAPS capsule TAKE 1 CAPSULE BY MOUTH ONCE A WEEK 12 capsule 4   zolpidem (AMBIEN) 5 MG tablet Take 1 tablet by mouth at bedtime.     No current facility-administered medications for this visit.    OBJECTIVE: There were no vitals filed for this visit.   There is no height or weight on file to calculate BMI.    ECOG FS:0 - Asymptomatic  General: Well-developed, well-nourished, no acute distress. HEENT: Normocephalic. Neuro: Alert, answering all questions appropriately. Cranial nerves grossly intact. Psych: Normal affect.   LAB RESULTS:  Lab Results  Component Value Date   NA 139 04/28/2021   K 3.7 04/28/2021   CL 108 04/28/2021   CO2 26 04/28/2021   GLUCOSE 82 04/28/2021   BUN 18 04/28/2021   CREATININE 0.63 04/28/2021   CALCIUM 8.6 (L) 04/28/2021   PROT 6.9 04/28/2021   ALBUMIN 4.1 04/28/2021   AST 21 04/28/2021   ALT 15 04/28/2021   ALKPHOS 61 04/28/2021   BILITOT 0.5 04/28/2021   GFRNONAA >60 04/28/2021   GFRAA 99 06/04/2020    Lab Results  Component Value Date   WBC 8.8 04/28/2021   NEUTROABS 4.7 04/28/2021   HGB 13.2 04/28/2021   HCT 38.2 04/28/2021   MCV 92.9 04/28/2021   PLT 129 (L) 04/28/2021   Lab Results  Component Value Date   IRON 127 04/28/2021   TIBC 286 04/28/2021   IRONPCTSAT 45 (H) 04/28/2021   Lab Results  Component Value Date   FERRITIN 33 04/28/2021     STUDIES: No results found.  ASSESSMENT: [name].  PLAN:    1. [name]: Patient is homozygous.  Her ferritin level continues to be within normal limits at 33, but her saturation ratio is mildly elevated at 45%.  She last received phlebotomy 6 months ago.  After discussion with the patient, we agreed that no phlebotomy is necessary at this time, but she likely would require treatment in 6 months.  Return to clinic in 6 months with repeat  laboratory work, further evaluation, and continuation of phlebotomy if needed.   2.  Restless leg syndrome: Significantly improved with low-dose methadone.  Continue monitoring and treatment per neurology.   3.  Thrombocytopenia: Mild, monitor. 4.  Heartburn: Continue follow-up with GI  as scheduled.  I provided 20 minutes of face-to-face video visit time during this encounter which included chart review, counseling, and coordination of care as documented above.   Patient expressed understanding and was in agreement with this plan. She also understands that She can call clinic at any time with any questions, concerns, or complaints.    Jeralyn Ruths, MD   04/29/2021 3:46 PM

## 2021-04-27 DIAGNOSIS — Z23 Encounter for immunization: Secondary | ICD-10-CM | POA: Diagnosis not present

## 2021-04-27 DIAGNOSIS — R079 Chest pain, unspecified: Secondary | ICD-10-CM | POA: Diagnosis not present

## 2021-04-27 DIAGNOSIS — I341 Nonrheumatic mitral (valve) prolapse: Secondary | ICD-10-CM | POA: Diagnosis not present

## 2021-04-27 DIAGNOSIS — I10 Essential (primary) hypertension: Secondary | ICD-10-CM | POA: Diagnosis not present

## 2021-04-27 DIAGNOSIS — R0789 Other chest pain: Secondary | ICD-10-CM | POA: Diagnosis not present

## 2021-04-28 ENCOUNTER — Inpatient Hospital Stay: Payer: Medicare Other | Attending: Oncology

## 2021-04-28 ENCOUNTER — Other Ambulatory Visit: Payer: Self-pay

## 2021-04-28 LAB — CBC WITH DIFFERENTIAL/PLATELET
Abs Immature Granulocytes: 0.02 10*3/uL (ref 0.00–0.07)
Basophils Absolute: 0.1 10*3/uL (ref 0.0–0.1)
Basophils Relative: 1 %
Eosinophils Absolute: 0.2 10*3/uL (ref 0.0–0.5)
Eosinophils Relative: 2 %
HCT: 38.2 % (ref 36.0–46.0)
Hemoglobin: 13.2 g/dL (ref 12.0–15.0)
Immature Granulocytes: 0 %
Lymphocytes Relative: 34 %
Lymphs Abs: 3 10*3/uL (ref 0.7–4.0)
MCH: 32.1 pg (ref 26.0–34.0)
MCHC: 34.6 g/dL (ref 30.0–36.0)
MCV: 92.9 fL (ref 80.0–100.0)
Monocytes Absolute: 0.8 10*3/uL (ref 0.1–1.0)
Monocytes Relative: 9 %
Neutro Abs: 4.7 10*3/uL (ref 1.7–7.7)
Neutrophils Relative %: 54 %
Platelets: 129 10*3/uL — ABNORMAL LOW (ref 150–400)
RBC: 4.11 MIL/uL (ref 3.87–5.11)
RDW: 14.8 % (ref 11.5–15.5)
Smear Review: DECREASED
WBC: 8.8 10*3/uL (ref 4.0–10.5)
nRBC: 0 % (ref 0.0–0.2)

## 2021-04-28 LAB — COMPREHENSIVE METABOLIC PANEL
ALT: 15 U/L (ref 0–44)
AST: 21 U/L (ref 15–41)
Albumin: 4.1 g/dL (ref 3.5–5.0)
Alkaline Phosphatase: 61 U/L (ref 38–126)
Anion gap: 5 (ref 5–15)
BUN: 18 mg/dL (ref 8–23)
CO2: 26 mmol/L (ref 22–32)
Calcium: 8.6 mg/dL — ABNORMAL LOW (ref 8.9–10.3)
Chloride: 108 mmol/L (ref 98–111)
Creatinine, Ser: 0.63 mg/dL (ref 0.44–1.00)
GFR, Estimated: >60 mL/min (ref >60)
Glucose, Bld: 82 mg/dL (ref 70–99)
Potassium: 3.7 mmol/L (ref 3.5–5.1)
Sodium: 139 mmol/L (ref 135–145)
Total Bilirubin: 0.5 mg/dL (ref 0.3–1.2)
Total Protein: 6.9 g/dL (ref 6.5–8.1)

## 2021-04-28 LAB — IRON AND TIBC
Iron: 127 ug/dL (ref 28–170)
Saturation Ratios: 45 % — ABNORMAL HIGH (ref 10.4–31.8)
TIBC: 286 ug/dL (ref 250–450)
UIBC: 159 ug/dL

## 2021-04-28 LAB — FERRITIN: Ferritin: 33 ng/mL (ref 11–307)

## 2021-04-29 ENCOUNTER — Inpatient Hospital Stay (HOSPITAL_BASED_OUTPATIENT_CLINIC_OR_DEPARTMENT_OTHER): Payer: Medicare Other | Admitting: Oncology

## 2021-04-29 NOTE — Progress Notes (Signed)
Pt confirmed availability for virtual visit as scheduled. Pt states she noticed her saturation level was elevated on labs and anticipates phlebotomy. Pt c/o intense heart burn but is following up with Cardioologist and GI. Pt recently "started taking small dose of methadone for restless legs." No other concerns/complaints at this time.

## 2021-05-02 DIAGNOSIS — K219 Gastro-esophageal reflux disease without esophagitis: Secondary | ICD-10-CM | POA: Diagnosis not present

## 2021-05-02 DIAGNOSIS — R1013 Epigastric pain: Secondary | ICD-10-CM | POA: Diagnosis not present

## 2021-05-02 DIAGNOSIS — R0989 Other specified symptoms and signs involving the circulatory and respiratory systems: Secondary | ICD-10-CM | POA: Diagnosis not present

## 2021-05-02 DIAGNOSIS — Z79899 Other long term (current) drug therapy: Secondary | ICD-10-CM | POA: Diagnosis not present

## 2021-05-02 DIAGNOSIS — I714 Abdominal aortic aneurysm, without rupture, unspecified: Secondary | ICD-10-CM | POA: Diagnosis not present

## 2021-05-02 DIAGNOSIS — G43009 Migraine without aura, not intractable, without status migrainosus: Secondary | ICD-10-CM | POA: Diagnosis not present

## 2021-05-02 DIAGNOSIS — Z9049 Acquired absence of other specified parts of digestive tract: Secondary | ICD-10-CM | POA: Diagnosis not present

## 2021-05-02 DIAGNOSIS — I341 Nonrheumatic mitral (valve) prolapse: Secondary | ICD-10-CM | POA: Diagnosis not present

## 2021-05-02 DIAGNOSIS — R110 Nausea: Secondary | ICD-10-CM | POA: Diagnosis not present

## 2021-05-02 DIAGNOSIS — I10 Essential (primary) hypertension: Secondary | ICD-10-CM | POA: Diagnosis not present

## 2021-05-02 DIAGNOSIS — R0602 Shortness of breath: Secondary | ICD-10-CM | POA: Diagnosis not present

## 2021-05-02 DIAGNOSIS — R001 Bradycardia, unspecified: Secondary | ICD-10-CM | POA: Diagnosis not present

## 2021-05-02 DIAGNOSIS — R350 Frequency of micturition: Secondary | ICD-10-CM | POA: Diagnosis not present

## 2021-05-02 DIAGNOSIS — Z20822 Contact with and (suspected) exposure to covid-19: Secondary | ICD-10-CM | POA: Diagnosis not present

## 2021-05-02 DIAGNOSIS — R0789 Other chest pain: Secondary | ICD-10-CM | POA: Diagnosis not present

## 2021-05-02 DIAGNOSIS — I440 Atrioventricular block, first degree: Secondary | ICD-10-CM | POA: Diagnosis not present

## 2021-05-02 DIAGNOSIS — G2581 Restless legs syndrome: Secondary | ICD-10-CM | POA: Diagnosis not present

## 2021-05-02 DIAGNOSIS — R079 Chest pain, unspecified: Secondary | ICD-10-CM | POA: Diagnosis not present

## 2021-05-04 DIAGNOSIS — J3089 Other allergic rhinitis: Secondary | ICD-10-CM | POA: Diagnosis not present

## 2021-05-04 DIAGNOSIS — J3081 Allergic rhinitis due to animal (cat) (dog) hair and dander: Secondary | ICD-10-CM | POA: Diagnosis not present

## 2021-05-04 DIAGNOSIS — J301 Allergic rhinitis due to pollen: Secondary | ICD-10-CM | POA: Diagnosis not present

## 2021-05-05 DIAGNOSIS — K2100 Gastro-esophageal reflux disease with esophagitis, without bleeding: Secondary | ICD-10-CM | POA: Diagnosis not present

## 2021-05-05 DIAGNOSIS — R0789 Other chest pain: Secondary | ICD-10-CM | POA: Diagnosis not present

## 2021-05-05 DIAGNOSIS — Z8719 Personal history of other diseases of the digestive system: Secondary | ICD-10-CM | POA: Diagnosis not present

## 2021-05-10 DIAGNOSIS — R051 Acute cough: Secondary | ICD-10-CM | POA: Diagnosis not present

## 2021-05-10 DIAGNOSIS — Z03818 Encounter for observation for suspected exposure to other biological agents ruled out: Secondary | ICD-10-CM | POA: Diagnosis not present

## 2021-05-10 DIAGNOSIS — J019 Acute sinusitis, unspecified: Secondary | ICD-10-CM | POA: Diagnosis not present

## 2021-05-12 ENCOUNTER — Ambulatory Visit: Payer: Self-pay | Admitting: *Deleted

## 2021-05-12 NOTE — Telephone Encounter (Signed)
Patient called in says temperature goes up and down and had a temp o 99.9 this morning and sinus headache. She wants to see about getting  stronger antibiotic then what urgent care gave sent to [facility] [address] [place]  [phone]       Called patient to review symptoms of fever and sinus headache symptoms. Noted patient voice very hoarse. C/o coughing spells and causing vomiting at times. C/o deep cough and coughing up thick green sputum. Denies chest pain , difficulty breathing. Patient was seen at [facility] on Monday and started on antibiotics. Reports she is having worsening cough. Negative for flu A/B, RSV and covid. Coughing spells causing vomiting at times. Fever 99.9 this am. Patient requesting stronger antibiotic and requesting if she can continue to take methadone with antibiotics prescribed.  Can patient start taking mucinex. Reports she may need doxycyline prescribed due to hx no spleen. Please advise and call back. Care advise given. Patient verbalized understanding of care advise and to call back or go to [facility] or ED if symptoms worsen.

## 2021-05-12 NOTE — Telephone Encounter (Signed)
I called and advised patient of message below. Patient says taht she usually has to have 2 round of antibiotics. Patient wants to know if Dr. Sherrie Mustache would give her a 2nd round of antibiotics if this current antibiotic doesn't resolve all her symptoms?  She also wants to know if she can take Mucinex while taking methadone? Patient is going to contact the pharmacy and ask about whether she can take mucinex while taking methadone. Please advise. Patient is aware that Dr. Sherrie Mustache is out of the office this evening.

## 2021-05-12 NOTE — Telephone Encounter (Signed)
Reason for Disposition  SEVERE coughing spells (e.g., whooping sound after coughing, vomiting after coughing)  Answer Assessment - Initial Assessment Questions 2. SEVERITY: "How bad is the cough today?"      Deep cough  3. SPUTUM: "Describe the color of your sputum" (none, dry cough; clear, white, yellow, green)     Thick green  4. HEMOPTYSIS: "Are you coughing up any blood?" If so ask: "How much?" (flecks, streaks, tablespoons, etc.)     na 5. DIFFICULTY BREATHING: "Are you having difficulty breathing?" If Yes, ask: "How bad is it?" (e.g., mild, moderate, severe)    - MILD: No SOB at rest, mild SOB with walking, speaks normally in sentences, can lie down, no retractions, pulse < 100.    - MODERATE: SOB at rest, SOB with minimal exertion and prefers to sit, cannot lie down flat, speaks in phrases, mild retractions, audible wheezing, pulse 100-120.    - SEVERE: Very SOB at rest, speaks in single words, struggling to breathe, sitting hunched forward, retractions, pulse > 120      no 6. FEVER: "Do you have a fever?" If Yes, ask: "What is your temperature, how was it measured, and when did it start?"     99.9 7. CARDIAC HISTORY: "Do you have any history of heart disease?" (e.g., heart attack, congestive heart failure)      Hx no spleen  8. LUNG HISTORY: "Do you have any history of lung disease?"  (e.g., pulmonary embolus, asthma, emphysema)     na 9. PE RISK FACTORS: "Do you have a history of blood clots?" (or: recent major surgery, recent prolonged travel, bedridden)     na 10. OTHER SYMPTOMS: "Do you have any other symptoms?" (e.g., runny nose, wheezing, chest pain)       Cough , hoarse, fever 11. PREGNANCY: "Is there any chance you are pregnant?" "When was your last menstrual period?"       na 12. TRAVEL: "Have you traveled out of the country in the last month?" (e.g., travel history, exposures)       na  Protocols used: Cough - Chronic-A-AH

## 2021-05-15 DIAGNOSIS — I10 Essential (primary) hypertension: Secondary | ICD-10-CM | POA: Diagnosis not present

## 2021-05-15 DIAGNOSIS — Z79899 Other long term (current) drug therapy: Secondary | ICD-10-CM | POA: Diagnosis not present

## 2021-05-15 DIAGNOSIS — Z7951 Long term (current) use of inhaled steroids: Secondary | ICD-10-CM | POA: Diagnosis not present

## 2021-05-15 DIAGNOSIS — J45909 Unspecified asthma, uncomplicated: Secondary | ICD-10-CM | POA: Diagnosis not present

## 2021-05-15 DIAGNOSIS — R0602 Shortness of breath: Secondary | ICD-10-CM | POA: Diagnosis not present

## 2021-05-15 DIAGNOSIS — R001 Bradycardia, unspecified: Secondary | ICD-10-CM | POA: Diagnosis not present

## 2021-05-15 DIAGNOSIS — I440 Atrioventricular block, first degree: Secondary | ICD-10-CM | POA: Diagnosis not present

## 2021-05-15 DIAGNOSIS — Z7983 Long term (current) use of bisphosphonates: Secondary | ICD-10-CM | POA: Diagnosis not present

## 2021-05-15 DIAGNOSIS — R059 Cough, unspecified: Secondary | ICD-10-CM | POA: Diagnosis not present

## 2021-05-15 DIAGNOSIS — K219 Gastro-esophageal reflux disease without esophagitis: Secondary | ICD-10-CM | POA: Diagnosis not present

## 2021-05-15 DIAGNOSIS — Z20822 Contact with and (suspected) exposure to covid-19: Secondary | ICD-10-CM | POA: Diagnosis not present

## 2021-05-15 DIAGNOSIS — J4541 Moderate persistent asthma with (acute) exacerbation: Secondary | ICD-10-CM | POA: Diagnosis not present

## 2021-05-15 DIAGNOSIS — Z885 Allergy status to narcotic agent status: Secondary | ICD-10-CM | POA: Diagnosis not present

## 2021-05-15 DIAGNOSIS — M791 Myalgia, unspecified site: Secondary | ICD-10-CM | POA: Diagnosis not present

## 2021-05-16 DIAGNOSIS — J4541 Moderate persistent asthma with (acute) exacerbation: Secondary | ICD-10-CM | POA: Diagnosis not present

## 2021-05-18 DIAGNOSIS — J4530 Mild persistent asthma, uncomplicated: Secondary | ICD-10-CM | POA: Diagnosis not present

## 2021-05-31 ENCOUNTER — Ambulatory Visit (INDEPENDENT_AMBULATORY_CARE_PROVIDER_SITE_OTHER): Payer: Medicare Other

## 2021-05-31 DIAGNOSIS — Z1231 Encounter for screening mammogram for malignant neoplasm of breast: Secondary | ICD-10-CM

## 2021-05-31 DIAGNOSIS — Z0000 Encounter for general adult medical examination without abnormal findings: Secondary | ICD-10-CM

## 2021-05-31 DIAGNOSIS — Z780 Asymptomatic menopausal state: Secondary | ICD-10-CM | POA: Diagnosis not present

## 2021-05-31 NOTE — Patient Instructions (Signed)
Heidi Hernandez , Thank you for taking time to come for your Medicare Wellness Visit. I appreciate your ongoing commitment to your health goals. Please review the following plan we discussed and let me know if I can assist you in the future.   Screening recommendations/referrals: Colonoscopy: 2017 Mammogram: 2020- referral made Bone Density: 2020 - referral made Recommended yearly ophthalmology/optometry visit for glaucoma screening and checkup Recommended yearly dental visit for hygiene and checkup  Vaccinations: Influenza vaccine: 06/03/20, now due Pneumococcal vaccine: 01/15/18 Tdap vaccine: 05/17/11, now due Shingles vaccine: n/d   Covid-19:08/18/19, 09/10/19  Advanced directives: declined  Conditions/risks identified:   Next appointment: Follow up in one year for your annual wellness visit    Preventive Care 65 Years and Older, Female Preventive care refers to lifestyle choices and visits with your health care provider that can promote health and wellness. What does preventive care include? A yearly physical exam. This is also called an annual well check. Dental exams once or twice a year. Routine eye exams. Ask your health care provider how often you should have your eyes checked. Personal lifestyle choices, including: Daily care of your teeth and gums. Regular physical activity. Eating a healthy diet. Avoiding tobacco and drug use. Limiting alcohol use. Practicing safe sex. Taking low-dose aspirin every day. Taking vitamin and mineral supplements as recommended by your health care provider. What happens during an annual well check? The services and screenings done by your health care provider during your annual well check will depend on your age, overall health, lifestyle risk factors, and family history of disease. Counseling  Your health care provider may ask you questions about your: Alcohol use. Tobacco use. Drug use. Emotional well-being. Home and relationship  well-being. Sexual activity. Eating habits. History of falls. Memory and ability to understand (cognition). Work and work Astronomer. Reproductive health. Screening  You may have the following tests or measurements: Height, weight, and BMI. Blood pressure. Lipid and cholesterol levels. These may be checked every 5 years, or more frequently if you are over 36 years old. Skin check. Lung cancer screening. You may have this screening every year starting at age 72 if you have a 30-pack-year history of smoking and currently smoke or have quit within the past 15 years. Fecal occult blood test (FOBT) of the stool. You may have this test every year starting at age 72. Flexible sigmoidoscopy or colonoscopy. You may have a sigmoidoscopy every 5 years or a colonoscopy every 10 years starting at age 72. Hepatitis C blood test. Hepatitis B blood test. Sexually transmitted disease (STD) testing. Diabetes screening. This is done by checking your blood sugar (glucose) after you have not eaten for a while (fasting). You may have this done every 1-3 years. Bone density scan. This is done to screen for osteoporosis. You may have this done starting at age 72. Mammogram. This may be done every 1-2 years. Talk to your health care provider about how often you should have regular mammograms. Talk with your health care provider about your test results, treatment options, and if necessary, the need for more tests. Vaccines  Your health care provider may recommend certain vaccines, such as: Influenza vaccine. This is recommended every year. Tetanus, diphtheria, and acellular pertussis (Tdap, Td) vaccine. You may need a Td booster every 10 years. Zoster vaccine. You may need this after age 7. Pneumococcal 13-valent conjugate (PCV13) vaccine. One dose is recommended after age 72. Pneumococcal polysaccharide (PPSV23) vaccine. One dose is recommended after age 72. Talk to  your health care provider about which  screenings and vaccines you need and how often you need them. This information is not intended to replace advice given to you by your health care provider. Make sure you discuss any questions you have with your health care provider. Document Released: 07/10/2015 Document Revised: 03/02/2016 Document Reviewed: 04/14/2015 Elsevier Interactive Patient Education  2017 [place] Prevention in the Home Falls can cause injuries. They can happen to people of all ages. There are many things you can do to make your home safe and to help prevent falls. What can I do on the outside of my home? Regularly fix the edges of walkways and driveways and fix any cracks. Remove anything that might make you trip as you walk through a door, such as a raised step or threshold. Trim any bushes or trees on the path to your home. Use bright outdoor lighting. Clear any walking paths of anything that might make someone trip, such as rocks or tools. Regularly check to see if handrails are loose or broken. Make sure that both sides of any steps have handrails. Any raised decks and porches should have guardrails on the edges. Have any leaves, snow, or ice cleared regularly. Use sand or salt on walking paths during winter. Clean up any spills in your garage right away. This includes oil or grease spills. What can I do in the bathroom? Use night lights. Install grab bars by the toilet and in the tub and shower. Do not use towel bars as grab bars. Use non-skid mats or decals in the tub or shower. If you need to sit down in the shower, use a plastic, non-slip stool. Keep the floor dry. Clean up any water that spills on the floor as soon as it happens. Remove soap buildup in the tub or shower regularly. Attach bath mats securely with double-sided non-slip rug tape. Do not have throw rugs and other things on the floor that can make you trip. What can I do in the bedroom? Use night lights. Make sure that you have a  light by your bed that is easy to reach. Do not use any sheets or blankets that are too big for your bed. They should not hang down onto the floor. Have a firm chair that has side arms. You can use this for support while you get dressed. Do not have throw rugs and other things on the floor that can make you trip. What can I do in the kitchen? Clean up any spills right away. Avoid walking on wet floors. Keep items that you use a lot in easy-to-reach places. If you need to reach something above you, use a strong step stool that has a grab bar. Keep electrical cords out of the way. Do not use floor polish or wax that makes floors slippery. If you must use wax, use non-skid floor wax. Do not have throw rugs and other things on the floor that can make you trip. What can I do with my stairs? Do not leave any items on the stairs. Make sure that there are handrails on both sides of the stairs and use them. Fix handrails that are broken or loose. Make sure that handrails are as long as the stairways. Check any carpeting to make sure that it is firmly attached to the stairs. Fix any carpet that is loose or worn. Avoid having throw rugs at the top or bottom of the stairs. If you do have throw rugs, attach  them to the floor with carpet tape. Make sure that you have a light switch at the top of the stairs and the bottom of the stairs. If you do not have them, ask someone to add them for you. What else can I do to help prevent falls? Wear shoes that: Do not have high heels. Have rubber bottoms. Are comfortable and fit you well. Are closed at the toe. Do not wear sandals. If you use a stepladder: Make sure that it is fully opened. Do not climb a closed stepladder. Make sure that both sides of the stepladder are locked into place. Ask someone to hold it for you, if possible. Clearly mark and make sure that you can see: Any grab bars or handrails. First and last steps. Where the edge of each step  is. Use tools that help you move around (mobility aids) if they are needed. These include: Canes. Walkers. Scooters. Crutches. Turn on the lights when you go into a dark area. Replace any light bulbs as soon as they burn out. Set up your furniture so you have a clear path. Avoid moving your furniture around. If any of your floors are uneven, fix them. If there are any pets around you, be aware of where they are. Review your medicines with your doctor. Some medicines can make you feel dizzy. This can increase your chance of falling. Ask your doctor what other things that you can do to help prevent falls. This information is not intended to replace advice given to you by your health care provider. Make sure you discuss any questions you have with your health care provider. Document Released: 04/09/2009 Document Revised: 11/19/2015 Document Reviewed: 07/18/2014 Elsevier Interactive Patient Education  2017 [employer].

## 2021-05-31 NOTE — Progress Notes (Addendum)
Virtual Visit via Telephone Note  I connected with  Heidi Hernandez on 05/31/21 at  9:40 AM EST by telephone and verified that I am speaking with the correct person using two identifiers.  Location: Patient: home  Provider: [facility] Persons participating in the virtual visit: patient/Nurse Health Advisor   I discussed the limitations, risks, security and privacy concerns of performing an evaluation and management service by telephone and the availability of in person appointments. The patient expressed understanding and agreed to proceed.  Interactive audio and video telecommunications were attempted between this nurse and patient, however failed, due to patient having technical difficulties OR patient did not have access to video capability.  We continued and completed visit with audio only.  Some vital signs may be absent or patient reported.   Hal Hope, LPN    Subjective:   Heidi Hernandez is a 70 y.o. female who presents for Medicare Annual (Subsequent) preventive examination.  Review of Systems     Cardiac Risk Factors include: advanced age (>4men, >41 women)     Objective:    There were no vitals filed for this visit. There is no height or weight on file to calculate BMI.  Advanced Directives 05/31/2021 06/21/2020 03/25/2020 03/16/2020 04/24/2019 03/12/2019 08/31/2018  Does Patient Have a Medical Advance Directive? No No No No No No No  Would patient like information on creating a medical advance directive? No - Patient declined No - Patient declined No - Patient declined No - Patient declined - No - Patient declined -    Current Medications (verified) Outpatient Encounter Medications as of 05/31/2021  Medication Sig   albuterol (VENTOLIN HFA) 108 (90 Base) MCG/ACT inhaler Inhale 2 puffs into the lungs every 6 (six) hours as needed for wheezing. May substitute generic or equivalent brand name   albuterol (VENTOLIN HFA) 108 (90 Base) MCG/ACT inhaler Inhale 2 puffs into  the lungs every 6 (six) hours as needed for wheezing or shortness of breath.   amLODipine (NORVASC) 5 MG tablet TAKE 1 TABLET BY MOUTH ONCE DAILY   atenolol (TENORMIN) 50 MG tablet TAKE 1 TABLET BY MOUTH ONCE DAILY   azelastine (ASTELIN) 0.1 % nasal spray Place 2 sprays into both nostrils daily.    Fexofenadine HCl (ALLEGRA PO) Take 1 capsule by mouth daily.   fluticasone (FLONASE) 50 MCG/ACT nasal spray Place 2 sprays into both nostrils daily.    fluticasone (FLOVENT HFA) 44 MCG/ACT inhaler Inhale 1 puff into the lungs daily.   methadone (DOLOPHINE) 5 MG tablet Take 1/2 tab (2.5 mg) daily for RLS.   montelukast (SINGULAIR) 10 MG tablet Take 10 mg by mouth at bedtime.    omeprazole (PRILOSEC) 40 MG capsule Take 40 mg by mouth daily.   ondansetron (ZOFRAN ODT) 4 MG disintegrating tablet Take 1 tablet (4 mg total) by mouth every 8 (eight) hours as needed for nausea or vomiting.   pramipexole (MIRAPEX) 0.5 MG tablet Take 1 tablet by mouth 6 (six) times daily.   SUMAtriptan (IMITREX) 100 MG tablet TAKE 1 TABLET BY MOUTH AT ONSET OF HEADACHE. MAY TAKE AN ADDITIONAL TABLET IN 2 HOURS IF NEEDED. MAX OF 200mg /24hr   Vitamin D, Ergocalciferol, (DRISDOL) 1.25 MG (50000 UNIT) CAPS capsule TAKE 1 CAPSULE BY MOUTH ONCE A WEEK   alendronate (FOSAMAX) 70 MG tablet TAKE ONE TABLET BY MOUTH EACH WEEK, ON AN EMPTY STOMACH BEFORE BREAKFAST WITH 8oz OF WATER AND REMAIN UPRIGHT FOR :30 (Patient not taking: Reported on 05/31/2021)   carbidopa-levodopa (  SINEMET IR) 25-100 MG tablet Take 1 tablet by mouth in the morning and at bedtime. (Patient not taking: Reported on 05/31/2021)   chlorpheniramine-HYDROcodone (TUSSIONEX PENNKINETIC ER) 10-8 MG/5ML SUER Take 5 mLs by mouth at bedtime as needed for cough. (Patient not taking: Reported on 05/31/2021)   clonazePAM (KLONOPIN) 0.5 MG tablet Take 0.5 mg by mouth at bedtime. 0.25 mg in the morning (Patient not taking: Reported on 05/31/2021)   EPINEPHrine 0.3 mg/0.3 mL IJ SOAJ  injection as needed. (Patient not taking: Reported on 05/31/2021)   terbinafine (LAMISIL) 250 MG tablet Take 2 tablets (500 mg total) by mouth daily. Take for 1 week out of every 4 weeks (Patient not taking: Reported on 05/31/2021)   zolpidem (AMBIEN) 5 MG tablet Take 1 tablet by mouth at bedtime. (Patient not taking: Reported on 05/31/2021)   No facility-administered encounter medications on file as of 05/31/2021.    Allergies (verified) Augmentin [amoxicillin-pot clavulanate], Droperidol, Paroxetine hcl, and Morphine   History: Past Medical History:  Diagnosis Date   Anxiety    Asthma    GERD (gastroesophageal reflux disease)    Headache    Migraines   Hypertension    Idiopathic thrombocytopenia purpura (HCC)    Required splenectomy   MVP (mitral valve prolapse)    Neuromuscular disorder (HCC)    Restless Leg Syndrome   Pancreatitis April 2017 [facility] due to cholecystitis.    Pneumonia    06-2015   Past Surgical History:  Procedure Laterality Date   ABDOMINAL HYSTERECTOMY     APPENDECTOMY     CARPAL TUNNEL RELEASE Bilateral 11/19/2014   Procedure: CARPAL TUNNEL RELEASE;  Surgeon: Donato Heinz, MD;  Location: [facility] ORS;  Service: Orthopedics;  Laterality: Bilateral;   CESAREAN SECTION     times 2   CHOLECYSTECTOMY  09/29/2015   [facility]   COLONOSCOPY N/A 05/02/2016   Procedure: COLONOSCOPY;  Surgeon: Scot Jun, MD;  Location: [facility] ENDOSCOPY;  Service: Endoscopy;  Laterality: N/A;   ESOPHAGOGASTRODUODENOSCOPY (EGD) WITH PROPOFOL N/A 05/02/2016   Procedure: ESOPHAGOGASTRODUODENOSCOPY (EGD) WITH PROPOFOL;  Surgeon: Scot Jun, MD;  Location: [facility] ENDOSCOPY;  Service: Endoscopy;  Laterality: N/A;   ESOPHAGOGASTRODUODENOSCOPY (EGD) WITH PROPOFOL N/A 11/22/2017   Procedure: ESOPHAGOGASTRODUODENOSCOPY (EGD) WITH PROPOFOL;  Surgeon: Scot Jun, MD;  Location: [facility] ENDOSCOPY;  Service: Endoscopy;  Laterality: N/A;   HERNIA REPAIR     times 3   JOINT REPLACEMENT      OVARIAN CYST SURGERY     SHOULDER ARTHROSCOPY WITH ROTATOR CUFF REPAIR     times 2   SPLENECTOMY, TOTAL     TOTAL SHOULDER ARTHROPLASTY Right 04/21/2017   Procedure: REVERSE SHOULDER ARTHROPLASTY;  Surgeon: Signa Kell, MD;  Location: [facility] ORS;  Service: Orthopedics;  Laterality: Right;   TRIGGER FINGER RELEASE Right 11/19/2014   Procedure: RELEASE TRIGGER FINGER/A-1 PULLEY;  Surgeon: Donato Heinz, MD;  Location: [facility] ORS;  Service: Orthopedics;  Laterality: Right;   TRIGGER FINGER RELEASE Right 08/12/2015   Procedure: RELEASE TRIGGER LONG FINGER;  Surgeon: Donato Heinz, MD;  Location: [facility] ORS;  Service: Orthopedics;  Laterality: Right;   Family History  Problem Relation Age of Onset   Hypertension Mother    Renal Disease Father        ESRD with HD   Social History   Socioeconomic History   Marital status: Married    Spouse name: Not on file   Number of children: 2   Years of education: Not on  file   Highest education level: Bachelor's degree (e.g., BA, AB, BS)  Occupational History   Occupation: Retired  Tobacco Use   Smoking status: Former    Packs/day: 1.00    Years: 10.00    Pack years: 10.00    Types: Cigarettes    Quit date: 11/10/1979    Years since quitting: 41.5   Smokeless tobacco: Never  Vaping Use   Vaping Use: Never used  Substance and Sexual Activity   Alcohol use: Not Currently    Alcohol/week: 0.0 standard drinks   Drug use: No   Sexual activity: Never  Other Topics Concern   Not on file  Social History Narrative   Not on file   Social Determinants of Health   Financial Resource Strain: Low Risk    Difficulty of Paying Living Expenses: Not hard at all  Food Insecurity: No Food Insecurity   Worried About Programme researcher, broadcasting/film/video in the Last Year: Never true   Ran Out of Food in the Last Year: Never true  Transportation Needs: No Transportation Needs   Lack of Transportation (Medical): No   Lack of Transportation (Non-Medical): No  Physical  Activity: Sufficiently Active   Days of Exercise per Week: 5 days   Minutes of Exercise per Session: 30 min  Stress: No Stress Concern Present   Feeling of Stress : Not at all  Social Connections: Moderately Integrated   Frequency of Communication with Friends and Family: More than three times a week   Frequency of Social Gatherings with Friends and Family: Three times a week   Attends Religious Services: More than 4 times per year   Active Member of Clubs or Organizations: No   Attends Banker Meetings: Never   Marital Status: Married    Tobacco Counseling Counseling given: Not Answered   Clinical Intake:  Pre-visit preparation completed: Yes  Pain : No/denies pain     Nutritional Risks: None Diabetes: No  How often do you need to have someone help you when you read instructions, pamphlets, or other written materials from your doctor or pharmacy?: 1 - Never  Diabetic?no  Interpreter Needed?: No  Information entered by :: Kennedy Bucker, LPN   Activities of Daily Living In your present state of health, do you have any difficulty performing the following activities: 05/31/2021  Hearing? N  Vision? N  Difficulty concentrating or making decisions? N  Walking or climbing stairs? N  Dressing or bathing? N  Doing errands, shopping? N  Preparing Food and eating ? N  Using the Toilet? N  In the past six months, have you accidently leaked urine? N  Do you have problems with loss of bowel control? N  Managing your Medications? N  Managing your Finances? N  Housekeeping or managing your Housekeeping? N  Some recent data might be hidden    Patient Care Team: Malva Limes, MD as PCP - General (Family Medicine) Eileen Stanford, MD as Referring Physician (Allergy and Immunology) Sallee Lange, MD as Consulting Physician (Ophthalmology) Mickle Mallory (Gastroenterology) Idelle Crouch, MD as Referring Physician (Neurology) Morene Crocker, MD  as Referring Physician (Neurology)  Indicate any recent Medical Services you may have received from other than [facility] providers in the past year (date may be approximate).     Assessment:   This is a routine wellness examination for [place].  Hearing/Vision screen No results found.  Dietary issues and exercise activities discussed: Current Exercise Habits: Home exercise routine, Type of  exercise: walking, Time (Minutes): 30, Frequency (Times/Week): 5, Weekly Exercise (Minutes/Week): 150, Intensity: Mild   Goals Addressed             This Visit's Progress    DIET - EAT MORE FRUITS AND VEGETABLES         Depression Screen PHQ 2/9 Scores 05/31/2021 05/31/2021 06/05/2020 03/16/2020 03/12/2019 08/13/2018 01/15/2018  PHQ - 2 Score 0 0 0 0 0 0 0    Fall Risk Fall Risk  05/31/2021 03/16/2020 03/12/2019 08/13/2018 01/15/2018  Falls in the past year? 0 0 0 0 No  Number falls in past yr: 0 0 0 - -  Injury with Fall? 0 0 0 - -  Risk for fall due to : No Fall Risks - - - -  Follow up Falls prevention discussed - - - -    FALL RISK PREVENTION PERTAINING TO THE HOME:  Any stairs in or around the home? Yes  If so, are there any without handrails? No  Home free of loose throw rugs in walkways, pet beds, electrical cords, etc? Yes  Adequate lighting in your home to reduce risk of falls? No   ASSISTIVE DEVICES UTILIZED TO PREVENT FALLS:  Life alert? No  Use of a cane, walker or w/c? No  Grab bars in the bathroom? No  Shower chair or bench in shower? Yes  Elevated toilet seat or a handicapped toilet? No   TIMED UP AND GO:  Was the test performed? No .  Cognitive Function: Normal cognitive status assessed by direct observation by this Nurse Health Advisor. No abnormalities found.       6CIT Screen 03/16/2020 03/12/2019 01/12/2017 01/12/2017  What Year? 0 points 0 points 0 points 0 points  What month? 0 points 0 points 0 points 0 points  What time? 0 points 0 points 0 points 0 points  Count  back from 20 0 points 0 points 0 points 0 points  Months in reverse 0 points 0 points 0 points -  Repeat phrase 0 points 0 points 2 points -  Total Score 0 0 2 -    Immunizations Immunization History  Administered Date(s) Administered   Fluad Quad(high Dose 65+) 03/29/2019, 06/03/2020   HiB (PRP-T) 10/19/2016   Meningococcal B, OMV 10/19/2016   Meningococcal Mcv4o 10/19/2016   PFIZER(Purple Top)SARS-COV-2 Vaccination 08/18/2019, 09/10/2019   Pneumococcal Conjugate-13 10/19/2016   Pneumococcal Polysaccharide-23 04/14/2009, 01/15/2018   Tdap 05/17/2011    TDAP status: Due, Education has been provided regarding the importance of this vaccine. Advised may receive this vaccine at local pharmacy or Health Dept. Aware to provide a copy of the vaccination record if obtained from local pharmacy or Health Dept. Verbalized acceptance and understanding.  Flu Vaccine status: Declined, Education has been provided regarding the importance of this vaccine but patient still declined. Advised may receive this vaccine at local pharmacy or Health Dept. Aware to provide a copy of the vaccination record if obtained from local pharmacy or Health Dept. Verbalized acceptance and understanding.  Pneumococcal vaccine status: Up to date  Covid-19 vaccine status: Completed vaccines  Qualifies for Shingles Vaccine? Yes   Zostavax completed No   Shingrix Completed?: No.    Education has been provided regarding the importance of this vaccine. Patient has been advised to call insurance company to determine out of pocket expense if they have not yet received this vaccine. Advised may also receive vaccine at local pharmacy or Health Dept. Verbalized acceptance and understanding.  Screening Tests Health Maintenance  Topic Date Due   Meningococcal B Vaccine (1 of 4 - Increased Risk Bexsero 2-dose series) 12/02/1960   Hepatitis C Screening  Never done   Zoster Vaccines- Shingrix (1 of 2) Never done   COVID-19 Vaccine  (3 - Pfizer risk series) 10/08/2019   INFLUENZA VACCINE  01/25/2021   MAMMOGRAM  04/29/2021   DEXA SCAN  04/29/2021   TETANUS/TDAP  05/16/2021   COLONOSCOPY (Pts 45-106yrs Insurance coverage will need to be confirmed)  05/02/2026   Pneumonia Vaccine 10+ Years old  Completed   HPV VACCINES  Aged Out    Health Maintenance  Health Maintenance Due  Topic Date Due   Meningococcal B Vaccine (1 of 4 - Increased Risk Bexsero 2-dose series) 12/02/1960   Hepatitis C Screening  Never done   Zoster Vaccines- Shingrix (1 of 2) Never done   COVID-19 Vaccine (3 - Pfizer risk series) 10/08/2019   INFLUENZA VACCINE  01/25/2021   MAMMOGRAM  04/29/2021   DEXA SCAN  04/29/2021   TETANUS/TDAP  05/16/2021    Colorectal cancer screening: Type of screening: Colonoscopy. Completed 05/02/16. Repeat every 10 years  Mammogram status: Completed 04/30/19. Repeat every year  Bone Density status: Completed 04/30/19. Results reflect: Bone density results: OSTEOPOROSIS. Repeat every 2 years.  Lung Cancer Screening: (Low Dose CT Chest recommended if Age 55-80 years, 30 pack-year currently smoking OR have quit w/in 15years.) does not qualify.   Additional Screening:  Hepatitis C Screening: does qualify; Completed no  Vision Screening: Recommended annual ophthalmology exams for early detection of glaucoma and other disorders of the eye. Is the patient up to date with their annual eye exam?  Yes  Who is the provider or what is the name of the office in which the patient attends annual eye exams? [facility] If pt is not established with a provider, would they like to be referred to a provider to establish care? No .   Dental Screening: Recommended annual dental exams for proper oral hygiene  Community Resource Referral / Chronic Care Management: CRR required this visit?  No   CCM required this visit?  No      Plan:     I have personally reviewed and noted the following in the patient's chart:    Medical and social history Use of alcohol, tobacco or illicit drugs  Current medications and supplements including opioid prescriptions.  Functional ability and status Nutritional status Physical activity Advanced directives List of other physicians Hospitalizations, surgeries, and ER visits in previous 12 months Vitals Screenings to include cognitive, depression, and falls Referrals and appointments  In addition, I have reviewed and discussed with patient certain preventive protocols, quality metrics, and best practice recommendations. A written personalized care plan for preventive services as well as general preventive health recommendations were provided to patient.     Hal Hope, LPN   16/06[birth year]958   Nurse Notes: none  I have reviewed the health advisor's note, was available for consultation, and agree with documentation and plan  [name], MD

## 2021-06-02 ENCOUNTER — Telehealth: Payer: Self-pay

## 2021-06-02 NOTE — Telephone Encounter (Signed)
Per Dr. Sherrie Mustache, ok to work patient in for CPE on 06/25/2021 at 1pm. Patient advised and accepted appointment time and date.

## 2021-06-02 NOTE — Telephone Encounter (Signed)
Copied from CRM [id number]. Topic: Appointment Scheduling - Scheduling Inquiry for Clinic >> Jun 02, 2021 11:09 AM Randol Kern wrote: Reason for CRM: Pt says she needs to be rescheduled, she has a paper that says she is scheduled for her CPE at 3:00 pm on December 16th. She will not be available the 19-21st. She says she needs this scheduled before the end of December because her insurance changes on January 1st.  Best contact: [phone]

## 2021-06-03 DIAGNOSIS — J301 Allergic rhinitis due to pollen: Secondary | ICD-10-CM | POA: Diagnosis not present

## 2021-06-03 DIAGNOSIS — J3089 Other allergic rhinitis: Secondary | ICD-10-CM | POA: Diagnosis not present

## 2021-06-03 DIAGNOSIS — J3081 Allergic rhinitis due to animal (cat) (dog) hair and dander: Secondary | ICD-10-CM | POA: Diagnosis not present

## 2021-06-08 DIAGNOSIS — J3081 Allergic rhinitis due to animal (cat) (dog) hair and dander: Secondary | ICD-10-CM | POA: Diagnosis not present

## 2021-06-08 DIAGNOSIS — J301 Allergic rhinitis due to pollen: Secondary | ICD-10-CM | POA: Diagnosis not present

## 2021-06-08 DIAGNOSIS — J3089 Other allergic rhinitis: Secondary | ICD-10-CM | POA: Diagnosis not present

## 2021-06-14 ENCOUNTER — Encounter: Payer: Medicare Other | Admitting: Family Medicine

## 2021-06-25 ENCOUNTER — Encounter: Payer: Self-pay | Admitting: Family Medicine

## 2021-06-25 ENCOUNTER — Ambulatory Visit (INDEPENDENT_AMBULATORY_CARE_PROVIDER_SITE_OTHER): Payer: Medicare Other | Admitting: Family Medicine

## 2021-06-25 ENCOUNTER — Other Ambulatory Visit: Payer: Self-pay

## 2021-06-25 VITALS — BP 138/65 | HR 56 | Temp 98.7°F | Resp 16 | Wt 131.0 lb

## 2021-06-25 DIAGNOSIS — I10 Essential (primary) hypertension: Secondary | ICD-10-CM

## 2021-06-25 DIAGNOSIS — Z0000 Encounter for general adult medical examination without abnormal findings: Secondary | ICD-10-CM

## 2021-06-25 DIAGNOSIS — Z23 Encounter for immunization: Secondary | ICD-10-CM | POA: Diagnosis not present

## 2021-06-25 DIAGNOSIS — M810 Age-related osteoporosis without current pathological fracture: Secondary | ICD-10-CM | POA: Diagnosis not present

## 2021-06-25 DIAGNOSIS — G43819 Other migraine, intractable, without status migrainosus: Secondary | ICD-10-CM

## 2021-06-25 MED ORDER — ALENDRONATE SODIUM 70 MG PO TABS: 70.0000 mg | ORAL_TABLET | ORAL | 4 refills | Status: DC

## 2021-06-25 MED ORDER — VITAMIN D (ERGOCALCIFEROL) 1.25 MG (50000 UNIT) PO CAPS: 50000.0000 [IU] | ORAL_CAPSULE | ORAL | 4 refills | Status: DC

## 2021-06-25 NOTE — Patient Instructions (Signed)
Please call the [facility] at [facility] at [phone] to schedule your mammogram.  

## 2021-06-25 NOTE — Progress Notes (Signed)
Complete physical exam   Patient: Heidi Hernandez   DOB: [birth year]   69 y.o. Female  MRN: [id number] Visit Date: 06/25/2021  Today's healthcare provider: Lelon Huh, MD   Chief Complaint  Patient presents with   Annual Exam   Hypertension   Subjective    Heidi Hernandez is a 70 y.o. female who presents today for a complete physical exam.  She reports consuming a general diet. Home exercise routine includes walking 2 miles daily. She generally feels fairly well. She reports sleeping fairly well. She does not have additional problems to discuss today.  Had AWV with [facility] on 05/31/2021.  HPI  Hypertension, follow-up  BP Readings from Last 3 Encounters:  06/25/21 138/65  10/29/20 118/64  06/21/20 (!) 167/84   Wt Readings from Last 3 Encounters:  06/25/21 131 lb (59.4 kg)  06/21/20 145 lb (65.8 kg)  06/03/20 152 lb (68.9 kg)     She was last seen for hypertension 1  year  ago.  BP at that visit was 144/74. Management since that visit includes reducing atenolol to 1/2 daily and increasing amlodipine to 5mg  daily.  She reports good compliance with treatment. She is not having side effects.  She is following a Regular diet. She is exercising. She does not smoke.  Use of agents associated with hypertension: none.   Outside blood pressures are averaging 140/80. Symptoms: No chest pain No chest pressure  No palpitations No syncope  No dyspnea No orthopnea  No paroxysmal nocturnal dyspnea No lower extremity edema   Pertinent labs: Lab Results  Component Value Date   CHOL 155 01/15/2018   HDL 60 01/15/2018   LDLCALC 75 01/15/2018   TRIG 100 01/15/2018   CHOLHDL 2.6 01/15/2018   Lab Results  Component Value Date   NA 139 04/28/2021   K 3.7 04/28/2021   CREATININE 0.63 04/28/2021   GFRNONAA >60 04/28/2021   GLUCOSE 82 04/28/2021   TSH 1.740 06/04/2020     The ASCVD Risk score (Arnett DK, et al., 2019) failed to calculate for the following reasons:    Cannot find a previous HDL lab   Cannot find a previous total cholesterol lab   ---------------------------------------------------------------------------------------------------   Follow up for osteoporosis:  The patient was last seen for this 1  year  ago. Changes made at last visit include putting Alendronate on hold for the next few months.  She reports good compliance with treatment. She feels that condition is Unchanged. She is not having side effects.   -----------------------------------------------------------------------------------------   Vitamin D deficiency, follow-up  Lab Results  Component Value Date   VD25OH 29.5 (L) 06/04/2020   VD25OH 31.1 04/01/2019   VD25OH 30.6 01/15/2018   CALCIUM 8.6 (L) 04/28/2021   CALCIUM 9.0 10/26/2020        Wt Readings from Last 3 Encounters:  06/25/21 131 lb (59.4 kg)  06/21/20 145 lb (65.8 kg)  06/03/20 152 lb (68.9 kg)    She was last seen for vitamin D deficiency 1  year  ago.  Management since that visit includes encouraging patient to take Vitamin D supplement every day. She reports poor compliance with treatment. Has been out of Vitamin D supplements for several months She is not having side effects.   Symptoms: No change in energy level No numbness or tingling  No bone pain No unexplained fracture   ---------------------------------------------------------------------------------------------------   Past Medical History:  Diagnosis Date   Anxiety    Asthma  GERD (gastroesophageal reflux disease)    Headache    Migraines   Hypertension    Idiopathic thrombocytopenia purpura (HCC)    Required splenectomy   MVP (mitral valve prolapse)    Neuromuscular disorder (HCC)    Restless Leg Syndrome   [facility]   [place] due to cholecystitis.    Pneumonia    06-2015   Past Surgical History:  Procedure Laterality Date   ABDOMINAL HYSTERECTOMY     APPENDECTOMY     CARPAL TUNNEL RELEASE  Bilateral 11/19/2014   Procedure: CARPAL TUNNEL RELEASE;  Surgeon: Donato HeinzJames P Hooten, MD;  Location: [facility] ORS;  Service: Orthopedics;  Laterality: Bilateral;   CESAREAN SECTION     times 2   CHOLECYSTECTOMY  09/29/2015   [facility]   COLONOSCOPY N/A 05/02/2016   Procedure: COLONOSCOPY;  Surgeon: Scot Junobert T Elliott, MD;  [facility];  Service: Endoscopy;  Laterality: N/A;   ESOPHAGOGASTRODUODENOSCOPY (EGD) WITH PROPOFOL N/A 05/02/2016   Procedure: ESOPHAGOGASTRODUODENOSCOPY (EGD) WITH PROPOFOL;  Surgeon: Scot Junobert T Elliott, MD;  [facility];  Service: Endoscopy;  Laterality: N/A;   ESOPHAGOGASTRODUODENOSCOPY (EGD) WITH PROPOFOL N/A 11/22/2017   Procedure: ESOPHAGOGASTRODUODENOSCOPY (EGD) WITH PROPOFOL;  Surgeon: Scot JunElliott, Robert T, MD;  [facility];  Service: Endoscopy;  Laterality: N/A;   HERNIA REPAIR     times 3   JOINT REPLACEMENT     OVARIAN CYST SURGERY     SHOULDER ARTHROSCOPY WITH ROTATOR CUFF REPAIR     times 2   SPLENECTOMY, TOTAL     TOTAL SHOULDER ARTHROPLASTY Right 04/21/2017   Procedure: REVERSE SHOULDER ARTHROPLASTY;  Surgeon: Signa KellPatel, Sunny, MD;  Location: [facility] ORS;  Service: Orthopedics;  Laterality: Right;   TRIGGER FINGER RELEASE Right 11/19/2014   Procedure: RELEASE TRIGGER FINGER/A-1 PULLEY;  Surgeon: Donato HeinzJames P Hooten, MD;  Location: [facility] ORS;  Service: Orthopedics;  Laterality: Right;   TRIGGER FINGER RELEASE Right 08/12/2015   Procedure: RELEASE TRIGGER LONG FINGER;  Surgeon: Donato HeinzJames P Hooten, MD;  Location: [facility] ORS;  Service: Orthopedics;  Laterality: Right;   Social History   Socioeconomic History   Marital status: Married    Spouse name: Not on file   Number of children: 2   Years of education: Not on file   Highest education level: Bachelor's degree (e.g., BA, AB, BS)  Occupational History   Occupation: Retired  Tobacco Use   Smoking status: Former    Packs/day: 1.00    Years: 10.00    Pack years: 10.00    Types: Cigarettes    Quit  date: 11/10/1979    Years since quitting: 41.6   Smokeless tobacco: Never  Vaping Use   Vaping Use: Never used  Substance and Sexual Activity   Alcohol use: Not Currently    Alcohol/week: 0.0 standard drinks   Drug use: No   Sexual activity: Never  Other Topics Concern   Not on file  Social History Narrative   Not on file   Social Determinants of Health   Financial Resource Strain: Low Risk    Difficulty of Paying Living Expenses: Not hard at all  Food Insecurity: No Food Insecurity   Worried About Programme researcher, broadcasting/film/videounning Out of Food in the Last Year: Never true   Ran Out of Food in the Last Year: Never true  Transportation Needs: No Transportation Needs   Lack of Transportation (Medical): No   Lack of Transportation (Non-Medical): No  Physical Activity: Sufficiently Active   Days of Exercise per Week: 5 days  Minutes of Exercise per Session: 30 min  Stress: No Stress Concern Present   Feeling of Stress : Not at all  Social Connections: Moderately Integrated   Frequency of Communication with Friends and Family: More than three times a week   Frequency of Social Gatherings with Friends and Family: Three times a week   Attends Religious Services: More than 4 times per year   Active Member of Clubs or Organizations: No   Attends Archivist Meetings: Never   Marital Status: Married  Human resources officer Violence: Not At Risk   Fear of Current or Ex-Partner: No   Emotionally Abused: No   Physically Abused: No   Sexually Abused: No   Family Status  Relation Name Status   Mother  Deceased at age 34       aortic aneurysm   Father  Deceased at age 30       Prostate Cancer   Brother  Alive       ITP   Brother  Alive       ITP   Family History  Problem Relation Age of Onset   Hypertension Mother    Renal Disease Father        ESRD with HD   Allergies  Allergen Reactions   Augmentin [Amoxicillin-Pot Clavulanate] Nausea And Vomiting   Droperidol Other (See Comments)     "Locked jaw" Facial muscles locked "Locked jaw"   Paroxetine Hcl Other (See Comments)    syncope Passed out    Morphine Itching    Patient Care Team: Birdie Sons, MD as PCP - General (Family Medicine) Tiajuana Amass, MD as Referring Physician (Allergy and Immunology) Estill Cotta, MD as Consulting Physician (Ophthalmology) Reeves Forth (Gastroenterology) Kandy Garrison, MD as Referring Physician (Neurology) Anabel Bene, MD as Referring Physician (Neurology)   Medications: Outpatient Medications Prior to Visit  Medication Sig   albuterol (VENTOLIN HFA) 108 (90 Base) MCG/ACT inhaler Inhale 2 puffs into the lungs every 6 (six) hours as needed for wheezing. May substitute generic or equivalent brand name   albuterol (VENTOLIN HFA) 108 (90 Base) MCG/ACT inhaler Inhale 2 puffs into the lungs every 6 (six) hours as needed for wheezing or shortness of breath.   amLODipine (NORVASC) 5 MG tablet TAKE 1 TABLET BY MOUTH ONCE DAILY   atenolol (TENORMIN) 50 MG tablet TAKE 1 TABLET BY MOUTH ONCE DAILY (Patient taking differently: Take 25 mg by mouth.)   azelastine (ASTELIN) 0.1 % nasal spray Place 2 sprays into both nostrils daily.    EPINEPHrine 0.3 mg/0.3 mL IJ SOAJ injection as needed.   Fexofenadine HCl (ALLEGRA PO) Take 1 capsule by mouth daily.   fluticasone (FLONASE) 50 MCG/ACT nasal spray Place 2 sprays into both nostrils daily.    fluticasone (FLOVENT HFA) 44 MCG/ACT inhaler Inhale 1 puff into the lungs daily.   methadone (DOLOPHINE) 5 MG tablet Take 1/2 tab (2.5 mg) daily for RLS.   montelukast (SINGULAIR) 10 MG tablet Take 10 mg by mouth at bedtime.    omeprazole (PRILOSEC) 40 MG capsule Take 40 mg by mouth daily.   ondansetron (ZOFRAN ODT) 4 MG disintegrating tablet Take 1 tablet (4 mg total) by mouth every 8 (eight) hours as needed for nausea or vomiting.   pramipexole (MIRAPEX) 0.5 MG tablet Take 1 tablet by mouth 6 (six) times daily.   terbinafine (LAMISIL)  250 MG tablet Take 2 tablets (500 mg total) by mouth daily. Take for 1 week out of every  4 weeks   [DISCONTINUED] chlorpheniramine-HYDROcodone (TUSSIONEX PENNKINETIC ER) 10-8 MG/5ML SUER Take 5 mLs by mouth at bedtime as needed for cough.   [DISCONTINUED] SUMAtriptan (IMITREX) 100 MG tablet TAKE 1 TABLET BY MOUTH AT ONSET OF HEADACHE. MAY TAKE AN ADDITIONAL TABLET IN 2 HOURS IF NEEDED. MAX OF 200mg /24hr   [DISCONTINUED] Vitamin D, Ergocalciferol, (DRISDOL) 1.25 MG (50000 UNIT) CAPS capsule TAKE 1 CAPSULE BY MOUTH ONCE A WEEK   [DISCONTINUED] alendronate (FOSAMAX) 70 MG tablet TAKE ONE TABLET BY MOUTH EACH WEEK, ON AN EMPTY STOMACH BEFORE BREAKFAST WITH 8oz OF WATER AND REMAIN UPRIGHT FOR :30 (Patient not taking: Reported on 06/25/2021)   [DISCONTINUED] carbidopa-levodopa (SINEMET IR) 25-100 MG tablet Take 1 tablet by mouth in the morning and at bedtime. (Patient not taking: Reported on 05/31/2021)   [DISCONTINUED] clonazePAM (KLONOPIN) 0.5 MG tablet Take 0.5 mg by mouth at bedtime. 0.25 mg in the morning (Patient not taking: Reported on 05/31/2021)   [DISCONTINUED] zolpidem (AMBIEN) 5 MG tablet Take 1 tablet by mouth at bedtime. (Patient not taking: Reported on 05/31/2021)   No facility-administered medications prior to visit.    Review of Systems  Constitutional:  Negative for chills, fatigue and fever.  HENT:  Negative for congestion, ear pain, rhinorrhea, sneezing and sore throat.   Eyes: Negative.  Negative for pain and redness.  Respiratory:  Negative for cough, shortness of breath and wheezing.   Cardiovascular:  Negative for chest pain and leg swelling.  Gastrointestinal:  Negative for abdominal pain, blood in stool, constipation, diarrhea and nausea.  Endocrine: Negative for polydipsia and polyphagia.  Genitourinary: Negative.  Negative for dysuria, flank pain, hematuria, pelvic pain, vaginal bleeding and vaginal discharge.  Musculoskeletal:  Negative for arthralgias, back pain, gait  problem and joint swelling.  Skin:  Negative for rash.  Neurological: Negative.  Negative for dizziness, tremors, seizures, weakness, light-headedness, numbness and headaches.  Hematological:  Negative for adenopathy.  Psychiatric/Behavioral: Negative.  Negative for behavioral problems, confusion and dysphoric mood. The patient is not nervous/anxious and is not hyperactive.      Objective    BP 138/65 (BP Location: Left Arm, Patient Position: Sitting, Cuff Size: Normal)    Pulse (!) 56    Temp 98.7 F (37.1 C) (Oral)    Resp 16    Wt 131 lb (59.4 kg)    SpO2 99% Comment: room air   BMI 22.49 kg/m    Physical Exam   General Appearance:    Well developed, well nourished female. Alert, cooperative, in no acute distress, appears stated age   Head:    Normocephalic, without obvious abnormality, atraumatic  Eyes:    PERRL, conjunctiva/corneas clear, EOM's intact, fundi    benign, both eyes  Ears:    Normal TM's and external ear canals, both ears  Neck:   Supple, symmetrical, trachea midline, no adenopathy;    thyroid:  no enlargement/tenderness/nodules; no carotid   bruit or JVD  Back:     Symmetric, no curvature, ROM normal, no CVA tenderness  Lungs:     Clear to auscultation bilaterally, respirations unlabored  Chest Wall:    No tenderness or deformity   Heart:    Bradycardic. Normal rhythm.  1/6 systolic murmur at right upper sternal border  Breast Exam:    normal appearance, no masses or tenderness, deferred  Abdomen:     Soft, non-tender, bowel sounds active all four quadrants,    no masses, no organomegaly  Pelvic:    deferred  Extremities:  All extremities are intact. No cyanosis or edema  Pulses:   2+ and symmetric all extremities  Skin:   Skin color, texture, turgor normal, no rashes or lesions  Lymph nodes:   Cervical, supraclavicular, and axillary nodes normal  Neurologic:   CNII-XII intact, normal strength, sensation and reflexes    throughout     Last depression  screening scores PHQ 2/9 Scores 05/31/2021 05/31/2021 06/05/2020  PHQ - 2 Score 0 0 0   Last fall risk screening Fall Risk  05/31/2021  Falls in the past year? 0  Number falls in past yr: 0  Injury with Fall? 0  Risk for fall due to : No Fall Risks  Follow up Falls prevention discussed   Last Audit-C alcohol use screening Alcohol Use Disorder Test (AUDIT) 05/31/2021  1. How often do you have a drink containing alcohol? 0  2. How many drinks containing alcohol do you have on a typical day when you are drinking? 0  3. How often do you have six or more drinks on one occasion? 0  AUDIT-C Score 0  Alcohol Brief Interventions/Follow-up -   A score of 3 or more in women, and 4 or more in men indicates increased risk for alcohol abuse, EXCEPT if all of the points are from question 1   No results found for any visits on 06/25/21.  Assessment & Plan    Routine Health Maintenance and Physical Exam  Exercise Activities and Dietary recommendations  Goals      DIET - EAT MORE FRUITS AND VEGETABLES     DIET - INCREASE WATER INTAKE     Recommend increasing water intake to 4 glasses a day.         Immunization History  Administered Date(s) Administered   Fluad Quad(high Dose 65+) 03/29/2019, 06/03/2020, 06/25/2021   HiB (PRP-T) 10/19/2016   Meningococcal B, OMV 10/19/2016   Meningococcal Mcv4o 10/19/2016   PFIZER(Purple Top)SARS-COV-2 Vaccination 08/18/2019, 09/10/2019   Pneumococcal Conjugate-13 10/19/2016   Pneumococcal Polysaccharide-23 04/14/2009, 01/15/2018   Tdap 05/17/2011    Health Maintenance  Topic Date Due   Meningococcal B Vaccine (1 of 4 - Increased Risk Bexsero 2-dose series) 12/02/1960   Hepatitis C Screening  Never done   Zoster Vaccines- Shingrix (1 of 2) Never done   COVID-19 Vaccine (4 - Booster for Pfizer series) 08/11/2020   MAMMOGRAM  04/29/2021   DEXA SCAN  04/29/2021   TETANUS/TDAP  05/16/2021   COLONOSCOPY (Pts 45-5yrs Insurance coverage will need to be  confirmed)  05/02/2026   Pneumonia Vaccine 31+ Years old  Completed   INFLUENZA VACCINE  Completed   HPV VACCINES  Aged Out    Discussed health benefits of physical activity, and encouraged her to engage in regular exercise appropriate for her age and condition.  1. Need for influenza vaccination  - Flu Vaccine QUAD High Dose IM (Fluad)   2 Osteoporosis without current pathological fracture, unspecified osteoporosis type refill alendronate (FOSAMAX) 70 MG tablet; Take 1 tablet (70 mg total) by mouth once a week. Take with a full glass of water on an empty stomach.  Dispense: 12 tablet; Refill: 4  refill Vitamin D, Ergocalciferol, (DRISDOL) 1.25 MG (50000 UNIT) CAPS capsule; Take 1 capsule (50,000 Units total) by mouth once a week.  Dispense: 12 capsule; Refill: 4   3. Hypertension Doing well on current medication regiment. SBP remains borderline. Consider increasing amlodipine.   Refill Imitrex, which she takes rarely for migraines.    Future Appointments  Date Time Provider Department Center  07/22/2021  2:20 PM [facility] 1 [facility]  07/22/2021  3:00 PM [facility] 2 [facility]  09/22/2021  9:40 AM Malva Limes, MD [facility]  10/27/2021  1:15 PM [facility]MO LAB [facility] None  10/28/2021  2:00 PM Jeralyn Ruths, MD [facility] None  10/28/2021  2:30 PM [facility]FLUID CLINIC [facility] None  06/01/2022  9:40 AM [facility]-NURSE HEALTH ADVISOR [facility]       The entirety of the information documented in the History of Present Illness, Review of Systems and Physical Exam were personally obtained by me. Portions of this information were initially documented by the CMA and reviewed by me for thoroughness and accuracy.     [name], MD  [facility] [phone] (phone) [phone] (fax)  [facility]

## 2021-06-29 DIAGNOSIS — L299 Pruritus, unspecified: Secondary | ICD-10-CM | POA: Diagnosis not present

## 2021-06-29 DIAGNOSIS — J3081 Allergic rhinitis due to animal (cat) (dog) hair and dander: Secondary | ICD-10-CM | POA: Diagnosis not present

## 2021-06-29 DIAGNOSIS — J301 Allergic rhinitis due to pollen: Secondary | ICD-10-CM | POA: Diagnosis not present

## 2021-06-29 DIAGNOSIS — J4530 Mild persistent asthma, uncomplicated: Secondary | ICD-10-CM | POA: Diagnosis not present

## 2021-06-29 DIAGNOSIS — J3089 Other allergic rhinitis: Secondary | ICD-10-CM | POA: Diagnosis not present

## 2021-06-30 MED ORDER — SUMATRIPTAN SUCCINATE 100 MG PO TABS: ORAL_TABLET | ORAL | 2 refills | Status: DC

## 2021-07-08 DIAGNOSIS — J3089 Other allergic rhinitis: Secondary | ICD-10-CM | POA: Diagnosis not present

## 2021-07-08 DIAGNOSIS — J301 Allergic rhinitis due to pollen: Secondary | ICD-10-CM | POA: Diagnosis not present

## 2021-07-08 DIAGNOSIS — J3081 Allergic rhinitis due to animal (cat) (dog) hair and dander: Secondary | ICD-10-CM | POA: Diagnosis not present

## 2021-07-15 ENCOUNTER — Encounter: Payer: Self-pay | Admitting: Oncology

## 2021-07-16 DIAGNOSIS — J4530 Mild persistent asthma, uncomplicated: Secondary | ICD-10-CM | POA: Diagnosis not present

## 2021-07-16 DIAGNOSIS — J301 Allergic rhinitis due to pollen: Secondary | ICD-10-CM | POA: Diagnosis not present

## 2021-07-16 DIAGNOSIS — L299 Pruritus, unspecified: Secondary | ICD-10-CM | POA: Diagnosis not present

## 2021-07-16 DIAGNOSIS — J3081 Allergic rhinitis due to animal (cat) (dog) hair and dander: Secondary | ICD-10-CM | POA: Diagnosis not present

## 2021-07-16 DIAGNOSIS — J3089 Other allergic rhinitis: Secondary | ICD-10-CM | POA: Diagnosis not present

## 2021-07-19 ENCOUNTER — Ambulatory Visit: Payer: Self-pay | Admitting: *Deleted

## 2021-07-19 ENCOUNTER — Other Ambulatory Visit: Payer: Self-pay | Admitting: Family Medicine

## 2021-07-19 DIAGNOSIS — I10 Essential (primary) hypertension: Secondary | ICD-10-CM

## 2021-07-19 MED ORDER — AMLODIPINE BESYLATE 10 MG PO TABS: 10.0000 mg | ORAL_TABLET | Freq: Every day | ORAL | 3 refills | Status: DC

## 2021-07-19 NOTE — Telephone Encounter (Signed)
Requested medication (s) are due for refill today: yes  Requested medication (s) are on the active medication list: yes  Last refill:  08/04/20  Future visit scheduled: 09/22/21  Notes to clinic: Med list notes that pt is taking 25mg  daily, not 50mg  as is ordered, please assess. Requested Prescriptions  Pending Prescriptions Disp Refills   atenolol (TENORMIN) 50 MG tablet 90 tablet 1    Sig: Take 1 tablet (50 mg total) by mouth daily.     Cardiovascular:  Beta Blockers Passed - 07/19/2021  2:02 PM      Passed - Last BP in normal range    BP Readings from Last 1 Encounters:  06/25/21 138/65          Passed - Last Heart Rate in normal range    Pulse Readings from Last 1 Encounters:  06/25/21 (!) 56          Passed - Valid encounter within last 6 months    Recent Outpatient Visits           3 weeks ago Need for influenza vaccination   [facility] 06/27/21, MD   1 year ago Cough   [facility] Malva Limes, MD   1 year ago Need for influenza vaccination   [facility] Malva Limes, MD   1 year ago Bronchitis   [facility] Malva Limes M, Osvaldo Angst   2 years ago Restless legs syndrome   [facility], MD       Future Appointments             In 2 months Fisher, [facility], MD [facility], [facility]

## 2021-07-19 NOTE — Telephone Encounter (Signed)
Tried calling patient. Left message to call back. Ok for [facility] triage to advise.  

## 2021-07-19 NOTE — Telephone Encounter (Signed)
Have sent prescription for 10mg  tablets to , so she only needs to take 1 tablet daily

## 2021-07-19 NOTE — Addendum Note (Signed)
Addended by: Birdie Sons on: 07/19/2021 04:41 PM   Modules accepted: Orders

## 2021-07-19 NOTE — Telephone Encounter (Signed)
Medication Refill - Medication: amLODipine (NORVASC) 5 MG tablet  atenolol (TENORMIN) 50 MG tablet  Has the patient contacted their pharmacy? Yes.   (Agent: If no, request that the patient contact the pharmacy for the refill. If patient does not wish to contact the pharmacy document the reason why and proceed with request.) (Agent: If yes, when and what did the pharmacy advise?)  Preferred Pharmacy (with phone number or street name):  [id number] [address], [place] - [address]  [address] [place] [address]  Phone: [phone] Fax: [phone]   Has the patient been seen for an appointment in the last year OR does the patient have an upcoming appointment? Yes.    Agent: Please be advised that RX refills may take up to 3 business days. We ask that you follow-up with your pharmacy.

## 2021-07-19 NOTE — Telephone Encounter (Signed)
Patient is completely out of Amlodipine.  Chief Complaint: high blood pressure Symptoms: Headache on/off last 3 days only Frequency: on and off over the last 3 days. Pertinent Negatives: Patient denies CP/SOB/Dizziness/Blurred vision Disposition: [] ED /[] Urgent Care (no appt availability in office) / [] Appointment(In office/virtual)/ []  [facility]/ [] Home Care/ [] Refused Recommended Disposition /[] [facility]/ [x]  Follow-up with PCP Additional Notes: Patient stated that at 12/30 appointment, physician said to take 2 amlodipine 5 mgs tabs daily for her borderline B/P. Patient medication record reads take one daily. Routing to physician for review and possible updated prescription to be called in to .     Reason for Disposition  Systolic BP  >= 180 OR Diastolic >= 110  Answer Assessment - Initial Assessment Questions 1. BLOOD PRESSURE: "What is the blood pressure?" "Did you take at least two measurements 5 minutes apart?"     212/97 now 197/90 HR now  2. ONSET: "When did you take your blood pressure?"     Home  3. HOW: "How did you obtain the blood pressure?" (e.g., visiting nurse, automatic home BP monitor)     home 4. HISTORY: "Do you have a history of high blood pressure?"     yes 5. MEDICATIONS: "Are you taking any medications for blood pressure?" "Have you missed any doses recently?"     Yes, but out of medication for today. 6. OTHER SYMPTOMS: "Do you have any symptoms?" (e.g., headache, chest pain, blurred vision, difficulty breathing, weakness)     Headache only on/off for for the last 3 days.  7. PREGNANCY: "Is there any chance you are pregnant?" "When was your last menstrual period?"     NA  Protocols used: Blood Pressure - High-A-AH

## 2021-07-20 MED ORDER — ATENOLOL 50 MG PO TABS: 25.0000 mg | ORAL_TABLET | Freq: Every day | ORAL | 1 refills | Status: DC

## 2021-07-20 NOTE — Telephone Encounter (Signed)
Patient called and advised of the message below from Dr. Sherrie Mustache, she verbalized understanding and says she has the Amlodipine. She asked about Atenolol. I advised a new Rx was sent in today, instructions given, she verbalized understanding.

## 2021-07-22 ENCOUNTER — Ambulatory Visit
Admission: RE | Admit: 2021-07-22 | Discharge: 2021-07-22 | Disposition: A | Discharge status: Home / Self Care | Payer: Medicare HMO | Source: Ambulatory Visit | Attending: Family Medicine | Admitting: Family Medicine

## 2021-07-22 ENCOUNTER — Other Ambulatory Visit: Payer: Self-pay

## 2021-07-22 ENCOUNTER — Encounter: Payer: Self-pay | Admitting: Oncology

## 2021-07-22 DIAGNOSIS — Z1231 Encounter for screening mammogram for malignant neoplasm of breast: Secondary | ICD-10-CM | POA: Diagnosis not present

## 2021-07-22 DIAGNOSIS — M8589 Other specified disorders of bone density and structure, multiple sites: Secondary | ICD-10-CM | POA: Diagnosis not present

## 2021-07-22 DIAGNOSIS — Z780 Asymptomatic menopausal state: Secondary | ICD-10-CM | POA: Insufficient documentation

## 2021-07-22 DIAGNOSIS — M810 Age-related osteoporosis without current pathological fracture: Secondary | ICD-10-CM | POA: Diagnosis not present

## 2021-07-23 ENCOUNTER — Other Ambulatory Visit: Payer: Self-pay | Admitting: Family Medicine

## 2021-07-23 DIAGNOSIS — R928 Other abnormal and inconclusive findings on diagnostic imaging of breast: Secondary | ICD-10-CM

## 2021-07-26 DIAGNOSIS — M65321 Trigger finger, right index finger: Secondary | ICD-10-CM | POA: Diagnosis not present

## 2021-07-26 DIAGNOSIS — M65342 Trigger finger, left ring finger: Secondary | ICD-10-CM | POA: Diagnosis not present

## 2021-07-27 ENCOUNTER — Telehealth: Payer: Self-pay

## 2021-07-27 NOTE — Telephone Encounter (Signed)
Mammogram incomplete due to need for additional views.  [facility] should be calling her to schedule additional views.  LMTCB, [place] Triage Nurse may give patient results     Copied from [place] [phone]. Topic: General - Other >> Jul 27, 2021 10:10 AM Yvette Rack wrote: Reason for CRM: Pt stated there is a letter regarding mammogram listed on her [facility] and she would like to know if it has been reviewed by Dr. Caryn Section. Pt also would like to know what the next steps are. Pt requests call back. Cb# [phone]

## 2021-07-27 NOTE — Telephone Encounter (Signed)
Patient called and advised as below, she verbalized understanding and says she already called [facility] and they will be calling back to schedule.

## 2021-07-27 NOTE — Telephone Encounter (Signed)
Pt was given message   Mammogram incomplete due to need for additional views.  [facility] should be calling her to schedule additional views.   LMTCB, [facility] Triage Nurse may give patient results         Copied from CRM [phone]. Topic: General - Other >> Jul 27, 2021 10:10 AM Marylen Ponto wrote: Reason for CRM: Pt stated there is a letter regarding mammogram listed on her [facility] and she would like to know if it has been reviewed by Dr. Sherrie Mustache. Pt also would like to know what the next steps are. Pt requests call back. Cb# [phone]

## 2021-07-28 ENCOUNTER — Other Ambulatory Visit: Payer: Self-pay

## 2021-07-28 ENCOUNTER — Ambulatory Visit
Admission: RE | Admit: 2021-07-28 | Discharge: 2021-07-28 | Disposition: A | Discharge status: Home / Self Care | Payer: Medicare HMO | Source: Ambulatory Visit | Attending: Family Medicine | Admitting: Family Medicine

## 2021-07-28 DIAGNOSIS — R922 Inconclusive mammogram: Secondary | ICD-10-CM | POA: Diagnosis not present

## 2021-07-28 DIAGNOSIS — R928 Other abnormal and inconclusive findings on diagnostic imaging of breast: Secondary | ICD-10-CM | POA: Insufficient documentation

## 2021-08-03 DIAGNOSIS — J3081 Allergic rhinitis due to animal (cat) (dog) hair and dander: Secondary | ICD-10-CM | POA: Diagnosis not present

## 2021-08-03 DIAGNOSIS — J3089 Other allergic rhinitis: Secondary | ICD-10-CM | POA: Diagnosis not present

## 2021-08-03 DIAGNOSIS — J301 Allergic rhinitis due to pollen: Secondary | ICD-10-CM | POA: Diagnosis not present

## 2021-08-12 DIAGNOSIS — J301 Allergic rhinitis due to pollen: Secondary | ICD-10-CM | POA: Diagnosis not present

## 2021-08-12 DIAGNOSIS — J3081 Allergic rhinitis due to animal (cat) (dog) hair and dander: Secondary | ICD-10-CM | POA: Diagnosis not present

## 2021-08-12 DIAGNOSIS — J3089 Other allergic rhinitis: Secondary | ICD-10-CM | POA: Diagnosis not present

## 2021-08-19 DIAGNOSIS — J301 Allergic rhinitis due to pollen: Secondary | ICD-10-CM | POA: Diagnosis not present

## 2021-08-19 DIAGNOSIS — J3081 Allergic rhinitis due to animal (cat) (dog) hair and dander: Secondary | ICD-10-CM | POA: Diagnosis not present

## 2021-08-19 DIAGNOSIS — J3089 Other allergic rhinitis: Secondary | ICD-10-CM | POA: Diagnosis not present

## 2021-08-24 DIAGNOSIS — J301 Allergic rhinitis due to pollen: Secondary | ICD-10-CM | POA: Diagnosis not present

## 2021-08-24 DIAGNOSIS — J3081 Allergic rhinitis due to animal (cat) (dog) hair and dander: Secondary | ICD-10-CM | POA: Diagnosis not present

## 2021-08-24 DIAGNOSIS — J3089 Other allergic rhinitis: Secondary | ICD-10-CM | POA: Diagnosis not present

## 2021-09-03 DIAGNOSIS — J301 Allergic rhinitis due to pollen: Secondary | ICD-10-CM | POA: Diagnosis not present

## 2021-09-03 DIAGNOSIS — J3089 Other allergic rhinitis: Secondary | ICD-10-CM | POA: Diagnosis not present

## 2021-09-03 DIAGNOSIS — J3081 Allergic rhinitis due to animal (cat) (dog) hair and dander: Secondary | ICD-10-CM | POA: Diagnosis not present

## 2021-09-07 DIAGNOSIS — J3081 Allergic rhinitis due to animal (cat) (dog) hair and dander: Secondary | ICD-10-CM | POA: Diagnosis not present

## 2021-09-07 DIAGNOSIS — J3089 Other allergic rhinitis: Secondary | ICD-10-CM | POA: Diagnosis not present

## 2021-09-07 DIAGNOSIS — J301 Allergic rhinitis due to pollen: Secondary | ICD-10-CM | POA: Diagnosis not present

## 2021-09-09 DIAGNOSIS — Z79899 Other long term (current) drug therapy: Secondary | ICD-10-CM | POA: Diagnosis not present

## 2021-09-09 DIAGNOSIS — K5900 Constipation, unspecified: Secondary | ICD-10-CM | POA: Diagnosis not present

## 2021-09-09 DIAGNOSIS — G2581 Restless legs syndrome: Secondary | ICD-10-CM | POA: Diagnosis not present

## 2021-09-14 DIAGNOSIS — J301 Allergic rhinitis due to pollen: Secondary | ICD-10-CM | POA: Diagnosis not present

## 2021-09-14 DIAGNOSIS — J3089 Other allergic rhinitis: Secondary | ICD-10-CM | POA: Diagnosis not present

## 2021-09-15 ENCOUNTER — Encounter: Payer: Self-pay | Admitting: Oncology

## 2021-09-20 DIAGNOSIS — M65342 Trigger finger, left ring finger: Secondary | ICD-10-CM | POA: Diagnosis not present

## 2021-09-21 DIAGNOSIS — J301 Allergic rhinitis due to pollen: Secondary | ICD-10-CM | POA: Diagnosis not present

## 2021-09-21 DIAGNOSIS — J3089 Other allergic rhinitis: Secondary | ICD-10-CM | POA: Diagnosis not present

## 2021-09-21 DIAGNOSIS — J3081 Allergic rhinitis due to animal (cat) (dog) hair and dander: Secondary | ICD-10-CM | POA: Diagnosis not present

## 2021-09-22 ENCOUNTER — Encounter: Payer: Self-pay | Admitting: Oncology

## 2021-09-22 ENCOUNTER — Ambulatory Visit (INDEPENDENT_AMBULATORY_CARE_PROVIDER_SITE_OTHER): Payer: Medicare HMO | Admitting: Family Medicine

## 2021-09-22 ENCOUNTER — Other Ambulatory Visit: Payer: Self-pay

## 2021-09-22 ENCOUNTER — Encounter: Payer: Self-pay | Admitting: Family Medicine

## 2021-09-22 DIAGNOSIS — G2581 Restless legs syndrome: Secondary | ICD-10-CM | POA: Diagnosis not present

## 2021-09-22 DIAGNOSIS — I10 Essential (primary) hypertension: Secondary | ICD-10-CM | POA: Diagnosis not present

## 2021-09-22 MED ORDER — ATENOLOL 25 MG PO TABS: 25.0000 mg | ORAL_TABLET | Freq: Every day | ORAL | 4 refills | Status: DC

## 2021-09-22 NOTE — Patient Instructions (Signed)
.   Please review the attached list of medications and notify my office if there are any errors.   . Please bring all of your medications to every appointment so we can make sure that our medication list is the same as yours.   

## 2021-09-22 NOTE — Progress Notes (Signed)
?  ? ?I,Roshena L Chambers,acting as a scribe for [name], MD.,have documented all relevant documentation on the behalf of [name], MD,as directed by  [name], MD while in the presence of [name], MD.  ? ?Established patient visit ? ? ?Patient: Heidi Hernandez   DOB: 03-12-1951   71 y.o. Female  MRN: [id number] ?Visit Date: 09/22/2021 ? ?Today's healthcare provider: [name], MD  ? ?Chief Complaint  ?Patient presents with  ? Hypertension  ? ?Subjective  ?  ?HPI  ?Hypertension, follow-up ? ?BP Readings from Last 3 Encounters:  ?09/22/21 136/68  ?06/25/21 138/65  ?10/29/20 118/64  ? Wt Readings from Last 3 Encounters:  ?09/22/21 134 lb (60.8 kg)  ?06/25/21 131 lb (59.4 kg)  ?06/21/20 145 lb (65.8 kg)  ?  ? ?She was last seen for hypertension 3 months ago.  ?BP at that visit was 138/65. Management since that visit includes increasing amlodipine to 10mg  daily.  ?She reports good compliance with treatment. ?She is not having side effects.  ?She is following a Regular diet. ?She is exercising. ?She does not smoke. ? ?Use of agents associated with hypertension: none.  ? ?Outside blood pressures are 130/63. ?Symptoms: ?No chest pain No chest pressure  ?No palpitations No syncope  ?No dyspnea No orthopnea  ?No paroxysmal nocturnal dyspnea No lower extremity edema  ? ? ? ?---------------------------------------------------------------------------------------------------  ? ?Medications: ?Outpatient Medications Prior to Visit  ?Medication Sig  ? albuterol (VENTOLIN HFA) 108 (90 Base) MCG/ACT inhaler Inhale 2 puffs into the lungs every 6 (six) hours as needed for wheezing. May substitute generic or equivalent brand name  ? albuterol (VENTOLIN HFA) 108 (90 Base) MCG/ACT inhaler Inhale 2 puffs into the lungs every 6 (six) hours as needed for wheezing or shortness of breath.  ? alendronate (FOSAMAX) 70 MG tablet Take 1 tablet (70 mg total) by mouth once a week. Take with a full glass of water on an  empty stomach.  ? amLODipine (NORVASC) 10 MG tablet Take 1 tablet (10 mg total) by mouth daily.  ? atenolol (TENORMIN) 50 MG tablet Take 0.5 tablets (25 mg total) by mouth daily.  ? azelastine (ASTELIN) 0.1 % nasal spray Place 2 sprays into both nostrils daily.   ? EPINEPHrine 0.3 mg/0.3 mL IJ SOAJ injection as needed.  ? Fexofenadine HCl (ALLEGRA PO) Take 1 capsule by mouth daily.  ? fluticasone (FLONASE) 50 MCG/ACT nasal spray Place 2 sprays into both nostrils daily.   ? fluticasone (FLOVENT HFA) 44 MCG/ACT inhaler Inhale 1 puff into the lungs daily.  ? methadone (DOLOPHINE) 5 MG tablet Take 5 mg by mouth daily.  ? montelukast (SINGULAIR) 10 MG tablet Take 10 mg by mouth at bedtime.   ? omeprazole (PRILOSEC) 40 MG capsule Take 40 mg by mouth daily.  ? ondansetron (ZOFRAN ODT) 4 MG disintegrating tablet Take 1 tablet (4 mg total) by mouth every 8 (eight) hours as needed for nausea or vomiting.  ? pramipexole (MIRAPEX) 0.5 MG tablet Take 1 tablet by mouth in the morning and at bedtime.  ? SUMAtriptan (IMITREX) 100 MG tablet TAKE 1 TABLET BY MOUTH AT ONSET OF HEADACHE. MAY TAKE AN ADDITIONAL TABLET IN 2 HOURS IF NEEDED. MAX OF 200mg /24hr  ? Vitamin D, Ergocalciferol, (DRISDOL) 1.25 MG (50000 UNIT) CAPS capsule Take 1 capsule (50,000 Units total) by mouth once a week.  ? [DISCONTINUED] terbinafine (LAMISIL) 250 MG tablet Take 2 tablets (500 mg total) by mouth daily. Take for 1 week out of  every 4 weeks (Patient not taking: Reported on 09/22/2021)  ? ?No facility-administered medications prior to visit.  ? ? ?Review of Systems  ?Constitutional:  Negative for appetite change, chills, fatigue and fever.  ?Respiratory:  Negative for chest tightness and shortness of breath.   ?Cardiovascular:  Negative for chest pain and palpitations.  ?Gastrointestinal:  Negative for abdominal pain, nausea and vomiting.  ?Neurological:  Negative for dizziness and weakness.  ? ? ?  Objective  ?  ?BP 136/68 (BP Location: Left Arm, Patient  Position: Sitting, Cuff Size: Normal)   Pulse (!) 54   Temp 98.3 ?F (36.8 ?C) (Oral)   Resp 12   Wt 134 lb (60.8 kg)   SpO2 98% Comment: room air  BMI 23.00 kg/m?  ? ? ?Physical Exam  ? ? ? ? Assessment & Plan  ?  ? ?1. Essential hypertension ?Well controlled since increasing amlodipine and reducing amlodipine to 1/2 25mg  tablet day.  ?Continue current medications.   ?refill atenolol (TENORMIN) 25 MG tablet; Take 1 tablet (25 mg total) by mouth daily.  Dispense: 90 tablet; Refill: 4 ? ?2. Restless legs syndrome ?Doing very well with methadone prescribed by neurology ? ?Future Appointments  ?Date Time Provider Department Center  ?10/27/2021  1:15 PM [facility] None  ?10/28/2021  2:00 PM [place], 12/28/2021, MD [facility] None  ?10/28/2021  2:30 PM [facility] None  ?01/21/2022  9:00 AM 01/23/2022, MD [facility]  ?06/01/2022  9:40 AM [facility]-NURSE HEALTH ADVISOR [facility]  ?  ?   ? ?The entirety of the information documented in the History of Present Illness, Review of Systems and Physical Exam were personally obtained by me. Portions of this information were initially documented by the CMA and reviewed by me for thoroughness and accuracy.   ? ? ?14/11/2021, MD  ?[facility] ?[phone] (phone) ?[phone] (fax) ? ?[facility]  ?

## 2021-09-24 DIAGNOSIS — M65342 Trigger finger, left ring finger: Secondary | ICD-10-CM | POA: Diagnosis not present

## 2021-09-24 DIAGNOSIS — Z01818 Encounter for other preprocedural examination: Secondary | ICD-10-CM | POA: Diagnosis not present

## 2021-09-24 DIAGNOSIS — M65321 Trigger finger, right index finger: Secondary | ICD-10-CM | POA: Diagnosis not present

## 2021-10-01 DIAGNOSIS — M65342 Trigger finger, left ring finger: Secondary | ICD-10-CM | POA: Diagnosis not present

## 2021-10-01 DIAGNOSIS — M65321 Trigger finger, right index finger: Secondary | ICD-10-CM | POA: Diagnosis not present

## 2021-10-08 ENCOUNTER — Other Ambulatory Visit: Payer: Self-pay | Admitting: Family Medicine

## 2021-10-08 DIAGNOSIS — I10 Essential (primary) hypertension: Secondary | ICD-10-CM

## 2021-10-08 MED ORDER — AMLODIPINE BESYLATE 10 MG PO TABS: 10.0000 mg | ORAL_TABLET | Freq: Every day | ORAL | 4 refills | Status: DC

## 2021-10-12 DIAGNOSIS — J3081 Allergic rhinitis due to animal (cat) (dog) hair and dander: Secondary | ICD-10-CM | POA: Diagnosis not present

## 2021-10-12 DIAGNOSIS — J3089 Other allergic rhinitis: Secondary | ICD-10-CM | POA: Diagnosis not present

## 2021-10-12 DIAGNOSIS — J301 Allergic rhinitis due to pollen: Secondary | ICD-10-CM | POA: Diagnosis not present

## 2021-10-19 DIAGNOSIS — J3081 Allergic rhinitis due to animal (cat) (dog) hair and dander: Secondary | ICD-10-CM | POA: Diagnosis not present

## 2021-10-19 DIAGNOSIS — J301 Allergic rhinitis due to pollen: Secondary | ICD-10-CM | POA: Diagnosis not present

## 2021-10-19 DIAGNOSIS — J3089 Other allergic rhinitis: Secondary | ICD-10-CM | POA: Diagnosis not present

## 2021-10-22 NOTE — Progress Notes (Signed)
?[place]  ?[phone] [phone] ? ?ID: Heidi Hernandez OB: 1951/06/21  MR#: [id number]  [id number] ? ?Patient Care Team: ?Birdie Sons, MD as PCP - General (Family Medicine) ?Tiajuana Amass, MD as Referring Physician (Allergy and Immunology) ?Estill Cotta, MD as Consulting Physician (Ophthalmology) ?Geanie Kenning, PA-C (Gastroenterology) ?Kandy Garrison, MD as Referring Physician (Neurology) ?Anabel Bene, MD as Referring Physician (Neurology) ? ? ?CHIEF COMPLAINT: [name] ? ?INTERVAL HISTORY: Patient returns to clinic today for repeat laboratory work, further evaluation, and consideration of additional phlebotomy.  She has noticed increased fatigue recently, but otherwise feels well.  He has no neurologic complaints today. She denies any recent fevers or illnesses.  She has a good appetite and denies weight loss.  She has no chest pain, shortness of breath, cough, or hemoptysis.  She denies any nausea, vomiting, constipation, or diarrhea.  She has no urinary complaints.  Patient offers no further specific complaints today. ? ?REVIEW OF SYSTEMS:   ?Review of Systems  ?Constitutional:  Positive for malaise/fatigue. Negative for fever and weight loss.  ?Respiratory:  Negative for cough, hemoptysis and shortness of breath.   ?Cardiovascular: Negative.  Negative for chest pain and leg swelling.  ?Gastrointestinal: Negative.  Negative for abdominal pain and heartburn.  ?Genitourinary: Negative.  Negative for dysuria.  ?Musculoskeletal: Negative.  Negative for back pain.  ?Skin: Negative.  Negative for rash.  ?Neurological: Negative.  Negative for dizziness, focal weakness, weakness and headaches.  ?Psychiatric/Behavioral: Negative.  The patient does not have insomnia.   ? ?As per HPI. Otherwise, a complete review of systems is negative. ? ?PAST MEDICAL HISTORY: ?Past Medical History:  ?Diagnosis Date  ? Anxiety   ? Asthma   ? GERD  (gastroesophageal reflux disease)   ? Headache   ? Migraines  ? Hypertension   ? Idiopathic thrombocytopenia purpura (HCC)   ? Required splenectomy  ? MVP (mitral valve prolapse)   ? Neuromuscular disorder ([place])   ? Restless Leg Syndrome  ? Pancreatitis April 2017 [facility]  ? [place] due to cholecystitis.   ? Pneumonia   ? 06-2015  ? ? ?PAST SURGICAL HISTORY: ?Past Surgical History:  ?Procedure Laterality Date  ? ABDOMINAL HYSTERECTOMY    ? APPENDECTOMY    ? CARPAL TUNNEL RELEASE Bilateral 11/19/2014  ? Procedure: CARPAL TUNNEL RELEASE;  Surgeon: Dereck Leep, MD;  Location: [facility] ORS;  Service: Orthopedics;  Laterality: Bilateral;  ? CESAREAN SECTION    ? times 2  ? CHOLECYSTECTOMY  09/29/2015  ? [facility]  ? COLONOSCOPY N/A 05/02/2016  ? Procedure: COLONOSCOPY;  Surgeon: Manya Silvas, MD;  Location: [facility] ENDOSCOPY;  Service: Endoscopy;  Laterality: N/A;  ? ESOPHAGOGASTRODUODENOSCOPY (EGD) WITH PROPOFOL N/A 05/02/2016  ? Procedure: ESOPHAGOGASTRODUODENOSCOPY (EGD) WITH PROPOFOL;  Surgeon: Manya Silvas, MD;  Location: [facility] ENDOSCOPY;  Service: Endoscopy;  Laterality: N/A;  ? ESOPHAGOGASTRODUODENOSCOPY (EGD) WITH PROPOFOL N/A 11/22/2017  ? Procedure: ESOPHAGOGASTRODUODENOSCOPY (EGD) WITH PROPOFOL;  Surgeon: Manya Silvas, MD;  Location: [facility] ENDOSCOPY;  Service: Endoscopy;  Laterality: N/A;  ? HERNIA REPAIR    ? times 3  ? JOINT REPLACEMENT    ? OVARIAN CYST SURGERY    ? SHOULDER ARTHROSCOPY WITH ROTATOR CUFF REPAIR    ? times 2  ? SPLENECTOMY, TOTAL    ? TOTAL SHOULDER ARTHROPLASTY Right 04/21/2017  ? Procedure: REVERSE SHOULDER ARTHROPLASTY;  Surgeon: Leim Fabry, MD;  Location: [facility] ORS;  Service: Orthopedics;  Laterality: Right;  ? TRIGGER FINGER RELEASE Right 11/19/2014  ?  Procedure: RELEASE TRIGGER FINGER/A-1 PULLEY;  Surgeon: Dereck Leep, MD;  Location: [facility] ORS;  Service: Orthopedics;  Laterality: Right;  ? TRIGGER FINGER RELEASE Right 08/12/2015  ? Procedure: RELEASE TRIGGER LONG FINGER;  Surgeon: Dereck Leep, MD;  Location: [facility] ORS;  Service: Orthopedics;  Laterality: Right;  ? ? ?FAMILY HISTORY: ?Family History  ?Problem Relation Age of Onset  ? Hypertension Mother   ? Renal Disease Father   ?     ESRD with HD  ? ? ?ADVANCED DIRECTIVES (Y/N):  N ? ?HEALTH MAINTENANCE: ?Social History  ? ?Tobacco Use  ? Smoking status: Former  ?  Packs/day: 1.00  ?  Years: 10.00  ?  Pack years: 10.00  ?  Types: Cigarettes  ?  Quit date: 11/10/1979  ?  Years since quitting: 41.9  ? Smokeless tobacco: Never  ?Vaping Use  ? Vaping Use: Never used  ?Substance Use Topics  ? Alcohol use: Not Currently  ?  Alcohol/week: 0.0 standard drinks  ? Drug use: No  ? ? ? Colonoscopy: ? PAP: ? Bone density: ? Lipid panel: ? ?Allergies  ?Allergen Reactions  ? Augmentin [Amoxicillin-Pot Clavulanate] Nausea And Vomiting  ? Droperidol Other (See Comments)  ?  "Locked jaw" ?Facial muscles locked ?"Locked jaw"  ? Paroxetine Hcl Other (See Comments)  ?  syncope ?Passed out   ? Morphine Itching  ? ? ?Current Outpatient Medications  ?Medication Sig Dispense Refill  ? albuterol (VENTOLIN HFA) 108 (90 Base) MCG/ACT inhaler Inhale 2 puffs into the lungs every 6 (six) hours as needed for wheezing. May substitute generic or equivalent brand name 18 g 1  ? albuterol (VENTOLIN HFA) 108 (90 Base) MCG/ACT inhaler Inhale 2 puffs into the lungs every 6 (six) hours as needed for wheezing or shortness of breath. 8 g 2  ? alendronate (FOSAMAX) 70 MG tablet Take 1 tablet (70 mg total) by mouth once a week. Take with a full glass of water on an empty stomach. 12 tablet 4  ? amLODipine (NORVASC) 10 MG tablet Take 1 tablet (10 mg total) by mouth daily. 90 tablet 4  ? atenolol (TENORMIN) 25 MG tablet Take 1 tablet (25 mg total) by mouth daily. 90 tablet 4  ? azelastine (ASTELIN) 0.1 % nasal spray Place 2 sprays into both nostrils daily.     ? Fexofenadine HCl (ALLEGRA PO) Take 1 capsule by mouth daily.    ? fluticasone (FLONASE) 50 MCG/ACT nasal spray Place 2 sprays into  both nostrils daily.     ? fluticasone (FLOVENT HFA) 44 MCG/ACT inhaler Inhale 1 puff into the lungs daily.    ? methadone (DOLOPHINE) 5 MG tablet Take 5 mg by mouth daily.    ? montelukast (SINGULAIR) 10 MG tablet Take 10 mg by mouth at bedtime.     ? omeprazole (PRILOSEC) 40 MG capsule Take 40 mg by mouth daily.    ? pramipexole (MIRAPEX) 0.5 MG tablet Take 1 tablet by mouth in the morning and at bedtime.    ? SUMAtriptan (IMITREX) 100 MG tablet TAKE 1 TABLET BY MOUTH AT ONSET OF HEADACHE. MAY TAKE AN ADDITIONAL TABLET IN 2 HOURS IF NEEDED. MAX OF 200mg /24hr 9 tablet 2  ? Vitamin D, Ergocalciferol, (DRISDOL) 1.25 MG (50000 UNIT) CAPS capsule Take 1 capsule (50,000 Units total) by mouth once a week. 12 capsule 4  ? EPINEPHrine 0.3 mg/0.3 mL IJ SOAJ injection as needed. (Patient not taking: Reported on 10/28/2021)    ? ondansetron (ZOFRAN ODT)  4 MG disintegrating tablet Take 1 tablet (4 mg total) by mouth every 8 (eight) hours as needed for nausea or vomiting. (Patient not taking: Reported on 10/28/2021) 20 tablet 0  ? ?No current facility-administered medications for this visit.  ? ? ?OBJECTIVE: ?Vitals:  ? 10/28/21 1404  ?BP: (!) 166/72  ?Pulse: (!) 56  ?Resp: 16  ?SpO2: 100%  ?   Body mass index is 22.41 kg/m?Marland Kitchen    ECOG FS:0 - Asymptomatic ? ?General: Well-developed, well-nourished, no acute distress. ?Eyes: Pink conjunctiva, anicteric sclera. ?HEENT: Normocephalic, moist mucous membranes. ?Lungs: No audible wheezing or coughing. ?Heart: Regular rate and rhythm. ?Abdomen: Soft, nontender, no obvious distention. ?Musculoskeletal: No edema, cyanosis, or clubbing. ?Neuro: Alert, answering all questions appropriately. Cranial nerves grossly intact. ?Skin: No rashes or petechiae noted. ?Psych: Normal affect. ? ?LAB RESULTS: ? ?Lab Results  ?Component Value Date  ? NA 138 10/27/2021  ? K 3.6 10/27/2021  ? CL 108 10/27/2021  ? CO2 25 10/27/2021  ? GLUCOSE 98 10/27/2021  ? BUN 18 10/27/2021  ? CREATININE 0.59 10/27/2021  ?  CALCIUM 8.9 10/27/2021  ? PROT 7.0 10/27/2021  ? ALBUMIN 4.3 10/27/2021  ? AST 22 10/27/2021  ? ALT 18 10/27/2021  ? ALKPHOS 50 10/27/2021  ? BILITOT 0.9 10/27/2021  ? GFRNONAA >60 10/27/2021  ? GFRAA 99 12/09/202

## 2021-10-26 ENCOUNTER — Other Ambulatory Visit: Payer: Self-pay | Admitting: Emergency Medicine

## 2021-10-27 ENCOUNTER — Inpatient Hospital Stay: Payer: Medicare HMO | Attending: Oncology

## 2021-10-27 DIAGNOSIS — I10 Essential (primary) hypertension: Secondary | ICD-10-CM | POA: Insufficient documentation

## 2021-10-27 DIAGNOSIS — Z79891 Long term (current) use of opiate analgesic: Secondary | ICD-10-CM | POA: Diagnosis not present

## 2021-10-27 DIAGNOSIS — R5383 Other fatigue: Secondary | ICD-10-CM | POA: Insufficient documentation

## 2021-10-27 DIAGNOSIS — Z87891 Personal history of nicotine dependence: Secondary | ICD-10-CM | POA: Diagnosis not present

## 2021-10-27 DIAGNOSIS — G2581 Restless legs syndrome: Secondary | ICD-10-CM | POA: Insufficient documentation

## 2021-10-27 LAB — CBC WITH DIFFERENTIAL/PLATELET
Abs Immature Granulocytes: 0.03 10*3/uL (ref 0.00–0.07)
Basophils Absolute: 0.1 10*3/uL (ref 0.0–0.1)
Basophils Relative: 1 %
Eosinophils Absolute: 0.1 10*3/uL (ref 0.0–0.5)
Eosinophils Relative: 1 %
HCT: 40.6 % (ref 36.0–46.0)
Hemoglobin: 14.1 g/dL (ref 12.0–15.0)
Immature Granulocytes: 0 %
Lymphocytes Relative: 32 %
Lymphs Abs: 3.2 10*3/uL (ref 0.7–4.0)
MCH: 32.1 pg (ref 26.0–34.0)
MCHC: 34.7 g/dL (ref 30.0–36.0)
MCV: 92.5 fL (ref 80.0–100.0)
Monocytes Absolute: 0.7 10*3/uL (ref 0.1–1.0)
Monocytes Relative: 7 %
Neutro Abs: 5.9 10*3/uL (ref 1.7–7.7)
Neutrophils Relative %: 59 %
Platelets: 150 10*3/uL (ref 150–400)
RBC: 4.39 MIL/uL (ref 3.87–5.11)
RDW: 14.1 % (ref 11.5–15.5)
Smear Review: NORMAL
WBC: 10 10*3/uL (ref 4.0–10.5)
nRBC: 0 % (ref 0.0–0.2)

## 2021-10-27 LAB — COMPREHENSIVE METABOLIC PANEL
ALT: 18 U/L (ref 0–44)
AST: 22 U/L (ref 15–41)
Albumin: 4.3 g/dL (ref 3.5–5.0)
Alkaline Phosphatase: 50 U/L (ref 38–126)
Anion gap: 5 (ref 5–15)
BUN: 18 mg/dL (ref 8–23)
CO2: 25 mmol/L (ref 22–32)
Calcium: 8.9 mg/dL (ref 8.9–10.3)
Chloride: 108 mmol/L (ref 98–111)
Creatinine, Ser: 0.59 mg/dL (ref 0.44–1.00)
GFR, Estimated: >60 mL/min (ref >60)
Glucose, Bld: 98 mg/dL (ref 70–99)
Potassium: 3.6 mmol/L (ref 3.5–5.1)
Sodium: 138 mmol/L (ref 135–145)
Total Bilirubin: 0.9 mg/dL (ref 0.3–1.2)
Total Protein: 7 g/dL (ref 6.5–8.1)

## 2021-10-27 LAB — FERRITIN: Ferritin: 46 ng/mL (ref 11–307)

## 2021-10-27 LAB — IRON AND TIBC
Iron: 148 ug/dL (ref 28–170)
Saturation Ratios: 50 % — ABNORMAL HIGH (ref 10.4–31.8)
TIBC: 298 ug/dL (ref 250–450)
UIBC: 150 ug/dL

## 2021-10-28 ENCOUNTER — Inpatient Hospital Stay (HOSPITAL_BASED_OUTPATIENT_CLINIC_OR_DEPARTMENT_OTHER): Payer: Medicare HMO | Admitting: Oncology

## 2021-10-28 ENCOUNTER — Inpatient Hospital Stay: Payer: Medicare HMO

## 2021-10-28 DIAGNOSIS — I10 Essential (primary) hypertension: Secondary | ICD-10-CM | POA: Diagnosis not present

## 2021-10-28 DIAGNOSIS — G2581 Restless legs syndrome: Secondary | ICD-10-CM | POA: Diagnosis not present

## 2021-10-28 DIAGNOSIS — R5383 Other fatigue: Secondary | ICD-10-CM | POA: Diagnosis not present

## 2021-10-28 DIAGNOSIS — Z79891 Long term (current) use of opiate analgesic: Secondary | ICD-10-CM | POA: Diagnosis not present

## 2021-10-28 DIAGNOSIS — Z87891 Personal history of nicotine dependence: Secondary | ICD-10-CM | POA: Diagnosis not present

## 2021-10-28 NOTE — Progress Notes (Signed)
Per MD proceed with phlebotomy, patient tolatered well, performed in LAC. 500 cc removed. No concerns voiced. Snack and po fluids offered/ accepted. Stable at discharge. AVS given.   ?

## 2021-10-28 NOTE — Patient Instructions (Signed)

## 2021-10-28 NOTE — Progress Notes (Signed)
Pt reports weight loss, diarrhea, nausea, nocturia and poor appetite. Pt has multiple questions about medications and side effects. ?

## 2021-10-29 ENCOUNTER — Encounter: Payer: Self-pay | Admitting: Oncology

## 2021-11-02 DIAGNOSIS — J3089 Other allergic rhinitis: Secondary | ICD-10-CM | POA: Diagnosis not present

## 2021-11-02 DIAGNOSIS — J301 Allergic rhinitis due to pollen: Secondary | ICD-10-CM | POA: Diagnosis not present

## 2021-11-02 DIAGNOSIS — J3081 Allergic rhinitis due to animal (cat) (dog) hair and dander: Secondary | ICD-10-CM | POA: Diagnosis not present

## 2021-11-09 ENCOUNTER — Ambulatory Visit: Payer: Self-pay | Admitting: *Deleted

## 2021-11-09 ENCOUNTER — Ambulatory Visit (INDEPENDENT_AMBULATORY_CARE_PROVIDER_SITE_OTHER): Payer: Medicare HMO | Admitting: Family Medicine

## 2021-11-09 ENCOUNTER — Encounter: Payer: Self-pay | Admitting: Family Medicine

## 2021-11-09 VITALS — BP 141/61 | HR 56 | Temp 97.8°F | Resp 14 | Wt 128.0 lb

## 2021-11-09 DIAGNOSIS — R319 Hematuria, unspecified: Secondary | ICD-10-CM

## 2021-11-09 DIAGNOSIS — N390 Urinary tract infection, site not specified: Secondary | ICD-10-CM | POA: Diagnosis not present

## 2021-11-09 DIAGNOSIS — R300 Dysuria: Secondary | ICD-10-CM

## 2021-11-09 LAB — POCT URINALYSIS DIPSTICK
Bilirubin, UA: NEGATIVE
Glucose, UA: NEGATIVE
Ketones, UA: NEGATIVE
Nitrite, UA: POSITIVE
Protein, UA: POSITIVE — AB
Spec Grav, UA: 1.025 (ref 1.010–1.025)
Urobilinogen, UA: 0.2 E.U./dL
pH, UA: 6 (ref 5.0–8.0)

## 2021-11-09 MED ORDER — CEPHALEXIN 500 MG PO CAPS: 500.0000 mg | ORAL_CAPSULE | Freq: Three times a day (TID) | ORAL | 0 refills | Status: AC

## 2021-11-09 NOTE — Telephone Encounter (Signed)
The patient has experienced urinary discomfort for roughly a week  ? ?The patient is experiencing nausea, increased frequency and odor in urine    ? ?The patient would like to be prescribed something for their symptoms  ? ?Please contact further when possible  ? ? ?Reason for Disposition ? Bad or foul-smelling urine ? ?Answer Assessment - Initial Assessment Questions ?1. SYMPTOM: "What's the main symptom you're concerned about?" (e.g., frequency, incontinence) ?    Frequency, urgency ?2. ONSET: "When did the    start?" ?    3 days ago ?3. PAIN: "Is there any pain?" If Yes, ask: "How bad is it?" (Scale: 1-10; mild, moderate, severe) ?    Yes, 9/10, burning ?4. CAUSE: "What do you think is causing the symptoms?" ?    UTI ?5. OTHER SYMPTOMS: "Do you have any other symptoms?" (e.g., fever, flank pain, blood in urine, pain with urination) ?    Malodorous, pain with urination ? ?Protocols used: Urinary Symptoms-A-AH ? ?

## 2021-11-09 NOTE — Progress Notes (Signed)
?  ? ? ?I,Roshena L Chambers,acting as a scribe for [name], MD.,have documented all relevant documentation on the behalf of [name], MD,as directed by  [name], MD while in the presence of [name], MD.  ? ?Established patient visit ? ? ?Patient: Heidi Hernandez   DOB: August 26, 1950   71 y.o. Female  MRN: [id number] ?Visit Date: 11/09/2021 ? ?Today's healthcare provider: [name], MD  ? ?Chief Complaint  ?Patient presents with  ? Dysuria  ? ?Subjective  ?  ?Dysuria  ?This is a new problem. Episode onset: 2 days ago. The problem has been gradually worsening. The quality of the pain is described as burning. Associated symptoms include frequency, nausea and urgency. Pertinent negatives include no chills or vomiting. Treatments tried: cranberry juice. The treatment provided no relief.   ? ? ?Medications: ?Outpatient Medications Prior to Visit  ?Medication Sig  ? albuterol (VENTOLIN HFA) 108 (90 Base) MCG/ACT inhaler Inhale 2 puffs into the lungs every 6 (six) hours as needed for wheezing. May substitute generic or equivalent brand name  ? albuterol (VENTOLIN HFA) 108 (90 Base) MCG/ACT inhaler Inhale 2 puffs into the lungs every 6 (six) hours as needed for wheezing or shortness of breath.  ? alendronate (FOSAMAX) 70 MG tablet Take 1 tablet (70 mg total) by mouth once a week. Take with a full glass of water on an empty stomach.  ? amLODipine (NORVASC) 10 MG tablet Take 1 tablet (10 mg total) by mouth daily.  ? atenolol (TENORMIN) 25 MG tablet Take 1 tablet (25 mg total) by mouth daily.  ? azelastine (ASTELIN) 0.1 % nasal spray Place 2 sprays into both nostrils daily.   ? EPINEPHrine 0.3 mg/0.3 mL IJ SOAJ injection as needed.  ? Fexofenadine HCl (ALLEGRA PO) Take 1 capsule by mouth daily.  ? fluticasone (FLONASE) 50 MCG/ACT nasal spray Place 2 sprays into both nostrils daily.   ? fluticasone (FLOVENT HFA) 44 MCG/ACT inhaler Inhale 1 puff into the lungs daily.  ? methadone (DOLOPHINE) 5 MG tablet  Take 5 mg by mouth daily.  ? montelukast (SINGULAIR) 10 MG tablet Take 10 mg by mouth at bedtime.   ? omeprazole (PRILOSEC) 40 MG capsule Take 40 mg by mouth daily.  ? ondansetron (ZOFRAN ODT) 4 MG disintegrating tablet Take 1 tablet (4 mg total) by mouth every 8 (eight) hours as needed for nausea or vomiting.  ? pramipexole (MIRAPEX) 0.5 MG tablet Take 1 tablet by mouth in the morning and at bedtime.  ? SUMAtriptan (IMITREX) 100 MG tablet TAKE 1 TABLET BY MOUTH AT ONSET OF HEADACHE. MAY TAKE AN ADDITIONAL TABLET IN 2 HOURS IF NEEDED. MAX OF 200mg /24hr  ? Vitamin D, Ergocalciferol, (DRISDOL) 1.25 MG (50000 UNIT) CAPS capsule Take 1 capsule (50,000 Units total) by mouth once a week.  ? ?No facility-administered medications prior to visit.  ? ? ?Review of Systems  ?Constitutional:  Positive for fatigue. Negative for appetite change, chills and fever.  ?Respiratory:  Negative for chest tightness and shortness of breath.   ?Cardiovascular:  Negative for chest pain and palpitations.  ?Gastrointestinal:  Positive for nausea. Negative for abdominal pain and vomiting.  ?Genitourinary:  Positive for dysuria, frequency and urgency.  ?Neurological:  Negative for dizziness and weakness.  ? ? ?  Objective  ?  ?BP (!) 141/61 (BP Location: Left Arm, Patient Position: Sitting, Cuff Size: Normal)   Pulse (!) 56   Temp 97.8 ?F (36.6 ?C) (Oral)   Resp 14   Wt  128 lb (58.1 kg)   SpO2 99% Comment: room air  BMI 22.32 kg/m?  ? ? ?Physical Exam  ? ?Results for orders placed or performed in visit on 11/09/21  ?POCT Urinalysis Dipstick  ?Result Value Ref Range  ? Color, UA yellow   ? Clarity, UA cloudy   ? Glucose, UA Negative Negative  ? Bilirubin, UA negative   ? Ketones, UA negative   ? Spec Grav, UA 1.025 1.010 - 1.025  ? Blood, UA Moderate (hemolyzed)   ? pH, UA 6.0 5.0 - 8.0  ? Protein, UA Positive (A) Negative  ? Urobilinogen, UA 0.2 0.2 or 1.0 E.U./dL  ? Nitrite, UA Positive   ? Leukocytes, UA Moderate (2+) (A) Negative  ?  Appearance    ? Odor    ?  ? Assessment & Plan  ?  ? ?1. Dysuria ? ?2. Urinary tract infection with hematuria, site unspecified ? ?- cephALEXin (KEFLEX) 500 MG capsule; Take 1 capsule (500 mg total) by mouth 3 (three) times daily for 7 days.  Dispense: 21 capsule; Refill: 0  ? ?- Urine Culture ?   ? ?The entirety of the information documented in the History of Present Illness, Review of Systems and Physical Exam were personally obtained by me. Portions of this information were initially documented by the CMA and reviewed by me for thoroughness and accuracy.   ? ? ?[name], MD  ?[facility] ?[phone] (phone) ?[phone] (fax) ? ?[facility]  ?

## 2021-11-09 NOTE — Telephone Encounter (Signed)
?  Chief Complaint: Dysuria ?Symptoms: dysuria,frequency,urgency,malodorous urine ?Frequency: 3 days ?Pertinent Negatives: Patient denies fever ?Disposition: [] ED /[] Urgent Care (no appt availability in office) / [x] Appointment(In office/virtual)/ []  [place] [facility]/ [] Home Care/ [] Refused Recommended Disposition /[] [place] [facility]/ []  Follow-up with PCP ?Additional Notes: Appt secured for today with Dr. . CAre advise provided,pt verbalizes understanding.  ? ?

## 2021-11-13 LAB — URINE CULTURE

## 2021-11-25 ENCOUNTER — Ambulatory Visit (INDEPENDENT_AMBULATORY_CARE_PROVIDER_SITE_OTHER): Payer: Medicare HMO | Admitting: Dermatology

## 2021-11-25 DIAGNOSIS — L720 Epidermal cyst: Secondary | ICD-10-CM | POA: Diagnosis not present

## 2021-11-25 DIAGNOSIS — L578 Other skin changes due to chronic exposure to nonionizing radiation: Secondary | ICD-10-CM

## 2021-11-25 DIAGNOSIS — L820 Inflamed seborrheic keratosis: Secondary | ICD-10-CM

## 2021-11-25 DIAGNOSIS — L309 Dermatitis, unspecified: Secondary | ICD-10-CM | POA: Diagnosis not present

## 2021-11-25 DIAGNOSIS — L821 Other seborrheic keratosis: Secondary | ICD-10-CM | POA: Diagnosis not present

## 2021-11-25 NOTE — Progress Notes (Addendum)
New Patient Visit  Subjective  Heidi Hernandez is a 71 y.o. female who presents for the following: New Patient (Initial Visit) (Reports a several spots she would like checked, under left breast, at left nose / cheek area, right lower leg, and dry scaly patches at lower legs. Patient denies personal or family history of skin cancer. ). The patient has spots, moles and lesions to be evaluated, some may be new or changing and the patient has concerns that these could be cancer.  The following portions of the chart were reviewed this encounter and updated as appropriate:   Tobacco  Allergies  Meds  Problems  Med Hx  Surg Hx  Fam Hx     Review of Systems:  No other skin or systemic complaints except as noted in HPI or Assessment and Plan.  Objective  Well appearing patient in no apparent distress; mood and affect are within normal limits.  A focused examination was performed including face, left breast, b/l lower legs . Relevant physical exam findings are noted in the Assessment and Plan.  left breast lower outer quadrant 0.5 cm pink papule   left paranasal 0.3 cm white papule   right pretibial 2.1 cm erythematous stuck-on, waxy papule or plaque       at b/l lower legs Scaly erythematous papules and patches +/- dyspigmentation, lichenification, excoriations.    Assessment & Plan  Epidermal inclusion cyst (2) left breast lower outer quadrant; left paranasal  At left breast breast lower outer quadrant   Discussed benign appearing, patient reports does not currently bother her but was concerned it was harmful.   Discussed will evaluate at next follow up, if patient is still concerned about area will remove area at left breast lower outer quadrant and left paranasal at follow up  Benign-appearing. Exam most consistent with an epidermal inclusion cyst. Discussed that a cyst is a benign growth that can grow over time and sometimes get irritated or inflamed. Recommend  observation if it is not bothersome. Discussed option of surgical excision to remove it if it is growing, symptomatic, or other changes noted. Please call for new or changing lesions so they can be evaluated.  Benign-appearing.  Observation.  Call clinic for new or changing lesions.  Recommend daily use of broad spectrum spf 30+ sunscreen to sun-exposed areas.   Inflamed seborrheic keratosis right pretibial Symptomatic, irritating, patient would like treated.  Will recheck in 3 months   Destruction of lesion - right pretibial Complexity: simple   Destruction method: cryotherapy   Informed consent: discussed and consent obtained   Timeout:  patient name, date of birth, surgical site, and procedure verified Lesion destroyed using liquid nitrogen: Yes   Region frozen until ice ball extended beyond lesion: Yes   Outcome: patient tolerated procedure well with no complications   Post-procedure details: wound care instructions given   Additional details:  Prior to procedure, discussed risks of blister formation, small wound, skin dyspigmentation, or rare scar following cryotherapy. Recommend Vaseline ointment to treated areas while healing.  Eczema, unspecified type at b/l lower legs  Atopic dermatitis (eczema) is a chronic, relapsing, pruritic condition that can significantly affect quality of life. It is often associated with allergic rhinitis and/or asthma and can require treatment with topical medications, phototherapy, or in severe cases biologic injectable medication (Dupixent; Adbry) or Oral JAK inhibitors.   TMC cream as needed for flares at b/l legs  Topical steroids (such as triamcinolone, fluocinolone, fluocinonide, mometasone, clobetasol, halobetasol, betamethasone, hydrocortisone)  can cause thinning and lightening of the skin if they are used for too long in the same area. Your physician has selected the right strength medicine for your problem and area affected on the body. Please  use your medication only as directed by your physician to prevent side effects.   Seborrheic Keratoses - Stuck-on, waxy, tan-brown papules and/or plaques  - Benign-appearing - Discussed benign etiology and prognosis. - Observe - Call for any changes  Actinic Damage - chronic, secondary to cumulative UV radiation exposure/sun exposure over time - diffuse scaly erythematous macules with underlying dyspigmentation - Recommend daily broad spectrum sunscreen SPF 30+ to sun-exposed areas, reapply every 2 hours as needed.  - Recommend staying in the shade or wearing long sleeves, sun glasses (UVA+UVB protection) and wide brim hats (4-inch brim around the entire circumference of the hat). - Call for new or changing lesions.  Return in about 3 months (around 02/25/2022) for isk follow up, epidermal cyst follow up at lt paranasal and left breast . I, Asher Muir, CMA, am acting as scribe for Armida Sans, MD. Documentation: I have reviewed the above documentation for accuracy and completeness, and I agree with the above.  Armida Sans, MD

## 2021-11-25 NOTE — Patient Instructions (Addendum)
Can continue triamcinolone cream as needed for dry scaly areas at legs.   Topical steroids (such as triamcinolone, fluocinolone, fluocinonide, mometasone, clobetasol, halobetasol, betamethasone, hydrocortisone) can cause thinning and lightening of the skin if they are used for too long in the same area. Your physician has selected the right strength medicine for your problem and area affected on the body. Please use your medication only as directed by your physician to prevent side effects.     Cryotherapy Aftercare  Wash gently with soap and water everyday.   Apply Vaseline and Band-Aid daily until healed.   Seborrheic Keratosis  What causes seborrheic keratoses? Seborrheic keratoses are harmless, common skin growths that first appear during adult life.  As time goes by, more growths appear.  Some people may develop a large number of them.  Seborrheic keratoses appear on both covered and uncovered body parts.  They are not caused by sunlight.  The tendency to develop seborrheic keratoses can be inherited.  They vary in color from skin-colored to gray, brown, or even black.  They can be either smooth or have a rough, warty surface.   Seborrheic keratoses are superficial and look as if they were stuck on the skin.  Under the microscope this type of keratosis looks like layers upon layers of skin.  That is why at times the top layer may seem to fall off, but the rest of the growth remains and re-grows.    Treatment Seborrheic keratoses do not need to be treated, but can easily be removed in the office.  Seborrheic keratoses often cause symptoms when they rub on clothing or jewelry.  Lesions can be in the way of shaving.  If they become inflamed, they can cause itching, soreness, or burning.  Removal of a seborrheic keratosis can be accomplished by freezing, burning, or surgery. If any spot bleeds, scabs, or grows rapidly, please return to have it checked, as these can be an indication of a skin  cancer.   Cryotherapy Aftercare  Wash gently with soap and water everyday.   Apply Vaseline and Band-Aid daily until healed.     If You Need Anything After Your Visit  If you have any questions or concerns for your doctor, please call our main line at [phone] and press option 4 to reach your doctor's medical assistant. If no one answers, please leave a voicemail as directed and we will return your call as soon as possible. Messages left after 4 pm will be answered the following business day.   You may also send [place] a message via MyChart. We typically respond to MyChart messages within 1-2 business days.  For prescription refills, please ask your pharmacy to contact our office. Our fax number is [phone].  If you have an urgent issue when the clinic is closed that cannot wait until the next business day, you can page your doctor at the number below.    Please note that while we do our best to be available for urgent issues outside of office hours, we are not available 24/7.   If you have an urgent issue and are unable to reach [place], you may choose to seek medical care at your doctor's office, retail clinic, urgent care center, or emergency room.  If you have a medical emergency, please immediately call 911 or go to the emergency department.  Pager Numbers  - Dr. Gwen Pounds: [phone]  - Dr. Neale Burly: [phone]  - Dr. Roseanne Reno: [phone]  In the event of inclement weather, please  call our main line at [phone] for an update on the status of any delays or closures.  Dermatology Medication Tips: Please keep the boxes that topical medications come in in order to help keep track of the instructions about where and how to use these. Pharmacies typically print the medication instructions only on the boxes and not directly on the medication tubes.   If your medication is too expensive, please contact our office at [phone] option 4 or send [place] a message through MyChart.    We are unable to tell what your co-pay for medications will be in advance as this is different depending on your insurance coverage. However, we may be able to find a substitute medication at lower cost or fill out paperwork to get insurance to cover a needed medication.   If a prior authorization is required to get your medication covered by your insurance company, please allow [place] 1-2 business days to complete this process.  Drug prices often vary depending on where the prescription is filled and some pharmacies may offer cheaper prices.  The website www.goodrx.com contains coupons for medications through different pharmacies. The prices here do not account for what the cost may be with help from insurance (it may be cheaper with your insurance), but the website can give you the price if you did not use any insurance.  - You can print the associated coupon and take it with your prescription to the pharmacy.  - You may also stop by our office during regular business hours and pick up a GoodRx coupon card.  - If you need your prescription sent electronically to a different pharmacy, notify our office through [facility] or by phone at [phone] option 4.     Si Usted Necesita Algo Despus de Su Visita  Tambin puede enviarnos un mensaje a travs de Clinical cytogeneticist. Por lo general respondemos a los mensajes de MyChart en el transcurso de 1 a 2 das hbiles.  Para renovar recetas, por favor pida a su farmacia que se ponga en contacto con nuestra oficina. Annie Sable de fax es [place] [phone].  Si tiene un asunto urgente cuando la clnica est cerrada y que no puede esperar hasta el siguiente da hbil, puede llamar/localizar a su doctor(a) al nmero que aparece a continuacin.   Por favor, tenga en cuenta que aunque hacemos todo lo posible para estar disponibles para asuntos urgentes fuera del horario de [place], no estamos disponibles las 24 horas del da, los 7 [address]  [address] de la [place].    Si tiene un problema urgente y no puede comunicarse con nosotros, puede optar por buscar atencin mdica  en el consultorio de su doctor(a), en una clnica privada, en un centro de atencin urgente o en una sala de emergencias.  Si tiene Engineer, drilling, por favor llame inmediatamente al 911 o vaya a la sala de emergencias.  Nmeros de bper  - Dr. Gwen Pounds: [phone]  - Dra. Moye: [phone]  - Dra. Roseanne Reno: [phone]  En caso de inclemencias del [place], por favor llame a Lacy Duverney principal al [phone] para una actualizacin sobre el [place] de cualquier retraso o cierre.  Consejos para la medicacin en dermatologa: Por favor, guarde las cajas en las que vienen los medicamentos de uso tpico para ayudarle a seguir las instrucciones sobre dnde y cmo usarlos. Las farmacias generalmente imprimen las instrucciones del medicamento slo en las cajas y no directamente en los tubos del [place].   Si su medicamento es [employer], por  favor, pngase en contacto con nuestra oficina llamando al [phone] y presione la opcin 4 o envenos un mensaje a travs de Clinical cytogeneticistMyChart.   No podemos decirle cul ser su copago por los medicamentos por adelantado ya que esto es diferente dependiendo de la cobertura de su seguro. Sin embargo, es posible que podamos encontrar un medicamento sustituto a Audiological scientistmenor costo o llenar un formulario para que el seguro cubra el medicamento que se considera necesario.   Si se requiere una autorizacin previa para que su compaa de seguros [place] su medicamento, por favor permtanos de 1 a 2 das hbiles para completar [address].  Los precios de los medicamentos varan con frecuencia dependiendo del Environmental consultantlugar de dnde se surte la receta y alguna farmacias pueden ofrecer precios ms baratos.  El sitio web www.goodrx.com tiene cupones para medicamentos de Health and safety inspectordiferentes farmacias. Los precios aqu no tienen en cuenta lo que podra costar con la ayuda del seguro  (puede ser ms barato con su seguro), pero el sitio web puede darle el precio si no utiliz Tourist information centre managerningn seguro.  - Puede imprimir el cupn correspondiente y llevarlo con su receta a la farmacia.  - Tambin puede pasar por nuestra oficina durante el horario de atencin regular y Education officer, museumrecoger una tarjeta de cupones de GoodRx.  - Si necesita que su receta se enve electrnicamente a una farmacia diferente, informe a nuestra oficina a travs de MyChart de [place] o por telfono llamando al [phone] y presione la opcin 4.

## 2021-11-29 ENCOUNTER — Ambulatory Visit: Payer: Self-pay | Admitting: *Deleted

## 2021-11-29 ENCOUNTER — Encounter: Payer: Self-pay | Admitting: Dermatology

## 2021-11-29 NOTE — Telephone Encounter (Signed)
  Chief Complaint: left great toe pain , requesting appt  Symptoms: hit left great toe against furniture 2 nights ago and now swelling left great toe to joint. Redness noted. Pain walking and toe deformed going under other toes , toe numb Frequency: 2 nights ago  Pertinent Negatives: Patient denies severe pain, no fever no bleeding or no foot swelling  Disposition: [] ED /[] Urgent Care (no appt availability in office) / [x] Appointment(In office/virtual)/ []  [facility]/ [] Home Care/ [] Refused Recommended Disposition /[] [facility]/ []  Follow-up with PCP Additional Notes:    Appt scheduled for tomorrow morning    Reason for Disposition  Looks like a broken bone (e.g., crooked or deformed)  Answer Assessment - Initial Assessment Questions 1. MECHANISM: "How did the injury happen?"      Got up in middle of the night and hit toe again edge of furniture 2. ONSET: "When did the injury happen?" (Minutes or hours ago)      2 nights ago  3. LOCATION: "What part of the toe is injured?" "Is the nail damaged?"      Left great toe unknown if nail is damaged due to polish  4. APPEARANCE of TOE INJURY: "What does the injury look like?"      Great toe looks like going under other toes  5. SEVERITY: "Can you use the foot normally?" "Can you walk?"      Can walk normally but now feeling pain  6. SIZE: For cuts, bruises, or swelling, ask: "How large is it?" (e.g., inches or centimeters;  entire toe)      Swelling left great toe  7. PAIN: "Is there pain?" If Yes, ask: "How bad is the pain?"   (e.g., Scale 1-10; or mild, moderate, severe)     Pain  8. TETANUS: For any breaks in the skin, ask: "When was the last tetanus booster?"     na 9. DIABETES: "Do you have a history of diabetes or poor circulation in the feet?"     na 10. OTHER SYMPTOMS: "Do you have any other symptoms?"        Pain , left great toe swelling  11. PREGNANCY: "Is there any chance you are pregnant?" "When was  your last menstrual period?"       na  Protocols used: Toe Injury-A-AH

## 2021-11-30 ENCOUNTER — Encounter: Payer: Self-pay | Admitting: Physician Assistant

## 2021-11-30 ENCOUNTER — Ambulatory Visit (INDEPENDENT_AMBULATORY_CARE_PROVIDER_SITE_OTHER): Payer: Medicare HMO | Admitting: Physician Assistant

## 2021-11-30 VITALS — BP 154/74 | HR 59 | Ht 63.5 in | Wt 130.2 lb

## 2021-11-30 DIAGNOSIS — S99922A Unspecified injury of left foot, initial encounter: Secondary | ICD-10-CM | POA: Diagnosis not present

## 2021-11-30 NOTE — Progress Notes (Signed)
    I,Sha'taria Tyson,acting as a Neurosurgeon for [employer], PA-C.,have documented all relevant documentation on the behalf of Heidi Ferguson, PA-C,as directed by  Heidi Ferguson, PA-C while in the presence of Heidi Ferguson, PA-C.   Acute Office Visit  Subjective:     Patient ID: Heidi Hernandez, female    DOB: June 06, 1951, 71 y.o.   MRN: [id number]  Cc. Left toe pain x 1 day  HPI Heidi Hernandez is a 71 y/o female who presents today after stubbing her toe a few days ago, starting yesterday felt some pain while walking in her left great toe. Noticed a bump that wasn't there before. Denies significant pain, denies lack of ROM, bruising.      Objective:    Blood pressure (!) 154/74, pulse (!) 59, height 5' 3.5" (1.613 m), weight 130 lb 3.2 oz (59.1 kg), SpO2 100 %.   Physical Exam Vitals reviewed.  Constitutional:      Appearance: She is not ill-appearing.  HENT:     Head: Normocephalic.  Eyes:     Conjunctiva/sclera: Conjunctivae normal.  Cardiovascular:     Rate and Rhythm: Normal rate.  Pulmonary:     Effort: Pulmonary effort is normal. No respiratory distress.  Musculoskeletal:     Comments: Left great toe with a minimal amount of edema, slight decrease in ROM compared to right foot.  No ecchymosis, crepitus, erythema.   Neurological:     General: No focal deficit present.     Mental Status: She is alert and oriented to person, place, and time.  Psychiatric:        Mood and Affect: Mood normal.        Behavior: Behavior normal.    No results found for any visits on 11/30/21.      Assessment & Plan:   Injury to left great toe Gave pt option of xray, but likely would not change course of treatment Advised ice, buddy taped in office, advised pt can continue buddy taping for 1-2 weeks If no improvement in pain or worsening pain in the next 1-2 weeks, we can consider xray at that time  I, Heidi Ferguson, PA-C have reviewed all documentation for this visit. The documentation  on  11/30/2021 for the exam, diagnosis, procedures, and orders are all accurate and complete.  Heidi Ferguson, PA-C [place] [address] [place], [place], [address] Office: [phone] Fax: [phone]

## 2021-12-05 NOTE — Addendum Note (Signed)
Addended by: Deirdre Evener on: 12/05/2021 04:34 PM   Modules accepted: Level of Service

## 2022-01-21 ENCOUNTER — Ambulatory Visit: Payer: Medicare HMO | Admitting: Family Medicine

## 2022-02-22 NOTE — Progress Notes (Unsigned)
I,Heidi Hernandez,acting as a Neurosurgeon for Heidi Merry, MD.,have documented all relevant documentation on the behalf of Heidi Merry, MD,as directed by  Heidi Merry, MD while in the presence of Heidi Merry, MD.   Established patient visit   Patient: Heidi Hernandez   DOB: [birth year]   71 y.o. Female  MRN: [id number] Visit Date: 02/23/2022  Today's healthcare provider: [name], MD   No chief complaint on file.  Subjective    HPI  -Reports hair is coming out and states she is having a hard time sleeping due to constantly having to wake during the night to urinate, but states bladder does not seem to completely empty when she voids, both at night and during the day. She does hydrate aggressively durigng the day, but stops drinking water around 6pm and typically goes to bed around 10pm.   Hypertension, follow-up  BP Readings from Last 3 Encounters:  11/30/21 (!) 154/74  11/09/21 (!) 141/61  10/28/21 126/90   Wt Readings from Last 3 Encounters:  11/30/21 130 lb 3.2 oz (59.1 kg)  11/09/21 128 lb (58.1 kg)  10/28/21 128 lb 8 oz (58.3 kg)     She was last seen for hypertension 5 months ago.  BP at that visit was 136/68. Management since that visit includes no changes.   She reports excellent compliance with treatment. She is not having side effects. She is following a Regular diet. She is exercising. She does not smoke.  Use of agents associated with hypertension: none.    Symptoms: No chest pain No chest pressure  No palpitations No syncope  No dyspnea No orthopnea  No paroxysmal nocturnal dyspnea Yes lower extremity edema   Pertinent labs Lab Results  Component Value Date   CHOL 155 01/15/2018   HDL 60 01/15/2018   LDLCALC 75 01/15/2018   TRIG 100 01/15/2018   CHOLHDL 2.6 01/15/2018   Lab Results  Component Value Date   NA 138 10/27/2021   K 3.6 10/27/2021   CREATININE 0.59 10/27/2021   GFRNONAA >60 10/27/2021   GLUCOSE 98 10/27/2021   TSH  1.740 06/04/2020      ---------------------------------------------------------------------------------------------------   Medications: Outpatient Medications Prior to Visit  Medication Sig   albuterol (VENTOLIN HFA) 108 (90 Base) MCG/ACT inhaler Inhale 2 puffs into the lungs every 6 (six) hours as needed for wheezing. May substitute generic or equivalent brand name   albuterol (VENTOLIN HFA) 108 (90 Base) MCG/ACT inhaler Inhale 2 puffs into the lungs every 6 (six) hours as needed for wheezing or shortness of breath.   alendronate (FOSAMAX) 70 MG tablet Take 1 tablet (70 mg total) by mouth once a week. Take with a full glass of water on an empty stomach.   amLODipine (NORVASC) 10 MG tablet Take 1 tablet (10 mg total) by mouth daily.   atenolol (TENORMIN) 25 MG tablet Take 1 tablet (25 mg total) by mouth daily.   azelastine (ASTELIN) 0.1 % nasal spray Place 2 sprays into both nostrils daily.    EPINEPHrine 0.3 mg/0.3 mL IJ SOAJ injection as needed.   Fexofenadine HCl (ALLEGRA PO) Take 1 capsule by mouth daily.   fluticasone (FLONASE) 50 MCG/ACT nasal spray Place 2 sprays into both nostrils daily.    fluticasone (FLOVENT HFA) 44 MCG/ACT inhaler Inhale 1 puff into the lungs daily.   methadone (DOLOPHINE) 5 MG tablet Take 5 mg by mouth daily.   montelukast (SINGULAIR) 10 MG tablet Take 10 mg by mouth at bedtime.  omeprazole (PRILOSEC) 40 MG capsule Take 40 mg by mouth daily.   ondansetron (ZOFRAN ODT) 4 MG disintegrating tablet Take 1 tablet (4 mg total) by mouth every 8 (eight) hours as needed for nausea or vomiting.   pramipexole (MIRAPEX) 0.5 MG tablet Take 1 tablet by mouth in the morning and at bedtime.   SUMAtriptan (IMITREX) 100 MG tablet TAKE 1 TABLET BY MOUTH AT ONSET OF HEADACHE. MAY TAKE AN ADDITIONAL TABLET IN 2 HOURS IF NEEDED. MAX OF 200mg /24hr   Vitamin D, Ergocalciferol, (DRISDOL) 1.25 MG (50000 UNIT) CAPS capsule Take 1 capsule (50,000 Units total) by mouth once a week.    No facility-administered medications prior to visit.    Review of Systems  Respiratory: Negative.  Negative for cough, shortness of breath and wheezing.   Cardiovascular:  Negative for chest pain, palpitations and leg swelling.  Neurological:  Negative for weakness and headaches.       Objective    BP (!) 152/64 (BP Location: Left Arm, Patient Position: Sitting, Cuff Size: Normal)   Pulse 64   Ht 5' 3.5" (1.613 m)   Wt 125 lb 6.4 oz (56.9 kg)   SpO2 100%   BMI 21.87 kg/m    Physical Exam    General: Appearance:    Well developed, well nourished female in no acute distress  Eyes:    PERRL, conjunctiva/corneas clear, EOM's intact       Lungs:     Clear to auscultation bilaterally, respirations unlabored  Heart:    Normal heart rate. Normal rhythm.  2/6 systolic murmur at right upper sternal border   MS:   All extremities are intact.    Neurologic:   Awake, alert, oriented x 3. No apparent focal neurological defect.        Results for orders placed or performed in visit on 02/23/22  POCT Urinalysis Dipstick  Result Value Ref Range   Color, UA     Clarity, UA     Glucose, UA Negative Negative   Bilirubin, UA negative    Ketones, UA negative    Spec Grav, UA 1.020 1.010 - 1.025   Blood, UA negative    pH, UA 6.0 5.0 - 8.0   Protein, UA Negative Negative   Urobilinogen, UA 0.2 0.2 or 1.0 E.U./dL   Nitrite, UA negative    Leukocytes, UA Negative Negative   Appearance     Odor       Assessment & Plan     1. Primary hypertension Usually much better controlled, Continue current medications for now.   2. Nocturia and Incomplete bladder emptying This is relatively recent development associated with sensation of not being able to completely empty her bladder when she does void. She is concerned this may be side effect of methadone which otherwise has worked 02/25/22 well for RLS, although dose her neurologist prescribed is quite low.   - Ambulatory referral to  Urology      The entirety of the information documented in the History of Present Illness, Review of Systems and Physical Exam were personally obtained by me. Portions of this information were initially documented by the CMA and reviewed by me for thoroughness and accuracy.     Academic librarian, MD  [facility] [phone] (phone) [phone] (fax)  [facility]

## 2022-02-23 ENCOUNTER — Ambulatory Visit (INDEPENDENT_AMBULATORY_CARE_PROVIDER_SITE_OTHER): Payer: Medicare HMO | Admitting: Family Medicine

## 2022-02-23 ENCOUNTER — Encounter: Payer: Self-pay | Admitting: Family Medicine

## 2022-02-23 VITALS — BP 152/64 | HR 64 | Ht 63.5 in | Wt 125.4 lb

## 2022-02-23 DIAGNOSIS — I10 Essential (primary) hypertension: Secondary | ICD-10-CM

## 2022-02-23 DIAGNOSIS — R351 Nocturia: Secondary | ICD-10-CM | POA: Diagnosis not present

## 2022-02-23 DIAGNOSIS — R339 Retention of urine, unspecified: Secondary | ICD-10-CM

## 2022-02-23 LAB — POCT URINALYSIS DIPSTICK
Bilirubin, UA: NEGATIVE
Blood, UA: NEGATIVE
Glucose, UA: NEGATIVE
Ketones, UA: NEGATIVE
Leukocytes, UA: NEGATIVE
Nitrite, UA: NEGATIVE
Protein, UA: NEGATIVE
Spec Grav, UA: 1.02 (ref 1.010–1.025)
Urobilinogen, UA: 0.2 E.U./dL
pH, UA: 6 (ref 5.0–8.0)

## 2022-02-24 DIAGNOSIS — R0789 Other chest pain: Secondary | ICD-10-CM | POA: Diagnosis not present

## 2022-02-24 DIAGNOSIS — K2100 Gastro-esophageal reflux disease with esophagitis, without bleeding: Secondary | ICD-10-CM | POA: Diagnosis not present

## 2022-02-24 DIAGNOSIS — R630 Anorexia: Secondary | ICD-10-CM | POA: Diagnosis not present

## 2022-02-24 DIAGNOSIS — R6881 Early satiety: Secondary | ICD-10-CM | POA: Diagnosis not present

## 2022-02-24 DIAGNOSIS — Z8719 Personal history of other diseases of the digestive system: Secondary | ICD-10-CM | POA: Diagnosis not present

## 2022-02-24 DIAGNOSIS — R69 Illness, unspecified: Secondary | ICD-10-CM | POA: Diagnosis not present

## 2022-03-02 ENCOUNTER — Ambulatory Visit: Payer: Medicare HMO | Admitting: Dermatology

## 2022-03-02 DIAGNOSIS — K2970 Gastritis, unspecified, without bleeding: Secondary | ICD-10-CM | POA: Diagnosis not present

## 2022-03-02 DIAGNOSIS — K2960 Other gastritis without bleeding: Secondary | ICD-10-CM | POA: Diagnosis not present

## 2022-03-02 DIAGNOSIS — K219 Gastro-esophageal reflux disease without esophagitis: Secondary | ICD-10-CM | POA: Diagnosis not present

## 2022-03-02 DIAGNOSIS — K2270 Barrett's esophagus without dysplasia: Secondary | ICD-10-CM | POA: Diagnosis not present

## 2022-03-02 DIAGNOSIS — K449 Diaphragmatic hernia without obstruction or gangrene: Secondary | ICD-10-CM | POA: Diagnosis not present

## 2022-03-02 DIAGNOSIS — K3189 Other diseases of stomach and duodenum: Secondary | ICD-10-CM | POA: Diagnosis not present

## 2022-03-08 ENCOUNTER — Encounter: Payer: Self-pay | Admitting: Urology

## 2022-03-08 ENCOUNTER — Ambulatory Visit (INDEPENDENT_AMBULATORY_CARE_PROVIDER_SITE_OTHER): Payer: Medicare HMO | Admitting: Urology

## 2022-03-08 VITALS — BP 147/66 | HR 65 | Ht 63.5 in | Wt 126.0 lb

## 2022-03-08 DIAGNOSIS — K5909 Other constipation: Secondary | ICD-10-CM

## 2022-03-08 DIAGNOSIS — R351 Nocturia: Secondary | ICD-10-CM | POA: Diagnosis not present

## 2022-03-08 DIAGNOSIS — R339 Retention of urine, unspecified: Secondary | ICD-10-CM | POA: Diagnosis not present

## 2022-03-08 LAB — BLADDER SCAN AMB NON-IMAGING

## 2022-03-08 NOTE — Patient Instructions (Signed)
Take Colace and mira lax as needed for chronic constipation.

## 2022-03-08 NOTE — Progress Notes (Signed)
03/08/2022 3:31 PM   Okey Regal Lockie Pares 04/20/1951 [id number]  Referring provider: Malva Limes, MD [address] [place],  [place] [address]  Chief Complaint  Patient presents with   Nocturia    HPI: 71 year old female who presents today for further evaluation of nocturia and sensation of incomplete bladder emptying.  She reports today that she was recently started on methadone for restless leg.  She thinks that is helpful but ever since starting this medication, she feels like she is not emptying her bladder all the way.  Her symptoms are only at nighttime.  She gets up 6 or 7 times at night to urinate.  When she does, she has to sit there and will void a second time 15 to 20 seconds after she initially voided but with a smaller amount.  She has been careful about limiting fluids after 6 PM when this for started happening.  She has no daytime symptoms including urgency frequency or incontinence.  No dysuria or gross hematuria.  Prior to starting methadone, she did not have this issue.  She has developed chronic drug-induced constipation.  She is taking a stool softener but only having a hard bowel movement every other day sometimes small pellet-like.  She adamantly denies snoring or concern for sleep apnea.  She is seeing a sleep medicine doctor who does not think that she has a sleep apnea component.  She is never had a sleep study.  Even if she did, she does not think that she would be able to tolerate a CPAP.  She has minimal lower extremity edema.  PVR today 0.  As part of further evaluation for this, she did have her urine checked on 02/23/2022 which was unremarkable.   PMH: Past Medical History:  Diagnosis Date   Anxiety    Asthma    GERD (gastroesophageal reflux disease)    Headache    Migraines   Hypertension    Idiopathic thrombocytopenia purpura (HCC)    Required splenectomy   MVP (mitral valve prolapse)    Neuromuscular disorder (HCC)    Restless  Leg Syndrome   Pancreatitis April 2017 [facility] due to cholecystitis.    Pneumonia    06-2015    Surgical History: Past Surgical History:  Procedure Laterality Date   ABDOMINAL HYSTERECTOMY     APPENDECTOMY     CARPAL TUNNEL RELEASE Bilateral 11/19/2014   Procedure: CARPAL TUNNEL RELEASE;  Surgeon: Donato Heinz, MD;  Location: [facility] ORS;  Service: Orthopedics;  Laterality: Bilateral;   CESAREAN SECTION     times 2   CHOLECYSTECTOMY  09/29/2015   [facility]   COLONOSCOPY N/A 05/02/2016   Procedure: COLONOSCOPY;  Surgeon: Scot Jun, MD;  Location: [facility]COPY;  Service: Endoscopy;  Laterality: N/A;   ESOPHAGOGASTRODUODENOSCOPY (EGD) WITH PROPOFOL N/A 05/02/2016   Procedure: ESOPHAGOGASTRODUODENOSCOPY (EGD) WITH PROPOFOL;  Surgeon: Scot Jun, MD;  Location: [facility] ENDOSCOPY;  Service: Endoscopy;  Laterality: N/A;   ESOPHAGOGASTRODUODENOSCOPY (EGD) WITH PROPOFOL N/A 11/22/2017   Procedure: ESOPHAGOGASTRODUODENOSCOPY (EGD) WITH PROPOFOL;  Surgeon: Scot Jun, MD;  Location: [facility] ENDOSCOPY;  Service: Endoscopy;  Laterality: N/A;   HERNIA REPAIR     times 3   JOINT REPLACEMENT     OVARIAN CYST SURGERY     SHOULDER ARTHROSCOPY WITH ROTATOR CUFF REPAIR     times 2   SPLENECTOMY, TOTAL     TOTAL SHOULDER ARTHROPLASTY Right 04/21/2017   Procedure: REVERSE SHOULDER ARTHROPLASTY;  Surgeon: Signa Kell,  MD;  Location: [facility] ORS;  Service: Orthopedics;  Laterality: Right;   TRIGGER FINGER RELEASE Right 11/19/2014   Procedure: RELEASE TRIGGER FINGER/A-1 PULLEY;  Surgeon: Donato Heinz, MD;  Location: [facility] ORS;  Service: Orthopedics;  Laterality: Right;   TRIGGER FINGER RELEASE Right 08/12/2015   Procedure: RELEASE TRIGGER LONG FINGER;  Surgeon: Donato Heinz, MD;  Location: [facility] ORS;  Service: Orthopedics;  Laterality: Right;    Home Medications:  Allergies as of 03/08/2022       Reactions   Augmentin [amoxicillin-pot Clavulanate] Nausea And Vomiting   Cat Hair Extract     Other reaction(s): Not available   Dog Epithelium Allergy Skin Test    Other reaction(s): Not available   Droperidol Other (See Comments)   "Locked jaw" Facial muscles locked "Locked jaw"   Molds & Smuts    Other reaction(s): Not available   Paroxetine Hcl Other (See Comments)   syncope Passed out    Tree Extract    Other reaction(s): Not available   Morphine Itching        Medication List        Accurate as of March 08, 2022  3:31 PM. If you have any questions, ask your nurse or doctor.          albuterol 108 (90 Base) MCG/ACT inhaler Commonly known as: Ventolin HFA Inhale 2 puffs into the lungs every 6 (six) hours as needed for wheezing. May substitute generic or equivalent brand name   albuterol 108 (90 Base) MCG/ACT inhaler Commonly known as: VENTOLIN HFA Inhale 2 puffs into the lungs every 6 (six) hours as needed for wheezing or shortness of breath.   alendronate 70 MG tablet Commonly known as: FOSAMAX Take 1 tablet (70 mg total) by mouth once a week. Take with a full glass of water on an empty stomach.   ALLEGRA PO Take 1 capsule by mouth daily.   amLODipine 10 MG tablet Commonly known as: NORVASC Take 1 tablet (10 mg total) by mouth daily.   atenolol 25 MG tablet Commonly known as: TENORMIN Take 1 tablet (25 mg total) by mouth daily.   azelastine 0.1 % nasal spray Commonly known as: ASTELIN Place 2 sprays into both nostrils daily.   EPINEPHrine 0.3 mg/0.3 mL Soaj injection Commonly known as: EPI-PEN as needed.   fluticasone 44 MCG/ACT inhaler Commonly known as: FLOVENT HFA Inhale 1 puff into the lungs daily.   fluticasone 50 MCG/ACT nasal spray Commonly known as: FLONASE Place 2 sprays into both nostrils daily.   methadone 5 MG tablet Commonly known as: DOLOPHINE Take 5 mg by mouth daily.   montelukast 10 MG tablet Commonly known as: SINGULAIR Take 10 mg by mouth at bedtime.   omeprazole 40 MG capsule Commonly known as:  PRILOSEC Take 40 mg by mouth daily.   ondansetron 4 MG disintegrating tablet Commonly known as: Zofran ODT Take 1 tablet (4 mg total) by mouth every 8 (eight) hours as needed for nausea or vomiting.   pramipexole 0.5 MG tablet Commonly known as: MIRAPEX Take 1 tablet by mouth in the morning and at bedtime.   SUMAtriptan 100 MG tablet Commonly known as: IMITREX TAKE 1 TABLET BY MOUTH AT ONSET OF HEADACHE. MAY TAKE AN ADDITIONAL TABLET IN 2 HOURS IF NEEDED. MAX OF 200mg /24hr   Vitamin D (Ergocalciferol) 1.25 MG (50000 UNIT) Caps capsule Commonly known as: DRISDOL Take 1 capsule (50,000 Units total) by mouth once a week.        Allergies:  Allergies  Allergen Reactions   Augmentin [Amoxicillin-Pot Clavulanate] Nausea And Vomiting   Cat Hair Extract     Other reaction(s): Not available   Dog Epithelium Allergy Skin Test     Other reaction(s): Not available   Droperidol Other (See Comments)    "Locked jaw" Facial muscles locked "Locked jaw"   Molds & Smuts     Other reaction(s): Not available   Paroxetine Hcl Other (See Comments)    syncope Passed out    Tree Extract     Other reaction(s): Not available   Morphine Itching    Family History: Family History  Problem Relation Age of Onset   Hypertension Mother    Renal Disease Father        ESRD with HD    Social History:  reports that she quit smoking about 42 years ago. Her smoking use included cigarettes. She has a 10.00 pack-year smoking history. She has never used smokeless tobacco. She reports that she does not currently use alcohol. She reports that she does not use drugs.   Physical Exam: BP (!) 147/66   Pulse 65   Ht 5' 3.5" (1.613 m)   Wt 126 lb (57.2 kg)   BMI 21.97 kg/m   Constitutional:  Alert and oriented, No acute distress. HEENT: [place] AT, moist mucus membranes.  Trachea midline, no masses. Neurologic: Grossly intact, no focal deficits, moving all 4 extremities. Psychiatric: Normal mood and  affect.  Laboratory Data: Lab Results  Component Value Date   WBC 10.0 10/27/2021   HGB 14.1 10/27/2021   HCT 40.6 10/27/2021   MCV 92.5 10/27/2021   PLT 150 10/27/2021    Lab Results  Component Value Date   CREATININE 0.59 10/27/2021    Assessment & Plan:    1. Nocturia We discussed the differential diagnosis for nighttime urinary symptoms only which could include undiagnosed sleep apnea, physiologic fluid redistribution, behavior related, lower extremity edema, etc.  Given that her symptoms coincide with the initiation of methadone which she takes around-the-clock, I do think that they must be related.  She developed chronic constipation, drug-induced around that same time and has been struggling to have regular bowel movements.  Other than the behavior modification already implemented, I have strongly recommended getting her bowels under control with a goal of having 1 well-formed stool daily.  I recommended a daily stool softener and titrate MiraLAX as needed.    If she fails to improve with this, would have her discuss this further with her sleep physician about whether or not she would benefit from a sleep study.  Lastly, if she fails to improve with regulation of her bowels, we discussed a voiding diary to help elucidate whether this is primary nocturia or more bladder irritation at nighttime.  There also may be a psychological component.  We did go ahead and give her hat today in case she does elect to proceed with a voiding diary down the road if she feels improved with the aforementioned.  All questions were answered.  Urinalysis today is negative, no concern for underlying pathology especially in the absence of daytime symptoms.  Lastly, her PVR was normal today.  I do agree with double voiding and Crede as needed at nighttime to ensure adequate bladder emptying. - Bladder Scan (Post Void Residual) in office  2. Incomplete bladder emptying As above  3. Chronic  constipation Above    Vanna Scotland, MD  [facility] [address] [place], [place]  [address] [phone]  

## 2022-03-22 DIAGNOSIS — R197 Diarrhea, unspecified: Secondary | ICD-10-CM | POA: Diagnosis not present

## 2022-03-22 DIAGNOSIS — R0989 Other specified symptoms and signs involving the circulatory and respiratory systems: Secondary | ICD-10-CM | POA: Diagnosis not present

## 2022-03-22 DIAGNOSIS — Z03818 Encounter for observation for suspected exposure to other biological agents ruled out: Secondary | ICD-10-CM | POA: Diagnosis not present

## 2022-03-31 DIAGNOSIS — Z79899 Other long term (current) drug therapy: Secondary | ICD-10-CM | POA: Diagnosis not present

## 2022-03-31 DIAGNOSIS — R351 Nocturia: Secondary | ICD-10-CM | POA: Diagnosis not present

## 2022-03-31 DIAGNOSIS — G4719 Other hypersomnia: Secondary | ICD-10-CM | POA: Diagnosis not present

## 2022-03-31 DIAGNOSIS — R69 Illness, unspecified: Secondary | ICD-10-CM | POA: Diagnosis not present

## 2022-03-31 DIAGNOSIS — R682 Dry mouth, unspecified: Secondary | ICD-10-CM | POA: Diagnosis not present

## 2022-03-31 DIAGNOSIS — G2581 Restless legs syndrome: Secondary | ICD-10-CM | POA: Diagnosis not present

## 2022-04-27 ENCOUNTER — Ambulatory Visit (INDEPENDENT_AMBULATORY_CARE_PROVIDER_SITE_OTHER): Payer: Medicare HMO | Admitting: Family Medicine

## 2022-04-27 ENCOUNTER — Encounter: Payer: Self-pay | Admitting: Family Medicine

## 2022-04-27 ENCOUNTER — Other Ambulatory Visit: Payer: Self-pay

## 2022-04-27 ENCOUNTER — Inpatient Hospital Stay: Payer: Medicare HMO | Attending: Oncology

## 2022-04-27 ENCOUNTER — Ambulatory Visit: Payer: Self-pay | Admitting: *Deleted

## 2022-04-27 VITALS — BP 157/72 | HR 57 | Temp 97.5°F | Resp 16 | Wt 126.6 lb

## 2022-04-27 DIAGNOSIS — G2581 Restless legs syndrome: Secondary | ICD-10-CM

## 2022-04-27 DIAGNOSIS — I10 Essential (primary) hypertension: Secondary | ICD-10-CM

## 2022-04-27 DIAGNOSIS — G4700 Insomnia, unspecified: Secondary | ICD-10-CM

## 2022-04-27 LAB — CBC WITH DIFFERENTIAL/PLATELET
Abs Immature Granulocytes: 0.07 10*3/uL (ref 0.00–0.07)
Basophils Absolute: 0.1 10*3/uL (ref 0.0–0.1)
Basophils Relative: 1 %
Eosinophils Absolute: 0.2 10*3/uL (ref 0.0–0.5)
Eosinophils Relative: 2 %
HCT: 39.4 % (ref 36.0–46.0)
Hemoglobin: 13.8 g/dL (ref 12.0–15.0)
Immature Granulocytes: 1 %
Lymphocytes Relative: 36 %
Lymphs Abs: 3.8 10*3/uL (ref 0.7–4.0)
MCH: 32.5 pg (ref 26.0–34.0)
MCHC: 35 g/dL (ref 30.0–36.0)
MCV: 92.7 fL (ref 80.0–100.0)
Monocytes Absolute: 0.9 10*3/uL (ref 0.1–1.0)
Monocytes Relative: 8 %
Neutro Abs: 5.7 10*3/uL (ref 1.7–7.7)
Neutrophils Relative %: 52 %
Platelets: 143 10*3/uL — ABNORMAL LOW (ref 150–400)
RBC: 4.25 MIL/uL (ref 3.87–5.11)
RDW: 13.8 % (ref 11.5–15.5)
Smear Review: NORMAL
WBC: 10.7 10*3/uL — ABNORMAL HIGH (ref 4.0–10.5)
nRBC: 0 % (ref 0.0–0.2)

## 2022-04-27 LAB — COMPREHENSIVE METABOLIC PANEL
ALT: 16 U/L (ref 0–44)
AST: 24 U/L (ref 15–41)
Albumin: 4.3 g/dL (ref 3.5–5.0)
Alkaline Phosphatase: 55 U/L (ref 38–126)
Anion gap: 5 (ref 5–15)
BUN: 18 mg/dL (ref 8–23)
CO2: 25 mmol/L (ref 22–32)
Calcium: 8.8 mg/dL — ABNORMAL LOW (ref 8.9–10.3)
Chloride: 108 mmol/L (ref 98–111)
Creatinine, Ser: 0.54 mg/dL (ref 0.44–1.00)
GFR, Estimated: >60 mL/min (ref >60)
Glucose, Bld: 100 mg/dL — ABNORMAL HIGH (ref 70–99)
Potassium: 3.8 mmol/L (ref 3.5–5.1)
Sodium: 138 mmol/L (ref 135–145)
Total Bilirubin: 0.3 mg/dL (ref 0.3–1.2)
Total Protein: 7.4 g/dL (ref 6.5–8.1)

## 2022-04-27 LAB — IRON AND TIBC
Iron: 89 ug/dL (ref 28–170)
Saturation Ratios: 27 % (ref 10.4–31.8)
TIBC: 326 ug/dL (ref 250–450)
UIBC: 237 ug/dL

## 2022-04-27 LAB — FERRITIN: Ferritin: 30 ng/mL (ref 11–307)

## 2022-04-27 MED ORDER — ACYCLOVIR 5 % EX OINT: 1.0000 | TOPICAL_OINTMENT | CUTANEOUS | 0 refills | Status: DC

## 2022-04-27 NOTE — Telephone Encounter (Signed)
  Chief Complaint: low BP reading Symptoms: weakness at night when BP drops Frequency: 1-2 weeks Pertinent Negatives: Patient denies   Disposition: [] ED /[] Urgent Care (no appt availability in office) / [x] Appointment(In office/virtual)/ []  [facility]/ [] Home Care/ [] Refused Recommended Disposition /[]  Mobile Bus/ []  Follow-up with PCP Additional Notes: Patient reports recently feeling weak out of energy at night- she has stared taking her BP and it is very low at night- pulse is low as well

## 2022-04-27 NOTE — Patient Instructions (Signed)
It was great to see you!  Our plans for today:  - Try reducing your afternoon pramipexole dose to 0.5 tablet and keep your whole tablet at bedtime.  - Wear compression socks during the day to help avoid swelling of your legs.  - Make a follow up appointment with urology.  - Follow up with neurology as scheduled.  - We will keep an eye out for the labs from your Oncologist.   Take care and seek immediate care sooner if you develop any concerns.   Dr. Ky Barban

## 2022-04-27 NOTE — Progress Notes (Signed)
   SUBJECTIVE:   CHIEF COMPLAINT / HPI:   Lower BP: - h/o HTN, on atenolol and amlodipine in am with good compliance. - h/o RLS on methadone in am, pramipexole at 4pm and at bedtime. - h/o nocturia, following with Urology. No improvement in nocturia with improvement in constipation. - having trouble with lower blood pressures in the evening over the past week.  - Checking BP at home: yes, 140s SBP in am, low 100s in pm. - Denies any SOB, CP, medication SE. - some fatigue in afternoon and evenings with associated lower BP.  - some LE edema throughout the day - reports RLS symptoms are well controlled on current regimen - eating and drinking ok - No dysuria. No hematuria.    OBJECTIVE:   BP (!) 157/72 (BP Location: Left Arm, Patient Position: Sitting, Cuff Size: Normal)   Pulse (!) 57   Temp (!) 97.5 F (36.4 C) (Oral)   Resp 16   Wt 126 lb 9.6 oz (57.4 kg)   SpO2 100%   BMI 22.07 kg/m   Gen: well appearing, in NAD Card: Reg rate Lungs: Comfortable WOB on RA Ext: WWP, trace b/l edema   ASSESSMENT/PLAN:   Essential hypertension With new orthostatic hypotension in evenings, new problem. Remains slightly borderline elevated in am and throughout the day. Likely multifactorial. Pramipexole dosing likely contributing given symptoms start shortly after afternoon dose. Also with LE edema throughout day, may be contributing to nocturia and lower pressures in evenings. UA previously collected unrevealing. Recommend decreasing afternoon pramipexole dose to 0.5 tablet and wearing compression socks throughout the day. Continue to follow with Urology for nocturia w/u. Hesitate to decrease antihypertensive regimen given borderline elevated pressures during day and previous readings of 180-200s SBP without medication. Obtaining labs with Oncology later today, will review results once available. Has sleep study and Neuro f/u already scheduled. Continue to monitor BP at home and f/u next month if  not improved.      Myles Gip, DO

## 2022-04-27 NOTE — Telephone Encounter (Signed)
Summary: low bp   Pt states in the last week her bp readings at night have been 107/56  116/59  108/58.  She says she getting weak at night , so much as she is finding it difficult to get ready for bed.  But during the day it is great.  Right now it is 143/65  but as the day goes on it goes it goes down.      Reason for Disposition  [5] Systolic BP 88-502 AND [7] taking blood pressure medications AND [3] dizzy, lightheaded or weak  Answer Assessment - Initial Assessment Questions 1. BLOOD PRESSURE: "What is the blood pressure?" "Did you take at least two measurements 5 minutes apart?"     144/64- drops during the day - night-107/56,116/59,108/58 2. ONSET: "When did you take your blood pressure?"     Am, last night starting at 9pm 3. HOW: "How did you obtain the blood pressure?" (e.g., visiting nurse, automatic home BP monitor)     Automatic cuff- arm 4. HISTORY: "Do you have a history of low blood pressure?" "What is your blood pressure normally?"     No- always high 5. MEDICINES: "Are you taking any medications for blood pressure?" If Yes, ask: "Have they been changed recently?"     Patient did have change in BP within the last year- takes BP medication in am 6. PULSE RATE: "Do you know what your pulse rate is?"      Last night- 54 7. OTHER SYMPTOMS: "Have you been sick recently?" "Have you had a recent injury?"     No- weight loss  Protocols used: Blood Pressure - Low-A-AH

## 2022-04-27 NOTE — Assessment & Plan Note (Signed)
With new orthostatic hypotension in evenings, new problem. Remains slightly borderline elevated in am and throughout the day. Likely multifactorial. Pramipexole dosing likely contributing given symptoms start shortly after afternoon dose. Also with LE edema throughout day, may be contributing to nocturia and lower pressures in evenings. UA previously collected unrevealing. Recommend decreasing afternoon pramipexole dose to 0.5 tablet and wearing compression socks throughout the day. Continue to follow with Urology for nocturia w/u. Hesitate to decrease antihypertensive regimen given borderline elevated pressures during day and previous readings of 180-200s SBP without medication. Obtaining labs with Oncology later today, will review results once available. Has sleep study and Neuro f/u already scheduled. Continue to monitor BP at home and f/u next month if not improved.

## 2022-04-28 ENCOUNTER — Inpatient Hospital Stay (HOSPITAL_BASED_OUTPATIENT_CLINIC_OR_DEPARTMENT_OTHER): Payer: Medicare HMO | Admitting: Oncology

## 2022-04-28 ENCOUNTER — Encounter: Payer: Self-pay | Admitting: Oncology

## 2022-04-28 NOTE — Addendum Note (Signed)
Addended by: Delice Bison E on: 04/28/2022 03:21 PM   Modules accepted: Orders

## 2022-04-28 NOTE — Progress Notes (Signed)
[facility]  [phone] [phone]  ID: Heidi Hernandez OB: 18-Sep-1950  MR#: [id number]  TXH#:[id number]  Patient Care Team: Malva Limes, MD as PCP - General (Family Medicine) Eileen Stanford, MD as Referring Physician (Allergy and Immunology) Sallee Lange, MD as Consulting Physician (Ophthalmology) Mickle Mallory (Gastroenterology) Idelle Crouch, MD as Referring Physician (Neurology) Morene Crocker, MD as Referring Physician (Neurology)  I connected with Heidi Hernandez on 04/28/22 at  2:45 PM EDT by video enabled telemedicine visit and verified that I am speaking with the correct person using two identifiers.   I discussed the limitations, risks, security and privacy concerns of performing an evaluation and management service by telemedicine and the availability of in-person appointments. I also discussed with the patient that there may be a patient responsible charge related to this service. The patient expressed understanding and agreed to proceed.   Other persons participating in the visit and their role in the encounter: Patient, MD.  Patient's location: Home. Provider's location: Clinic.  CHIEF COMPLAINT: [name]  INTERVAL HISTORY: Patient agreed to video assisted telemedicine visit for further evaluation, discussion of her laboratory results, and consideration of phlebotomy.  She reports difficulty with her blood pressure recently, but has started no new medications.  She otherwise feels well.  He has no neurologic complaints today. She denies any recent fevers or illnesses.  She has a good appetite and denies weight loss.  She has no chest pain, shortness of breath, cough, or hemoptysis.  She denies any nausea, vomiting, constipation, or diarrhea.  She has no urinary complaints.  Patient offers no further specific complaints today.  REVIEW OF SYSTEMS:   Review of Systems  Constitutional: Negative.   Negative for fever, malaise/fatigue and weight loss.  Respiratory:  Negative for cough, hemoptysis and shortness of breath.   Cardiovascular: Negative.  Negative for chest pain and leg swelling.  Gastrointestinal: Negative.  Negative for abdominal pain and heartburn.  Genitourinary: Negative.  Negative for dysuria.  Musculoskeletal: Negative.  Negative for back pain.  Skin: Negative.  Negative for rash.  Neurological: Negative.  Negative for dizziness, focal weakness, weakness and headaches.  Psychiatric/Behavioral: Negative.  The patient does not have insomnia.     As per HPI. Otherwise, a complete review of systems is negative.  PAST MEDICAL HISTORY: Past Medical History:  Diagnosis Date   Anxiety    Asthma    GERD (gastroesophageal reflux disease)    Headache    Migraines   Hypertension    Idiopathic thrombocytopenia purpura (HCC)    Required splenectomy   MVP (mitral valve prolapse)    Neuromuscular disorder (HCC)    Restless Leg Syndrome   Pancreatitis April 2017 [facility] due to cholecystitis.    Pneumonia    06-2015    PAST SURGICAL HISTORY: Past Surgical History:  Procedure Laterality Date   ABDOMINAL HYSTERECTOMY     APPENDECTOMY     CARPAL TUNNEL RELEASE Bilateral 11/19/2014   Procedure: CARPAL TUNNEL RELEASE;  Surgeon: Donato Heinz, MD;  Location: [facility] ORS;  Service: Orthopedics;  Laterality: Bilateral;   CESAREAN SECTION     times 2   CHOLECYSTECTOMY  09/29/2015   [facility]   COLONOSCOPY N/A 05/02/2016   Procedure: COLONOSCOPY;  Surgeon: Scot Jun, MD;  Location: [facility] ENDOSCOPY;  Service: Endoscopy;  Laterality: N/A;   ESOPHAGOGASTRODUODENOSCOPY (EGD) WITH PROPOFOL N/A 05/02/2016   Procedure: ESOPHAGOGASTRODUODENOSCOPY (EGD) WITH PROPOFOL;  Surgeon: Scot Jun, MD;  Location: [facility] ENDOSCOPY;  Service: Endoscopy;  Laterality: N/A;   ESOPHAGOGASTRODUODENOSCOPY (EGD) WITH PROPOFOL N/A 11/22/2017   Procedure: ESOPHAGOGASTRODUODENOSCOPY (EGD) WITH  PROPOFOL;  Surgeon: Manya Silvas, MD;  Location: [facility] ENDOSCOPY;  Service: Endoscopy;  Laterality: N/A;   HERNIA REPAIR     times 3   JOINT REPLACEMENT     OVARIAN CYST SURGERY     SHOULDER ARTHROSCOPY WITH ROTATOR CUFF REPAIR     times 2   SPLENECTOMY, TOTAL     TOTAL SHOULDER ARTHROPLASTY Right 04/21/2017   Procedure: REVERSE SHOULDER ARTHROPLASTY;  Surgeon: Leim Fabry, MD;  Location: [facility] ORS;  Service: Orthopedics;  Laterality: Right;   TRIGGER FINGER RELEASE Right 11/19/2014   Procedure: RELEASE TRIGGER FINGER/A-1 PULLEY;  Surgeon: Dereck Leep, MD;  Location: [facility] ORS;  Service: Orthopedics;  Laterality: Right;   TRIGGER FINGER RELEASE Right 08/12/2015   Procedure: RELEASE TRIGGER LONG FINGER;  Surgeon: Dereck Leep, MD;  Location: [facility] ORS;  Service: Orthopedics;  Laterality: Right;    FAMILY HISTORY: Family History  Problem Relation Age of Onset   Hypertension Mother    Renal Disease Father        ESRD with HD    ADVANCED DIRECTIVES (Y/N):  N  HEALTH MAINTENANCE: Social History   Tobacco Use   Smoking status: Former    Packs/day: 1.00    Years: 10.00    Total pack years: 10.00    Types: Cigarettes    Quit date: 11/10/1979    Years since quitting: 42.4   Smokeless tobacco: Never  Vaping Use   Vaping Use: Never used  Substance Use Topics   Alcohol use: Not Currently    Alcohol/week: 0.0 standard drinks of alcohol   Drug use: No     Colonoscopy:  PAP:  Bone density:  Lipid panel:  Allergies  Allergen Reactions   Augmentin [Amoxicillin-Pot Clavulanate] Nausea And Vomiting   Cat Hair Extract     Other reaction(s): Not available   Dog Epithelium Allergy Skin Test     Other reaction(s): Not available   Droperidol Other (See Comments)    "Locked jaw" Facial muscles locked "Locked jaw"   Molds & Smuts     Other reaction(s): Not available   Paroxetine Hcl Other (See Comments)    syncope Passed out    Tree Extract     Other reaction(s): Not  available   Morphine Itching    Current Outpatient Medications  Medication Sig Dispense Refill   acyclovir ointment (ZOVIRAX) 5 % Apply 1 Application topically every 3 (three) hours. 15 g 0   albuterol (VENTOLIN HFA) 108 (90 Base) MCG/ACT inhaler Inhale 2 puffs into the lungs every 6 (six) hours as needed for wheezing. May substitute generic or equivalent brand name 18 g 1   albuterol (VENTOLIN HFA) 108 (90 Base) MCG/ACT inhaler Inhale 2 puffs into the lungs every 6 (six) hours as needed for wheezing or shortness of breath. 8 g 2   alendronate (FOSAMAX) 70 MG tablet Take 1 tablet (70 mg total) by mouth once a week. Take with a full glass of water on an empty stomach. 12 tablet 4   amLODipine (NORVASC) 10 MG tablet Take 1 tablet (10 mg total) by mouth daily. 90 tablet 4   atenolol (TENORMIN) 25 MG tablet Take 1 tablet (25 mg total) by mouth daily. 90 tablet 4   azelastine (ASTELIN) 0.1 % nasal spray Place 2 sprays into both nostrils daily.      EPINEPHrine 0.3  mg/0.3 mL IJ SOAJ injection as needed.     Fexofenadine HCl (ALLEGRA PO) Take 1 capsule by mouth daily.     fluticasone (FLONASE) 50 MCG/ACT nasal spray Place 2 sprays into both nostrils daily.      fluticasone (FLOVENT HFA) 44 MCG/ACT inhaler Inhale 1 puff into the lungs daily.     methadone (DOLOPHINE) 5 MG tablet Take 5 mg by mouth daily.     montelukast (SINGULAIR) 10 MG tablet Take 10 mg by mouth at bedtime.      omeprazole (PRILOSEC) 40 MG capsule Take 40 mg by mouth daily.     ondansetron (ZOFRAN ODT) 4 MG disintegrating tablet Take 1 tablet (4 mg total) by mouth every 8 (eight) hours as needed for nausea or vomiting. 20 tablet 0   pramipexole (MIRAPEX) 0.5 MG tablet Take 1 tablet by mouth in the morning and at bedtime.     SUMAtriptan (IMITREX) 100 MG tablet TAKE 1 TABLET BY MOUTH AT ONSET OF HEADACHE. MAY TAKE AN ADDITIONAL TABLET IN 2 HOURS IF NEEDED. MAX OF 200mg /24hr 9 tablet 2   Vitamin D, Ergocalciferol, (DRISDOL) 1.25 MG  (50000 UNIT) CAPS capsule Take 1 capsule (50,000 Units total) by mouth once a week. 12 capsule 4   No current facility-administered medications for this visit.    OBJECTIVE: There were no vitals filed for this visit.    There is no height or weight on file to calculate BMI.    ECOG FS:0 - Asymptomatic  General: Well-developed, well-nourished, no acute distress. HEENT: Normocephalic. Neuro: Alert, answering all questions appropriately. Cranial nerves grossly intact. Psych: Normal affect.  LAB RESULTS:  Lab Results  Component Value Date   NA 138 04/27/2022   K 3.8 04/27/2022   CL 108 04/27/2022   CO2 25 04/27/2022   GLUCOSE 100 (H) 04/27/2022   BUN 18 04/27/2022   CREATININE 0.54 04/27/2022   CALCIUM 8.8 (L) 04/27/2022   PROT 7.4 04/27/2022   ALBUMIN 4.3 04/27/2022   AST 24 04/27/2022   ALT 16 04/27/2022   ALKPHOS 55 04/27/2022   BILITOT 0.3 04/27/2022   GFRNONAA >60 04/27/2022   GFRAA 99 06/04/2020    Lab Results  Component Value Date   WBC 10.7 (H) 04/27/2022   NEUTROABS 5.7 04/27/2022   HGB 13.8 04/27/2022   HCT 39.4 04/27/2022   MCV 92.7 04/27/2022   PLT 143 (L) 04/27/2022   Lab Results  Component Value Date   IRON 89 04/27/2022   TIBC 326 04/27/2022   IRONPCTSAT 27 04/27/2022   Lab Results  Component Value Date   FERRITIN 30 04/27/2022     STUDIES: No results found.  ASSESSMENT: [name].  PLAN:    1. [name]: Patient is homozygous.  Ferritin levels, iron saturation, and total iron are all within normal limits.  Patient's hemoglobin is 13.8.  She does not require phlebotomy at this time.  She last received treatment 6 months ago.  It appears she only requires phlebotomy once per year.  Return to clinic in 6 months with repeat laboratory work and video assisted telemedicine visit at which point can consider transitioning patient to laboratory work and evaluation once per year.   2.  Restless leg syndrome:  Significantly improved with low-dose methadone.  Continue monitoring and treatment per neurology.   3.  Thrombocytopenia: Mild, monitor. 4.  Heartburn: Patient does not complain of this today. 5.  Blood pressure: Patient reports her blood pressure has been erratic and is currently being evaluated  by primary care. 6.  Leukocytosis: Likely reactive.  Monitor.  I provided 20 minutes of face-to-face video visit time during this encounter which included chart review, counseling, and coordination of care as documented above.   Patient expressed understanding and was in agreement with this plan. She also understands that She can call clinic at any time with any questions, concerns, or complaints.    Jeralyn Ruths, MD   04/28/2022 3:10 PM

## 2022-05-02 ENCOUNTER — Ambulatory Visit: Payer: Self-pay | Admitting: *Deleted

## 2022-05-02 NOTE — Telephone Encounter (Signed)
  Chief Complaint: decreased heart rate Symptoms: fatigue, low pulse rate Frequency: ongoing 1 week Pertinent Negatives: Patient denies dizziness, chest pain, sweating, difficulty breathing  Disposition: [] ED /[] Urgent Care (no appt availability in office) / [x] Appointment(In office/virtual)/ []  [facility]/ [] Home Care/ [] Refused Recommended Disposition /[] [facility]/ []  Follow-up with PCP Additional Notes: Patient prefers to see PCP- scheduled first available-  Patient has been to neuro to discuss possible medication SE- they do not think it is her medication due to length she has been on it- they are recommending PCP f/u with EKG. Patient is calling to schedule with PCP- offered appointment today- but patient wants to see PCP and so she has scheduled am. Patient advised if she has any changes- she has to go to UC/ED- she agrees.

## 2022-05-02 NOTE — Progress Notes (Addendum)
I,Roshena L Chambers,acting as a scribe for Lelon Huh, MD.,have documented all relevant documentation on the behalf of Lelon Huh, MD,as directed by  Lelon Huh, MD while in the presence of Lelon Huh, MD.   Established patient visit   Patient: Heidi Hernandez   DOB: [birth year]   71 y.o. Female  MRN: [id number] Visit Date: 05/03/2022  Today's healthcare provider: Lelon Huh, MD   Chief Complaint  Patient presents with   Bradycardia   Fatigue   Subjective    HPI  Bradycardia: Patient reports having a decreased heart rate (below 60) and fatigue for the past week. Patient states she has been seen by neurology to discuss possible medication side effect- they do not think it is her medication due to length she has been on it- they are recommending PCP f/u with EKG.  States she has been much more fatigued than usual for usual for the last week. She was seen last week by Dr. Ky Barban and had normal Met C, CBC and iron levels. She does have to get up to void 5-6 times everynight for the last several months and has been followed by Dr. Erlene Quan. Denies any pain or burning when voiding. She does get short of breath more easily than usual, especially with exertion. She also has history of vitamin B12 and D deficiencies for which she is taking supplemental vitamins.    Medications: Outpatient Medications Prior to Visit  Medication Sig   acyclovir ointment (ZOVIRAX) 5 % Apply 1 Application topically every 3 (three) hours.   albuterol (VENTOLIN HFA) 108 (90 Base) MCG/ACT inhaler Inhale 2 puffs into the lungs every 6 (six) hours as needed for wheezing. May substitute generic or equivalent brand name   albuterol (VENTOLIN HFA) 108 (90 Base) MCG/ACT inhaler Inhale 2 puffs into the lungs every 6 (six) hours as needed for wheezing or shortness of breath.   alendronate (FOSAMAX) 70 MG tablet Take 1 tablet (70 mg total) by mouth once a week. Take with a full glass of water on an empty  stomach.   amLODipine (NORVASC) 10 MG tablet Take 1 tablet (10 mg total) by mouth daily.   atenolol (TENORMIN) 25 MG tablet Take 1 tablet (25 mg total) by mouth daily.   azelastine (ASTELIN) 0.1 % nasal spray Place 2 sprays into both nostrils daily.    EPINEPHrine 0.3 mg/0.3 mL IJ SOAJ injection as needed.   Fexofenadine HCl (ALLEGRA PO) Take 1 capsule by mouth daily.   fluticasone (FLONASE) 50 MCG/ACT nasal spray Place 2 sprays into both nostrils daily.    fluticasone (FLOVENT HFA) 44 MCG/ACT inhaler Inhale 1 puff into the lungs daily.   methadone (DOLOPHINE) 5 MG tablet Take 5 mg by mouth daily.   montelukast (SINGULAIR) 10 MG tablet Take 10 mg by mouth at bedtime.    omeprazole (PRILOSEC) 40 MG capsule Take 40 mg by mouth daily.   ondansetron (ZOFRAN ODT) 4 MG disintegrating tablet Take 1 tablet (4 mg total) by mouth every 8 (eight) hours as needed for nausea or vomiting.   pramipexole (MIRAPEX) 0.5 MG tablet Take 1 tablet by mouth in the morning and at bedtime.   SUMAtriptan (IMITREX) 100 MG tablet TAKE 1 TABLET BY MOUTH AT ONSET OF HEADACHE. MAY TAKE AN ADDITIONAL TABLET IN 2 HOURS IF NEEDED. MAX OF 236m/24hr   Vitamin D, Ergocalciferol, (DRISDOL) 1.25 MG (50000 UNIT) CAPS capsule Take 1 capsule (50,000 Units total) by mouth once a week.   No facility-administered medications  prior to visit.    Review of Systems  Constitutional:  Positive for fatigue. Negative for appetite change, chills and fever.  Respiratory:  Negative for chest tightness and shortness of breath.   Cardiovascular:  Negative for chest pain and palpitations.  Gastrointestinal:  Negative for abdominal pain, nausea and vomiting.  Neurological:  Negative for dizziness and weakness.       Objective    BP (!) 150/65 (BP Location: Right Arm, Patient Position: Sitting, Cuff Size: Normal)   Pulse (!) 58   Temp 98 F (36.7 C) (Oral)   Wt 125 lb (56.7 kg)   SpO2 100% Comment: room air  BMI 21.80 kg/m   Today's  Vitals   05/03/22 0821 05/03/22 0825  BP: (!) 155/68 (!) 150/65  Pulse: (!) 58 (!) 58  Temp: 98 F (36.7 C)   TempSrc: Oral   SpO2: 100%   Weight: 125 lb (56.7 kg)    Body mass index is 21.8 kg/m.   Physical Exam    General: Appearance:    Well developed, well nourished female in no acute distress  Eyes:    PERRL, conjunctiva/corneas clear, EOM's intact       Lungs:     Clear to auscultation bilaterally, respirations unlabored  Heart:    Bradycardic. Normal rhythm.  2/6 systolic murmur at right upper sternal border   MS:   All extremities are intact.    Neurologic:   Awake, alert, oriented x 3. No apparent focal neurological defect.        Results for orders placed or performed in visit on 05/03/22  POCT Urinalysis Dipstick  Result Value Ref Range   Color, UA yellow    Clarity, UA clear    Glucose, UA Negative Negative   Bilirubin, UA negative    Ketones, UA negative    Spec Grav, UA 1.015 1.010 - 1.025   Blood, UA trace (non hemolyzed)    pH, UA 6.0 5.0 - 8.0   Protein, UA Negative Negative   Urobilinogen, UA 0.2 0.2 or 1.0 E.U./dL   Nitrite, UA negative    Leukocytes, UA Negative Negative   Appearance     Odor      Assessment & Plan     1. Nocturia Normal u/a, has been followed by Dr. Erlene Quan. Is likely contributing although probably not primary cause of her fatigue.   2. Other fatigue Recent CBC and met C were unremarkable.  - TSH - T4, free  3. Bradycardia  - EKG 12-Lead  Borderline AV block on EKG likely secondary to betablocker. Is about at her baseline HR and unlikely to be responsible to worsening fatigue she has experiences over the last week.   4. Dyspnea on exertion  - Troponin T - Brain natriuretic peptide  5. Vitamin D deficiency  - VITAMIN D 25 Hydroxy (Vit-D Deficiency, Fractures)  6. B12 deficiency  - Vitamin B12      The entirety of the information documented in the History of Present Illness, Review of Systems and Physical  Exam were personally obtained by me. Portions of this information were initially documented by the CMA and reviewed by me for thoroughness and accuracy.     Lelon Huh, MD  [facility] [phone] (phone) [phone] (fax)  [place]

## 2022-05-02 NOTE — Telephone Encounter (Signed)
Reason for Disposition  Age > 60 years  (Exception: Brief heartbeat symptoms that went away and now feels well.)  Answer Assessment - Initial Assessment Questions 1. DESCRIPTION: "Please describe your heart rate or heartbeat that you are having" (e.g., fast/slow, regular/irregular, skipped or extra beats, "palpitations")     BP- high to low- pulse in low- 141/69,62 7am, 150/73,57-8am, 138/68,55-9am 2. ONSET: "When did it start?" (Minutes, hours or days)      Ongoing- since last week 3. DURATION: "How long does it last" (e.g., seconds, minutes, hours)       4. PATTERN "Does it come and go, or has it been constant since it started?"  "Does it get worse with exertion?"   "Are you feeling it now?"     Tends to stay pow 5. TAP: "Using your hand, can you tap out what you are feeling on a chair or table in front of you, so that I can hear?" (Note: not all patients can do this)         6. HEART RATE: "Can you tell me your heart rate?" "How many beats in 15 seconds?"  (Note: not all patients can do this)       57 7. RECURRENT SYMPTOM: "Have you ever had this before?" If Yes, ask: "When was the last time?" and "What happened that time?"      Yes- has always been low- but not this low 8. CAUSE: "What do you think is causing the palpitations?"     Medication not suspected by neurologist  9. CARDIAC HISTORY: "Do you have any history of heart disease?" (e.g., heart attack, angina, bypass surgery, angioplasty, arrhythmia)      Heart disease 10. OTHER SYMPTOMS: "Do you have any other symptoms?" (e.g., dizziness, chest pain, sweating, difficulty breathing)       fatigue  Protocols used: Heart Rate and Heartbeat Questions-A-AH

## 2022-05-02 NOTE — Telephone Encounter (Signed)
FYI

## 2022-05-03 ENCOUNTER — Encounter: Payer: Self-pay | Admitting: Family Medicine

## 2022-05-03 ENCOUNTER — Ambulatory Visit (INDEPENDENT_AMBULATORY_CARE_PROVIDER_SITE_OTHER): Payer: Medicare HMO | Admitting: Family Medicine

## 2022-05-03 VITALS — BP 150/65 | HR 58 | Temp 98.0°F | Wt 125.0 lb

## 2022-05-03 DIAGNOSIS — R001 Bradycardia, unspecified: Secondary | ICD-10-CM | POA: Diagnosis not present

## 2022-05-03 DIAGNOSIS — E538 Deficiency of other specified B group vitamins: Secondary | ICD-10-CM | POA: Diagnosis not present

## 2022-05-03 DIAGNOSIS — R5383 Other fatigue: Secondary | ICD-10-CM

## 2022-05-03 DIAGNOSIS — R0609 Other forms of dyspnea: Secondary | ICD-10-CM | POA: Diagnosis not present

## 2022-05-03 DIAGNOSIS — R351 Nocturia: Secondary | ICD-10-CM | POA: Diagnosis not present

## 2022-05-03 DIAGNOSIS — E559 Vitamin D deficiency, unspecified: Secondary | ICD-10-CM

## 2022-05-03 LAB — POCT URINALYSIS DIPSTICK
Bilirubin, UA: NEGATIVE
Glucose, UA: NEGATIVE
Ketones, UA: NEGATIVE
Leukocytes, UA: NEGATIVE
Nitrite, UA: NEGATIVE
Protein, UA: NEGATIVE
Spec Grav, UA: 1.015 (ref 1.010–1.025)
Urobilinogen, UA: 0.2 E.U./dL
pH, UA: 6 (ref 5.0–8.0)

## 2022-05-06 LAB — VITAMIN D 25 HYDROXY (VIT D DEFICIENCY, FRACTURES): Vit D, 25-Hydroxy: 43 ng/mL (ref 30.0–100.0)

## 2022-05-06 LAB — TSH: TSH: 1.51 u[IU]/mL (ref 0.450–4.500)

## 2022-05-06 LAB — T4, FREE: Free T4: 1.22 ng/dL (ref 0.82–1.77)

## 2022-05-06 LAB — VITAMIN B12: Vitamin B-12: 372 pg/mL (ref 232–1245)

## 2022-05-06 LAB — TROPONIN T: Troponin T (Highly Sensitive): 11 ng/L (ref 0–14)

## 2022-05-06 LAB — BRAIN NATRIURETIC PEPTIDE: BNP: 42.7 pg/mL (ref 0.0–100.0)

## 2022-05-08 ENCOUNTER — Other Ambulatory Visit: Payer: Self-pay | Admitting: Family Medicine

## 2022-05-08 DIAGNOSIS — I77810 Thoracic aortic ectasia: Secondary | ICD-10-CM

## 2022-05-13 ENCOUNTER — Telehealth: Payer: Self-pay | Admitting: Family Medicine

## 2022-05-13 NOTE — Telephone Encounter (Signed)
Dawn with [facility] is calling in for assistance. Pt has been referred to have [facility]- Dawn states that pt also has [employer], that is requiring/ need authorization.    Dawn would like to have this completed so that she can schedule pt.   Phone: [phone] ext [phone]

## 2022-05-17 ENCOUNTER — Ambulatory Visit
Admission: RE | Admit: 2022-05-17 | Discharge: 2022-05-17 | Disposition: A | Discharge status: Home / Self Care | Payer: Medicare HMO | Source: Ambulatory Visit | Attending: Family Medicine | Admitting: Family Medicine

## 2022-05-17 ENCOUNTER — Other Ambulatory Visit: Payer: Self-pay | Admitting: Family Medicine

## 2022-05-17 DIAGNOSIS — I77810 Thoracic aortic ectasia: Secondary | ICD-10-CM | POA: Diagnosis not present

## 2022-05-17 MED ORDER — IOHEXOL 350 MG/ML SOLN
75.0000 mL | Freq: Once | INTRAVENOUS | Status: AC | PRN
  Administered 2022-05-17: 75 mL via INTRAVENOUS

## 2022-05-18 NOTE — Telephone Encounter (Signed)
Copied from CRM [id number]. Topic: General - Other >> May 18, 2022  8:05 AM Heidi Hernandez wrote: Pt requesting a cb to discuss 11-21 ct scan results  Pt states she is concern of the results she received via mychart  Please fu w/pt

## 2022-05-18 NOTE — Telephone Encounter (Signed)
Please advise CT scan results?

## 2022-05-21 ENCOUNTER — Other Ambulatory Visit: Payer: Self-pay | Admitting: Family Medicine

## 2022-05-21 DIAGNOSIS — Q790 Congenital diaphragmatic hernia: Secondary | ICD-10-CM

## 2022-05-21 NOTE — Telephone Encounter (Signed)
Thoracic aorta is slightly enlarged, but has not grown at all since 2020 and should never cause any problems.  She does have a rare type of hernia called a Bochdalek's hernia. These rarely cause problems, but sometimes require surgery. She needs to be followed by surgery and will place referral order.

## 2022-05-23 MED ORDER — ONDANSETRON 4 MG PO TBDP: 4.0000 mg | ORAL_TABLET | Freq: Three times a day (TID) | ORAL | 0 refills | Status: DC | PRN

## 2022-05-25 ENCOUNTER — Ambulatory Visit (INDEPENDENT_AMBULATORY_CARE_PROVIDER_SITE_OTHER): Payer: Medicare HMO | Admitting: Surgery

## 2022-05-25 ENCOUNTER — Other Ambulatory Visit: Payer: Self-pay

## 2022-05-25 ENCOUNTER — Telehealth: Payer: Self-pay

## 2022-05-25 ENCOUNTER — Encounter: Payer: Self-pay | Admitting: Surgery

## 2022-05-25 ENCOUNTER — Ambulatory Visit: Payer: Medicare HMO | Admitting: Surgery

## 2022-05-25 VITALS — BP 155/75 | HR 58 | Temp 97.9°F | Ht 64.0 in | Wt 122.0 lb

## 2022-05-25 DIAGNOSIS — K449 Diaphragmatic hernia without obstruction or gangrene: Secondary | ICD-10-CM | POA: Diagnosis not present

## 2022-05-25 NOTE — Telephone Encounter (Signed)
Cardiac clearance faxed to Dr.Paraschos.

## 2022-05-25 NOTE — Progress Notes (Unsigned)
Outpatient Surgical Follow Up  05/25/2022  Heidi Hernandez is an 71 y.o. female.   Chief Complaint  Patient presents with   New Patient (Initial Visit)    Bochdaleks hernia    HPI: Heer is a 71 year old female seen for a right sided Bochdalek hernia.  This was discover after CT of the chest was done for surveillance of her ectatic thoracic aorta. The scan personally reviewed and there is evidence of approximately 2- 3 cm right posterior and medial diaphragmatic hernia with some fat herniation.  There is evidence of mildly ectatic ascending aorta She endorses no dyspnea on exertion.  She does have some reflux symptoms but I do not think they are related. prior Open splenectomy for ITP.  Platelets are better in the 140s.  No evidence of easy bruising or easy bleeding. History of cholecystectomy and umbilical hernia repair. C section x 2 and abdominal hysterectomy. SHe is otherwise in good health and is able to perform more than 4 METS of activity without any shortness of breath or chest pain.  She does have mitral valve prolapse. SHe also has a history of hemochromatosis some residual mild thrombocytopenia.  Her platelet count last time was 143,003 weeks ago, hemoglobin 13.8 CMP was completely normal  Past Medical History:  Diagnosis Date   Anxiety    Asthma    GERD (gastroesophageal reflux disease)    Headache    Migraines   Hypertension    Idiopathic thrombocytopenia purpura (HCC)    Required splenectomy   MVP (mitral valve prolapse)    Neuromuscular disorder (HCC)    Restless Leg Syndrome   Pancreatitis April 2017 [facility] due to cholecystitis.    Pneumonia    06-2015    Past Surgical History:  Procedure Laterality Date   ABDOMINAL HYSTERECTOMY     APPENDECTOMY     CARPAL TUNNEL RELEASE Bilateral 11/19/2014   Procedure: CARPAL TUNNEL RELEASE;  Surgeon: Donato Heinz, MD;  Location: [facility] ORS;  Service: Orthopedics;  Laterality: Bilateral;   CESAREAN SECTION      times 2   CHOLECYSTECTOMY  09/29/2015   [facility]   COLONOSCOPY N/A 05/02/2016   Procedure: COLONOSCOPY;  Surgeon: Scot Jun, MD;  Location: [facility] ENDOSCOPY;  Service: Endoscopy;  Laterality: N/A;   ESOPHAGOGASTRODUODENOSCOPY (EGD) WITH PROPOFOL N/A 05/02/2016   Procedure: ESOPHAGOGASTRODUODENOSCOPY (EGD) WITH PROPOFOL;  Surgeon: Scot Jun, MD;  Location: [facility] ENDOSCOPY;  Service: Endoscopy;  Laterality: N/A;   ESOPHAGOGASTRODUODENOSCOPY (EGD) WITH PROPOFOL N/A 11/22/2017   Procedure: ESOPHAGOGASTRODUODENOSCOPY (EGD) WITH PROPOFOL;  Surgeon: Scot Jun, MD;  Location: [facility] ENDOSCOPY;  Service: Endoscopy;  Laterality: N/A;   HERNIA REPAIR     times 3   JOINT REPLACEMENT     OVARIAN CYST SURGERY     SHOULDER ARTHROSCOPY WITH ROTATOR CUFF REPAIR     times 2   SPLENECTOMY, TOTAL     TOTAL SHOULDER ARTHROPLASTY Right 04/21/2017   Procedure: REVERSE SHOULDER ARTHROPLASTY;  Surgeon: Signa Kell, MD;  Location: [facility] ORS;  Service: Orthopedics;  Laterality: Right;   TRIGGER FINGER RELEASE Right 11/19/2014   Procedure: RELEASE TRIGGER FINGER/A-1 PULLEY;  Surgeon: Donato Heinz, MD;  Location: [facility] ORS;  Service: Orthopedics;  Laterality: Right;   TRIGGER FINGER RELEASE Right 08/12/2015   Procedure: RELEASE TRIGGER LONG FINGER;  Surgeon: Donato Heinz, MD;  Location: [facility] ORS;  Service: Orthopedics;  Laterality: Right;    Family History  Problem Relation Age of Onset   Hypertension Mother  Renal Disease Father        ESRD with HD    Social History:  reports that she quit smoking about 42 years ago. Her smoking use included cigarettes. She has a 10.00 pack-year smoking history. She has never used smokeless tobacco. She reports that she does not currently use alcohol. She reports that she does not use drugs.  Allergies:  Allergies  Allergen Reactions   Augmentin [Amoxicillin-Pot Clavulanate] Nausea And Vomiting   Cat Hair Extract     Other reaction(s): Not available   Dog  Epithelium Allergy Skin Test     Other reaction(s): Not available   Droperidol Other (See Comments)    "Locked jaw" Facial muscles locked "Locked jaw"   Molds & Smuts     Other reaction(s): Not available   Paroxetine Hcl Other (See Comments)    syncope Passed out    Tree Extract     Other reaction(s): Not available   Morphine Itching    Medications reviewed.    ROS Full ROS performed and is otherwise negative other than what is stated in HPI   BP (!) 155/75   Pulse (!) 58   Temp 97.9 F (36.6 C) (Oral)   Ht 5\' 4"  (1.626 m)   Wt 122 lb (55.3 kg)   SpO2 97%   BMI 20.94 kg/m   Physical Exam Vitals and nursing note reviewed. Exam conducted with a chaperone present.  Constitutional:      Appearance: Normal appearance. She is normal weight. She is not ill-appearing or diaphoretic.  Eyes:     General: No scleral icterus.       Right eye: No discharge.        Left eye: No discharge.  Cardiovascular:     Rate and Rhythm: Normal rate and regular rhythm.     Heart sounds: Murmur heard.     No friction rub. No gallop.  Pulmonary:     Effort: Pulmonary effort is normal. No respiratory distress.     Breath sounds: Normal breath sounds. No stridor. No wheezing or rhonchi.  Abdominal:     General: Abdomen is flat. There is no distension.     Palpations: Abdomen is soft. There is no mass.     Tenderness: There is no abdominal tenderness. There is no guarding or rebound.     Hernia: No hernia is present.  Musculoskeletal:        General: No swelling or tenderness. Normal range of motion.     Cervical back: Normal range of motion and neck supple. No rigidity or tenderness.  Lymphadenopathy:     Cervical: No cervical adenopathy.  Skin:    General: Skin is warm and dry.     Capillary Refill: Capillary refill takes less than 2 seconds.  Neurological:     General: No focal deficit present.     Mental Status: She is alert and oriented to person, place, and time.  Psychiatric:         Mood and Affect: Mood normal.        Behavior: Behavior normal.        Thought Content: Thought content normal.        Judgment: Judgment normal.    Assessment/Plan: 71 year old female with a right-sided Bochdalek or postural medial diaphragmatic hernia.  I had an extensive discussion with the patient.  I do not think that she is symptomatic at this time.  Fortunately there is no good data long-term about management of Bochdalek hernias but  classic Luanna Cole states that you probably should fix those defects because if there was evidence of incarceration or strangulation's usually complications are catastrophic.  An extensive discussion with the patient regarding my thought process and I was very transparent.  The reason that we will be doing repair is to prevent potential disastrous complications. Given her mitral valve prolapse we will make sure that we will obtain preoperative cardiac optimization. Given her thrombocytopenia we will probably do some type and cross.  I do not think that she necessarily needs perioperative blood transfusion. Please Note that I have spent 60 minutes's encounter including personally reviewing imaging studies, placing orders, coordinating her care, and providing appropriate documentation   Caroleen Hamman, MD [place] Surgeon

## 2022-05-25 NOTE — Patient Instructions (Addendum)
Our surgery scheduler will call you within 24-48 hours to schedule your surgery. Please have the Blue surgery sheet available when speaking with her.   Please call Dr.Paraschos office to see if you need to be seen.   Please see your follow up appointment listed below.

## 2022-05-26 ENCOUNTER — Encounter: Payer: Self-pay | Admitting: Surgery

## 2022-05-26 ENCOUNTER — Telehealth: Payer: Self-pay | Admitting: Surgery

## 2022-05-26 NOTE — Telephone Encounter (Signed)
Patient is scheduled for robotic right diaphragmatic hernia repair on 07/05/22 with Dr. Everlene Farrier.  She forgot to mention yesterday while in the office that about two years ago at [facility] she had right shoulder replacement done and normally needs to be premedicated prior to procedures.  She is asking if needs to be premedicated prior to her surgery?  Please call her. Thank you.

## 2022-05-26 NOTE — Telephone Encounter (Signed)
Patient has been advised of Pre-Admission date/time, and Surgery date at [facility].  Surgery Date: 07/05/22 Preadmission Testing Date: 06/29/22 (phone 8a-1p)  Patient has been made aware to call [phone], between 1-3:00pm the day before surgery, to find out what time to arrive for surgery.

## 2022-05-30 ENCOUNTER — Telehealth: Payer: Self-pay

## 2022-05-30 NOTE — Telephone Encounter (Signed)
Received cardiac clearance from Dr. Darrold Junker. Pt's risk assessment is low and is optimized for surgery.

## 2022-06-01 ENCOUNTER — Ambulatory Visit (INDEPENDENT_AMBULATORY_CARE_PROVIDER_SITE_OTHER): Payer: Medicare HMO

## 2022-06-01 VITALS — Ht 64.0 in | Wt 122.0 lb

## 2022-06-01 DIAGNOSIS — Z0000 Encounter for general adult medical examination without abnormal findings: Secondary | ICD-10-CM | POA: Diagnosis not present

## 2022-06-01 NOTE — Progress Notes (Signed)
Virtual Visit via Telephone Note  I connected with  Heidi Hernandez on 06/01/22 at  9:30 AM EST by telephone and verified that I am speaking with the correct person using two identifiers.  Location: Patient: home Provider: [facility] Persons participating in the virtual visit: patient/Nurse Health Advisor   I discussed the limitations, risks, security and privacy concerns of performing an evaluation and management service by telephone and the availability of in person appointments. The patient expressed understanding and agreed to proceed.  Interactive audio and video telecommunications were attempted between this nurse and patient, however failed, due to patient having technical difficulties OR patient did not have access to video capability.  We continued and completed visit with audio only.  Some vital signs may be absent or patient reported.   Hal HopeLorrie S Saide Lanuza, LPN  Subjective:   Heidi PaoCarol M Hernandez is a 71 y.o. female who presents for Medicare Annual (Subsequent) preventive examination.  Review of Systems     Cardiac Risk Factors include: advanced age (>2755men, 39>65 women);hypertension     Objective:    Today's Vitals   06/01/22 0951  Weight: 122 lb (55.3 kg)  Height: 5\' 4"  (1.626 m)   Body mass index is 20.94 kg/m.     06/01/2022    9:38 AM 04/28/2022    2:09 PM 10/28/2021    2:00 PM 05/31/2021    9:47 AM 06/21/2020    3:20 PM 03/25/2020    2:26 PM 03/16/2020    3:00 PM  Advanced Directives  Does Patient Have a Medical Advance Directive? No No No No No No No  Would patient like information on creating a medical advance directive? No - Patient declined No - Patient declined No - Patient declined No - Patient declined No - Patient declined No - Patient declined No - Patient declined    Current Medications (verified) Outpatient Encounter Medications as of 06/01/2022  Medication Sig   albuterol (VENTOLIN HFA) 108 (90 Base) MCG/ACT inhaler Inhale 2 puffs into the lungs every 6  (six) hours as needed for wheezing. May substitute generic or equivalent brand name   alendronate (FOSAMAX) 70 MG tablet Take 1 tablet (70 mg total) by mouth once a week. Take with a full glass of water on an empty stomach.   amLODipine (NORVASC) 10 MG tablet Take 1 tablet (10 mg total) by mouth daily.   atenolol (TENORMIN) 25 MG tablet Take 1 tablet (25 mg total) by mouth daily.   azelastine (ASTELIN) 0.1 % nasal spray Place 2 sprays into both nostrils daily.    EPINEPHrine 0.3 mg/0.3 mL IJ SOAJ injection as needed.   Fexofenadine HCl (ALLEGRA PO) Take 1 capsule by mouth daily.   fluticasone (FLONASE) 50 MCG/ACT nasal spray Place 2 sprays into both nostrils daily.    fluticasone (FLOVENT HFA) 44 MCG/ACT inhaler Inhale 1 puff into the lungs daily.   methadone (DOLOPHINE) 5 MG tablet Take 5 mg by mouth daily.   montelukast (SINGULAIR) 10 MG tablet Take 10 mg by mouth at bedtime.    omeprazole (PRILOSEC) 40 MG capsule Take 40 mg by mouth daily.   ondansetron (ZOFRAN ODT) 4 MG disintegrating tablet Take 1 tablet (4 mg total) by mouth every 8 (eight) hours as needed for nausea or vomiting.   pramipexole (MIRAPEX) 0.5 MG tablet Take 1 tablet by mouth in the morning and at bedtime.   SUMAtriptan (IMITREX) 100 MG tablet TAKE 1 TABLET BY MOUTH AT ONSET OF HEADACHE. MAY TAKE AN ADDITIONAL TABLET IN  2 HOURS IF NEEDED. MAX OF /24hr   Vitamin D, Ergocalciferol, (DRISDOL) 1.25 MG (50000 UNIT) CAPS capsule Take 1 capsule (50,000 Units total) by mouth once a week.   acyclovir ointment (ZOVIRAX) 5 % Apply 1 Application topically every 3 (three) hours. (Patient not taking: Reported on 06/01/2022)   [DISCONTINUED] albuterol (VENTOLIN HFA) 108 (90 Base) MCG/ACT inhaler Inhale 2 puffs into the lungs every 6 (six) hours as needed for wheezing or shortness of breath.   [DISCONTINUED] ondansetron (ZOFRAN ODT) 4 MG disintegrating tablet Take 1 tablet (4 mg total) by mouth every 8 (eight) hours as needed for nausea or  vomiting.   No facility-administered encounter medications on file as of 06/01/2022.    Allergies (verified) Augmentin [amoxicillin-pot clavulanate], Cat hair extract, Dog epithelium allergy skin test, Droperidol, Molds & smuts, Paroxetine hcl, Tree extract, and Morphine   History: Past Medical History:  Diagnosis Date   Anxiety    Asthma    GERD (gastroesophageal reflux disease)    Headache    Migraines   Hypertension    Idiopathic thrombocytopenia purpura (HCC)    Required splenectomy   MVP (mitral valve prolapse)    Neuromuscular disorder (HCC)    Restless Leg Syndrome   Pancreatitis April 2017 [facility] due to cholecystitis.    Pneumonia    06-2015   Past Surgical History:  Procedure Laterality Date   ABDOMINAL HYSTERECTOMY     APPENDECTOMY     CARPAL TUNNEL RELEASE Bilateral 11/19/2014   Procedure: CARPAL TUNNEL RELEASE;  Surgeon: Donato Heinz, MD;  Location: [facility] ORS;  Service: Orthopedics;  Laterality: Bilateral;   CESAREAN SECTION     times 2   CHOLECYSTECTOMY  09/29/2015   [facility]   COLONOSCOPY N/A 05/02/2016   Procedure: COLONOSCOPY;  Surgeon: Scot Jun, MD;  Location: [facility] ENDOSCOPY;  Service: Endoscopy;  Laterality: N/A;   ESOPHAGOGASTRODUODENOSCOPY (EGD) WITH PROPOFOL N/A 05/02/2016   Procedure: ESOPHAGOGASTRODUODENOSCOPY (EGD) WITH PROPOFOL;  Surgeon: Scot Jun, MD;  Location: [facility] ENDOSCOPY;  Service: Endoscopy;  Laterality: N/A;   ESOPHAGOGASTRODUODENOSCOPY (EGD) WITH PROPOFOL N/A 11/22/2017   Procedure: ESOPHAGOGASTRODUODENOSCOPY (EGD) WITH PROPOFOL;  Surgeon: Scot Jun, MD;  Location: [facility] ENDOSCOPY;  Service: Endoscopy;  Laterality: N/A;   HERNIA REPAIR     times 3   JOINT REPLACEMENT     OVARIAN CYST SURGERY     SHOULDER ARTHROSCOPY WITH ROTATOR CUFF REPAIR     times 2   SPLENECTOMY, TOTAL     TOTAL SHOULDER ARTHROPLASTY Right 04/21/2017   Procedure: REVERSE SHOULDER ARTHROPLASTY;  Surgeon: Signa Kell, MD;  Location: [facility] ORS;   Service: Orthopedics;  Laterality: Right;   TRIGGER FINGER RELEASE Right 11/19/2014   Procedure: RELEASE TRIGGER FINGER/A-1 PULLEY;  Surgeon: Donato Heinz, MD;  Location: [facility] ORS;  Service: Orthopedics;  Laterality: Right;   TRIGGER FINGER RELEASE Right 08/12/2015   Procedure: RELEASE TRIGGER LONG FINGER;  Surgeon: Donato Heinz, MD;  Location: [facility] ORS;  Service: Orthopedics;  Laterality: Right;   Family History  Problem Relation Age of Onset   Hypertension Mother    Renal Disease Father        ESRD with HD   Social History   Socioeconomic History   Marital status: Married    Spouse name: Not on file   Number of children: 2   Years of education: Not on file   Highest education level: Bachelor's degree (e.g., BA, AB, BS)  Occupational History   Occupation: Retired  Tobacco Use   Smoking status: Former    Packs/day: 1.00    Years: 10.00    Total pack years: 10.00    Types: Cigarettes    Quit date: 11/10/1979    Years since quitting: 42.5   Smokeless tobacco: Never  Vaping Use   Vaping Use: Never used  Substance and Sexual Activity   Alcohol use: Not Currently    Alcohol/week: 0.0 standard drinks of alcohol   Drug use: No   Sexual activity: Never  Other Topics Concern   Not on file  Social History Narrative   Not on file   Social Determinants of Health   Financial Resource Strain: Low Risk  (06/01/2022)   Overall Financial Resource Strain (CARDIA)    Difficulty of Paying Living Expenses: Not hard at all  Food Insecurity: No Food Insecurity (06/01/2022)   Hunger Vital Sign    Worried About Running Out of Food in the Last Year: Never true    Ran Out of Food in the Last Year: Never true  Transportation Needs: No Transportation Needs (06/01/2022)   PRAPARE - Administrator, Civil Service (Medical): No    Lack of Transportation (Non-Medical): No  Physical Activity: Sufficiently Active (06/01/2022)   Exercise Vital Sign    Days of Exercise per Week: 5 days     Minutes of Exercise per Session: 30 min  Stress: No Stress Concern Present (06/01/2022)   [employer] of Occupational Health - Occupational Stress Questionnaire    Feeling of Stress : Only a little  Social Connections: Moderately Integrated (06/01/2022)   Social Connection and Isolation Panel [NHANES]    Frequency of Communication with Friends and Family: More than three times a week    Frequency of Social Gatherings with Friends and Family: Twice a week    Attends Religious Services: More than 4 times per year    Active Member of [employer] or Organizations: No    Attends Engineer, structural: Never    Marital Status: Married    Tobacco Counseling Counseling given: Not Answered   Clinical Intake:  Pre-visit preparation completed: Yes  Pain : No/denies pain     Nutritional Risks: None Diabetes: No  How often do you need to have someone help you when you read instructions, pamphlets, or other written materials from your doctor or pharmacy?: 1 - Never  Diabetic?no  Interpreter Needed?: No  Information entered by :: Kennedy Bucker, LPN   Activities of Daily Living    06/01/2022    9:39 AM 04/27/2022   11:50 AM  In your present state of health, do you have any difficulty performing the following activities:  Hearing? 0 0  Vision? 0 0  Difficulty concentrating or making decisions? 0 0  Walking or climbing stairs? 0 0  Dressing or bathing? 0 0  Doing errands, shopping? 0 0  Preparing Food and eating ? N   Using the Toilet? N   In the past six months, have you accidently leaked urine? N   Do you have problems with loss of bowel control? N   Managing your Medications? N   Managing your Finances? N   Housekeeping or managing your Housekeeping? N     Patient Care Team: Malva Limes, MD as PCP - General (Family Medicine) Eileen Stanford, MD as Referring Physician (Allergy and Immunology) Sallee Lange, MD as Consulting Physician (Ophthalmology) Mickle Mallory (Gastroenterology) Idelle Crouch, MD as Referring Physician (Neurology) Morene Crocker, MD  as Referring Physician (Neurology)  Indicate any recent Medical Services you may have received from other than [facility] providers in the past year (date may be approximate).     Assessment:   This is a routine wellness examination for Heidi Hernandez.  Hearing/Vision screen Hearing Screening - Comments:: No aids Vision Screening - Comments:: Wears glasses- [facility]  Dietary issues and exercise activities discussed: Current Exercise Habits: Home exercise routine, Type of exercise: walking, Time (Minutes): 30, Frequency (Times/Week): 5, Weekly Exercise (Minutes/Week): 150, Intensity: Mild   Goals Addressed             This Visit's Progress    DIET - EAT MORE FRUITS AND VEGETABLES         Depression Screen    06/01/2022    9:37 AM 04/27/2022   11:50 AM 05/31/2021    9:48 AM 05/31/2021    9:45 AM 06/05/2020    4:50 PM 03/16/2020    2:56 PM 03/12/2019    2:07 PM  PHQ 2/9 Scores  PHQ - 2 Score 0 0 0 0 0 0 0  PHQ- 9 Score 0 3         Fall Risk    06/01/2022    9:39 AM 05/25/2022    1:25 PM 04/27/2022   11:50 AM 05/31/2021    9:48 AM 03/16/2020    3:00 PM  Fall Risk   Falls in the past year? 0 0 0 0 0  Number falls in past yr: 0  0 0 0  Injury with Fall? 0  0 0 0  Risk for fall due to : No Fall Risks  No Fall Risks No Fall Risks   Follow up Falls prevention discussed;Falls evaluation completed  Falls evaluation completed Falls prevention discussed     FALL RISK PREVENTION PERTAINING TO THE HOME:  Any stairs in or around the home? Yes  If so, are there any without handrails? No  Home free of loose throw rugs in walkways, pet beds, electrical cords, etc? Yes  Adequate lighting in your home to reduce risk of falls? Yes   ASSISTIVE DEVICES UTILIZED TO PREVENT FALLS:  Life alert? No  Use of a cane, walker or w/c? No  Grab bars in the bathroom? No  Shower chair or bench  in shower? Yes  Elevated toilet seat or a handicapped toilet? No     Cognitive Function:        06/01/2022    9:51 AM 03/16/2020    3:06 PM 03/12/2019    2:10 PM 01/12/2017    1:47 PM 01/12/2017    1:46 PM  6CIT Screen  What Year? 0 points 0 points 0 points 0 points 0 points  What month? 0 points 0 points 0 points 0 points 0 points  What time? 0 points 0 points 0 points 0 points 0 points  Count back from 20 0 points 0 points 0 points 0 points 0 points  Months in reverse 0 points 0 points 0 points 0 points   Repeat phrase 0 points 0 points 0 points 2 points   Total Score 0 points 0 points 0 points 2 points     Immunizations Immunization History  Administered Date(s) Administered   Fluad Quad(high Dose 65+) 03/29/2019, 06/03/2020, 06/25/2021   HIB (PRP-T) 10/19/2016   Influenza, High Dose Seasonal PF 06/16/2020, 06/29/2021, 07/16/2021   Meningococcal B, OMV 10/19/2016   Meningococcal Mcv4o 10/19/2016   PFIZER(Purple Top)SARS-COV-2 Vaccination 08/18/2019, 09/10/2019, 06/16/2020   Pneumococcal Conjugate-13 10/19/2016  Pneumococcal Polysaccharide-23 04/14/2009, 04/22/2015, 04/01/2016, 06/09/2017, 01/15/2018, 06/01/2018, 06/16/2020, 06/29/2021, 07/16/2021   Tdap 05/17/2011    TDAP status: Due, Education has been provided regarding the importance of this vaccine. Advised may receive this vaccine at local pharmacy or Health Dept. Aware to provide a copy of the vaccination record if obtained from local pharmacy or Health Dept. Verbalized acceptance and understanding.  Flu Vaccine status: Up to date  Pneumococcal vaccine status: Up to date  Covid-19 vaccine status: Completed vaccines  Qualifies for Shingles Vaccine? Yes   Zostavax completed No   Shingrix Completed?: Yes  Screening Tests Health Maintenance  Topic Date Due   Meningococcal B Vaccine (1 of 4 - Increased Risk) 12/02/1960   Hepatitis C Screening  Never done   Zoster Vaccines- Shingrix (1 of 2) Never done    DTaP/Tdap/Td (2 - Td or Tdap) 05/16/2021   COVID-19 Vaccine (4 - 2023-24 season) 02/25/2022   INFLUENZA VACCINE  09/25/2022 (Originally 01/25/2022)   MAMMOGRAM  07/22/2022   Medicare Annual Wellness (AWV)  06/02/2023   DEXA SCAN  07/23/2023   COLONOSCOPY (Pts 45-39yrs Insurance coverage will need to be confirmed)  05/02/2026   Pneumonia Vaccine 54+ Years old  Completed   HPV VACCINES  Aged Out    Health Maintenance  Health Maintenance Due  Topic Date Due   Meningococcal B Vaccine (1 of 4 - Increased Risk) 12/02/1960   Hepatitis C Screening  Never done   Zoster Vaccines- Shingrix (1 of 2) Never done   DTaP/Tdap/Td (2 - Td or Tdap) 05/16/2021   COVID-19 Vaccine (4 - 2023-24 season) 02/25/2022    Colorectal cancer screening: Type of screening: Colonoscopy. Completed 05/02/16. Repeat every 10 years  Mammogram status: Completed 07/28/21. Repeat every year  Bone Density status: Completed 07/22/21. Results reflect: Bone density results: OSTEOPOROSIS. Repeat every 2 years.  Lung Cancer Screening: (Low Dose CT Chest recommended if Age 52-80 years, 30 pack-year currently smoking OR have quit w/in 15years.) does not qualify.   Additional Screening:  Hepatitis C Screening: does qualify; Completed no  Vision Screening: Recommended annual ophthalmology exams for early detection of glaucoma and other disorders of the eye. Is the patient up to date with their annual eye exam?  Yes  Who is the provider or what is the name of the office in which the patient attends annual eye exams? [facility] If pt is not established with a provider, would they like to be referred to a provider to establish care? No .   Dental Screening: Recommended annual dental exams for proper oral hygiene  Community Resource Referral / Chronic Care Management: CRR required this visit?  No   CCM required this visit?  No      Plan:     I have personally reviewed and noted the following in the patient's chart:    Medical and social history Use of alcohol, tobacco or illicit drugs  Current medications and supplements including opioid prescriptions. Patient is currently taking opioid prescriptions. Information provided to patient regarding non-opioid alternatives. Patient advised to discuss non-opioid treatment plan with their provider. Functional ability and status Nutritional status Physical activity Advanced directives List of other physicians Hospitalizations, surgeries, and ER visits in previous 12 months Vitals Screenings to include cognitive, depression, and falls Referrals and appointments  In addition, I have reviewed and discussed with patient certain preventive protocols, quality metrics, and best practice recommendations. A written personalized care plan for preventive services as well as general preventive health recommendations were provided to  patient.     Hal Hope, LPN   09/01/9442   Nurse Notes: CT of aorta normal, but they found hernia (diaphragm) having surgery 07/05/21 to fix

## 2022-06-01 NOTE — Patient Instructions (Signed)
Heidi Hernandez , Thank you for taking time to come for your Medicare Wellness Visit. I appreciate your ongoing commitment to your health goals. Please review the following plan we discussed and let me know if I can assist you in the future.   Screening recommendations/referrals: Colonoscopy: 05/02/16 Mammogram: 07/28/21 Bone Density: 07/22/21 Recommended yearly ophthalmology/optometry visit for glaucoma screening and checkup Recommended yearly dental visit for hygiene and checkup  Vaccinations: Influenza vaccine: 07/16/21 Pneumococcal vaccine: 01/15/18 Tdap vaccine: 05/17/11, due if have injury Shingles vaccine: n/d   Covid-19:08/18/19, 09/10/19, 06/16/20  Advanced directives: no  Conditions/risks identified: none  Next appointment: Follow up in one year for your annual wellness visit 06/05/23 @ 1 pm by phone   Preventive Care 65 Years and Older, Female Preventive care refers to lifestyle choices and visits with your health care provider that can promote health and wellness. What does preventive care include? A yearly physical exam. This is also called an annual well check. Dental exams once or twice a year. Routine eye exams. Ask your health care provider how often you should have your eyes checked. Personal lifestyle choices, including: Daily care of your teeth and gums. Regular physical activity. Eating a healthy diet. Avoiding tobacco and drug use. Limiting alcohol use. Practicing safe sex. Taking low-dose aspirin every day. Taking vitamin and mineral supplements as recommended by your health care provider. What happens during an annual well check? The services and screenings done by your health care provider during your annual well check will depend on your age, overall health, lifestyle risk factors, and family history of disease. Counseling  Your health care provider may ask you questions about your: Alcohol use. Tobacco use. Drug use. Emotional well-being. Home and  relationship well-being. Sexual activity. Eating habits. History of falls. Memory and ability to understand (cognition). Work and work Astronomer. Reproductive health. Screening  You may have the following tests or measurements: Height, weight, and BMI. Blood pressure. Lipid and cholesterol levels. These may be checked every 5 years, or more frequently if you are over 59 years old. Skin check. Lung cancer screening. You may have this screening every year starting at age 18 if you have a 30-pack-year history of smoking and currently smoke or have quit within the past 15 years. Fecal occult blood test (FOBT) of the stool. You may have this test every year starting at age 44. Flexible sigmoidoscopy or colonoscopy. You may have a sigmoidoscopy every 5 years or a colonoscopy every 10 years starting at age 20. Hepatitis C blood test. Hepatitis B blood test. Sexually transmitted disease (STD) testing. Diabetes screening. This is done by checking your blood sugar (glucose) after you have not eaten for a while (fasting). You may have this done every 1-3 years. Bone density scan. This is done to screen for osteoporosis. You may have this done starting at age 56. Mammogram. This may be done every 1-2 years. Talk to your health care provider about how often you should have regular mammograms. Talk with your health care provider about your test results, treatment options, and if necessary, the need for more tests. Vaccines  Your health care provider may recommend certain vaccines, such as: Influenza vaccine. This is recommended every year. Tetanus, diphtheria, and acellular pertussis (Tdap, Td) vaccine. You may need a Td booster every 10 years. Zoster vaccine. You may need this after age 70. Pneumococcal 13-valent conjugate (PCV13) vaccine. One dose is recommended after age 59. Pneumococcal polysaccharide (PPSV23) vaccine. One dose is recommended after age 79. Talk  to your health care provider  about which screenings and vaccines you need and how often you need them. This information is not intended to replace advice given to you by your health care provider. Make sure you discuss any questions you have with your health care provider. Document Released: 07/10/2015 Document Revised: 03/02/2016 Document Reviewed: 04/14/2015 Elsevier Interactive Patient Education  2017 [employer] Prevention in the Home Falls can cause injuries. They can happen to people of all ages. There are many things you can do to make your home safe and to help prevent falls. What can I do on the outside of my home? Regularly fix the edges of walkways and driveways and fix any cracks. Remove anything that might make you trip as you walk through a door, such as a raised step or threshold. Trim any bushes or trees on the path to your home. Use bright outdoor lighting. Clear any walking paths of anything that might make someone trip, such as rocks or tools. Regularly check to see if handrails are loose or broken. Make sure that both sides of any steps have handrails. Any raised decks and porches should have guardrails on the edges. Have any leaves, snow, or ice cleared regularly. Use sand or salt on walking paths during winter. Clean up any spills in your garage right away. This includes oil or grease spills. What can I do in the bathroom? Use night lights. Install grab bars by the toilet and in the tub and shower. Do not use towel bars as grab bars. Use non-skid mats or decals in the tub or shower. If you need to sit down in the shower, use a plastic, non-slip stool. Keep the floor dry. Clean up any water that spills on the floor as soon as it happens. Remove soap buildup in the tub or shower regularly. Attach bath mats securely with double-sided non-slip rug tape. Do not have throw rugs and other things on the floor that can make you trip. What can I do in the bedroom? Use night lights. Make sure  that you have a light by your bed that is easy to reach. Do not use any sheets or blankets that are too big for your bed. They should not hang down onto the floor. Have a firm chair that has side arms. You can use this for support while you get dressed. Do not have throw rugs and other things on the floor that can make you trip. What can I do in the kitchen? Clean up any spills right away. Avoid walking on wet floors. Keep items that you use a lot in easy-to-reach places. If you need to reach something above you, use a strong step stool that has a grab bar. Keep electrical cords out of the way. Do not use floor polish or wax that makes floors slippery. If you must use wax, use non-skid floor wax. Do not have throw rugs and other things on the floor that can make you trip. What can I do with my stairs? Do not leave any items on the stairs. Make sure that there are handrails on both sides of the stairs and use them. Fix handrails that are broken or loose. Make sure that handrails are as long as the stairways. Check any carpeting to make sure that it is firmly attached to the stairs. Fix any carpet that is loose or worn. Avoid having throw rugs at the top or bottom of the stairs. If you do have throw rugs,  attach them to the floor with carpet tape. Make sure that you have a light switch at the top of the stairs and the bottom of the stairs. If you do not have them, ask someone to add them for you. What else can I do to help prevent falls? Wear shoes that: Do not have high heels. Have rubber bottoms. Are comfortable and fit you well. Are closed at the toe. Do not wear sandals. If you use a stepladder: Make sure that it is fully opened. Do not climb a closed stepladder. Make sure that both sides of the stepladder are locked into place. Ask someone to hold it for you, if possible. Clearly mark and make sure that you can see: Any grab bars or handrails. First and last steps. Where the edge of  each step is. Use tools that help you move around (mobility aids) if they are needed. These include: Canes. Walkers. Scooters. Crutches. Turn on the lights when you go into a dark area. Replace any light bulbs as soon as they burn out. Set up your furniture so you have a clear path. Avoid moving your furniture around. If any of your floors are uneven, fix them. If there are any pets around you, be aware of where they are. Review your medicines with your doctor. Some medicines can make you feel dizzy. This can increase your chance of falling. Ask your doctor what other things that you can do to help prevent falls. This information is not intended to replace advice given to you by your health care provider. Make sure you discuss any questions you have with your health care provider. Document Released: 04/09/2009 Document Revised: 11/19/2015 Document Reviewed: 07/18/2014 Elsevier Interactive Patient Education  2017 [employer].

## 2022-06-03 DIAGNOSIS — M65321 Trigger finger, right index finger: Secondary | ICD-10-CM | POA: Diagnosis not present

## 2022-06-06 ENCOUNTER — Ambulatory Visit (INDEPENDENT_AMBULATORY_CARE_PROVIDER_SITE_OTHER): Payer: Medicare HMO | Admitting: Dermatology

## 2022-06-06 DIAGNOSIS — L720 Epidermal cyst: Secondary | ICD-10-CM | POA: Diagnosis not present

## 2022-06-06 DIAGNOSIS — L820 Inflamed seborrheic keratosis: Secondary | ICD-10-CM

## 2022-06-06 NOTE — Progress Notes (Signed)
   Follow-Up Visit   Subjective  Heidi Hernandez is a 71 y.o. female who presents for the following: Follow-up (ISK follow up of right pretibial treated with LN2 - resolved) and Cyst (Recheck cyst of left paranasal).  The following portions of the chart were reviewed this encounter and updated as appropriate:   Tobacco  Allergies  Meds  Problems  Med Hx  Surg Hx  Fam Hx     Review of Systems:  No other skin or systemic complaints except as noted in HPI or Assessment and Plan.  Objective  Well appearing patient in no apparent distress; mood and affect are within normal limits.  A focused examination was performed including face, right leg. Relevant physical exam findings are noted in the Assessment and Plan.  Right pretibial Erythematous stuck-on, waxy papule or plaque  Left paranasal Subcutaneous nodule.    Assessment & Plan  Inflamed seborrheic keratosis Right pretibial  Destruction of lesion - Right pretibial Complexity: simple   Destruction method: cryotherapy   Informed consent: discussed and consent obtained   Timeout:  patient name, date of birth, surgical site, and procedure verified Lesion destroyed using liquid nitrogen: Yes   Region frozen until ice ball extended beyond lesion: Yes   Outcome: patient tolerated procedure well with no complications   Post-procedure details: wound care instructions given    Epidermal inclusion cyst Left paranasal  Skin excision - Left paranasal  Lesion length (cm):  0.3 Lesion width (cm):  0.3 Margin per side (cm):  0 Total excision diameter (cm):  0.3 Informed consent: discussed and consent obtained   Timeout: patient name, date of birth, surgical site, and procedure verified   Procedure prep:  Patient was prepped and draped in usual sterile fashion Prep type:  Isopropyl alcohol and povidone-iodine Anesthesia: the lesion was anesthetized in a standard fashion   Anesthetic:  1% lidocaine w/ epinephrine 1-100,000  buffered w/ 8.4% NaHCO3 Instrument used: #15 blade   Hemostasis achieved with: pressure   Hemostasis achieved with comment:  Electrocautery Outcome: patient tolerated procedure well with no complications   Post-procedure details: sterile dressing applied and wound care instructions given   Dressing type: bandage and pressure dressing (mupirocin)     Return if symptoms worsen or fail to improve.  I, Joanie Coddington, CMA, am acting as scribe for Armida Sans, MD . Documentation: I have reviewed the above documentation for accuracy and completeness, and I agree with the above.  Armida Sans, MD

## 2022-06-06 NOTE — Patient Instructions (Signed)
Cryotherapy Aftercare  Wash gently with soap and water everyday.   Apply Vaseline and Band-Aid daily until healed.  Wound Care Instructions  Cleanse wound gently with soap and water once a day then pat dry with clean gauze. Apply a thin coat of Petrolatum (petroleum jelly, "Vaseline") over the wound (unless you have an allergy to this). We recommend that you use a new, sterile tube of Vaseline. Do not pick or remove scabs. Do not remove the yellow or white "healing tissue" from the base of the wound.  Cover the wound with fresh, clean, nonstick gauze and secure with paper tape. You may use Band-Aids in place of gauze and tape if the wound is small enough, but would recommend trimming much of the tape off as there is often too much. Sometimes Band-Aids can irritate the skin.  You should call the office for your biopsy report after 1 week if you have not already been contacted.  If you experience any problems, such as abnormal amounts of bleeding, swelling, significant bruising, significant pain, or evidence of infection, please call the office immediately.  FOR ADULT SURGERY PATIENTS: If you need something for pain relief you may take 1 extra strength Tylenol (acetaminophen) AND 2 Ibuprofen (200mg each) together every 4 hours as needed for pain. (do not take these if you are allergic to them or if you have a reason you should not take them.) Typically, you may only need pain medication for 1 to 3 days.      Due to recent changes in healthcare laws, you may see results of your pathology and/or laboratory studies on MyChart before the doctors have had a chance to review them. We understand that in some cases there may be results that are confusing or concerning to you. Please understand that not all results are received at the same time and often the doctors may need to interpret multiple results in order to provide you with the best plan of care or course of treatment. Therefore, we ask that you  please give us 2 business days to thoroughly review all your results before contacting the office for clarification. Should we see a critical lab result, you will be contacted sooner.   If You Need Anything After Your Visit  If you have any questions or concerns for your doctor, please call our main line at [phone] and press option 4 to reach your doctor's medical assistant. If no one answers, please leave a voicemail as directed and we will return your call as soon as possible. Messages left after 4 pm will be answered the following business day.   You may also send us a message via MyChart. We typically respond to MyChart messages within 1-2 business days.  For prescription refills, please ask your pharmacy to contact our office. Our fax number is [phone].  If you have an urgent issue when the clinic is closed that cannot wait until the next business day, you can page your doctor at the number below.    Please note that while we do our best to be available for urgent issues outside of office hours, we are not available 24/7.   If you have an urgent issue and are unable to reach us, you may choose to seek medical care at your doctor's office, retail clinic, urgent care center, or emergency room.  If you have a medical emergency, please immediately call 911 or go to the emergency department.  Pager Numbers  - Dr. Kowalski: [phone]  -   Dr. Moye: [phone]  - Dr. Stewart: [phone]  In the event of inclement weather, please call our main line at [phone] for an update on the status of any delays or closures.  Dermatology Medication Tips: Please keep the boxes that topical medications come in in order to help keep track of the instructions about where and how to use these. Pharmacies typically print the medication instructions only on the boxes and not directly on the medication tubes.   If your medication is too expensive, please contact our office at  [phone] option 4 or send us a message through MyChart.   We are unable to tell what your co-pay for medications will be in advance as this is different depending on your insurance coverage. However, we may be able to find a substitute medication at lower cost or fill out paperwork to get insurance to cover a needed medication.   If a prior authorization is required to get your medication covered by your insurance company, please allow us 1-2 business days to complete this process.  Drug prices often vary depending on where the prescription is filled and some pharmacies may offer cheaper prices.  The website www.goodrx.com contains coupons for medications through different pharmacies. The prices here do not account for what the cost may be with help from insurance (it may be cheaper with your insurance), but the website can give you the price if you did not use any insurance.  - You can print the associated coupon and take it with your prescription to the pharmacy.  - You may also stop by our office during regular business hours and pick up a GoodRx coupon card.  - If you need your prescription sent electronically to a different pharmacy, notify our office through [facility] MyChart or by phone at [phone] option 4.     Si Usted Necesita Algo Despus de Su Visita  Tambin puede enviarnos un mensaje a travs de MyChart. Por lo general respondemos a los mensajes de MyChart en el transcurso de 1 a 2 das hbiles.  Para renovar recetas, por favor pida a su farmacia que se ponga en contacto con nuestra oficina. Nuestro nmero de fax es el [phone].  Si tiene un asunto urgente cuando la clnica est cerrada y que no puede esperar hasta el siguiente da hbil, puede llamar/localizar a su doctor(a) al nmero que aparece a continuacin.   Por favor, tenga en cuenta que aunque hacemos todo lo posible para estar disponibles para asuntos urgentes fuera del horario de oficina, no estamos  disponibles las 24 horas del da, los 7 das de la semana.   Si tiene un problema urgente y no puede comunicarse con nosotros, puede optar por buscar atencin mdica  en el consultorio de su doctor(a), en una clnica privada, en un centro de atencin urgente o en una sala de emergencias.  Si tiene una emergencia mdica, por favor llame inmediatamente al 911 o vaya a la sala de emergencias.  Nmeros de bper  - Dr. Kowalski: [phone]  - Dra. Moye: [phone]  - Dra. Stewart: [phone]  En caso de inclemencias del tiempo, por favor llame a nuestra lnea principal al [phone] para una actualizacin sobre el estado de cualquier retraso o cierre.  Consejos para la medicacin en dermatologa: Por favor, guarde las cajas en las que vienen los medicamentos de uso tpico para ayudarle a seguir las instrucciones sobre dnde y cmo usarlos. Las farmacias generalmente imprimen las instrucciones del medicamento slo en las cajas y   no directamente en los tubos del medicamento.   Si su medicamento es muy caro, por favor, pngase en contacto con nuestra oficina llamando al [phone] y presione la opcin 4 o envenos un mensaje a travs de MyChart.   No podemos decirle cul ser su copago por los medicamentos por adelantado ya que esto es diferente dependiendo de la cobertura de su seguro. Sin embargo, es posible que podamos encontrar un medicamento sustituto a menor costo o llenar un formulario para que el seguro cubra el medicamento que se considera necesario.   Si se requiere una autorizacin previa para que su compaa de seguros cubra su medicamento, por favor permtanos de 1 a 2 das hbiles para completar este proceso.  Los precios de los medicamentos varan con frecuencia dependiendo del lugar de dnde se surte la receta y alguna farmacias pueden ofrecer precios ms baratos.  El sitio web www.goodrx.com tiene cupones para medicamentos de diferentes farmacias. Los precios aqu no  tienen en cuenta lo que podra costar con la ayuda del seguro (puede ser ms barato con su seguro), pero el sitio web puede darle el precio si no utiliz ningn seguro.  - Puede imprimir el cupn correspondiente y llevarlo con su receta a la farmacia.  - Tambin puede pasar por nuestra oficina durante el horario de atencin regular y recoger una tarjeta de cupones de GoodRx.  - Si necesita que su receta se enve electrnicamente a una farmacia diferente, informe a nuestra oficina a travs de MyChart de [place] o por telfono llamando al [phone] y presione la opcin 4.  

## 2022-06-10 DIAGNOSIS — I341 Nonrheumatic mitral (valve) prolapse: Secondary | ICD-10-CM | POA: Diagnosis not present

## 2022-06-10 DIAGNOSIS — Z23 Encounter for immunization: Secondary | ICD-10-CM | POA: Diagnosis not present

## 2022-06-10 DIAGNOSIS — Z01810 Encounter for preprocedural cardiovascular examination: Secondary | ICD-10-CM | POA: Diagnosis not present

## 2022-06-10 DIAGNOSIS — Z01818 Encounter for other preprocedural examination: Secondary | ICD-10-CM | POA: Diagnosis not present

## 2022-06-10 DIAGNOSIS — I10 Essential (primary) hypertension: Secondary | ICD-10-CM | POA: Diagnosis not present

## 2022-06-14 ENCOUNTER — Encounter: Payer: Self-pay | Admitting: Dermatology

## 2022-06-15 ENCOUNTER — Encounter: Payer: Self-pay | Admitting: Surgery

## 2022-06-15 ENCOUNTER — Ambulatory Visit (INDEPENDENT_AMBULATORY_CARE_PROVIDER_SITE_OTHER): Payer: Medicare HMO | Admitting: Surgery

## 2022-06-15 ENCOUNTER — Other Ambulatory Visit: Payer: Self-pay

## 2022-06-15 VITALS — BP 130/83 | HR 99 | Temp 97.9°F | Ht 64.0 in | Wt 126.0 lb

## 2022-06-15 DIAGNOSIS — K449 Diaphragmatic hernia without obstruction or gangrene: Secondary | ICD-10-CM | POA: Diagnosis not present

## 2022-06-15 NOTE — Patient Instructions (Signed)
Call the number on the [facility] surgery sheet the day before your surgery to get your arrival time.

## 2022-06-15 NOTE — H&P (View-Only) (Signed)
Outpatient Surgical Follow Up  06/15/2022  Heidi Hernandez is an 71 y.o. female.   Chief Complaint  Patient presents with   Follow-up    Update H&P    HPI: Heidi Hernandez is a 71 year old female seen in F/u for a right sided Bochdalek hernia.  This was discover after CT of the chest was done for surveillance of her ectatic thoracic aorta. CT scan personally reviewed and there is evidence of approximately 2- 3 cm right posterior and medial diaphragmatic hernia with some fat herniation.  There is evidence of mildly ectatic ascending aorta She endorses no dyspnea on exertion.  She does have some reflux symptoms but I do not think they are related. prior Open splenectomy for ITP.  Platelets are better in the 140s.  No evidence of easy bruising or easy bleeding. History of cholecystectomy and umbilical hernia repair. C section x 2 and abdominal hysterectomy. SHe is otherwise in good health and is able to perform more than 4 METS of activity without any shortness of breath or chest pain.  She does have mitral valve prolapse. SHe also has a history of hemochromatosis some residual mild thrombocytopenia.  Her platelet count last time was 143,003 weeks ago, hemoglobin 13.8 CMP was completely normal. She has completed her preop cardiac optimization/assessment and she is low risk and optimized. She does take methadone for her knee 9 low dose)  Past Medical History:  Diagnosis Date   Anxiety    Asthma    GERD (gastroesophageal reflux disease)    Headache    Migraines   Hypertension    Idiopathic thrombocytopenia purpura (HCC)    Required splenectomy   MVP (mitral valve prolapse)    Neuromuscular disorder (HCC)    Restless Leg Syndrome   Pancreatitis April 2017 [facility] due to cholecystitis.    Pneumonia    06-2015    Past Surgical History:  Procedure Laterality Date   ABDOMINAL HYSTERECTOMY     APPENDECTOMY     CARPAL TUNNEL RELEASE Bilateral 11/19/2014   Procedure: CARPAL TUNNEL RELEASE;   Surgeon: Donato Heinz, MD;  Location: [facility] ORS;  Service: Orthopedics;  Laterality: Bilateral;   CESAREAN SECTION     times 2   CHOLECYSTECTOMY  09/29/2015   [facility]   COLONOSCOPY N/A 05/02/2016   Procedure: COLONOSCOPY;  Surgeon: Scot Jun, MD;  Location: [facility] ENDOSCOPY;  Service: Endoscopy;  Laterality: N/A;   ESOPHAGOGASTRODUODENOSCOPY (EGD) WITH PROPOFOL N/A 05/02/2016   Procedure: ESOPHAGOGASTRODUODENOSCOPY (EGD) WITH PROPOFOL;  Surgeon: Scot Jun, MD;  Location: [facility] ENDOSCOPY;  Service: Endoscopy;  Laterality: N/A;   ESOPHAGOGASTRODUODENOSCOPY (EGD) WITH PROPOFOL N/A 11/22/2017   Procedure: ESOPHAGOGASTRODUODENOSCOPY (EGD) WITH PROPOFOL;  Surgeon: Scot Jun, MD;  Location: [facility] ENDOSCOPY;  Service: Endoscopy;  Laterality: N/A;   HERNIA REPAIR     times 3   JOINT REPLACEMENT     OVARIAN CYST SURGERY     SHOULDER ARTHROSCOPY WITH ROTATOR CUFF REPAIR     times 2   SPLENECTOMY, TOTAL     TOTAL SHOULDER ARTHROPLASTY Right 04/21/2017   Procedure: REVERSE SHOULDER ARTHROPLASTY;  Surgeon: Signa Kell, MD;  Location: [facility] ORS;  Service: Orthopedics;  Laterality: Right;   TRIGGER FINGER RELEASE Right 11/19/2014   Procedure: RELEASE TRIGGER FINGER/A-1 PULLEY;  Surgeon: Donato Heinz, MD;  Location: [facility] ORS;  Service: Orthopedics;  Laterality: Right;   TRIGGER FINGER RELEASE Right 08/12/2015   Procedure: RELEASE TRIGGER LONG FINGER;  Surgeon: Donato Heinz, MD;  Location: [facility]  ORS;  Service: Orthopedics;  Laterality: Right;    Family History  Problem Relation Age of Onset   Hypertension Mother    Renal Disease Father        ESRD with HD    Social History:  reports that she quit smoking about 42 years ago. Her smoking use included cigarettes. She has a 10.00 pack-year smoking history. She has never used smokeless tobacco. She reports that she does not currently use alcohol. She reports that she does not use drugs.  Allergies:  Allergies  Allergen Reactions    Augmentin [Amoxicillin-Pot Clavulanate] Nausea And Vomiting   Cat Hair Extract     Other reaction(s): Not available   Dog Epithelium Allergy Skin Test     Other reaction(s): Not available   Droperidol Other (See Comments)    "Locked jaw" Facial muscles locked "Locked jaw"   Molds & Smuts     Other reaction(s): Not available   Paroxetine Hcl Other (See Comments)    syncope Passed out    Tree Extract     Other reaction(s): Not available   Morphine Itching    Medications reviewed.    ROS Full ROS performed and is otherwise negative other than what is stated in HPI   BP 130/83   Pulse 99   Temp 97.9 F (36.6 C) (Oral)   Ht 5\' 4"  (1.626 m)   Wt 126 lb (57.2 kg)   SpO2 98%   BMI 21.63 kg/m   Physical Exam Vitals and nursing note reviewed. Exam conducted with a chaperone present.  Constitutional:      Appearance: Normal appearance. She is normal weight. She is not ill-appearing or diaphoretic.  Eyes:     General: No scleral icterus.       Right eye: No discharge.        Left eye: No discharge.  Cardiovascular:     Rate and Rhythm: Normal rate and regular rhythm.     Heart sounds: Murmur heard.     No friction rub. No gallop.  Pulmonary:     Effort: Pulmonary effort is normal. No respiratory distress.     Breath sounds: Normal breath sounds. No stridor. No wheezing or rhonchi.  Abdominal:     General: Abdomen is flat. There is no distension.     Palpations: Abdomen is soft. There is no mass.     Tenderness: There is no abdominal tenderness. There is no guarding or rebound.     Hernia: No hernia is present.  Musculoskeletal:        General: No swelling or tenderness. Normal range of motion.     Cervical back: Normal range of motion and neck supple. No rigidity or tenderness.  Lymphadenopathy:     Cervical: No cervical adenopathy.  Skin:    General: Skin is warm and dry.     Capillary Refill: Capillary refill takes less than 2 seconds.  Neurological:      General: No focal deficit present.     Mental Status: She is alert and oriented to person, place, and time.  Psychiatric:        Mood and Affect: Mood normal.        Behavior: Behavior normal.        Thought Content: Thought content normal.        Judgment: Judgment normal.    Assessment/Plan: 71 year old female with a right-sided Bochdalek postero medial diaphragmatic hernia. I had an extensive discussion with the patient.  I do not think  that she is symptomatic at this time.  Unfortunately there is no good data long-term about management of Bochdalek hernias but classic Teaching states that we probably should fix those defects because if there was evidence of incarceration or strangulation's usually complications are catastrophic.   An extensive discussion with the patient regarding my thought process and I was very transparent.  The reason that we will be doing repair is to prevent potential disastrous complications. Given her thrombocytopenia we will probably do  type and cross. Please Note that I have spent 40 minutes's encounter including personally reviewing imaging studies, placing orders, coordinating her care, and providing appropriate documentation  Sterling Big, MD [facility] General Surgeon

## 2022-06-15 NOTE — Progress Notes (Signed)
Outpatient Surgical Follow Up  06/15/2022  Heidi Hernandez is an 71 y.o. female.   Chief Complaint  Patient presents with   Follow-up    Update H&P    HPI: Heidi Hernandez is a 71-year-old female seen in F/u for a right sided Bochdalek hernia.  This was discover after CT of the chest was done for surveillance of her ectatic thoracic aorta. CT scan personally reviewed and there is evidence of approximately 2- 3 cm right posterior and medial diaphragmatic hernia with some fat herniation.  There is evidence of mildly ectatic ascending aorta She endorses no dyspnea on exertion.  She does have some reflux symptoms but I do not think they are related. prior Open splenectomy for ITP.  Platelets are better in the 140s.  No evidence of easy bruising or easy bleeding. History of cholecystectomy and umbilical hernia repair. C section x 2 and abdominal hysterectomy. SHe is otherwise in good health and is able to perform more than 4 METS of activity without any shortness of breath or chest pain.  She does have mitral valve prolapse. SHe also has a history of hemochromatosis some residual mild thrombocytopenia.  Her platelet count last time was 143,003 weeks ago, hemoglobin 13.8 CMP was completely normal. She has completed her preop cardiac optimization/assessment and she is low risk and optimized. She does take methadone for her knee 9 low dose)  Past Medical History:  Diagnosis Date   Anxiety    Asthma    GERD (gastroesophageal reflux disease)    Headache    Migraines   Hypertension    Idiopathic thrombocytopenia purpura (HCC)    Required splenectomy   MVP (mitral valve prolapse)    Neuromuscular disorder (HCC)    Restless Leg Syndrome   Pancreatitis April 2017 [facility] due to cholecystitis.    Pneumonia    06-2015    Past Surgical History:  Procedure Laterality Date   ABDOMINAL HYSTERECTOMY     APPENDECTOMY     CARPAL TUNNEL RELEASE Bilateral 11/19/2014   Procedure: CARPAL TUNNEL RELEASE;   Surgeon: James P Hooten, MD;  Location: [facility] ORS;  Service: Orthopedics;  Laterality: Bilateral;   CESAREAN SECTION     times 2   CHOLECYSTECTOMY  09/29/2015   [facility]   COLONOSCOPY N/A 05/02/2016   Procedure: COLONOSCOPY;  Surgeon: Robert T Elliott, MD;  Location: [facility] ENDOSCOPY;  Service: Endoscopy;  Laterality: N/A;   ESOPHAGOGASTRODUODENOSCOPY (EGD) WITH PROPOFOL N/A 05/02/2016   Procedure: ESOPHAGOGASTRODUODENOSCOPY (EGD) WITH PROPOFOL;  Surgeon: Robert T Elliott, MD;  Location: [facility] ENDOSCOPY;  Service: Endoscopy;  Laterality: N/A;   ESOPHAGOGASTRODUODENOSCOPY (EGD) WITH PROPOFOL N/A 11/22/2017   Procedure: ESOPHAGOGASTRODUODENOSCOPY (EGD) WITH PROPOFOL;  Surgeon: Elliott, Robert T, MD;  Location: [facility] ENDOSCOPY;  Service: Endoscopy;  Laterality: N/A;   HERNIA REPAIR     times 3   JOINT REPLACEMENT     OVARIAN CYST SURGERY     SHOULDER ARTHROSCOPY WITH ROTATOR CUFF REPAIR     times 2   SPLENECTOMY, TOTAL     TOTAL SHOULDER ARTHROPLASTY Right 04/21/2017   Procedure: REVERSE SHOULDER ARTHROPLASTY;  Surgeon: Patel, Sunny, MD;  Location: [facility] ORS;  Service: Orthopedics;  Laterality: Right;   TRIGGER FINGER RELEASE Right 11/19/2014   Procedure: RELEASE TRIGGER FINGER/A-1 PULLEY;  Surgeon: James P Hooten, MD;  Location: [facility] ORS;  Service: Orthopedics;  Laterality: Right;   TRIGGER FINGER RELEASE Right 08/12/2015   Procedure: RELEASE TRIGGER LONG FINGER;  Surgeon: James P Hooten, MD;  Location: [facility]   ORS;  Service: Orthopedics;  Laterality: Right;    Family History  Problem Relation Age of Onset   Hypertension Mother    Renal Disease Father        ESRD with HD    Social History:  reports that she quit smoking about 42 years ago. Her smoking use included cigarettes. She has a 10.00 pack-year smoking history. She has never used smokeless tobacco. She reports that she does not currently use alcohol. She reports that she does not use drugs.  Allergies:  Allergies  Allergen Reactions    Augmentin [Amoxicillin-Pot Clavulanate] Nausea And Vomiting   Cat Hair Extract     Other reaction(s): Not available   Dog Epithelium Allergy Skin Test     Other reaction(s): Not available   Droperidol Other (See Comments)    "Locked jaw" Facial muscles locked "Locked jaw"   Molds & Smuts     Other reaction(s): Not available   Paroxetine Hcl Other (See Comments)    syncope Passed out    Tree Extract     Other reaction(s): Not available   Morphine Itching    Medications reviewed.    ROS Full ROS performed and is otherwise negative other than what is stated in HPI   BP 130/83   Pulse 99   Temp 97.9 F (36.6 C) (Oral)   Ht 5' 4" (1.626 m)   Wt 126 lb (57.2 kg)   SpO2 98%   BMI 21.63 kg/m   Physical Exam Vitals and nursing note reviewed. Exam conducted with a chaperone present.  Constitutional:      Appearance: Normal appearance. She is normal weight. She is not ill-appearing or diaphoretic.  Eyes:     General: No scleral icterus.       Right eye: No discharge.        Left eye: No discharge.  Cardiovascular:     Rate and Rhythm: Normal rate and regular rhythm.     Heart sounds: Murmur heard.     No friction rub. No gallop.  Pulmonary:     Effort: Pulmonary effort is normal. No respiratory distress.     Breath sounds: Normal breath sounds. No stridor. No wheezing or rhonchi.  Abdominal:     General: Abdomen is flat. There is no distension.     Palpations: Abdomen is soft. There is no mass.     Tenderness: There is no abdominal tenderness. There is no guarding or rebound.     Hernia: No hernia is present.  Musculoskeletal:        General: No swelling or tenderness. Normal range of motion.     Cervical back: Normal range of motion and neck supple. No rigidity or tenderness.  Lymphadenopathy:     Cervical: No cervical adenopathy.  Skin:    General: Skin is warm and dry.     Capillary Refill: Capillary refill takes less than 2 seconds.  Neurological:      General: No focal deficit present.     Mental Status: She is alert and oriented to person, place, and time.  Psychiatric:        Mood and Affect: Mood normal.        Behavior: Behavior normal.        Thought Content: Thought content normal.        Judgment: Judgment normal.    Assessment/Plan: 71-year-old female with a right-sided Bochdalek postero medial diaphragmatic hernia. I had an extensive discussion with the patient.  I do not think   that she is symptomatic at this time.  Unfortunately there is no good data long-term about management of Bochdalek hernias but classic Teaching states that we probably should fix those defects because if there was evidence of incarceration or strangulation's usually complications are catastrophic.   An extensive discussion with the patient regarding my thought process and I was very transparent.  The reason that we will be doing repair is to prevent potential disastrous complications. Given her thrombocytopenia we will probably do  type and cross. Please Note that I have spent 40 minutes's encounter including personally reviewing imaging studies, placing orders, coordinating her care, and providing appropriate documentation  Sterling Big, MD [facility] General Surgeon

## 2022-06-26 ENCOUNTER — Other Ambulatory Visit: Payer: Self-pay | Admitting: Family Medicine

## 2022-06-26 DIAGNOSIS — M810 Age-related osteoporosis without current pathological fracture: Secondary | ICD-10-CM

## 2022-06-28 NOTE — Telephone Encounter (Signed)
Requested medications are due for refill today.  yes  Requested medications are on the active medications list.  yes  Last refill. 06/25/2021 #12 4 rf  Future visit scheduled.   no  Notes to clinic.  Labs are expired.    Requested Prescriptions  Pending Prescriptions Disp Refills   alendronate (FOSAMAX) 70 MG tablet [Pharmacy Med Name: ALENDRONATE SODIUM 70 MG TAB] 12 tablet 4    Sig: TAKE 1 TABLET BY MOUTH ONCE WEEKLY ON AN EMPTY STOMACH BEFORE BREAKFAST. REMAIN UPRIGHT FOR 30 MINUTES AND TAKE WITH 8 OUNCES OF WATER     Endocrinology:  Bisphosphonates Failed - 06/26/2022  6:53 AM      Failed - Ca in normal range and within 360 days    Calcium  Date Value Ref Range Status  04/27/2022 8.8 (L) 8.9 - 10.3 mg/dL Final   Calcium, Ion  Date Value Ref Range Status  06/27/2010 1.16 1.12 - 1.32 mmol/L Final         Failed - Mg Level in normal range and within 360 days    Magnesium  Date Value Ref Range Status  04/01/2019 2.0 1.6 - 2.3 mg/dL Final         Failed - Phosphate in normal range and within 360 days    Phosphorus  Date Value Ref Range Status  01/15/2018 3.6 2.5 - 4.5 mg/dL Final         Passed - Vitamin D in normal range and within 360 days    Vit D, 25-Hydroxy  Date Value Ref Range Status  05/03/2022 43.0 30.0 - 100.0 ng/mL Final    Comment:    Vitamin D deficiency has been defined by the Institute of Medicine and an Endocrine Society practice guideline as a level of serum 25-OH vitamin D less than 20 ng/mL (1,2). The Endocrine Society went on to further define vitamin D insufficiency as a level between 21 and 29 ng/mL (2). 1. IOM (Institute of Medicine). 2010. Dietary reference    intakes for calcium and D. [place]: The    [employer]. 2. Holick MF, Binkley [place], Bischoff-Ferrari HA, et al.    Evaluation, treatment, and prevention of vitamin D    deficiency: an Endocrine Society clinical practice    guideline. JCEM. 2011 Jul;  96(7):1911-30.          Passed - Cr in normal range and within 360 days    Creatinine, Ser  Date Value Ref Range Status  04/27/2022 0.54 0.44 - 1.00 mg/dL Final         Passed - eGFR is 30 or above and within 360 days    GFR calc Af Amer  Date Value Ref Range Status  06/04/2020 99 >59 mL/min/1.73 Final    Comment:    **In accordance with recommendations from the NKF-ASN Task force,**   [facility] is in the process of updating its eGFR calculation to the   2021 CKD-EPI creatinine equation that estimates kidney function   without a race variable.    GFR, Estimated  Date Value Ref Range Status  04/27/2022 >60 >60 mL/min Final    Comment:    (NOTE) Calculated using the CKD-EPI Creatinine Equation (2021)          Passed - Valid encounter within last 12 months    Recent Outpatient Visits           1 month ago Nocturia   King, MD   2 months ago  Restless legs syndrome   [place], DO   4 months ago Primary hypertension   [facility] Birdie Sons, MD   7 months ago Injury of left great toe, initial encounter   [facility] [name], PA-C   7 months ago [place], Donald E, MD       Future Appointments             In 4 months Grayland Ormond, Kathlene November, MD [facility] [place] - Bone Mineral Density or Dexa Scan completed in the last 2 years

## 2022-06-29 ENCOUNTER — Encounter
Admission: RE | Admit: 2022-06-29 | Discharge: 2022-06-29 | Disposition: A | Discharge status: Home / Self Care | Payer: Medicare HMO | Source: Ambulatory Visit | Attending: Surgery | Admitting: Surgery

## 2022-06-29 ENCOUNTER — Other Ambulatory Visit: Payer: Self-pay

## 2022-06-29 DIAGNOSIS — K449 Diaphragmatic hernia without obstruction or gangrene: Secondary | ICD-10-CM | POA: Insufficient documentation

## 2022-06-29 DIAGNOSIS — Z01812 Encounter for preprocedural laboratory examination: Secondary | ICD-10-CM | POA: Diagnosis not present

## 2022-06-29 HISTORY — DX: Cardiac murmur, unspecified: R01.1

## 2022-06-29 LAB — COMPREHENSIVE METABOLIC PANEL
ALT: 18 U/L (ref 0–44)
AST: 26 U/L (ref 15–41)
Albumin: 4.3 g/dL (ref 3.5–5.0)
Alkaline Phosphatase: 60 U/L (ref 38–126)
Anion gap: 7 (ref 5–15)
BUN: 23 mg/dL (ref 8–23)
CO2: 25 mmol/L (ref 22–32)
Calcium: 8.8 mg/dL — ABNORMAL LOW (ref 8.9–10.3)
Chloride: 107 mmol/L (ref 98–111)
Creatinine, Ser: 0.62 mg/dL (ref 0.44–1.00)
GFR, Estimated: >60 mL/min (ref >60)
Glucose, Bld: 99 mg/dL (ref 70–99)
Potassium: 3.4 mmol/L — ABNORMAL LOW (ref 3.5–5.1)
Sodium: 139 mmol/L (ref 135–145)
Total Bilirubin: 0.6 mg/dL (ref 0.3–1.2)
Total Protein: 7.2 g/dL (ref 6.5–8.1)

## 2022-06-29 LAB — CBC WITH DIFFERENTIAL/PLATELET
Abs Immature Granulocytes: 0.03 10*3/uL (ref 0.00–0.07)
Basophils Absolute: 0.1 10*3/uL (ref 0.0–0.1)
Basophils Relative: 1 %
Eosinophils Absolute: 0.2 10*3/uL (ref 0.0–0.5)
Eosinophils Relative: 2 %
HCT: 38.6 % (ref 36.0–46.0)
Hemoglobin: 13.1 g/dL (ref 12.0–15.0)
Immature Granulocytes: 0 %
Lymphocytes Relative: 33 %
Lymphs Abs: 3.3 10*3/uL (ref 0.7–4.0)
MCH: 31.8 pg (ref 26.0–34.0)
MCHC: 33.9 g/dL (ref 30.0–36.0)
MCV: 93.7 fL (ref 80.0–100.0)
Monocytes Absolute: 0.8 10*3/uL (ref 0.1–1.0)
Monocytes Relative: 9 %
Neutro Abs: 5.4 10*3/uL (ref 1.7–7.7)
Neutrophils Relative %: 55 %
Platelets: 185 10*3/uL (ref 150–400)
RBC: 4.12 MIL/uL (ref 3.87–5.11)
RDW: 14.6 % (ref 11.5–15.5)
WBC: 9.8 10*3/uL (ref 4.0–10.5)
nRBC: 0 % (ref 0.0–0.2)

## 2022-06-29 LAB — TYPE AND SCREEN
ABO/RH(D): O POS
Antibody Screen: NEGATIVE

## 2022-06-29 NOTE — Pre-Procedure Instructions (Deleted)
Allyson faxed over Medical Clearance from Dr Sharyon Cable office-Clearance placed on chart

## 2022-06-29 NOTE — Patient Instructions (Addendum)
Your procedure is scheduled on: 07/05/22 - Tuesday Report to the Registration Desk on the 1st floor of the [facility]. To find out your arrival time, please call [phone] between 1PM - 3PM on: 07/04/22 - Monday If your arrival time is 6:00 am, do not arrive prior to that time as the [facility] entrance doors do not open until 6:00 am.  REMEMBER: Instructions that are not followed completely may result in serious medical risk, up to and including death; or upon the discretion of your surgeon and anesthesiologist your surgery may need to be rescheduled.  Do not eat food or drink any liquids after midnight the night before surgery.  No gum chewing, lozengers or hard candies.   TAKE THESE MEDICATIONS THE MORNING OF SURGERY WITH A SIP OF WATER:  - omeprazole (PRILOSEC) - (take one the night before and one on the morning of surgery - helps to prevent nausea after surgery.) - methadone (DOLOPHINE)   - atenolol (TENORMIN)  - amLODipine (NORVASC)  - albuterol (VENTOLIN HFA) - Use inhalers on the day of surgery and bring to the hospital.  One week prior to surgery: Stop Anti-inflammatories (NSAIDS) such as Advil, Aleve, Ibuprofen, Motrin, Naproxen, Naprosyn and Aspirin based products such as Excedrin, Goodys Powder, BC Powder.  Stop ANY OVER THE COUNTER supplements until after surgery.  You may take Tylenol if needed for pain up until the day of surgery.  No Alcohol for 24 hours before or after surgery.  No Smoking including e-cigarettes for 24 hours prior to surgery.  No chewable tobacco products for at least 6 hours prior to surgery.  No nicotine patches on the day of surgery.  Do not use any "recreational" drugs for at least a week prior to your surgery.  Please be advised that the combination of cocaine and anesthesia may have negative outcomes, up to and including death. If you test positive for cocaine, your surgery will be cancelled.  On the morning of surgery brush your  teeth with toothpaste and water, you may rinse your mouth with mouthwash if you wish. Do not swallow any toothpaste or mouthwash.  Use CHG Soap or wipes as directed on instruction sheet.  Do not wear jewelry, make-up, hairpins, clips or nail polish.  Do not wear lotions, powders, or perfumes.   Do not shave body from the neck down 48 hours prior to surgery just in case you cut yourself which could leave a site for infection.  Also, freshly shaved skin may become irritated if using the CHG soap.  Contact lenses, hearing aids and dentures may not be worn into surgery.  Do not bring valuables to the hospital. [facility] is not responsible for any missing/lost belongings or valuables.   Notify your doctor if there is any change in your medical condition (cold, fever, infection).  Wear comfortable clothing (specific to your surgery type) to the hospital.  After surgery, you can help prevent lung complications by doing breathing exercises.  Take deep breaths and cough every 1-2 hours. Your doctor may order a device called an Incentive Spirometer to help you take deep breaths. When coughing or sneezing, hold a pillow firmly against your incision with both hands. This is called "splinting." Doing this helps protect your incision. It also decreases belly discomfort.  If you are being admitted to the hospital overnight, leave your suitcase in the car. After surgery it may be brought to your room.  If you are being discharged the day of surgery, you  will not be allowed to drive home. You will need a responsible adult (18 years or older) to drive you home and stay with you that night.   If you are taking public transportation, you will need to have a responsible adult (18 years or older) with you. Please confirm with your physician that it is acceptable to use public transportation.   Please call the [place] Dept. at [phone] if you have any questions about these  instructions.  Surgery Visitation Policy:  Patients undergoing a surgery or procedure may have two family members or support persons with them as long as the person is not COVID-19 positive or experiencing its symptoms.   Inpatient Visitation:    Visiting hours are 7 a.m. to 8 p.m. Up to four visitors are allowed at one time in a patient room. The visitors may rotate out with other people during the day. One designated support person (adult) may remain overnight.  Due to an increase in RSV and influenza rates and associated hospitalizations, children ages 42 and under will not be able to visit patients in [facility]. Masks continue to be strongly recommended.

## 2022-06-29 NOTE — Pre-Procedure Instructions (Signed)
Cardiac clearance on chart from Dr Jefferey Pica acceptable risk

## 2022-07-05 ENCOUNTER — Other Ambulatory Visit: Payer: Self-pay

## 2022-07-05 ENCOUNTER — Observation Stay
Admission: RE | Admit: 2022-07-05 | Discharge: 2022-07-07 | Disposition: A | Discharge status: Home / Self Care | Payer: Medicare HMO | Attending: Surgery | Admitting: Surgery

## 2022-07-05 ENCOUNTER — Encounter
Admission: RE | Disposition: A | Discharge status: Home / Self Care | Payer: Self-pay | Source: Home / Self Care | Attending: Surgery

## 2022-07-05 ENCOUNTER — Encounter: Payer: Self-pay | Admitting: Surgery

## 2022-07-05 ENCOUNTER — Ambulatory Visit: Payer: Medicare HMO | Admitting: Anesthesiology

## 2022-07-05 DIAGNOSIS — Z8719 Personal history of other diseases of the digestive system: Secondary | ICD-10-CM

## 2022-07-05 DIAGNOSIS — Z87891 Personal history of nicotine dependence: Secondary | ICD-10-CM | POA: Insufficient documentation

## 2022-07-05 DIAGNOSIS — K440 Diaphragmatic hernia with obstruction, without gangrene: Secondary | ICD-10-CM | POA: Diagnosis not present

## 2022-07-05 DIAGNOSIS — Z01810 Encounter for preprocedural cardiovascular examination: Secondary | ICD-10-CM | POA: Diagnosis not present

## 2022-07-05 DIAGNOSIS — K449 Diaphragmatic hernia without obstruction or gangrene: Secondary | ICD-10-CM | POA: Diagnosis not present

## 2022-07-05 DIAGNOSIS — Z96611 Presence of right artificial shoulder joint: Secondary | ICD-10-CM | POA: Insufficient documentation

## 2022-07-05 DIAGNOSIS — J45909 Unspecified asthma, uncomplicated: Secondary | ICD-10-CM | POA: Insufficient documentation

## 2022-07-05 DIAGNOSIS — I10 Essential (primary) hypertension: Secondary | ICD-10-CM | POA: Insufficient documentation

## 2022-07-05 DIAGNOSIS — R69 Illness, unspecified: Secondary | ICD-10-CM | POA: Diagnosis not present

## 2022-07-05 HISTORY — PX: INSERTION OF MESH: SHX5868

## 2022-07-05 HISTORY — PX: XI ROBOTIC ASSISTED PARAESOPHAGEAL HERNIA REPAIR: SHX6871

## 2022-07-05 LAB — CBC
HCT: 36.6 % (ref 36.0–46.0)
Hemoglobin: 12.4 g/dL (ref 12.0–15.0)
MCH: 31.6 pg (ref 26.0–34.0)
MCHC: 33.9 g/dL (ref 30.0–36.0)
MCV: 93.4 fL (ref 80.0–100.0)
Platelets: 136 10*3/uL — ABNORMAL LOW (ref 150–400)
RBC: 3.92 MIL/uL (ref 3.87–5.11)
RDW: 14.3 % (ref 11.5–15.5)
WBC: 13.1 10*3/uL — ABNORMAL HIGH (ref 4.0–10.5)
nRBC: 0 % (ref 0.0–0.2)

## 2022-07-05 LAB — CREATININE, SERUM
Creatinine, Ser: 0.78 mg/dL (ref 0.44–1.00)
GFR, Estimated: >60 mL/min (ref >60)

## 2022-07-05 SURGERY — INSERTION OF MESH
Anesthesia: General

## 2022-07-05 MED ORDER — LACTATED RINGERS IV SOLN: INTRAVENOUS | Status: DC

## 2022-07-05 MED ORDER — LORATADINE 10 MG PO TABS: 10.0000 mg | ORAL_TABLET | Freq: Every day | ORAL | Status: DC

## 2022-07-05 MED ORDER — OXYCODONE HCL 5 MG/5ML PO SOLN: 5.0000 mg | Freq: Once | ORAL | Status: DC | PRN

## 2022-07-05 MED ORDER — HYDROMORPHONE HCL 1 MG/ML IJ SOLN: 0.5000 mg | INTRAMUSCULAR | Status: DC | PRN

## 2022-07-05 MED ORDER — EPHEDRINE SULFATE (PRESSORS) 50 MG/ML IJ SOLN
INTRAMUSCULAR | Status: DC | PRN
  Administered 2022-07-05: 5 mg via INTRAVENOUS

## 2022-07-05 MED ORDER — FENTANYL CITRATE (PF) 100 MCG/2ML IJ SOLN
INTRAMUSCULAR | Status: AC
  Filled 2022-07-05: qty 2

## 2022-07-05 MED ORDER — SODIUM CHLORIDE 0.9% IV SOLUTION: Freq: Once | INTRAVENOUS | Status: DC

## 2022-07-05 MED ORDER — PHENYLEPHRINE 80 MCG/ML (10ML) SYRINGE FOR IV PUSH (FOR BLOOD PRESSURE SUPPORT)
PREFILLED_SYRINGE | INTRAVENOUS | Status: DC | PRN
  Administered 2022-07-05: 40 ug via INTRAVENOUS

## 2022-07-05 MED ORDER — ACETAMINOPHEN 500 MG PO TABS
1000.0000 mg | ORAL_TABLET | Freq: Four times a day (QID) | ORAL | Status: DC
  Administered 2022-07-06 (×2): 1000 mg via ORAL

## 2022-07-05 MED ORDER — BUDESONIDE 0.25 MG/2ML IN SUSP
0.2500 mg | Freq: Two times a day (BID) | RESPIRATORY_TRACT | Status: DC
  Administered 2022-07-06 – 2022-07-07 (×3): 0.25 mg via RESPIRATORY_TRACT
  Filled 2022-07-05 (×3): qty 2

## 2022-07-05 MED ORDER — PROPOFOL 500 MG/50ML IV EMUL
INTRAVENOUS | Status: DC | PRN
  Administered 2022-07-05: 15 ug/kg/min via INTRAVENOUS

## 2022-07-05 MED ORDER — ACETAMINOPHEN 500 MG PO TABS
ORAL_TABLET | ORAL | Status: AC
  Administered 2022-07-05: 1000 mg via ORAL
  Filled 2022-07-05: qty 2

## 2022-07-05 MED ORDER — PROCHLORPERAZINE EDISYLATE 10 MG/2ML IJ SOLN: 5.0000 mg | Freq: Four times a day (QID) | INTRAMUSCULAR | Status: DC | PRN

## 2022-07-05 MED ORDER — PRAMIPEXOLE DIHYDROCHLORIDE 0.25 MG PO TABS
0.5000 mg | ORAL_TABLET | Freq: Two times a day (BID) | ORAL | Status: DC
  Administered 2022-07-06 (×2): 0.5 mg via ORAL
  Filled 2022-07-05 (×3): qty 2

## 2022-07-05 MED ORDER — CEFAZOLIN SODIUM-DEXTROSE 2-4 GM/100ML-% IV SOLN
INTRAVENOUS | Status: AC
  Administered 2022-07-05: 2 g via INTRAVENOUS
  Filled 2022-07-05: qty 100

## 2022-07-05 MED ORDER — ONDANSETRON HCL 4 MG/2ML IJ SOLN
4.0000 mg | Freq: Once | INTRAMUSCULAR | Status: AC | PRN
  Administered 2022-07-05: 4 mg via INTRAVENOUS

## 2022-07-05 MED ORDER — PROCHLORPERAZINE MALEATE 10 MG PO TABS
10.0000 mg | ORAL_TABLET | Freq: Four times a day (QID) | ORAL | Status: DC | PRN
  Administered 2022-07-05: 10 mg via ORAL

## 2022-07-05 MED ORDER — LIDOCAINE HCL (CARDIAC) PF 100 MG/5ML IV SOSY
PREFILLED_SYRINGE | INTRAVENOUS | Status: DC | PRN
  Administered 2022-07-05 (×2): 60 mg via INTRAVENOUS

## 2022-07-05 MED ORDER — CELECOXIB 200 MG PO CAPS: 200.0000 mg | ORAL_CAPSULE | ORAL | Status: AC

## 2022-07-05 MED ORDER — GLYCOPYRROLATE 0.2 MG/ML IJ SOLN
INTRAMUSCULAR | Status: DC | PRN
  Administered 2022-07-05 (×2): .1 mg via INTRAVENOUS

## 2022-07-05 MED ORDER — GABAPENTIN 300 MG PO CAPS
ORAL_CAPSULE | ORAL | Status: AC
  Administered 2022-07-05: 300 mg via ORAL
  Filled 2022-07-05: qty 1

## 2022-07-05 MED ORDER — CEFAZOLIN SODIUM-DEXTROSE 2-4 GM/100ML-% IV SOLN
INTRAVENOUS | Status: AC
  Filled 2022-07-05: qty 100

## 2022-07-05 MED ORDER — ROCURONIUM BROMIDE 100 MG/10ML IV SOLN
INTRAVENOUS | Status: DC | PRN
  Administered 2022-07-05: 10 mg via INTRAVENOUS
  Administered 2022-07-05: 20 mg via INTRAVENOUS
  Administered 2022-07-05 (×2): 10 mg via INTRAVENOUS
  Administered 2022-07-05: 30 mg via INTRAVENOUS
  Administered 2022-07-05: 20 mg via INTRAVENOUS
  Administered 2022-07-05 (×2): 10 mg via INTRAVENOUS

## 2022-07-05 MED ORDER — CEFAZOLIN SODIUM-DEXTROSE 2-4 GM/100ML-% IV SOLN
2.0000 g | INTRAVENOUS | Status: AC
  Administered 2022-07-05: 2 g via INTRAVENOUS

## 2022-07-05 MED ORDER — FENTANYL CITRATE (PF) 100 MCG/2ML IJ SOLN
INTRAMUSCULAR | Status: DC | PRN
  Administered 2022-07-05: 25 ug via INTRAVENOUS
  Administered 2022-07-05 (×3): 50 ug via INTRAVENOUS
  Administered 2022-07-05: 25 ug via INTRAVENOUS

## 2022-07-05 MED ORDER — 0.9 % SODIUM CHLORIDE (POUR BTL) OPTIME
TOPICAL | Status: DC | PRN
  Administered 2022-07-05: 500 mL

## 2022-07-05 MED ORDER — DEXAMETHASONE SODIUM PHOSPHATE 10 MG/ML IJ SOLN
INTRAMUSCULAR | Status: AC
  Filled 2022-07-05: qty 1

## 2022-07-05 MED ORDER — ACETAMINOPHEN 500 MG PO TABS
ORAL_TABLET | ORAL | Status: AC
  Administered 2022-07-05: 1000 mg
  Filled 2022-07-05: qty 2

## 2022-07-05 MED ORDER — ALBUTEROL SULFATE HFA 108 (90 BASE) MCG/ACT IN AERS
INHALATION_SPRAY | RESPIRATORY_TRACT | Status: AC
  Filled 2022-07-05: qty 6.7

## 2022-07-05 MED ORDER — ENOXAPARIN SODIUM 40 MG/0.4ML IJ SOSY: 40.0000 mg | PREFILLED_SYRINGE | INTRAMUSCULAR | Status: DC

## 2022-07-05 MED ORDER — BUPIVACAINE-EPINEPHRINE (PF) 0.5% -1:200000 IJ SOLN
INTRAMUSCULAR | Status: AC
  Filled 2022-07-05: qty 30

## 2022-07-05 MED ORDER — MONTELUKAST SODIUM 10 MG PO TABS
10.0000 mg | ORAL_TABLET | Freq: Every day | ORAL | Status: DC
  Administered 2022-07-05 – 2022-07-06 (×2): 10 mg via ORAL
  Filled 2022-07-05 (×2): qty 1

## 2022-07-05 MED ORDER — BUPIVACAINE-EPINEPHRINE 0.25% -1:200000 IJ SOLN
INTRAMUSCULAR | Status: DC | PRN
  Administered 2022-07-05: 30 mL

## 2022-07-05 MED ORDER — CHLORHEXIDINE GLUCONATE 0.12 % MT SOLN: 15.0000 mL | Freq: Once | OROMUCOSAL | Status: AC

## 2022-07-05 MED ORDER — CHLORHEXIDINE GLUCONATE CLOTH 2 % EX PADS
6.0000 | MEDICATED_PAD | Freq: Once | CUTANEOUS | Status: DC
  Administered 2022-07-05: 6 via TOPICAL

## 2022-07-05 MED ORDER — KETOROLAC TROMETHAMINE 15 MG/ML IJ SOLN
INTRAMUSCULAR | Status: AC
  Filled 2022-07-05: qty 1

## 2022-07-05 MED ORDER — ONDANSETRON HCL 4 MG/2ML IJ SOLN
INTRAMUSCULAR | Status: DC | PRN
  Administered 2022-07-05: 4 mg via INTRAVENOUS

## 2022-07-05 MED ORDER — MELATONIN 5 MG PO TABS: 5.0000 mg | ORAL_TABLET | Freq: Every evening | ORAL | Status: DC | PRN

## 2022-07-05 MED ORDER — PRAMIPEXOLE DIHYDROCHLORIDE 0.25 MG PO TABS
0.5000 mg | ORAL_TABLET | Freq: Two times a day (BID) | ORAL | Status: DC | PRN
  Administered 2022-07-05 (×2): 0.5 mg via ORAL
  Filled 2022-07-05 (×3): qty 2

## 2022-07-05 MED ORDER — VISTASEAL 10 ML SINGLE DOSE KIT
PACK | CUTANEOUS | Status: DC | PRN
  Administered 2022-07-05: 10 mL via TOPICAL

## 2022-07-05 MED ORDER — EPHEDRINE 5 MG/ML INJ
INTRAVENOUS | Status: AC
  Filled 2022-07-05: qty 5

## 2022-07-05 MED ORDER — FLUTICASONE PROPIONATE 50 MCG/ACT NA SUSP: 2.0000 | NASAL | Status: DC | PRN

## 2022-07-05 MED ORDER — ACETAMINOPHEN 500 MG PO TABS: 1000.0000 mg | ORAL_TABLET | ORAL | Status: AC

## 2022-07-05 MED ORDER — ONDANSETRON HCL 4 MG/2ML IJ SOLN
INTRAMUSCULAR | Status: AC
  Filled 2022-07-05: qty 2

## 2022-07-05 MED ORDER — DEXAMETHASONE SODIUM PHOSPHATE 10 MG/ML IJ SOLN
INTRAMUSCULAR | Status: DC | PRN
  Administered 2022-07-05: 10 mg via INTRAVENOUS

## 2022-07-05 MED ORDER — ONDANSETRON HCL 4 MG/2ML IJ SOLN: 4.0000 mg | Freq: Four times a day (QID) | INTRAMUSCULAR | Status: DC | PRN

## 2022-07-05 MED ORDER — HYDRALAZINE HCL 20 MG/ML IJ SOLN: 10.0000 mg | INTRAMUSCULAR | Status: DC | PRN

## 2022-07-05 MED ORDER — PHENYLEPHRINE HCL-NACL 20-0.9 MG/250ML-% IV SOLN
INTRAVENOUS | Status: DC | PRN
  Administered 2022-07-05: 20 ug/min via INTRAVENOUS

## 2022-07-05 MED ORDER — KETOROLAC TROMETHAMINE 15 MG/ML IJ SOLN
15.0000 mg | Freq: Four times a day (QID) | INTRAMUSCULAR | Status: DC | PRN
  Administered 2022-07-05: 15 mg via INTRAVENOUS

## 2022-07-05 MED ORDER — CHLORHEXIDINE GLUCONATE CLOTH 2 % EX PADS: 6.0000 | MEDICATED_PAD | Freq: Every day | CUTANEOUS | Status: DC

## 2022-07-05 MED ORDER — ALBUTEROL SULFATE (2.5 MG/3ML) 0.083% IN NEBU: 2.5000 mg | INHALATION_SOLUTION | Freq: Four times a day (QID) | RESPIRATORY_TRACT | Status: DC | PRN

## 2022-07-05 MED ORDER — GABAPENTIN 300 MG PO CAPS: 300.0000 mg | ORAL_CAPSULE | ORAL | Status: AC

## 2022-07-05 MED ORDER — MIDAZOLAM HCL 2 MG/2ML IJ SOLN
INTRAMUSCULAR | Status: AC
  Filled 2022-07-05: qty 2

## 2022-07-05 MED ORDER — OXYCODONE HCL 5 MG PO TABS
ORAL_TABLET | ORAL | Status: AC
  Administered 2022-07-05: 5 mg via ORAL
  Filled 2022-07-05: qty 2

## 2022-07-05 MED ORDER — ATENOLOL 50 MG PO TABS: 25.0000 mg | ORAL_TABLET | Freq: Every day | ORAL | Status: DC

## 2022-07-05 MED ORDER — PROCHLORPERAZINE MALEATE 10 MG PO TABS
ORAL_TABLET | ORAL | Status: AC
  Filled 2022-07-05: qty 1

## 2022-07-05 MED ORDER — LACTATED RINGERS IV SOLN: INTRAVENOUS | Status: DC | PRN

## 2022-07-05 MED ORDER — BUPIVACAINE LIPOSOME 1.3 % IJ SUSP
INTRAMUSCULAR | Status: DC | PRN
  Administered 2022-07-05: 20 mL

## 2022-07-05 MED ORDER — AZELASTINE HCL 0.1 % NA SOLN: 2.0000 | NASAL | Status: DC | PRN

## 2022-07-05 MED ORDER — SODIUM CHLORIDE 0.9 % IV SOLN: INTRAVENOUS | Status: DC

## 2022-07-05 MED ORDER — ROCURONIUM BROMIDE 10 MG/ML (PF) SYRINGE
PREFILLED_SYRINGE | INTRAVENOUS | Status: AC
  Filled 2022-07-05: qty 10

## 2022-07-05 MED ORDER — VISTASEAL 10 ML SINGLE DOSE KIT
PACK | CUTANEOUS | Status: AC
  Filled 2022-07-05: qty 10

## 2022-07-05 MED ORDER — DIPHENHYDRAMINE HCL 50 MG/ML IJ SOLN
INTRAMUSCULAR | Status: DC | PRN
  Administered 2022-07-05: 12.5 mg via INTRAVENOUS

## 2022-07-05 MED ORDER — PHENYLEPHRINE 80 MCG/ML (10ML) SYRINGE FOR IV PUSH (FOR BLOOD PRESSURE SUPPORT)
PREFILLED_SYRINGE | INTRAVENOUS | Status: AC
  Filled 2022-07-05: qty 10

## 2022-07-05 MED ORDER — LIDOCAINE HCL (PF) 2 % IJ SOLN
INTRAMUSCULAR | Status: AC
  Filled 2022-07-05: qty 5

## 2022-07-05 MED ORDER — ORAL CARE MOUTH RINSE: 15.0000 mL | Freq: Once | OROMUCOSAL | Status: AC

## 2022-07-05 MED ORDER — ACETAMINOPHEN 10 MG/ML IV SOLN: 1000.0000 mg | Freq: Once | INTRAVENOUS | Status: DC | PRN

## 2022-07-05 MED ORDER — MIDAZOLAM HCL 2 MG/2ML IJ SOLN
INTRAMUSCULAR | Status: DC | PRN
  Administered 2022-07-05: 2 mg via INTRAVENOUS

## 2022-07-05 MED ORDER — OXYCODONE HCL 5 MG PO TABS: 5.0000 mg | ORAL_TABLET | Freq: Once | ORAL | Status: DC | PRN

## 2022-07-05 MED ORDER — PROPOFOL 10 MG/ML IV BOLUS
INTRAVENOUS | Status: AC
  Filled 2022-07-05: qty 40

## 2022-07-05 MED ORDER — SUMATRIPTAN SUCCINATE 50 MG PO TABS
50.0000 mg | ORAL_TABLET | ORAL | Status: DC | PRN
  Administered 2022-07-06 – 2022-07-07 (×2): 50 mg via ORAL
  Filled 2022-07-05 (×4): qty 1

## 2022-07-05 MED ORDER — BUPIVACAINE LIPOSOME 1.3 % IJ SUSP
INTRAMUSCULAR | Status: AC
  Filled 2022-07-05: qty 20

## 2022-07-05 MED ORDER — FENTANYL CITRATE (PF) 100 MCG/2ML IJ SOLN: 25.0000 ug | INTRAMUSCULAR | Status: DC | PRN

## 2022-07-05 MED ORDER — DIPHENHYDRAMINE HCL 50 MG/ML IJ SOLN
INTRAMUSCULAR | Status: AC
  Filled 2022-07-05: qty 1

## 2022-07-05 MED ORDER — OXYCODONE HCL 5 MG PO TABS
5.0000 mg | ORAL_TABLET | ORAL | Status: DC | PRN
  Administered 2022-07-06: 10 mg via ORAL

## 2022-07-05 MED ORDER — CEFAZOLIN SODIUM-DEXTROSE 2-4 GM/100ML-% IV SOLN
2.0000 g | Freq: Three times a day (TID) | INTRAVENOUS | Status: AC
  Administered 2022-07-05: 2 g via INTRAVENOUS

## 2022-07-05 MED ORDER — SUGAMMADEX SODIUM 200 MG/2ML IV SOLN
INTRAVENOUS | Status: DC | PRN
  Administered 2022-07-05: 200 mg via INTRAVENOUS

## 2022-07-05 MED ORDER — PHENYLEPHRINE HCL-NACL 20-0.9 MG/250ML-% IV SOLN
INTRAVENOUS | Status: AC
  Filled 2022-07-05: qty 250

## 2022-07-05 MED ORDER — ALBUTEROL SULFATE HFA 108 (90 BASE) MCG/ACT IN AERS
INHALATION_SPRAY | RESPIRATORY_TRACT | Status: DC | PRN
  Administered 2022-07-05 (×2): 6 via RESPIRATORY_TRACT

## 2022-07-05 MED ORDER — CELECOXIB 200 MG PO CAPS
ORAL_CAPSULE | ORAL | Status: AC
  Administered 2022-07-05: 200 mg via ORAL
  Filled 2022-07-05: qty 1

## 2022-07-05 MED ORDER — ONDANSETRON 4 MG PO TBDP: 4.0000 mg | ORAL_TABLET | Freq: Four times a day (QID) | ORAL | Status: DC | PRN

## 2022-07-05 MED ORDER — METHADONE HCL 5 MG PO TABS
5.0000 mg | ORAL_TABLET | Freq: Every day | ORAL | Status: DC
  Administered 2022-07-06 – 2022-07-07 (×2): 5 mg via ORAL
  Filled 2022-07-05 (×2): qty 1

## 2022-07-05 MED ORDER — CHLORHEXIDINE GLUCONATE 0.12 % MT SOLN
OROMUCOSAL | Status: AC
  Administered 2022-07-05: 15 mL via OROMUCOSAL
  Filled 2022-07-05: qty 15

## 2022-07-05 MED ORDER — PROPOFOL 10 MG/ML IV BOLUS
INTRAVENOUS | Status: DC | PRN
  Administered 2022-07-05: 20 mg via INTRAVENOUS
  Administered 2022-07-05: 100 mg via INTRAVENOUS

## 2022-07-05 SURGICAL SUPPLY — 62 items
APPLICATOR VISTASEAL 35 (MISCELLANEOUS) IMPLANT
CANNULA REDUC XI 12-8 STAPL (CANNULA) ×2
CANNULA REDUCER 12-8 DVNC XI (CANNULA) ×2 IMPLANT
COVER TIP SHEARS 8 DVNC (MISCELLANEOUS) ×1 IMPLANT
COVER TIP SHEARS 8MM DA VINCI (MISCELLANEOUS) ×2
DERMABOND ADVANCED .7 DNX12 (GAUZE/BANDAGES/DRESSINGS) ×2 IMPLANT
DRAPE ARM DVNC X/XI (DISPOSABLE) ×8 IMPLANT
DRAPE COLUMN DVNC XI (DISPOSABLE) ×2 IMPLANT
DRAPE DA VINCI XI ARM (DISPOSABLE) ×8
DRAPE DA VINCI XI COLUMN (DISPOSABLE) ×2
ELECT CAUTERY BLADE 6.4 (BLADE) ×2 IMPLANT
ELECT REM PT RETURN 9FT ADLT (ELECTROSURGICAL) ×2
ELECTRODE REM PT RTRN 9FT ADLT (ELECTROSURGICAL) ×2 IMPLANT
GLOVE BIO SURGEON STRL SZ7 (GLOVE) ×11 IMPLANT
GOWN STRL REUS W/ TWL LRG LVL3 (GOWN DISPOSABLE) ×12 IMPLANT
GOWN STRL REUS W/TWL LRG LVL3 (GOWN DISPOSABLE) ×16
GRASPER LAPSCPC 5X45 DSP (INSTRUMENTS) ×2 IMPLANT
IRRIGATION STRYKERFLOW (MISCELLANEOUS) IMPLANT
IRRIGATOR STRYKERFLOW (MISCELLANEOUS)
IV NS 1000ML (IV SOLUTION)
IV NS 1000ML BAXH (IV SOLUTION) IMPLANT
KIT IMAGING PINPOINTPAQ (MISCELLANEOUS) ×1 IMPLANT
KIT PINK PAD W/HEAD ARE REST (MISCELLANEOUS)
KIT PINK PAD W/HEAD ARM REST (MISCELLANEOUS) ×1 IMPLANT
LABEL OR SOLS (LABEL) ×2 IMPLANT
MANIFOLD NEPTUNE II (INSTRUMENTS) ×2 IMPLANT
MESH BIO-A 7X10 SYN MAT (Mesh General) ×1 IMPLANT
NEEDLE HYPO 22GX1.5 SAFETY (NEEDLE) ×2 IMPLANT
OBTURATOR OPTICAL STANDARD 8MM (TROCAR) ×2
OBTURATOR OPTICAL STND 8 DVNC (TROCAR) ×2
OBTURATOR OPTICALSTD 8 DVNC (TROCAR) ×2 IMPLANT
PACK LAP CHOLECYSTECTOMY (MISCELLANEOUS) ×2 IMPLANT
PENCIL SMOKE EVACUATOR (MISCELLANEOUS) ×2 IMPLANT
SEAL CANN UNIV 5-8 DVNC XI (MISCELLANEOUS) ×6 IMPLANT
SEAL XI 5MM-8MM UNIVERSAL (MISCELLANEOUS) ×6
SEALER VESSEL DA VINCI XI (MISCELLANEOUS) ×2
SEALER VESSEL EXT DVNC XI (MISCELLANEOUS) ×2 IMPLANT
SOLUTION ELECTROLUBE (MISCELLANEOUS) ×2 IMPLANT
SPIKE FLUID TRANSFER (MISCELLANEOUS) ×2 IMPLANT
SPONGE T-LAP 18X18 ~~LOC~~+RFID (SPONGE) ×2 IMPLANT
STAPLER CANNULA SEAL DVNC XI (STAPLE) ×2 IMPLANT
STAPLER CANNULA SEAL XI (STAPLE) ×2
SUT MNCRL 4-0 (SUTURE) ×2
SUT MNCRL 4-0 27XMFL (SUTURE) ×2
SUT SILK 2 0 SH (SUTURE) ×4 IMPLANT
SUT STRATAFIX 0 PDS+ CT-2 23 (SUTURE) ×2
SUT V-LOC 90 ABS 3-0 VLT  V-20 (SUTURE) ×2
SUT V-LOC 90 ABS 3-0 VLT V-20 (SUTURE) ×1 IMPLANT
SUT VIC AB 3-0 SH 27 (SUTURE) ×2
SUT VIC AB 3-0 SH 27X BRD (SUTURE) ×1 IMPLANT
SUT VICRYL 0 UR6 27IN ABS (SUTURE) ×4 IMPLANT
SUT VLOC 90 S/L VL9 GS22 (SUTURE) ×2 IMPLANT
SUTURE MNCRL 4-0 27XMF (SUTURE) ×2 IMPLANT
SUTURE STRATFX 0 PDS+ CT-2 23 (SUTURE) ×1 IMPLANT
SYR 30ML LL (SYRINGE) ×2 IMPLANT
SYS BAG RETRIEVAL 10MM (BASKET)
SYSTEM BAG RETRIEVAL 10MM (BASKET) IMPLANT
TRAP FLUID SMOKE EVACUATOR (MISCELLANEOUS) ×2 IMPLANT
TRAY FOLEY SLVR 16FR LF STAT (SET/KITS/TRAYS/PACK) ×2 IMPLANT
TROCAR XCEL NON-BLD 5MMX100MML (ENDOMECHANICALS) ×2 IMPLANT
TUBING EVAC SMOKE HEATED PNEUM (TUBING) ×2 IMPLANT
WATER STERILE IRR 500ML POUR (IV SOLUTION) ×1 IMPLANT

## 2022-07-05 NOTE — Anesthesia Postprocedure Evaluation (Signed)
Anesthesia Post Note  Patient: TAJANAY HURLEY  Procedure(s) Performed: INSERTION OF MESH XI ROBOTIC ASSISTED PARAESOPHAGEAL HERNIA REPAIR  Patient location during evaluation: PACU Anesthesia Type: General Level of consciousness: awake and alert, oriented and patient cooperative Pain management: pain level controlled Vital Signs Assessment: post-procedure vital signs reviewed and stable Respiratory status: spontaneous breathing, nonlabored ventilation and respiratory function stable Cardiovascular status: blood pressure returned to baseline and stable Postop Assessment: adequate PO intake Anesthetic complications: no Comments: Pain in bilateral shoulders 2/2 abdomen insufflation.  Postop ECG unchanged from prior.   No notable events documented.   Last Vitals:  Vitals:   07/05/22 1245 07/05/22 1300  BP: 113/60 115/70  Pulse: 73 70  Resp: 19 17  Temp:    SpO2: 93% 93%    Last Pain:  Vitals:   07/05/22 1230  TempSrc:   PainSc: 0-No pain                 Darrin Nipper

## 2022-07-05 NOTE — Anesthesia Procedure Notes (Signed)
Procedure Name: Intubation Date/Time: 07/05/2022 7:41 AM  Performed by: Adalberto Ill, CRNAPre-anesthesia Checklist: Patient identified, Emergency Drugs available, Suction available, Timeout performed and Patient being monitored Patient Re-evaluated:Patient Re-evaluated prior to induction Oxygen Delivery Method: Circle system utilized Preoxygenation: Pre-oxygenation with 100% oxygen Induction Type: IV induction Ventilation: Mask ventilation without difficulty Laryngoscope Size: Miller and 2 Grade View: Grade I Tube type: Oral Tube size: 6.5 mm Number of attempts: 1 Airway Equipment and Method: Stylet Placement Confirmation: ETT inserted through vocal cords under direct vision, positive ETCO2 and breath sounds checked- equal and bilateral Secured at: 19 cm Tube secured with: Tape Dental Injury: Teeth and Oropharynx as per pre-operative assessment  Comments: Brief atraumatic dentition unchanged

## 2022-07-05 NOTE — Anesthesia Preprocedure Evaluation (Addendum)
Anesthesia Evaluation  Patient identified by MRN, date of birth, ID band Patient awake    Reviewed: Allergy & Precautions, NPO status , Patient's Chart, lab work & pertinent test results  History of Anesthesia Complications Negative for: history of anesthetic complications  Airway Mallampati: II   Neck ROM: Full    Dental  (+) Missing, Chipped,    Pulmonary asthma , former smoker (quit 1981)   Pulmonary exam normal breath sounds clear to auscultation       Cardiovascular hypertension, Normal cardiovascular exam+ Valvular Problems/Murmurs MVP  Rhythm:Regular Rate:Normal  ECG 06/10/22:  Sinus rhythm 1st degree AV block  Anteroseptal infarct (cited on or before 31-Dec-2020)  Abnormal ECG  When compared with ECG of 27-Apr-2021 14:10,  No significant change was found    Neuro/Psych  Headaches PSYCHIATRIC DISORDERS Anxiety Depression    Takes methadone 5 mg daily for restless legs; last dose 07/05/22    GI/Hepatic ,GERD (Barrett esophagus)  ,,  Endo/Other  negative endocrine ROS    Renal/GU negative Renal ROS     Musculoskeletal  (+) Arthritis ,    Abdominal   Peds  Hematology S/p splenectomy   Anesthesia Other Findings   Reproductive/Obstetrics                             Anesthesia Physical Anesthesia Plan  ASA: 3  Anesthesia Plan: General   Post-op Pain Management:    Induction: Intravenous  PONV Risk Score and Plan: 3 and Ondansetron, Dexamethasone and Treatment may vary due to age or medical condition  Airway Management Planned: Oral ETT  Additional Equipment:   Intra-op Plan:   Post-operative Plan: Extubation in OR  Informed Consent: I have reviewed the patients History and Physical, chart, labs and discussed the procedure including the risks, benefits and alternatives for the proposed anesthesia with the patient or authorized representative who has indicated his/her  understanding and acceptance.     Dental advisory given  Plan Discussed with: CRNA  Anesthesia Plan Comments: (Patient consented for risks of anesthesia including but not limited to:  - adverse reactions to medications - damage to eyes, teeth, lips or other oral mucosa - nerve damage due to positioning  - sore throat or hoarseness - damage to heart, brain, nerves, lungs, other parts of body or loss of life  Informed patient about role of CRNA in peri- and intra-operative care.  Patient voiced understanding.)        Anesthesia Quick Evaluation

## 2022-07-05 NOTE — Transfer of Care (Signed)
Immediate Anesthesia Transfer of Care Note  Patient: Heidi Hernandez  Procedure(s) Performed: INSERTION OF MESH XI ROBOTIC ASSISTED PARAESOPHAGEAL HERNIA REPAIR  Patient Location: PACU  Anesthesia Type:General  Level of Consciousness: drowsy and patient cooperative  Airway & Oxygen Therapy: Patient Spontanous Breathing and Patient connected to face mask oxygen  Post-op Assessment: Report given to RN and Post -op Vital signs reviewed and stable  Post vital signs: stable  Last Vitals:  Vitals Value Taken Time  BP 124/61 07/05/22 1154  Temp    Pulse 70 07/05/22 1200  Resp 18 07/05/22 1200  SpO2 100 % 07/05/22 1200  Vitals shown include unvalidated device data.  Last Pain:  Vitals:   07/05/22 0628  TempSrc: Temporal  PainSc: 0-No pain         Complications: No notable events documented.

## 2022-07-05 NOTE — Op Note (Signed)
Robotic assisted laparoscopic repair of  Right posterior diaphragmatic hernia and paraesophageal  hernia with Bio-A Mesh and Toupet fundoplication  Pre-operative Diagnosis: Diaphragmatic hernia Right posterior   Post-operative Diagnosis: same  Procedure:  Robotic assisted laparoscopic repair of  Right posterior diaphragmatic hernia and paraesophageal  hernia with Bio-A Mesh and Toupet fundoplication  Surgeon: Caroleen Hamman, MD FACS  Assistant: Gladstone Lighter RNFA . Required due to the complexity of the case the need for exposure and lack of first assist.  Anesthesia: Gen. with endotracheal tube  Findings: Right posterior small Diaphragmatic hernia Type I paraesophageal hernia  Loose wrap 270 degree over 50 FR Bougie Endometrial implants throughout the abdominal cavity  Estimated Blood Loss: 25cc       Specimens: sac           Complications: none   Procedure Details  The patient was seen again in the Holding Room. The benefits, complications, treatment options, and expected outcomes were discussed with the patient. The risks of bleeding, infection, recurrence of symptoms, failure to resolve symptoms,  esophageal damage, Dysphagia, bowel injury, any of which could require further surgery were reviewed with the patient. The likelihood of improving the patient's symptoms with return to their baseline status is good.  The patient and/or family concurred with the proposed plan, giving informed consent.  The patient was taken to Operating Room, identified  and the procedure verified.  A Time Out was held and the above information confirmed.  Prior to the induction of general anesthesia, antibiotic prophylaxis was administered. VTE prophylaxis was in place. General endotracheal anesthesia was then administered and tolerated well. After the induction, the abdomen was prepped with Chloraprep and draped in the sterile fashion. The patient was positioned in the lazy lateral decubitus position  using a bean bag and all pressure points padded..  Cut down technique was used to enter the abdominal cavity and a Hasson trochar was placed after two vicryl stitches were anchored to the fascia. Pneumoperitoneum was then created with CO2 and tolerated well without any adverse changes in the patient's vital signs.  Three 8-mm ports were placed under direct vision. All skin incisions  were infiltrated with a local anesthetic agent before making the incision and placing the trocars. An additional 5 mm regular laparoscopic port was placed to assist with retraction and exposure.   The patient was positioned  in reverse Trendelenburg, robot was brought to the surgical field and docked in the standard fashion.  We made sure all the instrumentation was kept indirect view at all times and that there were no collision between the arms. I scrubbed out and went to the console.  I used a robotic arm to retract the liver, the vessel sealer on my right hand and a forced bipolar grasper on my left hand.  An additional long the extra 5 mm port allow me ample exposure and the ability to perform meticulous dissection  We Started with lysis of adhesions with sharp scissors, she had significant endometrial implants. We were able to mobilize the liver fro the adjacent peritoneum.  I divided the lesser omentum via the pars flaccida.  I was able to skeletonized the IVC and the right crus. After extensive mobilization and meticulous dissection we were able to identified a small weakness/ defect to the right of the right crus and to the left of the IVC, there was no significant sac to be reduced. We also visualized a sliding hiatal hernia.  We Were able to dissect the lesser curvature  of the stomach and  dissected the fundus free from the right and left crus.  We circumferentially dissected the GE junction.  The hernia sac was also completely reduced and we were able to bring the stomach into the intra-abdominal position.   Attention then was turned to the greater curvature where the short gastrics were divided with sealer device.  We were able to identify the left crus and again were able to make sure there was a good circumferential dissection and that the hernia sac was completely excised.  We did perform a good dissection within the mediastinum to allow a complete reduction of the sac, gain esophageal length and a to completely allow an intra-abdominal fundoplication.  Using two strips of Bio-A as pledgets we approximated the Right crus to the lateral edge of the diaphgramgatic hernia defect with a 2-0V lock suture. I also reinforced the crus by using additional bio-a strips and 2-0 v-lock to approximate the hiatus. A bio-A 10x7 cm mesh was inserted and secured using Vistaseal in a keyhole fashion covering both the diaphragmatic and hiatal defects.   We Asked anesthesia to place a 50 French bougie and this went easily.  We also observe trajectory of the bougie. 270 degree  fundoplication was created with multiple 2-0 silk sutures and we placed 3 stitches taking some of the esophagus within that bite.  The fundoplication measured approximately 3-1/2 cm and  was floppy. I was very happy with the way the fundoplication laid and the repair of the hernia.  Inspection of the  upper quadrant was performed. No bleeding, bile  Or esophageal injuries leaks, or bowel injuries were noted. Robotic instruments and robotic arms were undocked in the standard fashion. All the needles were removed under direct visualization.   I scrubbed back in.  Pneumoperitoneum was released.  The periumbilical port site was closed with interrumpted 0 Vicryl sutures. 4-0 subcuticular Monocryl was used to close the skin. Liposomal marcaine was injected to all the incisions sites.  Dermabond was  applied.  The patient was then extubated and brought to the recovery room in stable condition. Sponge, lap, and needle counts were correct at closure and at  the conclusion of the case.               Sterling Big, MD, FACS

## 2022-07-05 NOTE — Discharge Instructions (Signed)
In addition to included general post-operative instructions ,  Diet: Recommend following Nissen diet recommendations for a minimum of 4 weeks. I will provide you with a handout regarding this.   Activity: No heavy lifting >20 pounds (children, pets, laundry, garbage) or strenuous activity for 4 weeks from date of surgery, but light activity and walking are encouraged. Do not drive or drink alcohol if taking narcotic pain medications or having pain that might distract from driving.  Wound care: You may shower/get incision wet with soapy water and pat dry (do not rub incisions), but no baths or submerging incision underwater until follow-up.   Medications: Resume all home medications. For mild to moderate pain: acetaminophen (Tylenol) or ibuprofen/naproxen (if no kidney disease). Combining Tylenol with alcohol can substantially increase your risk of causing liver disease. Narcotic pain medications, if prescribed, can be used for severe pain, though may cause nausea, constipation, and drowsiness. Do not combine Tylenol and Percocet (or similar) within a 6 hour period as Percocet (and similar) contain(s) Tylenol. If you do not need the narcotic pain medication, you do not need to fill the prescription.  Call office [phone] / [phone]) at any time if any questions, worsening pain, fevers/chills, bleeding, drainage from incision site, or other concerns.

## 2022-07-05 NOTE — Progress Notes (Signed)
Called and notified dr pabon of fever 101.6 after toradol at about 130 pm and tylenol 1630.  Also notified anesthesia who is not concerned about MH.

## 2022-07-05 NOTE — Interval H&P Note (Signed)
History and Physical Interval Note:  07/05/2022 7:21 AM  Heidi Hernandez  has presented today for surgery, with the diagnosis of diaphragmatic hernia.  The various methods of treatment have been discussed with the patient and family. After consideration of risks, benefits and other options for treatment, the patient has consented to  Procedure(s): XI ROBOTIC ASSISTED Diaphragmatic hernia repair right.RNFA to assist (N/A) as a surgical intervention.  The patient's history has been reviewed, patient examined, no change in status, stable for surgery.  I have reviewed the patient's chart and labs.  Questions were answered to the patient's satisfaction.     [place]

## 2022-07-05 NOTE — Progress Notes (Signed)
Assumed care of this patient from [place], pt coing of gas pains but no abdominal pain, Torodol given .

## 2022-07-05 NOTE — Addendum Note (Signed)
Addendum  created 07/05/22 1355 by Adalberto Ill, CRNA   Intraprocedure Event edited

## 2022-07-06 ENCOUNTER — Observation Stay: Payer: Medicare HMO

## 2022-07-06 DIAGNOSIS — Z87891 Personal history of nicotine dependence: Secondary | ICD-10-CM | POA: Diagnosis not present

## 2022-07-06 DIAGNOSIS — I10 Essential (primary) hypertension: Secondary | ICD-10-CM | POA: Diagnosis not present

## 2022-07-06 DIAGNOSIS — K440 Diaphragmatic hernia with obstruction, without gangrene: Secondary | ICD-10-CM | POA: Diagnosis not present

## 2022-07-06 DIAGNOSIS — K2289 Other specified disease of esophagus: Secondary | ICD-10-CM | POA: Diagnosis not present

## 2022-07-06 DIAGNOSIS — J45909 Unspecified asthma, uncomplicated: Secondary | ICD-10-CM | POA: Diagnosis not present

## 2022-07-06 DIAGNOSIS — Z96611 Presence of right artificial shoulder joint: Secondary | ICD-10-CM | POA: Diagnosis not present

## 2022-07-06 LAB — PREPARE PLATELET PHERESIS: Unit division: 0

## 2022-07-06 LAB — CBC
HCT: 35.3 % — ABNORMAL LOW (ref 36.0–46.0)
Hemoglobin: 12.1 g/dL (ref 12.0–15.0)
MCH: 32.3 pg (ref 26.0–34.0)
MCHC: 34.3 g/dL (ref 30.0–36.0)
MCV: 94.1 fL (ref 80.0–100.0)
Platelets: 118 10*3/uL — ABNORMAL LOW (ref 150–400)
RBC: 3.75 MIL/uL — ABNORMAL LOW (ref 3.87–5.11)
RDW: 14.6 % (ref 11.5–15.5)
WBC: 11.6 10*3/uL — ABNORMAL HIGH (ref 4.0–10.5)
nRBC: 0 % (ref 0.0–0.2)

## 2022-07-06 LAB — BASIC METABOLIC PANEL
Anion gap: 7 (ref 5–15)
BUN: 20 mg/dL (ref 8–23)
CO2: 23 mmol/L (ref 22–32)
Calcium: 8.2 mg/dL — ABNORMAL LOW (ref 8.9–10.3)
Chloride: 109 mmol/L (ref 98–111)
Creatinine, Ser: 0.67 mg/dL (ref 0.44–1.00)
GFR, Estimated: >60 mL/min (ref >60)
Glucose, Bld: 108 mg/dL — ABNORMAL HIGH (ref 70–99)
Potassium: 4 mmol/L (ref 3.5–5.1)
Sodium: 139 mmol/L (ref 135–145)

## 2022-07-06 LAB — BPAM PLATELET PHERESIS
Blood Product Expiration Date: 202401102359
Unit Type and Rh: 6200

## 2022-07-06 LAB — PHOSPHORUS: Phosphorus: 4 mg/dL (ref 2.5–4.6)

## 2022-07-06 LAB — MAGNESIUM: Magnesium: 2.2 mg/dL (ref 1.7–2.4)

## 2022-07-06 MED ORDER — IOHEXOL 300 MG/ML  SOLN
150.0000 mL | Freq: Once | INTRAMUSCULAR | Status: AC | PRN
  Administered 2022-07-06: 100 mL via ORAL

## 2022-07-06 MED ORDER — ATENOLOL 50 MG PO TABS
ORAL_TABLET | ORAL | Status: AC
  Administered 2022-07-06: 25 mg via ORAL
  Filled 2022-07-06: qty 1

## 2022-07-06 MED ORDER — LORATADINE 10 MG PO TABS
ORAL_TABLET | ORAL | Status: AC
  Administered 2022-07-06: 10 mg via ORAL
  Filled 2022-07-06: qty 1

## 2022-07-06 MED ORDER — ACETAMINOPHEN 500 MG PO TABS
ORAL_TABLET | ORAL | Status: AC
  Filled 2022-07-06: qty 2

## 2022-07-06 MED ORDER — OXYCODONE HCL 5 MG PO TABS
ORAL_TABLET | ORAL | Status: AC
  Filled 2022-07-06: qty 2

## 2022-07-06 MED ORDER — ACETAMINOPHEN 500 MG PO TABS
ORAL_TABLET | ORAL | Status: AC
  Administered 2022-07-06: 1000 mg via ORAL
  Filled 2022-07-06: qty 2

## 2022-07-06 MED ORDER — LORAZEPAM 0.5 MG PO TABS
0.5000 mg | ORAL_TABLET | Freq: Four times a day (QID) | ORAL | Status: DC | PRN
  Administered 2022-07-06: 0.5 mg via ORAL
  Filled 2022-07-06: qty 1

## 2022-07-06 MED ORDER — IOHEXOL 300 MG/ML  SOLN
150.0000 mL | Freq: Once | INTRAMUSCULAR | Status: AC | PRN
  Administered 2022-07-06: 75 mL via ORAL

## 2022-07-06 MED ORDER — ENOXAPARIN SODIUM 40 MG/0.4ML IJ SOSY
PREFILLED_SYRINGE | INTRAMUSCULAR | Status: AC
  Administered 2022-07-06: 40 mg via SUBCUTANEOUS
  Filled 2022-07-06: qty 0.4

## 2022-07-06 MED ORDER — OXYCODONE HCL 5 MG PO TABS
ORAL_TABLET | ORAL | Status: AC
  Administered 2022-07-06: 5 mg via ORAL
  Filled 2022-07-06: qty 1

## 2022-07-06 NOTE — Progress Notes (Signed)
[name]   Post op day(s): 1 Day Post-Op.   Interval History:  Patient seen and examined She did have fever overnight with T-max of 101.26F at [place] this AM Patient reports she is having a rough morning Lot of discomfort from insufflation; no abdominal pain She does feel trouble swallowing with pills and sips of liquids No fever, chills, nausea, emesis  Leukocytosis 11.6K Renal function normal; sCr - 0.67; UO - 1990 ccs She is on FLD  Vital signs in last 24 hours: [min-max] current  Temp:  [98 F (36.7 C)-101.6 F (38.7 C)] 98.3 F (36.8 C) (01/10 0357) Pulse Rate:  [64-83] 64 (01/10 0357) Resp:  [14-19] 14 (01/10 0357) BP: (106-131)/(45-83) 131/63 (01/10 0357) SpO2:  [76 %-100 %] 95 % (01/10 0357)     Height: 5\' 4"  (162.6 cm) Weight: 56.7 kg BMI (Calculated): 21.45   Intake/Output last 2 shifts:  01/09 0701 - 01/10 0700 In: 2550 [I.V.:2250; IV Piggyback:300] Out: 2005 [Urine:1990; Blood:15]   Physical Exam:  Constitutional: alert, cooperative and no distress  Respiratory: breathing non-labored at rest  Cardiovascular: regular rate and sinus rhythm  Gastrointestinal: soft, non-tender, and non-distended Integumentary: laparoscopic incisions are CDI with dermabond; early ecchymosis; no erythema or drainage   Labs:     Latest Ref Rng & Units 07/06/2022    6:24 AM 07/05/2022    3:03 PM 06/29/2022    2:02 PM  CBC  WBC 4.0 - 10.5 K/uL 11.6  13.1  9.8   Hemoglobin 12.0 - 15.0 g/dL 12.1  12.4  13.1   Hematocrit 36.0 - 46.0 % 35.3  36.6  38.6   Platelets 150 - 400 K/uL 118  136  185       Latest Ref Rng & Units 07/05/2022    3:03 PM 06/29/2022    2:02 PM 04/27/2022    1:26 PM  CMP  Glucose 70 - 99 mg/dL  99  100   BUN 8 - 23 mg/dL  23  18   Creatinine 0.44 - 1.00 mg/dL 0.78  0.62  0.54   Sodium 135 - 145 mmol/L  139  138   Potassium 3.5 - 5.1 mmol/L  3.4  3.8   Chloride 98 - 111 mmol/L  107  108   CO2  22 - 32 mmol/L  25  25   Calcium 8.9 - 10.3 mg/dL  8.8  8.8   Total Protein 6.5 - 8.1 g/dL  7.2  7.4   Total Bilirubin 0.3 - 1.2 mg/dL  0.6  0.3   Alkaline Phos 38 - 126 U/L  60  55   AST 15 - 41 U/L  26  24   ALT 0 - 44 U/L  18  16      Imaging studies: No new pertinent imaging studies   Assessment/Plan: 72 y.o. female with fever overnight, unsure source ? Atelectasis in immediate post-op period, otherwise doing well 1 Day Post-Op s/p robotic assisted laparoscopic repair of right posterior diaphragmatic hernia, paraesophageal hernia, and 102 degree fundoplication.    - Will get UGI this AM for assessment of repair/reassurance  - Unsure of source of fever immediately post-op; afebrile this AM; labs and exam are reassuring - Continue FLD for now; will review Nissen diet recommendations - Monitor abdominal examination; on-going bowel function - Pain control prn; antiemetics prn   - Okay to mobilize    - Discharge Planning: Given fever overnight, and patient without  power/heat at home due to strom, I think it would be prudent to keep her another 24 hours to ensure she continues to do well without continued fevers.    All of the above findings and recommendations were discussed with the patient, and the medical team, and all of patient's questions were answered to her expressed satisfaction.  -- Edison Simon, PA-C [facility] 07/06/2022, 7:19 AM M-F: 7am - 4pm

## 2022-07-07 DIAGNOSIS — Z87891 Personal history of nicotine dependence: Secondary | ICD-10-CM | POA: Diagnosis not present

## 2022-07-07 DIAGNOSIS — Z96611 Presence of right artificial shoulder joint: Secondary | ICD-10-CM | POA: Diagnosis not present

## 2022-07-07 DIAGNOSIS — J45909 Unspecified asthma, uncomplicated: Secondary | ICD-10-CM | POA: Diagnosis not present

## 2022-07-07 DIAGNOSIS — K440 Diaphragmatic hernia with obstruction, without gangrene: Secondary | ICD-10-CM | POA: Diagnosis not present

## 2022-07-07 DIAGNOSIS — I10 Essential (primary) hypertension: Secondary | ICD-10-CM | POA: Diagnosis not present

## 2022-07-07 LAB — CBC
HCT: 37.7 % (ref 36.0–46.0)
Hemoglobin: 13 g/dL (ref 12.0–15.0)
MCH: 32.3 pg (ref 26.0–34.0)
MCHC: 34.5 g/dL (ref 30.0–36.0)
MCV: 93.8 fL (ref 80.0–100.0)
Platelets: 123 10*3/uL — ABNORMAL LOW (ref 150–400)
RBC: 4.02 MIL/uL (ref 3.87–5.11)
RDW: 14.4 % (ref 11.5–15.5)
WBC: 12.9 10*3/uL — ABNORMAL HIGH (ref 4.0–10.5)
nRBC: 0 % (ref 0.0–0.2)

## 2022-07-07 MED ORDER — OXYCODONE HCL 5 MG PO TABS
ORAL_TABLET | ORAL | Status: AC
  Administered 2022-07-07: 5 mg via ORAL
  Filled 2022-07-07: qty 1

## 2022-07-07 MED ORDER — OXYCODONE HCL 5 MG PO TABS: 5.0000 mg | ORAL_TABLET | Freq: Four times a day (QID) | ORAL | 0 refills | Status: DC | PRN

## 2022-07-07 MED ORDER — ACETAMINOPHEN 500 MG PO TABS
ORAL_TABLET | ORAL | Status: AC
  Administered 2022-07-07: 1000 mg via ORAL
  Filled 2022-07-07: qty 2

## 2022-07-07 MED ORDER — ATENOLOL 50 MG PO TABS
ORAL_TABLET | ORAL | Status: AC
  Administered 2022-07-07: 25 mg via ORAL
  Filled 2022-07-07: qty 1

## 2022-07-07 MED ORDER — ENOXAPARIN SODIUM 40 MG/0.4ML IJ SOSY
PREFILLED_SYRINGE | INTRAMUSCULAR | Status: AC
  Administered 2022-07-07: 40 mg via SUBCUTANEOUS
  Filled 2022-07-07: qty 0.4

## 2022-07-07 MED ORDER — IBUPROFEN 600 MG PO TABS: 600.0000 mg | ORAL_TABLET | Freq: Four times a day (QID) | ORAL | 0 refills | Status: DC | PRN

## 2022-07-07 MED ORDER — ONDANSETRON HCL 4 MG/2ML IJ SOLN
INTRAMUSCULAR | Status: AC
  Administered 2022-07-07: 4 mg via INTRAVENOUS
  Filled 2022-07-07: qty 2

## 2022-07-07 MED ORDER — LORATADINE 10 MG PO TABS
ORAL_TABLET | ORAL | Status: AC
  Administered 2022-07-07: 10 mg via ORAL
  Filled 2022-07-07: qty 1

## 2022-07-07 MED ORDER — ONDANSETRON 4 MG PO TBDP: 4.0000 mg | ORAL_TABLET | Freq: Three times a day (TID) | ORAL | 0 refills | Status: DC | PRN

## 2022-07-07 NOTE — Discharge Summary (Addendum)
[facility] SURGICAL DISCHARGE SUMMARY  Patient ID: Heidi Hernandez MRN: [id number] DOB/AGE: 08/02/1950 72 y.o.  Admit date: 07/05/2022 Discharge date: 07/07/2022  Discharge Diagnoses Patient Active Problem List   Diagnosis Date Noted   S/P repair of paraesophageal hernia 07/05/2022   Diaphragmatic hernia without obstruction and without gangrene 07/05/2022   Bochdalek hernia 05/21/2022    Consultants None  Procedures 07/05/2021: Robotic assisted laparoscopic right posterior diaphragmatic hernia repair, paraesophageal hernia repair, and 270 degree Toupet Fundoplication   HPI: Heidi Hernandez is a 72 y.o. female with history of right sided Bochdalek hernia and paraesophageal hernia who presents to [facility] on 01/09 for scheduled repair.   Hospital Course: Informed consent was obtained and documented, and patient underwent uneventful Robotic assisted laparoscopic right posterior diaphragmatic hernia repair, paraesophageal hernia repair, and 270 degree Toupet Fundoplication  (Dr Dahlia Byes, 07/05/2021).  Post-operatively, patient did interestingly have a fever immediately post-op but this was isolate and did not recur. No infectious source. She did have UGI on POD1 which showed expected edematous changes but otherwise was reassuring without leak. Advancement of patient's diet and ambulation were well-tolerated. The remainder of patient's hospital course was essentially unremarkable, and discharge planning was initiated accordingly with patient safely able to be discharged home with appropriate discharge instructions, pain control, and outpatient follow-up after all of her and family's questions were answered to their expressed satisfaction.   Discharge Condition: Good   Physical Examination:  Constitutional: alert, cooperative and no distress  Respiratory: breathing non-labored at rest  Cardiovascular: regular rate and sinus rhythm  Gastrointestinal: soft, non-tender, and  non-distended Integumentary: laparoscopic incisions are CDI with dermabond; early ecchymosis; no erythema or drainage    Allergies as of 07/07/2022       Reactions   Augmentin [amoxicillin-pot Clavulanate] Nausea And Vomiting   Cat Hair Extract    Other reaction(s): Not available   Dog Epithelium Allergy Skin Test    Other reaction(s): Not available   Droperidol Other (See Comments)   "Locked jaw" Facial muscles locked "Locked jaw"   Molds & Smuts    Other reaction(s): Not available   Paroxetine Hcl Other (See Comments)   syncope Passed out    Tree Extract    Other reaction(s): Not available   Morphine Itching        Medication List     TAKE these medications    albuterol 108 (90 Base) MCG/ACT inhaler Commonly known as: Ventolin HFA Inhale 2 puffs into the lungs every 6 (six) hours as needed for wheezing. May substitute generic or equivalent brand name   alendronate 70 MG tablet Commonly known as: FOSAMAX TAKE 1 TABLET BY MOUTH ONCE WEEKLY ON AN EMPTY STOMACH BEFORE BREAKFAST. REMAIN UPRIGHT FOR 30 MINUTES AND TAKE WITH 8 OUNCES OF WATER   ALLEGRA PO Take 1 capsule by mouth daily.   amLODipine 10 MG tablet Commonly known as: NORVASC Take 1 tablet (10 mg total) by mouth daily.   atenolol 25 MG tablet Commonly known as: TENORMIN Take 1 tablet (25 mg total) by mouth daily.   azelastine 0.1 % nasal spray Commonly known as: ASTELIN Place 2 sprays into both nostrils as needed.   EPINEPHrine 0.3 mg/0.3 mL Soaj injection Commonly known as: EPI-PEN as needed.   fluticasone 44 MCG/ACT inhaler Commonly known as: FLOVENT HFA Inhale 1 puff into the lungs daily.   fluticasone 50 MCG/ACT nasal spray Commonly known as: FLONASE Place 2 sprays into both nostrils as needed for allergies.   ibuprofen  600 MG tablet Commonly known as: ADVIL Take 1 tablet (600 mg total) by mouth every 6 (six) hours as needed.   methadone 5 MG tablet Commonly known as: DOLOPHINE Take  5 mg by mouth daily.   montelukast 10 MG tablet Commonly known as: SINGULAIR Take 10 mg by mouth at bedtime.   omeprazole 40 MG capsule Commonly known as: PRILOSEC Take 40 mg by mouth daily.   ondansetron 4 MG disintegrating tablet Commonly known as: Zofran ODT Take 1 tablet (4 mg total) by mouth every 8 (eight) hours as needed for nausea or vomiting.   oxyCODONE 5 MG immediate release tablet Commonly known as: Oxy IR/ROXICODONE Take 1 tablet (5 mg total) by mouth every 6 (six) hours as needed for severe pain or breakthrough pain.   pramipexole 0.5 MG tablet Commonly known as: MIRAPEX Take 1 tablet by mouth in the morning and at bedtime. 4 pm and 9pm daily   SUMAtriptan 100 MG tablet Commonly known as: IMITREX TAKE 1 TABLET BY MOUTH AT ONSET OF HEADACHE. MAY TAKE AN ADDITIONAL TABLET IN 2 HOURS IF NEEDED. MAX OF 200mg /24hr   Vitamin D (Ergocalciferol) 1.25 MG (50000 UNIT) Caps capsule Commonly known as: DRISDOL Take 1 capsule (50,000 Units total) by mouth once a week.          Follow-up Information     Pabon, [place] F, MD. Schedule an appointment as soon as possible for a visit in 2 week(s).   Specialty: General Surgery Why: 2-3 week follow up; s/p diaphragmatic hernia repair Contact information: [address] [place] [address] [phone]                  Time spent on discharge management including discussion of hospital course, clinical condition, outpatient instructions, prescriptions, and follow up with the patient and members of the medical team: >30 minutes  -- Edison Simon , PA-C [facility]  07/07/2022, 9:00 AM [phone] M-F: 7am - 4pm

## 2022-07-07 NOTE — Progress Notes (Signed)
Nsg Discharge Note  Admit Date:  07/05/2022 Discharge date: 07/07/2022   Heidi Hernandez to be D/C'd Home per MD order.  AVS completed.   Patient/caregiver able to verbalize understanding.  Discharge Medication: Allergies as of 07/07/2022       Reactions   Augmentin [amoxicillin-pot Clavulanate] Nausea And Vomiting   Cat Hair Extract    Other reaction(s): Not available   Dog Epithelium Allergy Skin Test    Other reaction(s): Not available   Droperidol Other (See Comments)   "Locked jaw" Facial muscles locked "Locked jaw"   Molds & Smuts    Other reaction(s): Not available   Paroxetine Hcl Other (See Comments)   syncope Passed out    Tree Extract    Other reaction(s): Not available   Morphine Itching        Medication List     TAKE these medications    albuterol 108 (90 Base) MCG/ACT inhaler Commonly known as: Ventolin HFA Inhale 2 puffs into the lungs every 6 (six) hours as needed for wheezing. May substitute generic or equivalent brand name   alendronate 70 MG tablet Commonly known as: FOSAMAX TAKE 1 TABLET BY MOUTH ONCE WEEKLY ON AN EMPTY STOMACH BEFORE BREAKFAST. REMAIN UPRIGHT FOR 30 MINUTES AND TAKE WITH 8 OUNCES OF WATER   ALLEGRA PO Take 1 capsule by mouth daily.   amLODipine 10 MG tablet Commonly known as: NORVASC Take 1 tablet (10 mg total) by mouth daily.   atenolol 25 MG tablet Commonly known as: TENORMIN Take 1 tablet (25 mg total) by mouth daily.   azelastine 0.1 % nasal spray Commonly known as: ASTELIN Place 2 sprays into both nostrils as needed.   EPINEPHrine 0.3 mg/0.3 mL Soaj injection Commonly known as: EPI-PEN as needed.   fluticasone 44 MCG/ACT inhaler Commonly known as: FLOVENT HFA Inhale 1 puff into the lungs daily.   fluticasone 50 MCG/ACT nasal spray Commonly known as: FLONASE Place 2 sprays into both nostrils as needed for allergies.   ibuprofen 600 MG tablet Commonly known as: ADVIL Take 1 tablet (600 mg total) by mouth  every 6 (six) hours as needed.   methadone 5 MG tablet Commonly known as: DOLOPHINE Take 5 mg by mouth daily.   montelukast 10 MG tablet Commonly known as: SINGULAIR Take 10 mg by mouth at bedtime.   omeprazole 40 MG capsule Commonly known as: PRILOSEC Take 40 mg by mouth daily.   ondansetron 4 MG disintegrating tablet Commonly known as: Zofran ODT Take 1 tablet (4 mg total) by mouth every 8 (eight) hours as needed for nausea or vomiting.   oxyCODONE 5 MG immediate release tablet Commonly known as: Oxy IR/ROXICODONE Take 1 tablet (5 mg total) by mouth every 6 (six) hours as needed for severe pain or breakthrough pain.   pramipexole 0.5 MG tablet Commonly known as: MIRAPEX Take 1 tablet by mouth in the morning and at bedtime. 4 pm and 9pm daily   SUMAtriptan 100 MG tablet Commonly known as: IMITREX TAKE 1 TABLET BY MOUTH AT ONSET OF HEADACHE. MAY TAKE AN ADDITIONAL TABLET IN 2 HOURS IF NEEDED. MAX OF 200mg /24hr   Vitamin D (Ergocalciferol) 1.25 MG (50000 UNIT) Caps capsule Commonly known as: DRISDOL Take 1 capsule (50,000 Units total) by mouth once a week.        Discharge Assessment: Vitals:   07/07/22 0420 07/07/22 1038  BP: 134/64   Pulse: 64 70  Resp: 14   Temp: 98.4 F (36.9 C)   SpO2:  98% 97%   Skin clean, dry and intact without evidence of skin break down, no evidence of skin tears noted. IV catheter discontinued intact. Site without signs and symptoms of complications - no redness or edema noted at insertion site, patient denies c/o pain - only slight tenderness at site.  Dressing with slight pressure applied.  D/c Instructions-Education: Discharge instructions given to patient/family with verbalized understanding. D/c education completed with patient/family including follow up instructions, medication list, d/c activities limitations if indicated, with other d/c instructions as indicated by MD - patient able to verbalize understanding, all questions fully  answered. Patient instructed to return to ED, call 911, or call MD for any changes in condition.  Patient escorted via [place], and D/C home via private auto.  Tresa Endo, RN 07/07/2022 10:44 AM

## 2022-07-12 ENCOUNTER — Emergency Department: Payer: Medicare HMO

## 2022-07-12 ENCOUNTER — Ambulatory Visit: Payer: Self-pay

## 2022-07-12 ENCOUNTER — Emergency Department
Admission: EM | Admit: 2022-07-12 | Discharge: 2022-07-12 | Disposition: A | Discharge status: Home / Self Care | Payer: Medicare HMO | Attending: Emergency Medicine | Admitting: Emergency Medicine

## 2022-07-12 DIAGNOSIS — N2889 Other specified disorders of kidney and ureter: Secondary | ICD-10-CM | POA: Diagnosis not present

## 2022-07-12 DIAGNOSIS — R0602 Shortness of breath: Secondary | ICD-10-CM | POA: Diagnosis not present

## 2022-07-12 DIAGNOSIS — R059 Cough, unspecified: Secondary | ICD-10-CM | POA: Diagnosis not present

## 2022-07-12 DIAGNOSIS — R0789 Other chest pain: Secondary | ICD-10-CM | POA: Insufficient documentation

## 2022-07-12 DIAGNOSIS — K769 Liver disease, unspecified: Secondary | ICD-10-CM | POA: Diagnosis not present

## 2022-07-12 DIAGNOSIS — R079 Chest pain, unspecified: Secondary | ICD-10-CM | POA: Diagnosis not present

## 2022-07-12 DIAGNOSIS — K7689 Other specified diseases of liver: Secondary | ICD-10-CM | POA: Diagnosis not present

## 2022-07-12 DIAGNOSIS — J90 Pleural effusion, not elsewhere classified: Secondary | ICD-10-CM | POA: Diagnosis not present

## 2022-07-12 LAB — COMPREHENSIVE METABOLIC PANEL
ALT: 35 U/L (ref 0–44)
AST: 25 U/L (ref 15–41)
Albumin: 4.1 g/dL (ref 3.5–5.0)
Alkaline Phosphatase: 54 U/L (ref 38–126)
Anion gap: 9 (ref 5–15)
BUN: 19 mg/dL (ref 8–23)
CO2: 23 mmol/L (ref 22–32)
Calcium: 8.9 mg/dL (ref 8.9–10.3)
Chloride: 106 mmol/L (ref 98–111)
Creatinine, Ser: 0.56 mg/dL (ref 0.44–1.00)
GFR, Estimated: >60 mL/min (ref >60)
Glucose, Bld: 107 mg/dL — ABNORMAL HIGH (ref 70–99)
Potassium: 3.6 mmol/L (ref 3.5–5.1)
Sodium: 138 mmol/L (ref 135–145)
Total Bilirubin: 0.7 mg/dL (ref 0.3–1.2)
Total Protein: 7.5 g/dL (ref 6.5–8.1)

## 2022-07-12 LAB — CBC
HCT: 36.9 % (ref 36.0–46.0)
Hemoglobin: 12.8 g/dL (ref 12.0–15.0)
MCH: 31.8 pg (ref 26.0–34.0)
MCHC: 34.7 g/dL (ref 30.0–36.0)
MCV: 91.6 fL (ref 80.0–100.0)
Platelets: 176 10*3/uL (ref 150–400)
RBC: 4.03 MIL/uL (ref 3.87–5.11)
RDW: 14.1 % (ref 11.5–15.5)
WBC: 11.2 10*3/uL — ABNORMAL HIGH (ref 4.0–10.5)
nRBC: 0 % (ref 0.0–0.2)

## 2022-07-12 LAB — TROPONIN I (HIGH SENSITIVITY): Troponin I (High Sensitivity): 4 ng/L (ref <18)

## 2022-07-12 LAB — LACTIC ACID, PLASMA: Lactic Acid, Venous: 1 mmol/L (ref 0.5–1.9)

## 2022-07-12 MED ORDER — IOHEXOL 350 MG/ML SOLN
100.0000 mL | Freq: Once | INTRAVENOUS | Status: AC | PRN
  Administered 2022-07-12: 100 mL via INTRAVENOUS

## 2022-07-12 MED ORDER — ONDANSETRON HCL 4 MG/2ML IJ SOLN: 4.0000 mg | INTRAMUSCULAR | Status: DC

## 2022-07-12 NOTE — ED Notes (Signed)
Pt verbalized understanding of discharge instructions.

## 2022-07-12 NOTE — ED Provider Notes (Signed)
[facility] Provider Note    Event Date/Time   First MD Initiated Contact with Patient 07/12/22 1106     (approximate)   History   Chest Pain   HPI  Heidi Hernandez is a 72 y.o. female with recent repair of paraesophageal hernia and diaphragmatic hernia  She presents today she has been having a sensation of a "gurgling" feeling in her chest and a strange feeling of chest discomfort that is hard to describe and a very slight cough  She also reports a slight cough feeling or irritation feeling in the chest.  Denies abdominal pain.  Scars seem to be healing well.  No nausea or vomiting.  Patient reports she wants to make sure she is not "catching pneumonia" she reports she feels a weird gurgling sensation in the chest  She has had no fever.       Physical Exam   Triage Vital Signs: ED Triage Vitals  Enc Vitals Group     BP 07/12/22 0956 (!) 148/65     Pulse Rate 07/12/22 0956 71     Resp 07/12/22 0956 18     Temp 07/12/22 0956 97.9 F (36.6 C)     Temp Source 07/12/22 0956 Oral     SpO2 07/12/22 0956 97 %     Weight 07/12/22 1000 125 lb (56.7 kg)     Height 07/12/22 1000 5\' 4"  (1.626 m)     Head Circumference --      Peak Flow --      Pain Score --      Pain Loc --      Pain Edu? --      Excl. in GC? --     Most recent vital signs: Vitals:   07/12/22 1156 07/12/22 1156  BP: (!) 148/73   Pulse: 62   Resp: 17   Temp:  97.9 F (36.6 C)  SpO2: 98%      General: Awake, no distress.  CV:  Good peripheral perfusion.  Normal tones and rate Resp:  Normal effort.  Clear bilaterally Abd:  No distention.  Abdomen soft nontender nondistended.  Surgical wounds appear to be healing well with limited ecchymoses around them Other:  No lower extremity edema   ED Results / Procedures / Treatments   Labs (all labs ordered are listed, but only abnormal results are displayed) Labs Reviewed  CBC - Abnormal; Notable for the following  components:      Result Value   WBC 11.2 (*)    All other components within normal limits  COMPREHENSIVE METABOLIC PANEL - Abnormal; Notable for the following components:   Glucose, Bld 107 (*)    All other components within normal limits  LACTIC ACID, PLASMA  TROPONIN I (HIGH SENSITIVITY)     EKG  Interpreted by me at 10 AM heart rate 75 QRS 90 QTc 430 Normal sinus rhythm, no evidence of acute ischemia or ectopy.  Old anteroseptal Q waves in comparison to January 9 of this year   RADIOLOGY Chest x-ray interpreted by me as negative for acute finding with exception to a small right-sided pleural effusion  CT    CT Angio Chest PE W and/or Wo Contrast  Result Date: 07/12/2022 CLINICAL DATA:  Coughing gurgling sensation in the chest. Status post laparoscopic repair right posterior diaphragmatic hernia and paraesophageal hernia on 07/05/2022. Abdominal discomfort. EXAM: CT ANGIOGRAPHY CHEST CT ABDOMEN AND PELVIS WITH CONTRAST TECHNIQUE: Multidetector CT imaging of the chest was performed  using the standard protocol during bolus administration of intravenous contrast. Multiplanar CT image reconstructions and MIPs were obtained to evaluate the vascular anatomy. Multidetector CT imaging of the abdomen and pelvis was performed using the standard protocol during bolus administration of intravenous contrast. RADIATION DOSE REDUCTION: This exam was performed according to the departmental dose-optimization program which includes automated exposure control, adjustment of the mA and/or kV according to patient size and/or use of iterative reconstruction technique. CONTRAST:  OMNIPAQUE IOHEXOL 350 MG/ML SOLN COMPARISON:  Chest CT 05/17/2022. CTA chest abdomen pelvis 04/24/2019 FINDINGS: CTA CHEST FINDINGS Cardiovascular: Heart is enlarged. No substantial pericardial effusion. Mild atherosclerotic calcification is noted in the wall of the thoracic aorta. There is no filling defect within the opacified  pulmonary arteries to suggest the presence of an acute pulmonary embolus. Mediastinum/Nodes: No mediastinal lymphadenopathy. There is no hilar lymphadenopathy. The esophagus has normal imaging features. Small gas collections are seen adjacent to the distal esophagus (images 334/6 in 363/6). Mild circumferential wall thickening noted distal esophagus Lungs/Pleura: No suspicious pulmonary nodule or mass. No overt pulmonary edema. Compressive atelectasis noted in the dependent lung bases, right greater than left. No pleural effusion Musculoskeletal: No worrisome lytic or sclerotic osseous abnormality. Status post right shoulder replacement. Old left rib fractures evident. Review of the MIP images confirms the above findings. CT ABDOMEN and PELVIS FINDINGS Hepatobiliary: Scattered tiny hypodensities in the liver parenchyma are too small to characterize measuring up to 10 mm in the lateral segment left liver (image 20/series 2) some of these lesions approach water density but others have attenuation higher than expected for simple cyst. Some of these lesions are not definitely visible on the previous CT of 05/17/2022. Nonvisualization of the gallbladder suggest prior cholecystectomy. Mild common bile duct dilatation at 9 mm diameter, stable since 05/17/2022. Pancreas: No focal mass lesion. No dilatation of the main duct. No intraparenchymal cyst. No peripancreatic edema. Spleen: Prior splenectomy with residual splenules evident. Adrenals/Urinary Tract: No adrenal nodule or mass. Right kidney unremarkable. Tiny hypodensity lower pole left kidney is stable, likely a cyst. No followup imaging is recommended. No evidence for hydroureter. Trace gas identified in the bladder lumen (71/2). Stomach/Bowel: Distortion of the gastric cardia likely secondary to recent paraesophageal hernia repair. Tiny gas bubble on image 19/2 may be intraluminal but if not, would not be unexpected 7 days after surgery. Distal stomach unremarkable.  Duodenum is normally positioned as is the ligament of Treitz. No small bowel wall thickening. No small bowel dilatation. Mobile cecum is identified in the anterior upper abdomen with ileocecal bowel visible on image 41/2. Cecum was anterior to the liver on the 04/24/2019 exam. The appendix is not well visualized, but there is no edema or inflammation in the region of the cecum. Moderate to large stool volume noted right and transverse colon. Vascular/Lymphatic: There is mild atherosclerotic calcification of the abdominal aorta without aneurysm. There is no gastrohepatic or hepatoduodenal ligament lymphadenopathy. No retroperitoneal or mesenteric lymphadenopathy. No pelvic sidewall lymphadenopathy. Reproductive: Hysterectomy.  There is no adnexal mass. Other: Trace free fluid is seen in the pelvis. Musculoskeletal: Soft tissue gas is identified in the deep subcutaneous fat of the left anterior and lateral abdominal wall, superficial to the musculature. Gas is visible in the region of the left groin tracking into the left labia. No worrisome lytic or sclerotic osseous abnormality. Mild superior endplate compression deformity is seen at T12 and L2, similar to 04/24/2019. Review of the MIP images confirms the above findings. IMPRESSION: 1. No  CT evidence for acute pulmonary embolus. 2. Small gas collections are seen adjacent to the distal esophagus. Mild circumferential wall thickening noted distal esophagus. Imaging features are nonspecific and are most likely related to recent surgery. Extraluminal gas not unexpected 7 days after surgery. While imaging features are not highly suspicious for esophageal leak, contrast esophagram could be used to further evaluate as clinically warranted. 3. Soft tissue gas identified in the deep subcutaneous fat of the left anterior and lateral abdominal wall, superficial to the musculature. Gas is visible in the region of the left groin tracking into the left labia. Findings are  compatible with recent surgery. 4. Trace gas in the bladder lumen. This could be related to recent instrumentation or infection. 5. Moderate to large stool volume right and transverse colon. Imaging features could be compatible with constipation in the appropriate clinical setting. 6. Scattered tiny hypodensities in the liver parenchyma are too small to characterize measuring up to 10 mm in the lateral segment left liver. Some of these lesions approach water density but others have attenuation higher than expected for simple cyst. Some of these lesions are not definitely visible on the previous CT of 05/17/2022. While most likely benign but follow-up MRI of the abdomen with and without contrast recommended to further evaluate. This MRI should be deferred until resolution of the patient's acute symptoms sub patient can best participate in positioning and breath holding. This exam can certainly be performed on an outpatient basis. 7. Prior splenectomy with residual splenules evident. 8.  Aortic Atherosclerosis (ICD10-I70.0). Electronically Signed   By: Misty Stanley M.D.   On: 07/12/2022 11:57   CT ABDOMEN PELVIS W CONTRAST  Result Date: 07/12/2022 CLINICAL DATA:  Coughing gurgling sensation in the chest. Status post laparoscopic repair right posterior diaphragmatic hernia and paraesophageal hernia on 07/05/2022. Abdominal discomfort. EXAM: CT ANGIOGRAPHY CHEST CT ABDOMEN AND PELVIS WITH CONTRAST TECHNIQUE: Multidetector CT imaging of the chest was performed using the standard protocol during bolus administration of intravenous contrast. Multiplanar CT image reconstructions and MIPs were obtained to evaluate the vascular anatomy. Multidetector CT imaging of the abdomen and pelvis was performed using the standard protocol during bolus administration of intravenous contrast. RADIATION DOSE REDUCTION: This exam was performed according to the departmental dose-optimization program which includes automated exposure  control, adjustment of the mA and/or kV according to patient size and/or use of iterative reconstruction technique. CONTRAST:  111mL OMNIPAQUE IOHEXOL 350 MG/ML SOLN COMPARISON:  Chest CT 05/17/2022. CTA chest abdomen pelvis 04/24/2019 FINDINGS: CTA CHEST FINDINGS Cardiovascular: Heart is enlarged. No substantial pericardial effusion. Mild atherosclerotic calcification is noted in the wall of the thoracic aorta. There is no filling defect within the opacified pulmonary arteries to suggest the presence of an acute pulmonary embolus. Mediastinum/Nodes: No mediastinal lymphadenopathy. There is no hilar lymphadenopathy. The esophagus has normal imaging features. Small gas collections are seen adjacent to the distal esophagus (images 334/6 in 363/6). Mild circumferential wall thickening noted distal esophagus Lungs/Pleura: No suspicious pulmonary nodule or mass. No overt pulmonary edema. Compressive atelectasis noted in the dependent lung bases, right greater than left. No pleural effusion Musculoskeletal: No worrisome lytic or sclerotic osseous abnormality. Status post right shoulder replacement. Old left rib fractures evident. Review of the MIP images confirms the above findings. CT ABDOMEN and PELVIS FINDINGS Hepatobiliary: Scattered tiny hypodensities in the liver parenchyma are too small to characterize measuring up to 10 mm in the lateral segment left liver (image 20/series 2) some of these lesions approach water density but  others have attenuation higher than expected for simple cyst. Some of these lesions are not definitely visible on the previous CT of 05/17/2022. Nonvisualization of the gallbladder suggest prior cholecystectomy. Mild common bile duct dilatation at 9 mm diameter, stable since 05/17/2022. Pancreas: No focal mass lesion. No dilatation of the main duct. No intraparenchymal cyst. No peripancreatic edema. Spleen: Prior splenectomy with residual splenules evident. Adrenals/Urinary Tract: No adrenal  nodule or mass. Right kidney unremarkable. Tiny hypodensity lower pole left kidney is stable, likely a cyst. No followup imaging is recommended. No evidence for hydroureter. Trace gas identified in the bladder lumen (71/2). Stomach/Bowel: Distortion of the gastric cardia likely secondary to recent paraesophageal hernia repair. Tiny gas bubble on image 19/2 may be intraluminal but if not, would not be unexpected 7 days after surgery. Distal stomach unremarkable. Duodenum is normally positioned as is the ligament of Treitz. No small bowel wall thickening. No small bowel dilatation. Mobile cecum is identified in the anterior upper abdomen with ileocecal bowel visible on image 41/2. Cecum was anterior to the liver on the 04/24/2019 exam. The appendix is not well visualized, but there is no edema or inflammation in the region of the cecum. Moderate to large stool volume noted right and transverse colon. Vascular/Lymphatic: There is mild atherosclerotic calcification of the abdominal aorta without aneurysm. There is no gastrohepatic or hepatoduodenal ligament lymphadenopathy. No retroperitoneal or mesenteric lymphadenopathy. No pelvic sidewall lymphadenopathy. Reproductive: Hysterectomy.  There is no adnexal mass. Other: Trace free fluid is seen in the pelvis. Musculoskeletal: Soft tissue gas is identified in the deep subcutaneous fat of the left anterior and lateral abdominal wall, superficial to the musculature. Gas is visible in the region of the left groin tracking into the left labia. No worrisome lytic or sclerotic osseous abnormality. Mild superior endplate compression deformity is seen at T12 and L2, similar to 04/24/2019. Review of the MIP images confirms the above findings. IMPRESSION: 1. No CT evidence for acute pulmonary embolus. 2. Small gas collections are seen adjacent to the distal esophagus. Mild circumferential wall thickening noted distal esophagus. Imaging features are nonspecific and are most likely  related to recent surgery. Extraluminal gas not unexpected 7 days after surgery. While imaging features are not highly suspicious for esophageal leak, contrast esophagram could be used to further evaluate as clinically warranted. 3. Soft tissue gas identified in the deep subcutaneous fat of the left anterior and lateral abdominal wall, superficial to the musculature. Gas is visible in the region of the left groin tracking into the left labia. Findings are compatible with recent surgery. 4. Trace gas in the bladder lumen. This could be related to recent instrumentation or infection. 5. Moderate to large stool volume right and transverse colon. Imaging features could be compatible with constipation in the appropriate clinical setting. 6. Scattered tiny hypodensities in the liver parenchyma are too small to characterize measuring up to 10 mm in the lateral segment left liver. Some of these lesions approach water density but others have attenuation higher than expected for simple cyst. Some of these lesions are not definitely visible on the previous CT of 05/17/2022. While most likely benign but follow-up MRI of the abdomen with and without contrast recommended to further evaluate. This MRI should be deferred until resolution of the patient's acute symptoms sub patient can best participate in positioning and breath holding. This exam can certainly be performed on an outpatient basis. 7. Prior splenectomy with residual splenules evident. 8.  Aortic Atherosclerosis (ICD10-I70.0). Electronically Signed  By: Misty Stanley M.D.   On: 07/12/2022 11:57   DG Chest 2 View  Result Date: 07/12/2022 CLINICAL DATA:  Chest pain and shortness of breath. EXAM: CHEST - 2 VIEW COMPARISON:  Chest two views 06/21/2020, CTA chest 05/17/2022 FINDINGS: Cardiac silhouette and mediastinal contours are unchanged and within normal limits. There is again flattening of the diaphragms and moderate hyperinflation. New minimal right costophrenic  angle blunting. Unchanged linear likely scarring within the left costophrenic angle on frontal view. No pneumothorax is seen. Mild multilevel degenerative disc changes. Surgical clips again overlie the left upper abdominal quadrant, related to prior splenectomy. IMPRESSION: 1. New small right pleural effusion. 2. Unchanged hyperinflation consistent with COPD. 3. Unchanged left basilar scarring. Electronically Signed   By: Yvonne Kendall M.D.   On: 07/12/2022 11:02      CT imaging reviewed by me.  Discussed with Dr. Barb Merino team Susanne Greenhouse)   PROCEDURES:  Critical Care performed: No  Procedures   MEDICATIONS ORDERED IN ED: Medications  ondansetron (ZOFRAN) injection 4 mg (0 mg Intravenous Hold 07/12/22 1052)  iohexol (OMNIPAQUE) 350 MG/ML injection 100 mL (100 mLs Intravenous Contrast Given 07/12/22 1129)     IMPRESSION / MDM / ASSESSMENT AND PLAN / ED COURSE  I reviewed the triage vital signs and the nursing notes.                              Differential diagnosis includes, but is not limited to, possible surgical complication, bleeding, abscess, hemorrhage, dehiscence of internal surgical site etc.  Patient does not have any symptoms that sound highly suggestive of ACS her initial troponin is normal and her EKG without evidence of new or acute ischemia.  Also need to exclude other causes such as pulmonary embolism, empyema, infection etc.  She does not have any signs or symptoms that would be highly suggestive of upper respiratory infection she is afebrile.  Will proceed with CT to exclude pulmonary embolism and also include abdomen pelvis to evaluate for surgical site abnormalities as well.  Dr. Dahlia Byes and Shon Millet of surgery aware of presentation and await CT results  Patient's presentation is most consistent with acute complicated illness / injury requiring diagnostic workup.   ----------------------------------------- 2:18 PM on  07/12/2022 -----------------------------------------  Patient's workup reassuring.  Normal comprehensive metabolic panel minimally elevated white count which does not show clear evidence clinically of infection on her workup today but may represent routine healing as well.  Thankfully no evidence of ACS, she has had symptoms for over a day her troponin is normal, EKG without ischemic abnormality, and her CT imaging and surgical consultation have been reassuring.  She does have a slight cough and offered to perform viral testing including COVID and influenza but patient denies and does not wish to have testing for either illness at this point.  Discussed return precautions and with shared medical decision making we will forego viral testing.  She is ambulatory feeling improved and reports that she is comfortable and would like to be discharged as she feels well and reassured that we have been able to rule out problems with her surgical site.  Has follow-up set up for Wednesday with surgery.  Return precautions and treatment recommendations and follow-up discussed with the patient who is agreeable with the plan.  Discussed her potential liver cyst that need follow-up MRI, patient is understanding of this and will notify her PCP Dr. Caryn Section  FINAL CLINICAL IMPRESSION(S) / ED DIAGNOSES   Final diagnoses:  Chest pain, atypical  Liver lesion     Rx / DC Orders   ED Discharge Orders     None        Note:  This document was prepared using Dragon voice recognition software and may include unintentional dictation errors.   Sharyn Creamer, MD 07/12/22 1419

## 2022-07-12 NOTE — Discharge Instructions (Addendum)
Please return to the emergency room right away if you develop any severe chest pain, trouble breathing, high fevers, abdominal pain vomiting or other concerns arise.  Please follow-up Wednesday with your surgeon as scheduled.  Also, please notify Dr. Caryn Section that we recommend that you have some further evaluation including an MRI of your liver in a couple months time.

## 2022-07-12 NOTE — Consult Note (Signed)
[facility] SURGICAL CONSULTATION NOTE (initial) - cpt: [phone]   HISTORY OF PRESENT ILLNESS (HPI):  72 y.o. female presented to [facility] ED today for evaluation of "gurgling sensation" in her chest. Patient known to our service following recent robotic assisted laparoscopic repair of right posterior diaphragmatic hernia, paraesophageal hernia, and 270 degree fundoplication on 01/09 with Dr Everlene Farrier. She did well post-op and had UGI to confirm no esophageal injury nor leak. Discharged home no 01/11. She reports that she had done well until this morning. When she woke up, she felt a "gurgling sensation" in her chest especially with deep breathing. Still also with mild, improving, upper chest/shoulder pain. She reports a history of PNA in the past and this was her greatest concern. She denied any fever, chills, SOB, abdominal pain, nausea, emesis. No trouble swallowing. She is following her Nissen diet recommendations. No other new complaints. Work up in the ED revealed a leukocytosis to 11.2K (was 12.9K at discharge), BMP reassuring, normal lactic acid level at 1.0. She did have a CTA Chest/Abdomen/Pelvis which showed expected post-operative findings, no abscess, no evidence of esophageal injury.   Surgery is consulted by emergency medicine physician Dr. Sharyn Creamer, MD in this context.  PAST MEDICAL HISTORY (PMH):  Past Medical History:  Diagnosis Date   Anxiety    Arthritis    Asthma    GERD (gastroesophageal reflux disease)    Headache    Migraines   Heart murmur    Hypertension    Idiopathic thrombocytopenia purpura (HCC)    Required splenectomy   MVP (mitral valve prolapse)    Neuromuscular disorder (HCC)    Restless Leg Syndrome   Pancreatitis April 2017 [facility] due to cholecystitis.    Pneumonia    06-2015     PAST SURGICAL HISTORY (PSH):  Past Surgical History:  Procedure Laterality Date   ABDOMINAL HYSTERECTOMY     APPENDECTOMY     CARPAL TUNNEL RELEASE Bilateral  11/19/2014   Procedure: CARPAL TUNNEL RELEASE;  Surgeon: Donato Heinz, MD;  Location: [facility] ORS;  Service: Orthopedics;  Laterality: Bilateral;   CESAREAN SECTION     times 2   CHOLECYSTECTOMY  09/29/2015   [facility]   COLONOSCOPY N/A 05/02/2016   Procedure: COLONOSCOPY;  Surgeon: Scot Jun, MD;  Location: [facility] ENDOSCOPY;  Service: Endoscopy;  Laterality: N/A;   ESOPHAGOGASTRODUODENOSCOPY (EGD) WITH PROPOFOL N/A 05/02/2016   Procedure: ESOPHAGOGASTRODUODENOSCOPY (EGD) WITH PROPOFOL;  Surgeon: Scot Jun, MD;  Location: [facility] ENDOSCOPY;  Service: Endoscopy;  Laterality: N/A;   ESOPHAGOGASTRODUODENOSCOPY (EGD) WITH PROPOFOL N/A 11/22/2017   Procedure: ESOPHAGOGASTRODUODENOSCOPY (EGD) WITH PROPOFOL;  Surgeon: Scot Jun, MD;  Location: [facility] ENDOSCOPY;  Service: Endoscopy;  Laterality: N/A;   HERNIA REPAIR     times 3   INSERTION OF MESH  07/05/2022   Procedure: INSERTION OF MESH;  Surgeon: Leafy Ro, MD;  Location: [facility] ORS;  Service: General;;   JOINT REPLACEMENT     OVARIAN CYST SURGERY     SHOULDER ARTHROSCOPY WITH ROTATOR CUFF REPAIR     times 2   SPLENECTOMY, TOTAL     TOTAL SHOULDER ARTHROPLASTY Right 04/21/2017   Procedure: REVERSE SHOULDER ARTHROPLASTY;  Surgeon: Signa Kell, MD;  Location: [facility] ORS;  Service: Orthopedics;  Laterality: Right;   TRIGGER FINGER RELEASE Right 11/19/2014   Procedure: RELEASE TRIGGER FINGER/A-1 PULLEY;  Surgeon: Donato Heinz, MD;  Location: [facility] ORS;  Service: Orthopedics;  Laterality: Right;   TRIGGER FINGER RELEASE Right 08/12/2015  Procedure: RELEASE TRIGGER LONG FINGER;  Surgeon: Donato Heinz, MD;  Location: [facility] ORS;  Service: Orthopedics;  Laterality: Right;   XI ROBOTIC ASSISTED PARAESOPHAGEAL HERNIA REPAIR  07/05/2022   Procedure: XI ROBOTIC ASSISTED PARAESOPHAGEAL HERNIA REPAIR;  Surgeon: Leafy Ro, MD;  Location: [facility] ORS;  Service: General;;     MEDICATIONS:  Prior to Admission medications   Medication Sig Start Date  End Date Taking? Authorizing Provider  albuterol (VENTOLIN HFA) 108 (90 Base) MCG/ACT inhaler Inhale 2 puffs into the lungs every 6 (six) hours as needed for wheezing. May substitute generic or equivalent brand name 07/07/15   Malva Limes, MD  alendronate (FOSAMAX) 70 MG tablet TAKE 1 TABLET BY MOUTH ONCE WEEKLY ON AN EMPTY STOMACH BEFORE BREAKFAST. REMAIN UPRIGHT FOR 30 MINUTES AND TAKE WITH 8 OUNCES OF WATER 06/28/22   Malva Limes, MD  amLODipine (NORVASC) 10 MG tablet Take 1 tablet (10 mg total) by mouth daily. 10/08/21   Malva Limes, MD  atenolol (TENORMIN) 25 MG tablet Take 1 tablet (25 mg total) by mouth daily. 09/22/21   Malva Limes, MD  azelastine (ASTELIN) 0.1 % nasal spray Place 2 sprays into both nostrils as needed. 12/24/13   [provider]  EPINEPHrine 0.3 mg/0.3 mL IJ SOAJ injection as needed. 12/25/18   [provider]  Fexofenadine HCl (ALLEGRA PO) Take 1 capsule by mouth daily.    [provider]  fluticasone (FLONASE) 50 MCG/ACT nasal spray Place 2 sprays into both nostrils as needed for allergies. 12/05/14   [provider]  fluticasone (FLOVENT HFA) 44 MCG/ACT inhaler Inhale 1 puff into the lungs daily.    [provider]  ibuprofen (ADVIL) 600 MG tablet Take 1 tablet (600 mg total) by mouth every 6 (six) hours as needed. 07/07/22   Donovan Kail, PA-C  methadone (DOLOPHINE) 5 MG tablet Take 5 mg by mouth daily. 03/24/21   Idelle Crouch, MD  montelukast (SINGULAIR) 10 MG tablet Take 10 mg by mouth at bedtime.  12/24/13   [provider]  omeprazole (PRILOSEC) 40 MG capsule Take 40 mg by mouth daily. 11/22/17   [provider]  ondansetron (ZOFRAN ODT) 4 MG disintegrating tablet Take 1 tablet (4 mg total) by mouth every 8 (eight) hours as needed for nausea or vomiting. 07/07/22   Donovan Kail, PA-C  oxyCODONE (OXY IR/ROXICODONE) 5 MG immediate release tablet Take 1 tablet (5 mg total) by mouth every 6  (six) hours as needed for severe pain or breakthrough pain. 07/07/22   Donovan Kail, PA-C  pramipexole (MIRAPEX) 0.5 MG tablet Take 1 tablet by mouth in the morning and at bedtime. 4 pm and 9pm daily 02/22/20   [provider]  SUMAtriptan (IMITREX) 100 MG tablet TAKE 1 TABLET BY MOUTH AT ONSET OF HEADACHE. MAY TAKE AN ADDITIONAL TABLET IN 2 HOURS IF NEEDED. MAX OF 200mg 06/30/21   08/28/21, MD  Vitamin D, Ergocalciferol, (DRISDOL) 1.25 MG (50000 UNIT) CAPS capsule Take 1 capsule (50,000 Units total) by mouth once a week. 06/25/21   06/27/21, MD     ALLERGIES:  Allergies  Allergen Reactions   Augmentin [Amoxicillin-Pot Clavulanate] Nausea And Vomiting   Cat Hair Extract     Other reaction(s): Not available   Dog Epithelium Allergy Skin Test     Other reaction(s): Not available   Droperidol Other (See Comments)    "Locked jaw" Facial muscles locked "Locked jaw"  Molds & Smuts     Other reaction(s): Not available   Paroxetine Hcl Other (See Comments)    syncope Passed out    Tree Extract     Other reaction(s): Not available   Morphine Itching     SOCIAL HISTORY:  Social History   Socioeconomic History   Marital status: Married    Spouse name: Sherrine Maples   Number of children: 2   Years of education: Not on file   Highest education level: Bachelor's degree (e.g., BA, AB, BS)  Occupational History   Occupation: Retired  Tobacco Use   Smoking status: Former    Packs/day: 1.00    Years: 10.00    Total pack years: 10.00    Types: Cigarettes    Quit date: 11/10/1979    Years since quitting: 42.6   Smokeless tobacco: Never  Vaping Use   Vaping Use: Never used  Substance and Sexual Activity   Alcohol use: Not Currently    Alcohol/week: 0.0 standard drinks of alcohol   Drug use: No   Sexual activity: Never  Other Topics Concern   Not on file  Social History Narrative   Not on file   Social Determinants of Health   Financial Resource Strain:  Low Risk  (06/01/2022)   Overall Financial Resource Strain (CARDIA)    Difficulty of Paying Living Expenses: Not hard at all  Food Insecurity: No Food Insecurity (07/05/2022)   Hunger Vital Sign    Worried About Running Out of Food in the Last Year: Never true    Ran Out of Food in the Last Year: Never true  Transportation Needs: No Transportation Needs (07/05/2022)   PRAPARE - Administrator, Civil Service (Medical): No    Lack of Transportation (Non-Medical): No  Physical Activity: Sufficiently Active (06/01/2022)   Exercise Vital Sign    Days of Exercise per Week: 5 days    Minutes of Exercise per Session: 30 min  Stress: No Stress Concern Present (06/01/2022)   [employer] of Occupational Health - Occupational Stress Questionnaire    Feeling of Stress : Only a little  Social Connections: Moderately Integrated (06/01/2022)   Social Connection and Isolation Panel [NHANES]    Frequency of Communication with Friends and Family: More than three times a week    Frequency of Social Gatherings with Friends and Family: Twice a week    Attends Religious Services: More than 4 times per year    Active Member of [employer] or Organizations: No    Attends Banker Meetings: Never    Marital Status: Married  Catering manager Violence: Not At Risk (07/05/2022)   Humiliation, Afraid, Rape, and Kick questionnaire    Fear of Current or Ex-Partner: No    Emotionally Abused: No    Physically Abused: No    Sexually Abused: No     FAMILY HISTORY:  Family History  Problem Relation Age of Onset   Hypertension Mother    Renal Disease Father        ESRD with HD      REVIEW OF SYSTEMS:  Review of Systems  Constitutional:  Negative for chills and fever.  Respiratory:  Positive for cough and shortness of breath. Negative for sputum production and wheezing.   Cardiovascular:  Positive for chest pain. Negative for palpitations and leg swelling.  Gastrointestinal:  Negative for  abdominal pain, nausea and vomiting.  Genitourinary:  Negative for dysuria and urgency.  All other systems reviewed and are negative.  VITAL SIGNS:  Temp:  [97.9 F (36.6 C)] 97.9 F (36.6 C) (01/16 1156) Pulse Rate:  [62-71] 62 (01/16 1156) Resp:  [17-18] 17 (01/16 1156) BP: (148)/(65-73) 148/73 (01/16 1156) SpO2:  [97 %-98 %] 98 % (01/16 1156) Weight:  [56.7 kg] 56.7 kg (01/16 1000)     Height: 5\' 4"  (162.6 cm) Weight: 56.7 kg BMI (Calculated): 21.45   INTAKE/OUTPUT:  No intake/output data recorded.  PHYSICAL EXAM:  Physical Exam Vitals and nursing note reviewed. Exam conducted with a chaperone present.  Constitutional:      General: She is not in acute distress.    Appearance: Normal appearance. She is normal weight. She is not ill-appearing.     Comments: Patient resting comfortably, NAD, husband at bedside   HENT:     Head: Normocephalic and atraumatic.  Eyes:     General: No scleral icterus.    Conjunctiva/sclera: Conjunctivae normal.  Cardiovascular:     Rate and Rhythm: Normal rate.     Pulses: Normal pulses.     Heart sounds: No murmur heard. Pulmonary:     Effort: Pulmonary effort is normal. No respiratory distress.     Breath sounds: Normal breath sounds. No wheezing.  Abdominal:     General: Abdomen is flat. A surgical scar is present. There is no distension.     Palpations: Abdomen is soft.     Tenderness: There is no abdominal tenderness. There is no guarding or rebound.     Comments: Abdomen is soft, mild incisional soreness, non-distended, no rebound/guarding. Laparoscopic incisions are CDI with dermabond, there is significant ecchymosis, no erythema, no drainage   Genitourinary:    Comments: Deferred Musculoskeletal:     Right lower leg: No edema.     Left lower leg: No edema.  Skin:    General: Skin is warm and dry.     Findings: Ecchymosis present. No erythema.  Neurological:     General: No focal deficit present.     Mental Status: She is alert  and oriented to person, place, and time.  Psychiatric:        Mood and Affect: Mood normal.        Behavior: Behavior normal.      Labs:     Latest Ref Rng & Units 07/12/2022   10:09 AM 07/07/2022    5:17 AM 07/06/2022    6:24 AM  CBC  WBC 4.0 - 10.5 K/uL 11.2  12.9  11.6   Hemoglobin 12.0 - 15.0 g/dL 12.8  13.0  12.1   Hematocrit 36.0 - 46.0 % 36.9  37.7  35.3   Platelets 150 - 400 K/uL 176  123  118       Latest Ref Rng & Units 07/12/2022   10:09 AM 07/06/2022    6:24 AM 07/05/2022    3:03 PM  CMP  Glucose 70 - 99 mg/dL 107  108    BUN 8 - 23 mg/dL 19  20    Creatinine 0.44 - 1.00 mg/dL 0.56  0.67  0.78   Sodium 135 - 145 mmol/L 138  139    Potassium 3.5 - 5.1 mmol/L 3.6  4.0    Chloride 98 - 111 mmol/L 106  109    CO2 22 - 32 mmol/L 23  23    Calcium 8.9 - 10.3 mg/dL 8.9  8.2    Total Protein 6.5 - 8.1 g/dL 7.5     Total Bilirubin 0.3 - 1.2 mg/dL 0.7  Alkaline Phos 38 - 126 U/L 54     AST 15 - 41 U/L 25     ALT 0 - 44 U/L 35        Imaging studies:   CTA Chest/Abdomen/Pelvis (07/12/2021) personally reviewed with expected post-operative findings, no evidence of esophageal injury, no evidence of recurrence, no abscess nor free air, there is subcutaneous air in pattern of her incision sites, and radiologist report reviewed below:  IMPRESSION: 1. No CT evidence for acute pulmonary embolus. 2. Small gas collections are seen adjacent to the distal esophagus. Mild circumferential wall thickening noted distal esophagus. Imaging features are nonspecific and are most likely related to recent surgery. Extraluminal gas not unexpected 7 days after surgery. While imaging features are not highly suspicious for esophageal leak, contrast esophagram could be used to further evaluate as clinically warranted. 3. Soft tissue gas identified in the deep subcutaneous fat of the left anterior and lateral abdominal wall, superficial to the musculature. Gas is visible in the region of the  left groin tracking into the left labia. Findings are compatible with recent surgery. 4. Trace gas in the bladder lumen. This could be related to recent instrumentation or infection. 5. Moderate to large stool volume right and transverse colon. Imaging features could be compatible with constipation in the appropriate clinical setting. 6. Scattered tiny hypodensities in the liver parenchyma are too small to characterize measuring up to 10 mm in the lateral segment left liver. Some of these lesions approach water density but others have attenuation higher than expected for simple cyst. Some of these lesions are not definitely visible on the previous CT of 05/17/2022. While most likely benign but follow-up MRI of the abdomen with and without contrast recommended to further evaluate. This MRI should be deferred until resolution of the patient's acute symptoms sub patient can best participate in positioning and breath holding. This exam can certainly be performed on an outpatient basis. 7. Prior splenectomy with residual splenules evident. 8.  Aortic Atherosclerosis (ICD10-I70.0).     Assessment/Plan: 72 y.o. female s/p robotic assisted laparoscopic repair of right posterior diaphragmatic hernia, paraesophageal hernia, and 646 degree fundoplication on 80/32 with Dr Dahlia Byes   - No evidence of complication in regards to her paraesophageal and diaphragmatic hernia repairs. No clinical or radiographic evidence of esophageal injury or leak. I do not think she warrants any additional imaging at this time.  - Subcutaneous air is consistent with her incision sites; these are ecchymotic but not erythematous. She is otherwise hemodynamically stable, without fever, and hemodynamically stable.  - Continue Nissen diet recommendations as previously discussed - Reviewed pain management - She is do for follow up on 01/24 with Dr Dahlia Byes   - Nothing further to add   All of the above findings and  recommendations were discussed with the patient and her family, and all of their questions were answered to their expressed satisfaction.  Thank you for the opportunity to participate in this patient's care.   -- Edison Simon, PA-C [facility] 07/12/2022, 1:05 PM M-F: 7am - 4pm

## 2022-07-12 NOTE — ED Triage Notes (Signed)
Pt presents to the ED via POV due to CP and SOB that started last night. Pt was recently discharged after a hernia repair and esophageus tear repair. Pt is no taking any blood thinners. Pt states she feels like she is "catching pneumonia". Pt A&Ox4.

## 2022-07-12 NOTE — ED Provider Triage Note (Signed)
Emergency Medicine Provider Triage Evaluation Note  Heidi Hernandez , a 72 y.o. female  was evaluated in triage.  Pt complains of somewhat hard to describe discomfort along with a mild cough and a "gurgling" sensation in her chest.  Reports she had recent diaphragmatic hernia surgery  She is not having any abdominal pain.  She is experiencing some mild nausea.  No fevers or chills.  Denies that she is having any sort of severe chest pain but rather feels like a strain sensation in her chest  Review of Systems  Positive: Chest discomfort, cough, recent surgery Negative: Fevers.  Abdominal pain  Physical Exam  BP (!) 148/65 (BP Location: Left Arm)   Pulse 71   Temp 97.9 F (36.6 C) (Oral)   Resp 18   Ht 5\' 4"  (1.626 m)   Wt 56.7 kg   SpO2 97%   BMI 21.46 kg/m  Gen:   Awake, no distress   Resp:  Normal effort clear lungs normal work of breathing MSK:   Moves extremities without difficulty  Other:  Normal heart tones and rate  Medical Decision Making  Medically screening exam initiated at 10:06 AM.  Appropriate orders placed.  Discussed with patient plan for CT imaging she is in agreement.    Delman Kitten, MD 07/12/22 1008

## 2022-07-12 NOTE — Telephone Encounter (Signed)
  Chief Complaint: mid chest pain, shoulder pain. Neck, Symptoms: moderate to severe sharp pain, SOB, nausea, mild dizziness, cough with small amount clear phlegm,  Frequency: last night Pertinent Negatives: Patient denies vomiting, sweating, fever Disposition: [] ED /[] Urgent Care (no appt availability in office) / [] Appointment(In office/virtual)/ []  [facility]/ [] Home Care/ [x] Refused Recommended Disposition /[] [facility]/ []  Follow-up with PCP Additional Notes:Hurting in mid chest left  S/p repair of paraesophageal hernia on 07/05/21 Rattling sound worried pna- called office no clinical available Refused ED- calling on call doc [phone]  Spoke with answering service regarding sx and refusal to ED Spoke with Sherlie Ban RN -7-5 no one on call- gave assessment information- advised pt refused ED- Leonard Downing will cal pt.   Reason for Disposition  Dizziness or lightheadedness  Answer Assessment - Initial Assessment Questions 1. LOCATION: "Where does it hurt?"       Mid chest  2. RADIATION: "Does the pain go anywhere else?" (e.g., into neck, jaw, arms, back)     Mid chest pain left - neck shoulder  3. ONSET: "When did the chest pain begin?" (Minutes, hours or days)      Last night 4. PATTERN: "Does the pain come and go, or has it been constant since it started?"  "Does it get worse with exertion?"      constant 5. DURATION: "How long does it last" (e.g., seconds, minutes, hours)     constant 6. SEVERITY: "How bad is the pain?"  (e.g., Scale 1-10; mild, moderate, or severe)    - MILD (1-3): doesn't interfere with normal activities     - MODERATE (4-7): interferes with normal activities or awakens from sleep    - SEVERE (8-10): excruciating pain, unable to do any normal activities       Sharp- moderate  7. CARDIAC RISK FACTORS: "Do you have any history of heart problems or risk factors for heart disease?" (e.g., angina, prior heart attack; diabetes, high blood  pressure, high cholesterol, smoker, or strong family history of heart disease)     HTN, watching "swollen aorta", heart murmur, mitral valve prolaspe 8. PULMONARY RISK FACTORS: "Do you have any history of lung disease?"  (e.g., blood clots in lung, asthma, emphysema, birth control pills)     no 9. CAUSE: "What do you think is causing the chest pain?"     Unsure if pna or gas pain 10. OTHER SYMPTOMS: "Do you have any other symptoms?" (e.g., dizziness, nausea, vomiting, sweating, fever, difficulty breathing, cough)       Cough, hurts to take breath in. Sm amount of phlegm - hear the congestion, dizziness and nausea 11. PREGNANCY: "Is there any chance you are pregnant?" "When was your last menstrual period?"       N/a  Protocols used: Chest Pain-A-AH

## 2022-07-20 ENCOUNTER — Ambulatory Visit (INDEPENDENT_AMBULATORY_CARE_PROVIDER_SITE_OTHER): Payer: Medicare HMO | Admitting: Surgery

## 2022-07-20 ENCOUNTER — Encounter: Payer: Self-pay | Admitting: Surgery

## 2022-07-20 ENCOUNTER — Other Ambulatory Visit: Payer: Self-pay

## 2022-07-20 VITALS — BP 171/83 | HR 65 | Temp 98.2°F | Ht 64.0 in | Wt 120.0 lb

## 2022-07-20 DIAGNOSIS — Z09 Encounter for follow-up examination after completed treatment for conditions other than malignant neoplasm: Secondary | ICD-10-CM

## 2022-07-20 DIAGNOSIS — K449 Diaphragmatic hernia without obstruction or gangrene: Secondary | ICD-10-CM

## 2022-07-20 NOTE — Patient Instructions (Addendum)
Please see your follow up appointment listed below.    Take Miralax twice daily. Drink plenty of fluids. You may eat well cooked -Carrots, Broccoli, ground beef, shredded chicken and pork.   Wait one month on eating bread.

## 2022-07-21 ENCOUNTER — Encounter: Payer: Self-pay | Admitting: Surgery

## 2022-07-21 NOTE — Progress Notes (Signed)
Heidi Hernandez and half weeks out from diaphragmatic hernia repair.  She is doing okay.  She has no reflux.  No fevers no chills she is doing a Nissen diet.  PE NAD Chest CTA ABd: Patient is healing well without infection.  No peritonitis  A/P Doing well RTC 2-3 months Start advancing diet w/o carbonation or bread

## 2022-08-01 ENCOUNTER — Telehealth: Payer: Self-pay | Admitting: *Deleted

## 2022-08-01 NOTE — Telephone Encounter (Signed)
Patient called and she had surgery with Dr Dahlia Byes on 07/05/22 paraesophageal hernia repair and she stated that since the surgery she just does not feel right. Off and on she gets nauseous, the smell of food makes her nauseous, her stomach hurts. She is still mostly doing a liquid diet because she doesn't want to eat but she has ate some mashed potatoes. She just wants to know if this is normal to still feel like this after a month since surgery. Please call and advise

## 2022-08-02 NOTE — Telephone Encounter (Signed)
Patient states that she is feeling better now and would like to work this on her own. She thinks that this may get better as she eats more variety of foods. She is aware to call back if symptoms worsen or persist and we would be happy to see her.

## 2022-08-08 ENCOUNTER — Ambulatory Visit: Payer: Self-pay | Admitting: *Deleted

## 2022-08-08 ENCOUNTER — Telehealth (INDEPENDENT_AMBULATORY_CARE_PROVIDER_SITE_OTHER): Payer: Medicare HMO | Admitting: Family Medicine

## 2022-08-08 DIAGNOSIS — U071 COVID-19: Secondary | ICD-10-CM | POA: Diagnosis not present

## 2022-08-08 DIAGNOSIS — E559 Vitamin D deficiency, unspecified: Secondary | ICD-10-CM | POA: Diagnosis not present

## 2022-08-08 DIAGNOSIS — R197 Diarrhea, unspecified: Secondary | ICD-10-CM

## 2022-08-08 MED ORDER — VITAMIN D (ERGOCALCIFEROL) 1.25 MG (50000 UNIT) PO CAPS: 50000.0000 [IU] | ORAL_CAPSULE | ORAL | 4 refills | Status: DC

## 2022-08-08 MED ORDER — NIRMATRELVIR/RITONAVIR (PAXLOVID)TABLET: 3.0000 | ORAL_TABLET | Freq: Two times a day (BID) | ORAL | 0 refills | Status: AC

## 2022-08-08 NOTE — Progress Notes (Signed)
[place],acting as a scribe for Lelon Huh, MD.,have documented all relevant documentation on the behalf of Lelon Huh, MD,as directed by  Lelon Huh, MD while in the presence of Lelon Huh, MD.   MyChart Video Visit    Virtual Visit via Video Note   This format is felt to be most appropriate for this patient at this time. Physical exam was limited by quality of the video and audio technology used for the visit.   Patient location: home Provider location: office  I discussed the limitations of evaluation and management by telemedicine and the availability of in person appointments. The patient expressed understanding and agreed to proceed.  Patient: Heidi Hernandez   DOB: 08-09-1950   [name]  MRN: [id number] Visit Date: 08/08/2022  Today's healthcare provider: Lelon Huh, MD   No chief complaint on file.  Subjective    HPI  Patient is a 72 year old female who presents via video visit for evaluation of positive Covid-19.  Patient began having symptoms of uncontrollable chills and fever of 102 on Saturday 08/06/22.  She tested positive for Covid on 08/07/22. Hasn't    Medications: Outpatient Medications Prior to Visit  Medication Sig   albuterol (VENTOLIN HFA) 108 (90 Base) MCG/ACT inhaler Inhale 2 puffs into the lungs every 6 (six) hours as needed for wheezing. May substitute generic or equivalent brand name   alendronate (FOSAMAX) 70 MG tablet TAKE 1 TABLET BY MOUTH ONCE WEEKLY ON AN EMPTY STOMACH BEFORE BREAKFAST. REMAIN UPRIGHT FOR 30 MINUTES AND TAKE WITH 8 OUNCES OF WATER   amLODipine (NORVASC) 10 MG tablet Take 1 tablet (10 mg total) by mouth daily.   atenolol (TENORMIN) 25 MG tablet Take 1 tablet (25 mg total) by mouth daily.   azelastine (ASTELIN) 0.1 % nasal spray Place 2 sprays into both nostrils as needed.   EPINEPHrine 0.3 mg/0.3 mL IJ SOAJ injection as needed.   Fexofenadine HCl (ALLEGRA PO) Take 1 capsule by mouth daily.    fluticasone (FLONASE) 50 MCG/ACT nasal spray Place 2 sprays into both nostrils as needed for allergies.   fluticasone (FLOVENT HFA) 44 MCG/ACT inhaler Inhale 1 puff into the lungs daily.   ibuprofen (ADVIL) 600 MG tablet Take 1 tablet (600 mg total) by mouth every 6 (six) hours as needed.   methadone (DOLOPHINE) 5 MG tablet Take 5 mg by mouth daily.   montelukast (SINGULAIR) 10 MG tablet Take 10 mg by mouth at bedtime.    omeprazole (PRILOSEC) 40 MG capsule Take 40 mg by mouth daily.   ondansetron (ZOFRAN ODT) 4 MG disintegrating tablet Take 1 tablet (4 mg total) by mouth every 8 (eight) hours as needed for nausea or vomiting.   pramipexole (MIRAPEX) 0.5 MG tablet Take 1 tablet by mouth in the morning and at bedtime. 4 pm and 9pm daily   SUMAtriptan (IMITREX) 100 MG tablet TAKE 1 TABLET BY MOUTH AT ONSET OF HEADACHE. MAY TAKE AN ADDITIONAL TABLET IN 2 HOURS IF NEEDED. MAX OF 240m/24hr   Vitamin D, Ergocalciferol, (DRISDOL) 1.25 MG (50000 UNIT) CAPS capsule Take 1 capsule (50,000 Units total) by mouth once a week.   No facility-administered medications prior to visit.    Review of Systems  Constitutional:  Positive for chills, diaphoresis, fatigue and fever.  HENT:  Positive for congestion, postnasal drip, rhinorrhea, sinus pressure, sneezing, sore throat and voice change. Negative for ear discharge, ear pain, hearing loss, sinus pain, tinnitus and trouble swallowing.   Eyes:  Negative for photophobia, pain, discharge, redness, itching and visual disturbance.  Respiratory:  Positive for cough (productive-yellow), chest tightness, shortness of breath and wheezing.   Cardiovascular:  Negative for chest pain, palpitations and leg swelling.  Gastrointestinal:  Positive for diarrhea. Negative for abdominal pain.  Musculoskeletal:  Positive for myalgias.  Neurological:  Positive for headaches. Negative for dizziness.       Objective    There were no vitals taken for this visit.   Physical  Exam   Awake, alert, oriented x 3. In no apparent distress   Assessment & Plan     1. COVID-19  - nirmatrelvir/ritonavir (PAXLOVID) 20 x 150 MG & 10 x 100MG TABS; Take 3 tablets by mouth 2 (two) times daily for 5 days. (Take nirmatrelvir 150 mg two tablets twice daily for 5 days and ritonavir 100 mg one tablet twice daily for 5 days) Patient GFR is >60. Reduce methadone to 1/2 tablet daily for 10 days  Dispense: 30 tablet; Refill: 0  2. Diarrhea, unspecified type Secondary to Covid 19  3. Vitamin D deficiency refill Vitamin D, Ergocalciferol, (DRISDOL) 1.25 MG (50000 UNIT) CAPS capsule; Take 1 capsule (50,000 Units total) by mouth once a week.  Dispense: 12 capsule; Refill: 4     I discussed the assessment and treatment plan with the patient. The patient was provided an opportunity to ask questions and all were answered. The patient agreed with the plan and demonstrated an understanding of the instructions.   The patient was advised to call back or seek an in-person evaluation if the symptoms worsen or if the condition fails to improve as anticipated.  I provided 10 minutes of non-face-to-face time during this encounter.  The entirety of the information documented in the History of Present Illness, Review of Systems and Physical Exam were personally obtained by me. Portions of this information were initially documented by the CMA and reviewed by me for thoroughness and accuracy.    Lelon Huh, MD [place] [phone] (phone) [phone] (fax)  [place]

## 2022-08-08 NOTE — Telephone Encounter (Signed)
cold and flu like symptoms / rx req   Per agent: "The patient has tested positive for Covid 19 via an at home test 08/07/22  The patient shares that they are experiencing sinus discomfort, cough, stomach discomfort, headache and an elevated temperature  The patient would like to be prescribed something for their discomfort  Please contact further when possible"      Chief Complaint: Covid Positive Symptoms: Cough, greenish, headache, body aches, Temp 102.0 yesterday, Afebrile this AM. Diarrhea yesterday, none presently. Frequency: Symptoms onset Saturday night Pertinent Negatives: Patient denies SOB Disposition: []$ ED /[]$ Urgent Care (no appt availability in office) / [x]$ Appointment(In office/virtual)/ []$  [facility]/ []$ Home Care/ []$ Refused Recommended Disposition /[]$ [place] [facility]/ []$  Follow-up with PCP Additional Notes:  Pt interested in Paxlovid. Secured VV at 1120. Care advise provided, self isolation guidelines reviewed. Pt verbalizes understanding.  Reason for Disposition  [1] HIGH RISK patient (e.g., weak immune system, age > 39 years, obesity with BMI 30 or higher, pregnant, chronic lung disease or other chronic medical condition) AND [2] COVID symptoms (e.g., cough, fever)  (Exceptions: Already seen by PCP and no new or worsening symptoms.)  Answer Assessment - Initial Assessment Questions 1. COVID-19 DIAGNOSIS: "How do you know that you have COVID?" (e.g., positive lab test or self-test, diagnosed by doctor or NP/PA, symptoms after exposure).     Home test yesterday 2. COVID-19 EXPOSURE: "Was there any known exposure to COVID before the symptoms began?" CDC Definition of close contact: within 6 feet (2 meters) for a total of 15 minutes or more over a 24-hour period.      No 3. ONSET: "When did the COVID-19 symptoms start?"      Saturday night 4. WORST SYMPTOM: "What is your worst symptom?" (e.g., cough, fever, shortness of breath, muscle aches)     Cough 5.  COUGH: "Do you have a cough?" If Yes, ask: "How bad is the cough?"       Productive greenish ,headache,  sinus pain 6. FEVER: "Do you have a fever?" If Yes, ask: "What is your temperature, how was it measured, and when did it start?"     102.0 last night. 98.6 this AM 7. RESPIRATORY STATUS: "Describe your breathing?" (e.g., normal; shortness of breath, wheezing, unable to speak)      Mild yesterday with my asthma 8. BETTER-SAME-WORSE: "Are you getting better, staying the same or getting worse compared to yesterday?"  If getting worse, ask, "In what way?"     Better in some ways 9. OTHER SYMPTOMS: "Do you have any other symptoms?"  (e.g., chills, fatigue, headache, loss of smell or taste, muscle pain, sore throat)     Diarrhea yesterday, weak, body aches 10. HIGH RISK DISEASE: "Do you have any chronic medical problems?" (e.g., asthma, heart or lung disease, weak immune system, obesity, etc.)       Yes 11. VACCINE: "Have you had the COVID-19 vaccine?" If Yes, ask: "Which one, how many shots, when did you get it?"       First 2. 13. O2 SATURATION MONITOR:  "Do you use an oxygen saturation monitor (pulse oximeter) at home?" If Yes, ask "What is your reading (oxygen level) today?" "What is your usual oxygen saturation reading?" (e.g., 95%)       NA  Protocols used: Coronavirus (COVID-19) Diagnosed or Suspected-A-AH

## 2022-08-17 ENCOUNTER — Encounter: Payer: Self-pay | Admitting: Surgery

## 2022-08-17 ENCOUNTER — Ambulatory Visit (INDEPENDENT_AMBULATORY_CARE_PROVIDER_SITE_OTHER): Payer: Medicare HMO | Admitting: Surgery

## 2022-08-17 VITALS — BP 137/73 | HR 63 | Temp 98.0°F | Ht 64.0 in | Wt 114.0 lb

## 2022-08-17 DIAGNOSIS — Z09 Encounter for follow-up examination after completed treatment for conditions other than malignant neoplasm: Secondary | ICD-10-CM

## 2022-08-17 DIAGNOSIS — K449 Diaphragmatic hernia without obstruction or gangrene: Secondary | ICD-10-CM

## 2022-08-17 MED ORDER — ONDANSETRON 4 MG PO TBDP: 4.0000 mg | ORAL_TABLET | Freq: Three times a day (TID) | ORAL | 2 refills | Status: DC | PRN

## 2022-08-17 NOTE — Patient Instructions (Addendum)
Follow up as scheduled.   Take your Zofran 15-20 minutes before your 2 biggest meals. It is ok to take this every day, even twice a day in order for you to keep up with your intake.   We will send you in a refill for this medication. Do not be afraid to take it.   Eat what ever you feel like eating. Concentrate on proteins. It may take you a few months to get over the nausea.   Protein Content in Foods Protein is a necessary nutrient in any diet. It helps build and repair muscles, bones, and skin. Depending on your overall health, you may need more or less protein in your diet. You are encouraged to eat a variety of protein foods to ensure that you get all the essential nutrients that are found in different protein foods. Talk with your health care provider or dietitian about how much protein you need each day and which sources of protein are best for you. Protein is especially important for: Repairing and making cells and tissues. Fighting infection. Providing energy. Growth and development. See the following list for the protein content of some common foods. What are tips for getting more protein in your diet? Try to replace processed carbohydrates with high-quality protein. Snack on nuts and seeds instead of chips. Replace baked desserts with [place] yogurt. Eat protein foods from both plant and animal sources. Replace red meat with seafood. Add beans and peas to salads, soups, and side dishes. Include a protein food with each meal and snack. Reading food labels You can find the amount of protein in a food item by looking at the nutrition facts label. Use the total grams listed to help you reach your daily goal. What foods are high in protein?  High-protein foods contain 4 grams (g) or more of protein per serving. They include: Grains Quinoa (cooked) -- 1 cup (185 g) has 8 g of protein. Whole wheat pasta (cooked) -- 1 cup (140 g) has 6 g of protein. Meat Beef, ground sirloin  (cooked) -- 3 oz (85 g) has 24 g of protein. Chicken breast, boneless and skinless (cooked) -- 3 oz (85 g) has 25 g of protein. Egg -- 1 egg has 6 g of protein. Fish, filet (cooked) -- 1 oz (28 g) has 6-7 g of protein. Lamb (cooked) -- 3 oz (85 g) has 24 g of protein. Pork tenderloin (cooked) -- 3 oz (85 g) has 23 g of protein. Tuna (canned in water) -- 3 oz (85 g) has 20 g of protein. Dairy Cottage cheese --  cup (114 g) has 13.4 g of protein. Milk -- 1 cup (237 mL) has 8 g of protein. Cheese (hard) -- 1 oz (28 g) has 7 g of protein. Yogurt, regular -- 6 oz (170 g) has 8 g of protein. Greek yogurt -- 6 oz (200 g) has 18 g protein. Plant protein Garbanzo beans (canned or cooked) --  cup (130 g) has 6-7 g of protein. Kidney beans (canned or cooked) --  cup (130 g) has 6-7 g of protein. Nuts (peanuts, pistachios, almonds) -- 1 oz (28 g) has 6 g of protein. Peanut butter -- 1 oz (32 g) has 7-8 g of protein. Pumpkin seeds -- 1 oz (28 g) has 8.5 g of protein. Soybeans (roasted) -- 1 oz (28 g) has 8 g of protein. Soybeans (cooked) --  cup (90 g) has 11 g of protein. Soy milk -- 1 cup (250 mL) has  5-10 g of protein. Soy or vegetable patty -- 1 patty has 11 g of protein. Sunflower seeds -- 1 oz (28 g) has 5.5 g of protein. Buckwheat -- 1 oz (33 g) has 4.3 g of protein. Tofu (firm) --  cup (124 g) has 20 g of protein. Tempeh --  cup (83 g) has 16 g of protein. The items listed above may not be a complete list of foods high in protein. Actual amounts of protein may differ depending on processing. Contact a dietitian for more information. What foods are low in protein?  Low-protein foods contain 3 grams (g) or less of protein per serving. They include: Fruits Fruit or vegetable juice --  cup (125 mL) has 1 g of protein. Vegetables Beets (raw or cooked) --  cup (68 g) has 1.5 g of protein. Broccoli (raw or cooked) --  cup (44 g) has 2 g of protein. Collard greens (raw or cooked) --   cup (42 g) has 2 g of protein. Green beans (raw or cooked) --  cup (83 g) has 1 g of protein. Green peas (canned) --  cup (80 g) has 3.5 g of protein. Potato (baked with skin) -- 1 medium potato (173 g) has 3 g of protein. Spinach (cooked) --  cup (90 g) has 3 g of protein. Squash (cooked) --  cup (90 g) has 1.5 g of protein. Avocado -- 1 cup (146 g) has 2.7 g of protein. Grains Bran cereal --  cup (30 g) has 2-3 g of protein. Bread -- 1 slice has 2.5 g of protein. Corn (fresh or cooked) --  cup (77 g) has 2 g of protein. Flour tortilla -- One 6-inch (15 cm) tortilla has 2.5 g of protein. Muffins -- 1 small muffin (2 oz or 57 g) has 3 g of protein. Oatmeal (cooked) --  cup (40 g) has 3 g of protein. Rice (cooked) --  cup (79 g) has 2.5-3.5 g of protein. Dairy Cream cheese -- 1 oz (29 g) has 2 g of protein. Creamer (half-and-half) -- 1 oz (29 mL) has 1 g of protein. Frozen yogurt --  cup (72 g) has 3 g of protein. Sour cream --  cup (75 g) has 2.5 g of protein. The items listed above may not be a complete list of foods low in protein. Actual amounts of protein may differ depending on processing. Contact a dietitian for more information. Summary Protein is a nutrient that your body needs for growth and development, repairing and making cells and tissues, fighting infection, and providing energy. Protein is in both plant and animal foods. Some of these foods have more protein than others. Depending on your overall health, you may need more or less protein in your diet. Talk to your health care provider about how much protein you need. This information is not intended to replace advice given to you by your health care provider. Make sure you discuss any questions you have with your health care provider. Document Revised: 05/18/2020 Document Reviewed: 05/18/2020 Elsevier Patient Education  [place].

## 2022-08-19 NOTE — Progress Notes (Signed)
Heidi Hernandez is 6 weeks out from robotic toupee fundoplication and diaphragmatic hernia repair. She has some nausea but is able to take p.o. Recently had a COVID infection, still recovering from it  PE NAD Abd: soft, nt, incisions c/d/I  A/P well I think that COVID infection played a role with her GI symptoms.  Recommend symptomatic management.  Return to clinic in a couple months

## 2022-08-30 ENCOUNTER — Ambulatory Visit (INDEPENDENT_AMBULATORY_CARE_PROVIDER_SITE_OTHER): Payer: Medicare HMO | Admitting: Physician Assistant

## 2022-08-30 ENCOUNTER — Telehealth: Payer: Self-pay | Admitting: Family Medicine

## 2022-08-30 ENCOUNTER — Encounter: Payer: Self-pay | Admitting: Physician Assistant

## 2022-08-30 ENCOUNTER — Other Ambulatory Visit: Payer: Self-pay | Admitting: Physician Assistant

## 2022-08-30 VITALS — BP 160/71 | HR 62 | Wt 115.2 lb

## 2022-08-30 DIAGNOSIS — H60502 Unspecified acute noninfective otitis externa, left ear: Secondary | ICD-10-CM | POA: Diagnosis not present

## 2022-08-30 DIAGNOSIS — H6122 Impacted cerumen, left ear: Secondary | ICD-10-CM

## 2022-08-30 MED ORDER — CIPROFLOXACIN-DEXAMETHASONE 0.3-0.1 % OT SUSP: 4.0000 [drp] | Freq: Two times a day (BID) | OTIC | 0 refills | Status: DC

## 2022-08-30 MED ORDER — NEOMYCIN-POLYMYXIN-HC 3.5-10000-1 OT SOLN: 3.0000 [drp] | Freq: Three times a day (TID) | OTIC | 0 refills | Status: DC

## 2022-08-30 MED ORDER — CIPRO HC 0.2-1 % OT SUSP: 3.0000 [drp] | Freq: Two times a day (BID) | OTIC | 0 refills | Status: DC

## 2022-08-30 NOTE — Telephone Encounter (Signed)
Patient advised.

## 2022-08-30 NOTE — Telephone Encounter (Signed)
Sent in an alternative

## 2022-08-30 NOTE — Telephone Encounter (Signed)
Pt states that she spoke with her insurance company and they advised for her to get meomycin polymyxin hydrocortisone to be called in. It will be cheaper than the other two medications that was called in for her. Please advise.

## 2022-08-30 NOTE — Telephone Encounter (Signed)
Copied from [place] [phone] [phone]. Topic: General - Inquiry >> Aug 30, 2022 11:35 AM Rosanne Ashing P wrote: Reason for CRM: pt called saying she can't get the prescription because it is over 100.00  she ask if there is something else she can use.  CB@  [phone]

## 2022-08-30 NOTE — Progress Notes (Signed)
I,Sha'taria Tyson,acting as a Education administrator for [employer], PA-C.,have documented all relevant documentation on the behalf of [name], PA-C,as directed by  [name], PA-C while in the presence of [name], PA-C.   Established patient visit   Patient: Heidi Hernandez   DOB: [birth year]   [name]  MRN: [id number] Visit Date: 08/30/2022  Today's healthcare provider: [name], PA-C   Cc. Left ear pain x 1 week  Subjective     Pt reports left ear pain, muffled sensation x 1 week. She feels as if she has water stuck. Reports pain radiates to behind left ear. Denies discharge, itching.  Reports associated nasal congestion, denies fever.  Medications: Outpatient Medications Prior to Visit  Medication Sig   albuterol (VENTOLIN HFA) 108 (90 Base) MCG/ACT inhaler Inhale 2 puffs into the lungs every 6 (six) hours as needed for wheezing. May substitute generic or equivalent brand name   alendronate (FOSAMAX) 70 MG tablet TAKE 1 TABLET BY MOUTH ONCE WEEKLY ON AN EMPTY STOMACH BEFORE BREAKFAST. REMAIN UPRIGHT FOR 30 MINUTES AND TAKE WITH 8 OUNCES OF WATER   amLODipine (NORVASC) 10 MG tablet Take 1 tablet (10 mg total) by mouth daily.   atenolol (TENORMIN) 25 MG tablet Take 1 tablet (25 mg total) by mouth daily.   azelastine (ASTELIN) 0.1 % nasal spray Place 2 sprays into both nostrils as needed.   EPINEPHrine 0.3 mg/0.3 mL IJ SOAJ injection as needed.   Fexofenadine HCl (ALLEGRA PO) Take 1 capsule by mouth daily.   fluticasone (FLONASE) 50 MCG/ACT nasal spray Place 2 sprays into both nostrils as needed for allergies.   fluticasone (FLOVENT HFA) 44 MCG/ACT inhaler Inhale 1 puff into the lungs daily.   ibuprofen (ADVIL) 600 MG tablet Take 1 tablet (600 mg total) by mouth every 6 (six) hours as needed.   methadone (DOLOPHINE) 5 MG tablet Take 5 mg by mouth daily.   montelukast (SINGULAIR) 10 MG tablet Take 10 mg by mouth at bedtime.    ondansetron (ZOFRAN ODT) 4 MG  disintegrating tablet Take 1 tablet (4 mg total) by mouth every 8 (eight) hours as needed for nausea or vomiting.   pramipexole (MIRAPEX) 0.5 MG tablet Take 1 tablet by mouth in the morning and at bedtime. 4 pm and 9pm daily   Vitamin D, Ergocalciferol, (DRISDOL) 1.25 MG (50000 UNIT) CAPS capsule Take 1 capsule (50,000 Units total) by mouth once a week.   omeprazole (PRILOSEC) 40 MG capsule Take 40 mg by mouth daily. (Patient not taking: Reported on 08/30/2022)   SUMAtriptan (IMITREX) 100 MG tablet TAKE 1 TABLET BY MOUTH AT ONSET OF HEADACHE. MAY TAKE AN ADDITIONAL TABLET IN 2 HOURS IF NEEDED. MAX OF '200mg'$ /24hr (Patient not taking: Reported on 08/30/2022)   No facility-administered medications prior to visit.    Review of Systems  Musculoskeletal:  Positive for neck pain.     Objective    BP (!) 160/71 (BP Location: Left Arm, Patient Position: Sitting, Cuff Size: Normal)   Pulse 62   Wt 115 lb 3.2 oz (52.3 kg)   SpO2 100%   BMI 19.77 kg/m   Physical Exam Vitals reviewed.  Constitutional:      Appearance: She is not ill-appearing.  HENT:     Head: Normocephalic.     Right Ear: Tympanic membrane normal.     Ears:     Comments: L ear canal initially impacted w/ cerumen After cleared -- erythematous ear canal deep, around TM.  TM without injection, effusion, bulging, erythema Eyes:     Conjunctiva/sclera: Conjunctivae normal.  Cardiovascular:     Rate and Rhythm: Normal rate.  Pulmonary:     Effort: Pulmonary effort is normal. No respiratory distress.  Neurological:     General: No focal deficit present.     Mental Status: She is alert and oriented to person, place, and time.  Psychiatric:        Mood and Affect: Mood normal.        Behavior: Behavior normal.      No results found for any visits on 08/30/22.  Assessment & Plan     Left otitis externa 2. Cerumen impaction, left Rx cipro HC drops If symptoms persist to call office for further recommendations  Return if  symptoms worsen or fail to improve.      I, [name], PA-C have reviewed all documentation for this visit. The documentation on  08/30/22 for the exam, diagnosis, procedures, and orders are all accurate and complete.  [name], PA-C [facility] [address] [place], [place], [address] Office: [phone] Fax: [place]

## 2022-08-30 NOTE — Telephone Encounter (Signed)
Pt requesting another medication be called in due to the cost of the ear drops.   She is currently at pharmacy waiting and wanted to see if this can be done right away because she lives 30 mins from pharmacy.

## 2022-09-09 DIAGNOSIS — J3081 Allergic rhinitis due to animal (cat) (dog) hair and dander: Secondary | ICD-10-CM | POA: Diagnosis not present

## 2022-09-09 DIAGNOSIS — L299 Pruritus, unspecified: Secondary | ICD-10-CM | POA: Diagnosis not present

## 2022-09-09 DIAGNOSIS — J3089 Other allergic rhinitis: Secondary | ICD-10-CM | POA: Diagnosis not present

## 2022-09-09 DIAGNOSIS — J301 Allergic rhinitis due to pollen: Secondary | ICD-10-CM | POA: Diagnosis not present

## 2022-09-09 DIAGNOSIS — J4530 Mild persistent asthma, uncomplicated: Secondary | ICD-10-CM | POA: Diagnosis not present

## 2022-09-19 ENCOUNTER — Encounter: Payer: Self-pay | Admitting: Surgery

## 2022-09-19 ENCOUNTER — Ambulatory Visit (INDEPENDENT_AMBULATORY_CARE_PROVIDER_SITE_OTHER): Payer: Medicare HMO | Admitting: Surgery

## 2022-09-19 VITALS — BP 138/82 | HR 62 | Temp 98.0°F | Ht 64.0 in | Wt 119.0 lb

## 2022-09-19 DIAGNOSIS — K449 Diaphragmatic hernia without obstruction or gangrene: Secondary | ICD-10-CM

## 2022-09-19 DIAGNOSIS — Z09 Encounter for follow-up examination after completed treatment for conditions other than malignant neoplasm: Secondary | ICD-10-CM

## 2022-09-19 NOTE — Patient Instructions (Addendum)
Do continue to have your Endoscopes as often as ordered by your GI doctor. Follow up with them as scheduled.     Follow-up with our office as needed.  Please call and ask to speak with a nurse if you develop questions or concerns.

## 2022-09-20 NOTE — Progress Notes (Signed)
Heidi Hernandez is 2 months for diaphragmatic hernia repair.  She is doing very well.  Dysphagia has resolved.  No reflux. Operative findings again discussed with the patient and husband in detail.  PE NAD Chest CTA NSR Abd: soft, nt incisions healed  A/P Doing well w/o complications RTC 2 months or so

## 2022-10-03 ENCOUNTER — Ambulatory Visit: Payer: Self-pay

## 2022-10-03 ENCOUNTER — Ambulatory Visit (INDEPENDENT_AMBULATORY_CARE_PROVIDER_SITE_OTHER): Payer: Medicare HMO | Admitting: Family Medicine

## 2022-10-03 VITALS — BP 161/74 | HR 63 | Temp 98.1°F | Wt 120.0 lb

## 2022-10-03 DIAGNOSIS — L282 Other prurigo: Secondary | ICD-10-CM

## 2022-10-03 DIAGNOSIS — R300 Dysuria: Secondary | ICD-10-CM | POA: Diagnosis not present

## 2022-10-03 DIAGNOSIS — N3000 Acute cystitis without hematuria: Secondary | ICD-10-CM

## 2022-10-03 LAB — POCT URINALYSIS DIPSTICK
Bilirubin, UA: NEGATIVE
Glucose, UA: NEGATIVE
Ketones, UA: NEGATIVE
Nitrite, UA: NEGATIVE
Protein, UA: NEGATIVE
Spec Grav, UA: 1.025 (ref 1.010–1.025)
Urobilinogen, UA: 0.2 E.U./dL
pH, UA: 6 (ref 5.0–8.0)

## 2022-10-03 MED ORDER — CEPHALEXIN 500 MG PO CAPS: 500.0000 mg | ORAL_CAPSULE | Freq: Four times a day (QID) | ORAL | 0 refills | Status: AC

## 2022-10-03 MED ORDER — TRIAMCINOLONE ACETONIDE 0.1 % EX CREA: 1.0000 | TOPICAL_CREAM | Freq: Two times a day (BID) | CUTANEOUS | 0 refills | Status: DC

## 2022-10-03 NOTE — Telephone Encounter (Signed)
Summary: possible UTI   Patient experiencing burning and frequent urination. She go to the bathroom but doesn't feel like she has emptied her bladder. Please advise     Chief Complaint: Urinary frequency, burning, feels like not emptying bladder Symptoms: Above Frequency: Yesterday Pertinent Negatives: Patient denies fever Disposition: [] ED /[] Urgent Care (no appt availability in office) / [x] Appointment(In office/virtual)/ []  [facility]/ [] Home Care/ [] Refused Recommended Disposition /[] [place]/ []  Follow-up with PCP Additional Notes:   Reason for Disposition  Urinating more frequently than usual (i.e., frequency)  Answer Assessment - Initial Assessment Questions 1. SYMPTOM: "What's the main symptom you're concerned about?" (e.g., frequency, incontinence)     Frequency, burning 2. ONSET: "When did the    start?"     Yesterday 3. PAIN: "Is there any pain?" If Yes, ask: "How bad is it?" (Scale: 1-10; mild, moderate, severe)     Yes 4. CAUSE: "What do you think is causing the symptoms?"     UTI 5. OTHER SYMPTOMS: "Do you have any other symptoms?" (e.g., blood in urine, fever, flank pain, pain with urination)     Not emptying 6. PREGNANCY: "Is there any chance you are pregnant?" "When was your last menstrual period?"     No  Protocols used: Urinary Symptoms-A-AH

## 2022-10-03 NOTE — Progress Notes (Signed)
Vivien Rota DeSanto,acting as a scribe for [name], MD.,have documented all relevant documentation on the behalf of [name], MD,as directed by  [name], MD while in the presence of [name], MD.     Established patient visit   Patient: Heidi Hernandez   DOB: May 14, 1951   [name]  MRN: [id number] Visit Date: 10/03/2022  Today's healthcare provider: [name], MD    Subjective    HPI  Urinary symptoms  Patient had a few days of feeling bad, weak and tired.  She then started yesterday with urinary frequency, burning and retention.     Associated symptoms: No abdominal pain No back pain  No chills No constipation  No cramping No diarrhea  No discharge No fever  No hematuria No nausea  No vomiting    ---------------------------------------------------------------------------------------   Medications: Outpatient Medications Prior to Visit  Medication Sig   albuterol (VENTOLIN HFA) 108 (90 Base) MCG/ACT inhaler Inhale 2 puffs into the lungs every 6 (six) hours as needed for wheezing. May substitute generic or equivalent brand name   alendronate (FOSAMAX) 70 MG tablet TAKE 1 TABLET BY MOUTH ONCE WEEKLY ON AN EMPTY STOMACH BEFORE BREAKFAST. REMAIN UPRIGHT FOR 30 MINUTES AND TAKE WITH 8 OUNCES OF WATER   amLODipine (NORVASC) 10 MG tablet Take 1 tablet (10 mg total) by mouth daily.   atenolol (TENORMIN) 25 MG tablet Take 1 tablet (25 mg total) by mouth daily.   azelastine (ASTELIN) 0.1 % nasal spray Place 2 sprays into both nostrils as needed.   EPINEPHrine 0.3 mg/0.3 mL IJ SOAJ injection as needed.   Fexofenadine HCl (ALLEGRA PO) Take 1 capsule by mouth daily.   fluticasone (FLONASE) 50 MCG/ACT nasal spray Place 2 sprays into both nostrils as needed for allergies.   fluticasone (FLOVENT HFA) 44 MCG/ACT inhaler Inhale 1 puff into the lungs daily.   ibuprofen (ADVIL) 600 MG tablet Take 1 tablet (600 mg total) by mouth every 6 (six) hours as needed.    methadone (DOLOPHINE) 5 MG tablet Take 5 mg by mouth daily.   montelukast (SINGULAIR) 10 MG tablet Take 10 mg by mouth at bedtime.    neomycin-polymyxin-hydrocortisone (CORTISPORIN) OTIC solution Place 3 drops into the left ear 3 (three) times daily.   omeprazole (PRILOSEC) 40 MG capsule Take 40 mg by mouth daily.   ondansetron (ZOFRAN ODT) 4 MG disintegrating tablet Take 1 tablet (4 mg total) by mouth every 8 (eight) hours as needed for nausea or vomiting.   pramipexole (MIRAPEX) 0.5 MG tablet Take 1 tablet by mouth in the morning and at bedtime. 4 pm and 9pm daily   SUMAtriptan (IMITREX) 100 MG tablet TAKE 1 TABLET BY MOUTH AT ONSET OF HEADACHE. MAY TAKE AN ADDITIONAL TABLET IN 2 HOURS IF NEEDED. MAX OF 200mg /24hr   Vitamin D, Ergocalciferol, (DRISDOL) 1.25 MG (50000 UNIT) CAPS capsule Take 1 capsule (50,000 Units total) by mouth once a week.   No facility-administered medications prior to visit.    Review of Systems  Constitutional:  Positive for fatigue. Negative for fever.  Genitourinary:  Positive for difficulty urinating, dysuria, frequency and urgency. Negative for flank pain and hematuria.        Objective    BP (!) 161/74 (BP Location: Left Arm, Patient Position: Sitting, Cuff Size: Normal)   Pulse 63   Temp 98.1 F (36.7 C) (Oral)   Wt 120 lb (54.4 kg)   SpO2 100%   BMI 20.60 kg/m  Vitals:  10/03/22 1122 10/03/22 1125  BP: (!) 172/77 (!) 161/74  Pulse: 63   Temp: 98.1 F (36.7 C)   TempSrc: Oral   SpO2: 100%   Weight: 120 lb (54.4 kg)     Physical Exam  Several small red papular lesions dorsum of right forearm which patient reports as being very itching.   Results for orders placed or performed in visit on 10/03/22  POCT urinalysis dipstick  Result Value Ref Range   Color, UA yellow    Clarity, UA cloudy    Glucose, UA Negative Negative   Bilirubin, UA neg    Ketones, UA neg    Spec Grav, UA 1.025 1.010 - 1.025   Blood, UA trace    pH, UA 6.0 5.0 - 8.0    Protein, UA Negative Negative   Urobilinogen, UA 0.2 0.2 or 1.0 E.U./dL   Nitrite, UA neg    Leukocytes, UA Large (3+) (A) Negative   Appearance     Odor      Assessment & Plan     1. Dysuria   2. Acute cystitis without hematuria  - Urine Culture - cephALEXin (KEFLEX) 500 MG capsule; Take 1 capsule (500 mg total) by mouth 4 (four) times daily for 7 days.  Dispense: 28 capsule; Refill: 0  3. Papular urticaria  - triamcinolone cream (KENALOG) 0.1 %; Apply 1 Application topically 2 (two) times daily.  Dispense: 15 g; Refill: 0      The entirety of the information documented in the History of Present Illness, Review of Systems and Physical Exam were personally obtained by me. Portions of this information were initially documented by the CMA and reviewed by me for thoroughness and accuracy.     [name], MD  [facility] [phone] (phone) [phone] (fax)  [facility]

## 2022-10-06 DIAGNOSIS — G2581 Restless legs syndrome: Secondary | ICD-10-CM | POA: Diagnosis not present

## 2022-10-06 LAB — SPECIMEN STATUS REPORT

## 2022-10-06 LAB — URINE CULTURE

## 2022-10-18 ENCOUNTER — Ambulatory Visit: Payer: Self-pay | Admitting: *Deleted

## 2022-10-18 DIAGNOSIS — R55 Syncope and collapse: Secondary | ICD-10-CM | POA: Diagnosis not present

## 2022-10-18 DIAGNOSIS — W19XXXA Unspecified fall, initial encounter: Secondary | ICD-10-CM | POA: Diagnosis not present

## 2022-10-18 DIAGNOSIS — R519 Headache, unspecified: Secondary | ICD-10-CM | POA: Diagnosis not present

## 2022-10-18 DIAGNOSIS — N3000 Acute cystitis without hematuria: Secondary | ICD-10-CM

## 2022-10-18 DIAGNOSIS — Z885 Allergy status to narcotic agent status: Secondary | ICD-10-CM | POA: Diagnosis not present

## 2022-10-18 DIAGNOSIS — Z043 Encounter for examination and observation following other accident: Secondary | ICD-10-CM | POA: Diagnosis not present

## 2022-10-18 DIAGNOSIS — Z888 Allergy status to other drugs, medicaments and biological substances status: Secondary | ICD-10-CM | POA: Diagnosis not present

## 2022-10-18 DIAGNOSIS — I10 Essential (primary) hypertension: Secondary | ICD-10-CM | POA: Diagnosis not present

## 2022-10-18 DIAGNOSIS — R42 Dizziness and giddiness: Secondary | ICD-10-CM | POA: Diagnosis not present

## 2022-10-18 MED ORDER — SULFAMETHOXAZOLE-TRIMETHOPRIM 800-160 MG PO TABS
1.0000 | ORAL_TABLET | Freq: Two times a day (BID) | ORAL | 0 refills | Status: AC
Start: 2022-10-18 — End: 2022-10-25

## 2022-10-18 NOTE — Telephone Encounter (Signed)
Patient advised as below.  

## 2022-10-18 NOTE — Telephone Encounter (Signed)
Have sent prescription for different antibiotic to [facility]. Need follow up office visit if it doesn't clear up or if sx return after finishing antibiotic

## 2022-10-18 NOTE — Telephone Encounter (Signed)
  Chief Complaint: Return of UTI symptoms after completing 7 days of Keflex.   Seen by Dr. Sherrie Mustache on 10/03/2022 Symptoms: burning with urination, heavy feeling over bladder area and very tired feeling. Frequency: The symptoms started last night with burning and this morning she feels worse. Pertinent Negatives: Patient denies N/A Disposition: ED /[] Urgent Care (no appt availability in office) / Appointment(In office/virtual)/  [place] [facility]/ Home Care/ Refused Recommended Disposition /[] [place] [facility]/  Follow-up with PCP Additional Notes: Message sent to Dr. Sherrie Mustache.    Pt. Agreeable to someone calling her back with his recommendation.

## 2022-10-18 NOTE — Addendum Note (Signed)
Addended by: [name] E on: 10/18/2022 01:00 PM   Modules accepted: Orders

## 2022-10-18 NOTE — Telephone Encounter (Signed)
  Chief Complaint: Passed Out Symptoms: Reports took Bactrim few hours ago, nausea, "Heaving" States went in [place] and passed out. Unsure how long, "I don't think too long." Unwitnessed. Presently dizzy, "Horrible headache.' Knot on back of head. Now dizzy Frequency: Few minutes prior to call Pertinent Negatives: Patient denies vomiting, is not on blood thinners. Disposition: ED /[] Urgent Care (no appt availability in office) / Appointment(In office/virtual)/  [place] [facility]/ Home Care/ Refused Recommended Disposition /[] [place] [facility]/  Follow-up with PCP Additional Notes: Advised ED. Care advise provided, husband will drive. Reason for Disposition  [1] Fainted > 15 minutes ago AND [2] still feels weak or dizzy  Answer Assessment - Initial Assessment Questions 1. ONSET: "How long were you unconscious?" (minutes) "When did it happen?"     Unsure "Not too long." 2. CONTENT: "What happened during period of unconsciousness?" (e.g., seizure activity)      Not witnessed 3. MENTAL STATUS: "Alert and oriented now?" (oriented x 3 = name, month, location)      Yes 4. TRIGGER: "What do you think caused the fainting?" "What were you doing just before you fainted?"  (e.g., exercise, sudden standing up, prolonged standing)     Nausea from ATB 5. RECURRENT SYMPTOM: "Have you ever passed out before?" If Yes, ask: "When was the last time?" and "What happened that time?"      No 6. INJURY: "Did you sustain any injury during the fall?"      Hit head, knot back of head 7. CARDIAC SYMPTOMS: "Have you had any of the following symptoms: chest pain, difficulty breathing, palpitations?"     No 8. NEUROLOGIC SYMPTOMS: "Have you had any of the following symptoms: headache, numbness, vertigo, weakness?"     No 9. GI SYMPTOMS: "Have you had any of the following symptoms: abdomen pain, vomiting, diarrhea, blood in stools?"     Nausea from bactrim. 10. OTHER SYMPTOMS: "Do you have any other  symptoms?"       Nausea, headache  Protocols used: Fainting-A-AH

## 2022-10-18 NOTE — Telephone Encounter (Signed)
Message from Heidi Hernandez sent at 10/18/2022  8:05 AM EDT  Summary: UTI symptoms returned   Pt called back reporting that she has completed her antibiotic cycle for a UTI however all of her symptoms have returned. She is seeking a refill please advise  Best contact: [phone]          Call History   Type Contact Phone/Fax User  10/18/2022 08:05 AM EDT Phone (Incoming) [name], [name] (Self) [phone] [name]) Heidi Hernandez   Reason for Disposition  [1] Taking antibiotic > 72 hours (3 days) for UTI AND [2] painful urination or frequency is SAME (unchanged, not better)    Symptoms went away with the Keflex then returned last night with burning with urination and a heavy feeling over the bladder area.  Answer Assessment - Initial Assessment Questions 1. MAIN SYMPTOM: "What is the main symptom you are concerned about?" (e.g., painful urination, urine frequency)    Last night the burning with urination started again and I was really tired all day yesterday.   I have a heavy feeling over my bladder area.   This is the same symptoms I had when Dr. Sherrie Mustache prescribed the antibiotic for the UTI. (Seen 10/03/2022 by Dr. Sherrie Mustache for UTI.  Keflex ordered for 7 days). 2. BETTER-SAME-WORSE: "Are you getting better, staying the same, or getting worse compared to how you felt at your last visit to the doctor (most recent medical visit)?"     My symptoms had gone away but as of last night they started coming back and this morning the burning and heavy feeling down there are worse. 3. PAIN: "How bad is the pain?"  (e.g., Scale 1-10; mild, moderate, or severe)   - MILD (1-3): complains slightly about urination hurting   - MODERATE (4-7): interferes with normal activities     - SEVERE (8-10): excruciating, unwilling or unable to urinate because of the pain      Moderate with burning with urination and a heavy feeling over bladder area. 4. FEVER: "Do you have a fever?" If Yes, ask: "What is it, how  was it measured, and when did it start?"     Not asked 5. OTHER SYMPTOMS: "Do you have any other symptoms?" (e.g., blood in the urine, flank pain, vaginal discharge)     Felt very tired yesterday all day which is how she felt when the UTI started. 6. DIAGNOSIS: "When was the UTI diagnosed?" "By whom?" "Was it a kidney infection, bladder infection or both?"     10/03/2022 by Dr. Sherrie Mustache.   UTI 7. ANTIBIOTIC: "What antibiotic(s) are you taking?" "How many times per day?"     Keflex for 7 days. 8. ANTIBIOTIC - START DATE: "When did you start taking the antibiotic?"     10/03/2022  Protocols used: Urinary Tract Infection on Antibiotic Follow-up Call - [facility]

## 2022-10-19 ENCOUNTER — Telehealth: Payer: Self-pay

## 2022-10-19 NOTE — Telephone Encounter (Signed)
Copied from CRM [id number]. Topic: General - Other >> Oct 19, 2022  9:24 AM Macon Large wrote: Reason for CRM: Pt reports that she went to [facility] ED as suggested by the nurse and she was diagnosed with having a concussion. Pt stated that she is feeling better today she just has a headache.

## 2022-10-20 DIAGNOSIS — R55 Syncope and collapse: Secondary | ICD-10-CM | POA: Diagnosis not present

## 2022-10-21 ENCOUNTER — Other Ambulatory Visit: Payer: Self-pay | Admitting: Family Medicine

## 2022-10-21 DIAGNOSIS — G43819 Other migraine, intractable, without status migrainosus: Secondary | ICD-10-CM

## 2022-10-21 NOTE — Telephone Encounter (Signed)
Requested medication (s) are due for refill today -expired Rx  Requested medication (s) are on the active medication list -yes  Future visit scheduled -no  Last refill: 06/30/21 #9 2RF  Notes to clinic: expired Rx  Requested Prescriptions  Pending Prescriptions Disp Refills   SUMAtriptan (IMITREX) 100 MG tablet [Pharmacy Med Name: SUMATRIPTAN SUCC 100 MG TABLET] 9 tablet 2    Sig: TAKE 1 TABLET BY MOUTH AT ONSET OF HEADACHE. MAY TAKE AN ADDITIONAL TABLET IN 2 HOURS IF NEEDED. MAX OF 200MG  IN 24 HOURS     Neurology:  Migraine Therapy - Triptan Failed - 10/21/2022  1:10 PM      Failed - Last BP in normal range    BP Readings from Last 1 Encounters:  10/03/22 (!) 161/74         Passed - Valid encounter within last 12 months    Recent Outpatient Visits           2 weeks ago Dysuria   [facility] Malva Limes, MD   1 month ago Acute otitis externa of left ear, unspecified type   [facility] Alfredia Ferguson, PA-C   2 months ago COVID-19   [facility] Sherrie Mustache, Demetrios Isaacs, MD   5 months ago Nocturia   [facility] Malva Limes, MD   5 months ago Restless legs syndrome   [facility] Caro Laroche, DO       Future Appointments             In 1 week Orlie Dakin, Tollie Pizza, MD [facility]               Requested Prescriptions  Pending Prescriptions Disp Refills   SUMAtriptan (IMITREX) 100 MG tablet [Pharmacy Med Name: SUMATRIPTAN SUCC 100 MG TABLET] 9 tablet 2    Sig: TAKE 1 TABLET BY MOUTH AT ONSET OF HEADACHE. MAY TAKE AN ADDITIONAL TABLET IN 2 HOURS IF NEEDED. MAX OF 200MG  IN 24 HOURS     Neurology:  Migraine Therapy - Triptan Failed - 10/21/2022  1:10 PM      Failed - Last BP in normal range    BP Readings from Last 1 Encounters:  10/03/22 (!) 161/74         Passed - Valid encounter within  last 12 months    Recent Outpatient Visits           2 weeks ago Dysuria   [facility] Malva Limes, MD   1 month ago Acute otitis externa of left ear, unspecified type   [facility] Alfredia Ferguson, PA-C   2 months ago COVID-19   [facility] Sherrie Mustache, Demetrios Isaacs, MD   5 months ago Nocturia   [facility] Malva Limes, MD   5 months ago Restless legs syndrome   [facility] Caro Laroche, DO       Future Appointments             In 1 week Orlie Dakin, Tollie Pizza, MD [facility]

## 2022-10-22 ENCOUNTER — Encounter: Payer: Self-pay | Admitting: Emergency Medicine

## 2022-10-22 ENCOUNTER — Emergency Department: Payer: Medicare HMO

## 2022-10-22 ENCOUNTER — Emergency Department
Admission: EM | Admit: 2022-10-22 | Discharge: 2022-10-22 | Disposition: A | Discharge status: Home / Self Care | Payer: Medicare HMO | Attending: Emergency Medicine | Admitting: Emergency Medicine

## 2022-10-22 DIAGNOSIS — Y9351 Activity, roller skating (inline) and skateboarding: Secondary | ICD-10-CM | POA: Diagnosis not present

## 2022-10-22 DIAGNOSIS — M25531 Pain in right wrist: Secondary | ICD-10-CM | POA: Diagnosis not present

## 2022-10-22 DIAGNOSIS — M545 Low back pain, unspecified: Secondary | ICD-10-CM | POA: Diagnosis not present

## 2022-10-22 DIAGNOSIS — S62111A Displaced fracture of triquetrum [cuneiform] bone, right wrist, initial encounter for closed fracture: Secondary | ICD-10-CM

## 2022-10-22 DIAGNOSIS — M4316 Spondylolisthesis, lumbar region: Secondary | ICD-10-CM | POA: Diagnosis not present

## 2022-10-22 DIAGNOSIS — I10 Essential (primary) hypertension: Secondary | ICD-10-CM | POA: Diagnosis not present

## 2022-10-22 DIAGNOSIS — R0781 Pleurodynia: Secondary | ICD-10-CM | POA: Diagnosis not present

## 2022-10-22 DIAGNOSIS — R079 Chest pain, unspecified: Secondary | ICD-10-CM | POA: Diagnosis not present

## 2022-10-22 MED ORDER — ACETAMINOPHEN 325 MG PO TABS
650.0000 mg | ORAL_TABLET | Freq: Once | ORAL | Status: AC
  Administered 2022-10-22: 650 mg via ORAL
  Filled 2022-10-22: qty 2

## 2022-10-22 MED ORDER — OXYCODONE-ACETAMINOPHEN 5-325 MG PO TABS
1.0000 | ORAL_TABLET | Freq: Once | ORAL | Status: AC
  Administered 2022-10-22: 1 via ORAL
  Filled 2022-10-22: qty 1

## 2022-10-22 MED ORDER — OXYCODONE-ACETAMINOPHEN 5-325 MG PO TABS: 1.0000 | ORAL_TABLET | Freq: Four times a day (QID) | ORAL | 0 refills | Status: AC | PRN

## 2022-10-22 NOTE — ED Provider Notes (Signed)
[facility] Provider Note    Event Date/Time   First MD Initiated Contact with Patient 10/22/22 [phone]     (approximate)   History   Fall   HPI  Heidi Hernandez is a 72 y.o. female with a history of hypertension, GERD, mitral valve prolapse, anxiety who was in her usual state of health today rollerskating with her granddaughter, when she fell, landing on her buttocks.  She did put her right arm out to try and catch herself.  She complains of pain in the right wrist, lower back, and anterior chest over the xiphoid.  No shortness of breath, no head injury or loss of consciousness or other injuries.     Physical Exam   Triage Vital Signs: ED Triage Vitals [10/22/22 0115]  Enc Vitals Group     BP (!) 170/87     Pulse Rate 90     Resp 20     Temp 98.9 F (37.2 C)     Temp Source Oral     SpO2 98 %     Weight 121 lb 11.1 oz (55.2 kg)     Height 5\' 4"  (1.626 m)     Head Circumference      Peak Flow      Pain Score 10     Pain Loc      Pain Edu?      Excl. in GC?     Most recent vital signs: Vitals:   10/22/22 0115 10/22/22 0606  BP: (!) 170/87 (!) 164/79  Pulse: 90 68  Resp: 20 18  Temp: 98.9 F (37.2 C)   SpO2: 98% 99%    General: Awake, no distress.  CV:  Good peripheral perfusion.  Regular rate and rhythm.  Normal distal pulses Resp:  Normal effort.  Clear to auscultation bilaterally Abd:  No distention.  Soft nontender. Other:  Chest wall stable with mild tenderness directly over the xiphoid process which reproduces the chest pain.  No spinal tenderness.  There is tenderness over the right ulnar wrist with localized swelling.  No signs of head trauma.  ED Results / Procedures / Treatments   Labs (all labs ordered are listed, but only abnormal results are displayed) Labs Reviewed - No data to display   RADIOLOGY X-ray chest interpreted by me, appears normal.  Radiology report reviewed.  X-ray lumbar spine unremarkable  X-ray  right wrist shows a triquetral fracture.   PROCEDURES:  .Ortho Injury Treatment  Date/Time: 10/22/2022 5:34 AM  Performed by: Sharman Cheek, MD Authorized by: Sharman Cheek, MD   Consent:    Consent obtained:  Verbal   Consent given by:  Patient   Risks discussed:  Stiffness and restricted joint movement   Alternatives discussed:  No treatmentInjury location: wrist Location details: right wrist Injury type: fracture Fracture type: triquetral Pre-procedure neurovascular assessment: neurovascularly intact Pre-procedure distal perfusion: normal Pre-procedure neurological function: normal Pre-procedure range of motion: normal  Anesthesia: Local anesthesia used: no  Patient sedated: NoManipulation performed: no Immobilization: splint Splint type: thumb spica Splint Applied by: ED Nurse Supplies used: cotton padding, elastic bandage and Ortho-Glass Post-procedure neurovascular assessment: post-procedure neurovascularly intact Post-procedure distal perfusion: normal Post-procedure neurological function: normal Post-procedure range of motion: normal      MEDICATIONS ORDERED IN ED: Medications  oxyCODONE-acetaminophen (PERCOCET/ROXICET) 5-325 MG per tablet 1 tablet (1 tablet Oral Given 10/22/22 0510)  acetaminophen (TYLENOL) tablet 650 mg (650 mg Oral Given 10/22/22 0510)     IMPRESSION / MDM /  ASSESSMENT AND PLAN / ED COURSE  I reviewed the triage vital signs and the nursing notes.                              Differential diagnosis includes, but is not limited to, wrist fracture, L-spine fracture, pneumothorax, rib fracture  Patient presents after mechanical fall from rollerskating.  Has visible wrist trauma.  X-ray concerning for a carpal fracture.  Thumb spica placed to immobilize the wrist and protect the snuffbox structures until she can follow-up with orthopedics despite the injury being on the ulnar wrist.  No other apparent injuries, pain is controlled.      FINAL CLINICAL IMPRESSION(S) / ED DIAGNOSES   Final diagnoses:  Displaced fracture of triquetrum (cuneiform) bone, right wrist, initial encounter for closed fracture     Rx / DC Orders   ED Discharge Orders          Ordered    oxyCODONE-acetaminophen (PERCOCET) 5-325 MG tablet  Every 6 hours PRN        10/22/22 0533             Note:  This document was prepared using Dragon voice recognition software and may include unintentional dictation errors.   Sharman Cheek, MD 10/22/22 [phone]

## 2022-10-22 NOTE — ED Triage Notes (Signed)
Pt presents ambulatory to triage via POV with complaints of R hand pain with associated lower back pain with bilateral rib pain following a fall this evening while skating. Pt states she slowed down and tripped landing on her buttocks - did not hit her head. No LOC. A&Ox4 at this time.

## 2022-10-27 ENCOUNTER — Telehealth: Payer: Self-pay

## 2022-10-27 NOTE — Telephone Encounter (Signed)
Transition Care Management Unsuccessful Follow-up Telephone Call  Date of discharge and from where:  10/22/2022, [facility].  Attempts:  1st Attempt  Reason for unsuccessful TCM follow-up call:  Left voice message  Farid Grigorian [place]  [facility] Community Resource Care Guide   [email or web address]  ?? [phone]   Website: [email or web address]  [email or web address]      

## 2022-10-28 ENCOUNTER — Telehealth: Payer: Self-pay

## 2022-10-28 DIAGNOSIS — S62114A Nondisplaced fracture of triquetrum [cuneiform] bone, right wrist, initial encounter for closed fracture: Secondary | ICD-10-CM | POA: Diagnosis not present

## 2022-10-28 DIAGNOSIS — S63511A Sprain of carpal joint of right wrist, initial encounter: Secondary | ICD-10-CM | POA: Diagnosis not present

## 2022-10-28 NOTE — Telephone Encounter (Signed)
Transition Care Management Unsuccessful Follow-up Telephone Call  Date of discharge and from where:  10/22/2022, [facility]  Attempts:  2nd Attempt  Reason for unsuccessful TCM follow-up call:  Left voice message  Heidi Hernandez [place]  [facility] Community Resource Care Guide   [email or web address]  ?? [phone]   Website: [email or web address]  [email or web address]      

## 2022-10-31 ENCOUNTER — Inpatient Hospital Stay: Payer: Medicare HMO | Attending: Oncology

## 2022-10-31 DIAGNOSIS — Z7951 Long term (current) use of inhaled steroids: Secondary | ICD-10-CM | POA: Diagnosis not present

## 2022-10-31 DIAGNOSIS — Z79899 Other long term (current) drug therapy: Secondary | ICD-10-CM | POA: Diagnosis not present

## 2022-10-31 DIAGNOSIS — Z87891 Personal history of nicotine dependence: Secondary | ICD-10-CM | POA: Diagnosis not present

## 2022-10-31 DIAGNOSIS — G2581 Restless legs syndrome: Secondary | ICD-10-CM | POA: Diagnosis not present

## 2022-10-31 LAB — COMPREHENSIVE METABOLIC PANEL
ALT: 14 U/L (ref 0–44)
AST: 22 U/L (ref 15–41)
Albumin: 3.9 g/dL (ref 3.5–5.0)
Alkaline Phosphatase: 67 U/L (ref 38–126)
Anion gap: 8 (ref 5–15)
BUN: 21 mg/dL (ref 8–23)
CO2: 23 mmol/L (ref 22–32)
Calcium: 9.1 mg/dL (ref 8.9–10.3)
Chloride: 109 mmol/L (ref 98–111)
Creatinine, Ser: 0.73 mg/dL (ref 0.44–1.00)
GFR, Estimated: >60 mL/min (ref >60)
Glucose, Bld: 104 mg/dL — ABNORMAL HIGH (ref 70–99)
Potassium: 3.7 mmol/L (ref 3.5–5.1)
Sodium: 140 mmol/L (ref 135–145)
Total Bilirubin: 0.4 mg/dL (ref 0.3–1.2)
Total Protein: 6.5 g/dL (ref 6.5–8.1)

## 2022-10-31 LAB — CBC WITH DIFFERENTIAL/PLATELET
Abs Immature Granulocytes: 0.02 10*3/uL (ref 0.00–0.07)
Basophils Absolute: 0.1 10*3/uL (ref 0.0–0.1)
Basophils Relative: 1 %
Eosinophils Absolute: 0.3 10*3/uL (ref 0.0–0.5)
Eosinophils Relative: 4 %
HCT: 35.5 % — ABNORMAL LOW (ref 36.0–46.0)
Hemoglobin: 12.2 g/dL (ref 12.0–15.0)
Immature Granulocytes: 0 %
Lymphocytes Relative: 36 %
Lymphs Abs: 2.9 10*3/uL (ref 0.7–4.0)
MCH: 32 pg (ref 26.0–34.0)
MCHC: 34.4 g/dL (ref 30.0–36.0)
MCV: 93.2 fL (ref 80.0–100.0)
Monocytes Absolute: 0.6 10*3/uL (ref 0.1–1.0)
Monocytes Relative: 8 %
Neutro Abs: 4.2 10*3/uL (ref 1.7–7.7)
Neutrophils Relative %: 51 %
Platelets: 180 10*3/uL (ref 150–400)
RBC: 3.81 MIL/uL — ABNORMAL LOW (ref 3.87–5.11)
RDW: 14 % (ref 11.5–15.5)
WBC: 8.2 10*3/uL (ref 4.0–10.5)
nRBC: 0 % (ref 0.0–0.2)

## 2022-10-31 LAB — IRON AND TIBC
Iron: 70 ug/dL (ref 28–170)
Saturation Ratios: 25 % (ref 10.4–31.8)
TIBC: 279 ug/dL (ref 250–450)
UIBC: 209 ug/dL

## 2022-10-31 LAB — FERRITIN: Ferritin: 36 ng/mL (ref 11–307)

## 2022-11-01 ENCOUNTER — Encounter: Payer: Self-pay | Admitting: Oncology

## 2022-11-01 ENCOUNTER — Inpatient Hospital Stay (HOSPITAL_BASED_OUTPATIENT_CLINIC_OR_DEPARTMENT_OTHER): Payer: Medicare HMO | Admitting: Oncology

## 2022-11-01 NOTE — Progress Notes (Signed)
[facility]  [phone] [phone]  ID: Heidi Hernandez OB: January 14, 1951  MR#: [id number]  GGY#:[id number]  Patient Care Team: Malva Limes, MD as PCP - General (Family Medicine) Eileen Stanford, MD as Referring Physician (Allergy and Immunology) Sallee Lange, MD as Consulting Physician (Ophthalmology) Mickle Mallory (Gastroenterology) Idelle Crouch, MD as Referring Physician (Neurology) Morene Crocker, MD as Referring Physician (Neurology)  I connected with Heidi Hernandez on 11/01/22 at  3:30 PM EDT by video enabled telemedicine visit and verified that I am speaking with the correct person using two identifiers.   I discussed the limitations, risks, security and privacy concerns of performing an evaluation and management service by telemedicine and the availability of in-person appointments. I also discussed with the patient that there may be a patient responsible charge related to this service. The patient expressed understanding and agreed to proceed.   Other persons participating in the visit and their role in the encounter: Patient, MD.  Patient's location: Home. Provider's location: Clinic.  CHIEF COMPLAINT: [name]  INTERVAL HISTORY: Patient agreed to video assisted telemedicine visit for further evaluation and discussion of her laboratory results.  She recently broke her wrist rollerskating, but otherwise has felt well.  She has no neurologic complaints today. She denies any recent fevers or illnesses.  She has a good appetite and denies weight loss.  She has no chest pain, shortness of breath, cough, or hemoptysis.  She denies any nausea, vomiting, constipation, or diarrhea.  She has no urinary complaints.  Patient offers no further specific complaints today.  REVIEW OF SYSTEMS:   Review of Systems  Constitutional: Negative.  Negative for fever, malaise/fatigue and weight loss.  Respiratory:   Negative for cough, hemoptysis and shortness of breath.   Cardiovascular: Negative.  Negative for chest pain and leg swelling.  Gastrointestinal: Negative.  Negative for abdominal pain and heartburn.  Genitourinary: Negative.  Negative for dysuria.  Musculoskeletal: Negative.  Negative for back pain.  Skin: Negative.  Negative for rash.  Neurological: Negative.  Negative for dizziness, focal weakness, weakness and headaches.  Psychiatric/Behavioral: Negative.  The patient does not have insomnia.     As per HPI. Otherwise, a complete review of systems is negative.  PAST MEDICAL HISTORY: Past Medical History:  Diagnosis Date   Anxiety    Arthritis    Asthma    GERD (gastroesophageal reflux disease)    Headache    Migraines   Heart murmur    Hypertension    Idiopathic thrombocytopenia purpura (HCC)    Required splenectomy   MVP (mitral valve prolapse)    Neuromuscular disorder (HCC)    Restless Leg Syndrome   Pancreatitis April 2017 [facility] due to cholecystitis.    Pneumonia    06-2015    PAST SURGICAL HISTORY: Past Surgical History:  Procedure Laterality Date   ABDOMINAL HYSTERECTOMY     APPENDECTOMY     CARPAL TUNNEL RELEASE Bilateral 11/19/2014   Procedure: CARPAL TUNNEL RELEASE;  Surgeon: Donato Heinz, MD;  Location: [facility] ORS;  Service: Orthopedics;  Laterality: Bilateral;   CESAREAN SECTION     times 2   CHOLECYSTECTOMY  09/29/2015   [facility]   COLONOSCOPY N/A 05/02/2016   Procedure: COLONOSCOPY;  Surgeon: Scot Jun, MD;  Location[facility] ENDOSCOPY;  Service: Endoscopy;  Laterality: N/A;   ESOPHAGOGASTRODUODENOSCOPY (EGD) WITH PROPOFOL N/A 05/02/2016   Procedure: ESOPHAGOGASTRODUODENOSCOPY (EGD) WITH PROPOFOL;  Surgeon: Scot Jun, MD;  Location: [facility]  ENDOSCOPY;  Service: Endoscopy;  Laterality: N/A;   ESOPHAGOGASTRODUODENOSCOPY (EGD) WITH PROPOFOL N/A 11/22/2017   Procedure: ESOPHAGOGASTRODUODENOSCOPY (EGD) WITH PROPOFOL;  Surgeon: Scot Jun, MD;   Location: [facility] ENDOSCOPY;  Service: Endoscopy;  Laterality: N/A;   HERNIA REPAIR     times 3   INSERTION OF MESH  07/05/2022   Procedure: INSERTION OF MESH;  Surgeon: Leafy Ro, MD;  Location: [facility] ORS;  Service: General;;   JOINT REPLACEMENT     OVARIAN CYST SURGERY     SHOULDER ARTHROSCOPY WITH ROTATOR CUFF REPAIR     times 2   SPLENECTOMY, TOTAL     TOTAL SHOULDER ARTHROPLASTY Right 04/21/2017   Procedure: REVERSE SHOULDER ARTHROPLASTY;  Surgeon: Signa Kell, MD;  Location: [facility] ORS;  Service: Orthopedics;  Laterality: Right;   TRIGGER FINGER RELEASE Right 11/19/2014   Procedure: RELEASE TRIGGER FINGER/A-1 PULLEY;  Surgeon: Donato Heinz, MD;  Location: [facility] ORS;  Service: Orthopedics;  Laterality: Right;   TRIGGER FINGER RELEASE Right 08/12/2015   Procedure: RELEASE TRIGGER LONG FINGER;  Surgeon: Donato Heinz, MD;  Location: [facility] ORS;  Service: Orthopedics;  Laterality: Right;   XI ROBOTIC ASSISTED PARAESOPHAGEAL HERNIA REPAIR  07/05/2022   Procedure: XI ROBOTIC ASSISTED PARAESOPHAGEAL HERNIA REPAIR;  Surgeon: Leafy Ro, MD;  Location: [facility] ORS;  Service: General;;    FAMILY HISTORY: Family History  Problem Relation Age of Onset   Hypertension Mother    Renal Disease Father        ESRD with HD    ADVANCED DIRECTIVES (Y/N):  N  HEALTH MAINTENANCE: Social History   Tobacco Use   Smoking status: Former    Packs/day: 1.00    Years: 10.00    Additional pack years: 0.00    Total pack years: 10.00    Types: Cigarettes    Quit date: 11/10/1979    Years since quitting: 43.0    Passive exposure: Past   Smokeless tobacco: Never  Vaping Use   Vaping Use: Never used  Substance Use Topics   Alcohol use: Not Currently    Alcohol/week: 0.0 standard drinks of alcohol   Drug use: No     Colonoscopy:  PAP:  Bone density:  Lipid panel:  Allergies  Allergen Reactions   Augmentin [Amoxicillin-Pot Clavulanate] Nausea And Vomiting   Cat Hair Extract     Other  reaction(s): Not available   Dog Epithelium (Canis Lupus Familiaris)     Other reaction(s): Not available   Droperidol Other (See Comments)    "Locked jaw" Facial muscles locked "Locked jaw"   Molds & Smuts     Other reaction(s): Not available   Paroxetine Hcl Other (See Comments)    syncope Passed out    Tree Extract     Other reaction(s): Not available   Morphine Itching    Current Outpatient Medications  Medication Sig Dispense Refill   albuterol (VENTOLIN HFA) 108 (90 Base) MCG/ACT inhaler Inhale 2 puffs into the lungs every 6 (six) hours as needed for wheezing. May substitute generic or equivalent brand name 18 g 1   alendronate (FOSAMAX) 70 MG tablet TAKE 1 TABLET BY MOUTH ONCE WEEKLY ON AN EMPTY STOMACH BEFORE BREAKFAST. REMAIN UPRIGHT FOR 30 MINUTES AND TAKE WITH 8 OUNCES OF WATER 12 tablet 1   amLODipine (NORVASC) 10 MG tablet Take 1 tablet (10 mg total) by mouth daily. 90 tablet 4   atenolol (TENORMIN) 25 MG tablet Take 1 tablet (25 mg total) by mouth daily. 90 tablet  4   azelastine (ASTELIN) 0.1 % nasal spray Place 2 sprays into both nostrils as needed.     EPINEPHrine 0.3 mg/0.3 mL IJ SOAJ injection as needed.     fluticasone (FLONASE) 50 MCG/ACT nasal spray Place 2 sprays into both nostrils as needed for allergies.     fluticasone (FLOVENT HFA) 44 MCG/ACT inhaler Inhale 1 puff into the lungs daily.     ibuprofen (ADVIL) 600 MG tablet Take 1 tablet (600 mg total) by mouth every 6 (six) hours as needed. 30 tablet 0   methadone (DOLOPHINE) 5 MG tablet Take 5 mg by mouth daily.     neomycin-polymyxin-hydrocortisone (CORTISPORIN) OTIC solution Place 3 drops into the left ear 3 (three) times daily. 10 mL 0   ondansetron (ZOFRAN ODT) 4 MG disintegrating tablet Take 1 tablet (4 mg total) by mouth every 8 (eight) hours as needed for nausea or vomiting. 20 tablet 2   pramipexole (MIRAPEX) 0.5 MG tablet Take 1 tablet by mouth in the morning and at bedtime. 4 pm and 9pm daily      SUMAtriptan (IMITREX) 100 MG tablet TAKE 1 TABLET BY MOUTH AT ONSET OF HEADACHE. MAY TAKE AN ADDITIONAL TABLET IN 2 HOURS IF NEEDED. MAX OF 200MG  IN 24 HOURS 9 tablet 2   triamcinolone cream (KENALOG) 0.1 % Apply 1 Application topically 2 (two) times daily. 15 g 0   Vitamin D, Ergocalciferol, (DRISDOL) 1.25 MG (50000 UNIT) CAPS capsule Take 1 capsule (50,000 Units total) by mouth once a week. 12 capsule 4   Fexofenadine HCl (ALLEGRA PO) Take 1 capsule by mouth daily. (Patient not taking: Reported on 11/01/2022)     montelukast (SINGULAIR) 10 MG tablet Take 10 mg by mouth at bedtime.  (Patient not taking: Reported on 11/01/2022)     omeprazole (PRILOSEC) 40 MG capsule Take 40 mg by mouth daily. (Patient not taking: Reported on 11/01/2022)     No current facility-administered medications for this visit.    OBJECTIVE: There were no vitals filed for this visit.    There is no height or weight on file to calculate BMI.    ECOG FS:0 - Asymptomatic  General: Well-developed, well-nourished, no acute distress. HEENT: Normocephalic. Neuro: Alert, answering all questions appropriately. Cranial nerves grossly intact. Psych: Normal affect.   LAB RESULTS:  Lab Results  Component Value Date   NA 140 10/31/2022   K 3.7 10/31/2022   CL 109 10/31/2022   CO2 23 10/31/2022   GLUCOSE 104 (H) 10/31/2022   BUN 21 10/31/2022   CREATININE 0.73 10/31/2022   CALCIUM 9.1 10/31/2022   PROT 6.5 10/31/2022   ALBUMIN 3.9 10/31/2022   AST 22 10/31/2022   ALT 14 10/31/2022   ALKPHOS 67 10/31/2022   BILITOT 0.4 10/31/2022   GFRNONAA >60 10/31/2022   GFRAA 99 06/04/2020    Lab Results  Component Value Date   WBC 8.2 10/31/2022   NEUTROABS 4.2 10/31/2022   HGB 12.2 10/31/2022   HCT 35.5 (L) 10/31/2022   MCV 93.2 10/31/2022   PLT 180 10/31/2022   Lab Results  Component Value Date   IRON 70 10/31/2022   TIBC 279 10/31/2022   IRONPCTSAT 25 10/31/2022   Lab Results  Component Value Date   FERRITIN 36  10/31/2022     STUDIES: DG Chest Port 1 View  Result Date: 10/22/2022 CLINICAL DATA:  Pain EXAM: PORTABLE CHEST 1 VIEW COMPARISON:  07/12/2022 FINDINGS: Right shoulder replacement. No acute airspace disease or effusion. Stable cardiomediastinal silhouette. No  pneumothorax. IMPRESSION: No active disease. Electronically Signed   By: Jasmine Pang M.D.   On: 10/22/2022 02:03   DG Lumbar Spine Complete  Result Date: 10/22/2022 CLINICAL DATA:  Back pain EXAM: LUMBAR SPINE - COMPLETE 4+ VIEW COMPARISON:  CT 07/12/2022 FINDINGS: Grade 1 anterolisthesis L4 on L5. Chronic compression deformities T12 and L1. Chronic bilateral pars defect at L5. Remaining vertebra demonstrate normal stature. Facet degenerative changes of the lower lumbar spine. IMPRESSION: Chronic compression deformities at T12 and L1. Grade 1 anterolisthesis L4 on L5. Chronic bilateral pars defect at L5. Electronically Signed   By: Jasmine Pang M.D.   On: 10/22/2022 02:01   DG Wrist Complete Right  Result Date: 10/22/2022 CLINICAL DATA:  Fall wrist pain EXAM: RIGHT WRIST - COMPLETE 3+ VIEW COMPARISON:  None Available. FINDINGS: Small dorsal fracture fragment consistent with triquetral fracture. Diffuse wrist swelling. No malalignment. Mild chondrocalcinosis. Moderate arthritis at the STT interval and mild arthritis at the first [facility] joint IMPRESSION: 1. Findings suspicious for triquetral fracture 2. Chondrocalcinosis. 3. Arthritis at the base of the thumb. Electronically Signed   By: Jasmine Pang M.D.   On: 10/22/2022 01:58    ASSESSMENT: [name].  PLAN:    [name]: Patient is homozygous.  Ferritin levels, iron saturation, and total iron all continue to be within normal limits.  She does not require phlebotomy at this time.  Patient has not required phlebotomy in nearly 18 months.  After lengthy discussion with the patient, is agreed upon that no further follow-up is necessary.  Recommend monitoring  patient's hemoglobin and ferritin 1-2 times per year and if ferritin trends up over 100 refer patient back for additional phlebotomy.  Restless leg syndrome: Significantly improved with low-dose methadone.  Continue monitoring and treatment per neurology.   Thrombocytopenia: Resolved. Heartburn: Patient does not complain of this today. Leukocytosis: Resolved. Fractured wrist: Follow-up with orthopedics as recommended.  I provided 20 minutes of face-to-face video visit time during this encounter which included chart review, counseling, and coordination of care as documented above.    Patient expressed understanding and was in agreement with this plan. She also understands that She can call clinic at any time with any questions, concerns, or complaints.    Jeralyn Ruths, MD   11/01/2022 3:51 PM

## 2022-11-03 ENCOUNTER — Ambulatory Visit (INDEPENDENT_AMBULATORY_CARE_PROVIDER_SITE_OTHER): Payer: Medicare HMO | Admitting: Physician Assistant

## 2022-11-03 ENCOUNTER — Encounter: Payer: Self-pay | Admitting: Physician Assistant

## 2022-11-03 ENCOUNTER — Ambulatory Visit: Payer: Self-pay

## 2022-11-03 DIAGNOSIS — J0110 Acute frontal sinusitis, unspecified: Secondary | ICD-10-CM | POA: Diagnosis not present

## 2022-11-03 LAB — POC COVID19 BINAXNOW: SARS Coronavirus 2 Ag: NEGATIVE

## 2022-11-03 MED ORDER — MONTELUKAST SODIUM 10 MG PO TABS: 10.0000 mg | ORAL_TABLET | Freq: Every day | ORAL | 0 refills | Status: DC

## 2022-11-03 MED ORDER — DOXYCYCLINE HYCLATE 100 MG PO TABS
100.0000 mg | ORAL_TABLET | Freq: Two times a day (BID) | ORAL | 0 refills | Status: AC
Start: 2022-11-03 — End: 2022-11-10

## 2022-11-03 NOTE — Progress Notes (Signed)
Established patient visit   Patient: Heidi Hernandez   DOB: 17-Jul-1950   [name]  MRN: [id number] Visit Date: 11/03/2022  Today's healthcare provider: Alfredia Ferguson, PA-C   Chief Complaint  Patient presents with   Cough    Pt stated--losing  voice, coughing w/ thick green mucus, sinus pressure, headache-2 days. Tried nasal spray prescribe by allergy shot.   Subjective    HPI  Pt reports headache, cough, losing her voice x 2 days. She has been using nasal sprays. Coughing up a light green mucous. Reports she does not have a spleen and infections worsen quickly for her.  Medications: Outpatient Medications Prior to Visit  Medication Sig   albuterol (VENTOLIN HFA) 108 (90 Base) MCG/ACT inhaler Inhale 2 puffs into the lungs every 6 (six) hours as needed for wheezing. May substitute generic or equivalent brand name   alendronate (FOSAMAX) 70 MG tablet TAKE 1 TABLET BY MOUTH ONCE WEEKLY ON AN EMPTY STOMACH BEFORE BREAKFAST. REMAIN UPRIGHT FOR 30 MINUTES AND TAKE WITH 8 OUNCES OF WATER   amLODipine (NORVASC) 10 MG tablet Take 1 tablet (10 mg total) by mouth daily.   atenolol (TENORMIN) 25 MG tablet Take 1 tablet (25 mg total) by mouth daily.   azelastine (ASTELIN) 0.1 % nasal spray Place 2 sprays into both nostrils as needed.   Fexofenadine HCl (ALLEGRA PO) Take 1 capsule by mouth daily.   fluticasone (FLONASE) 50 MCG/ACT nasal spray Place 2 sprays into both nostrils as needed for allergies.   fluticasone (FLOVENT HFA) 44 MCG/ACT inhaler Inhale 1 puff into the lungs daily.   methadone (DOLOPHINE) 5 MG tablet Take 5 mg by mouth daily.   ondansetron (ZOFRAN ODT) 4 MG disintegrating tablet Take 1 tablet (4 mg total) by mouth every 8 (eight) hours as needed for nausea or vomiting.   pramipexole (MIRAPEX) 0.5 MG tablet Take 1 tablet by mouth in the morning and at bedtime. 4 pm and 9pm daily   SUMAtriptan (IMITREX) 100 MG tablet TAKE 1 TABLET BY MOUTH AT ONSET OF HEADACHE. MAY TAKE  AN ADDITIONAL TABLET IN 2 HOURS IF NEEDED. MAX OF 200MG  IN 24 HOURS   triamcinolone cream (KENALOG) 0.1 % Apply 1 Application topically 2 (two) times daily.   Vitamin D, Ergocalciferol, (DRISDOL) 1.25 MG (50000 UNIT) CAPS capsule Take 1 capsule (50,000 Units total) by mouth once a week.   EPINEPHrine 0.3 mg/0.3 mL IJ SOAJ injection as needed. (Patient not taking: Reported on 11/03/2022)   [DISCONTINUED] ibuprofen (ADVIL) 600 MG tablet Take 1 tablet (600 mg total) by mouth every 6 (six) hours as needed.   [DISCONTINUED] montelukast (SINGULAIR) 10 MG tablet Take 10 mg by mouth at bedtime.  (Patient not taking: Reported on 11/01/2022)   [DISCONTINUED] neomycin-polymyxin-hydrocortisone (CORTISPORIN) OTIC solution Place 3 drops into the left ear 3 (three) times daily. (Patient not taking: Reported on 11/03/2022)   [DISCONTINUED] omeprazole (PRILOSEC) 40 MG capsule Take 40 mg by mouth daily. (Patient not taking: Reported on 11/03/2022)   No facility-administered medications prior to visit.    Review of Systems  Constitutional:  Positive for fatigue. Negative for fever.  HENT:  Positive for congestion, rhinorrhea, sinus pressure and sinus pain.   Respiratory:  Positive for cough. Negative for shortness of breath.   Cardiovascular:  Negative for chest pain and leg swelling.  Gastrointestinal:  Negative for abdominal pain.  Neurological:  Negative for dizziness and headaches.       Objective  BP (!) 144/73   Pulse (!) 58   Temp 98.3 F (36.8 C)   Ht 5\' 4"  (1.626 m)   Wt 117 lb (53.1 kg)   SpO2 98%   BMI 20.08 kg/m  Blood pressure (!) 144/73, pulse (!) 58, temperature 98.3 F (36.8 C), height 5\' 4"  (1.626 m), weight 117 lb (53.1 kg), SpO2 98 %.   Physical Exam Constitutional:      General: She is awake.     Appearance: She is well-developed.  HENT:     Head: Normocephalic.     Right Ear: Tympanic membrane normal.     Left Ear: Tympanic membrane normal.  Eyes:     Conjunctiva/sclera:  Conjunctivae normal.  Cardiovascular:     Rate and Rhythm: Normal rate and regular rhythm.     Heart sounds: Normal heart sounds.  Pulmonary:     Effort: Pulmonary effort is normal.     Breath sounds: Normal breath sounds.  Skin:    General: Skin is warm.  Neurological:     Mental Status: She is alert and oriented to person, place, and time.  Psychiatric:        Attention and Perception: Attention normal.        Mood and Affect: Mood normal.        Speech: Speech normal.        Behavior: Behavior is cooperative.     Results for orders placed or performed in visit on 11/03/22  POC COVID-19  Result Value Ref Range   SARS Coronavirus 2 Ag Negative Negative    Assessment & Plan    1. Acute non-recurrent frontal sinusitis Refilled singular, at least for duration of symptoms Given h/o of sinusitis usually being bacteria, and pt is asplenic, rx doxycyline.  Poc covid negative Encouraged fluids, nasal sprays, tylneol.  - montelukast (SINGULAIR) 10 MG tablet; Take 1 tablet (10 mg total) by mouth at bedtime.  Dispense: 90 tablet; Refill: 0 - doxycycline (VIBRA-TABS) 100 MG tablet; Take 1 tablet (100 mg total) by mouth 2 (two) times daily for 7 days.  Dispense: 14 tablet; Refill: 0 - POC COVID-19   Return if symptoms worsen or fail to improve.     I, Alfredia Ferguson, PA-C have reviewed all documentation for this visit. The documentation on  11/03/22   for the exam, diagnosis, procedures, and orders are all accurate and complete.  Alfredia Ferguson, PA-C [facility] [address] [place], [place], [address] Office: [phone] Fax: [phone]   [facility]

## 2022-11-03 NOTE — Telephone Encounter (Signed)
  Chief Complaint: Cough laryngitis Symptoms: above Frequency: a few days Pertinent Negatives: Patient denies  Disposition: [] ED /[] Urgent Care (no appt availability in office) / [x] Appointment(In office/virtual)/ []  [facility]/ [] Home Care/ [] Refused Recommended Disposition /[] [facility]/ []  Follow-up with PCP Additional Notes: Pt called wanting abx called in. Pt is very hoarse, and had a cough. Pt has no spleen and is worried that this is becoming bronchitis.    Summary: bronchitis?   Pt states that she has laryngitis and it is going into bronchitis and she is wanting her PCP to call her in some medication. Pt states her PCP know how bad she gets with bronchitis and she does not have a spleen. Please advise.     Reason for Disposition  Fever present > 3 days (72 hours)  Answer Assessment - Initial Assessment Questions 1. DESCRIPTION: "Describe your voice." (e.g., coarse, raspy, weaker, airy, scratchy, deeper)     raspy 2. SEVERITY: "How bad is it?"   - MILD: doesn't interfere with normal activities   - MODERATE: interferes with normal activities such as school or work   - SEVERE: only able to whisper.     moderate 3. ONSET: "When did the hoarseness begin?"     A few days ago 4. COUGH: "Is there a cough?" If Yes, ask: "How bad is it?"     Yes - must worse. 5. FEVER: "Do you have a fever?" If Yes, ask: "What is your temperature, how was it measured, and when did it start?"      6. ALLERGIES: "Any allergy symptoms?" If Yes, ask: "What are they?"      7. IRRITANTS: "Do you smoke?" "Have you been exposed to any irritating fumes?" (e.g., smoke)      8. CAUSE: "What do you think is causing the hoarseness?"     Bronchitis 9. OTHER SYMPTOMS: "Do you have any other symptoms?" (e.g., breathing difficulty, fever, foreign body, lymph node swelling in neck, rash, sore throat, weight loss)     Cough  Protocols used: Hoarseness-A-AH

## 2022-11-03 NOTE — Patient Instructions (Signed)
Debrox (for ear wax)

## 2022-11-25 DIAGNOSIS — M65322 Trigger finger, left index finger: Secondary | ICD-10-CM | POA: Diagnosis not present

## 2022-11-25 DIAGNOSIS — S63511D Sprain of carpal joint of right wrist, subsequent encounter: Secondary | ICD-10-CM | POA: Diagnosis not present

## 2022-11-25 DIAGNOSIS — M65312 Trigger thumb, left thumb: Secondary | ICD-10-CM | POA: Diagnosis not present

## 2022-12-12 ENCOUNTER — Other Ambulatory Visit: Payer: Self-pay | Admitting: *Deleted

## 2022-12-12 ENCOUNTER — Telehealth: Payer: Self-pay | Admitting: Family Medicine

## 2022-12-12 DIAGNOSIS — M810 Age-related osteoporosis without current pathological fracture: Secondary | ICD-10-CM

## 2022-12-12 MED ORDER — ALENDRONATE SODIUM 70 MG PO TABS
ORAL_TABLET | ORAL | 1 refills | Status: DC
Start: 2022-12-12 — End: 2023-01-05

## 2022-12-12 NOTE — Telephone Encounter (Signed)
[facility] faxed refill request for the following medications:   alendronate (FOSAMAX) 70 MG tablet    Please advise

## 2022-12-12 NOTE — Telephone Encounter (Signed)
Sent -Public house manager.

## 2022-12-14 ENCOUNTER — Other Ambulatory Visit: Payer: Self-pay | Admitting: Family Medicine

## 2022-12-14 DIAGNOSIS — I10 Essential (primary) hypertension: Secondary | ICD-10-CM

## 2022-12-14 NOTE — Telephone Encounter (Signed)
Requested Prescriptions  Pending Prescriptions Disp Refills   atenolol (TENORMIN) 25 MG tablet [Pharmacy Med Name: ATENOLOL 25 MG TABLET] 90 tablet 0    Sig: TAKE ONE TABLET BY MOUTH DAILY     Cardiovascular: Beta Blockers 2 Failed - 12/14/2022  6:22 AM      Failed - Last BP in normal range    BP Readings from Last 1 Encounters:  11/03/22 (!) 144/73         Passed - Cr in normal range and within 360 days    Creatinine, Ser  Date Value Ref Range Status  10/31/2022 0.73 0.44 - 1.00 mg/dL Final         Passed - Last Heart Rate in normal range    Pulse Readings from Last 1 Encounters:  11/03/22 (!) 58         Passed - Valid encounter within last 6 months    Recent Outpatient Visits           1 month ago Acute non-recurrent frontal sinusitis   [facility] Alfredia Ferguson, PA-C   2 months ago Dysuria   [facility] Malva Limes, MD   3 months ago Acute otitis externa of left ear, unspecified type   [facility] Alfredia Ferguson, PA-C   4 months ago COVID-19   [facility] Sherrie Mustache, Demetrios Isaacs, MD   7 months ago Nocturia   [facility] Malva Limes, MD

## 2022-12-31 ENCOUNTER — Other Ambulatory Visit: Payer: Self-pay | Admitting: Family Medicine

## 2022-12-31 DIAGNOSIS — I10 Essential (primary) hypertension: Secondary | ICD-10-CM

## 2023-01-02 NOTE — Telephone Encounter (Signed)
Requested Prescriptions  Pending Prescriptions Disp Refills   amLODipine (NORVASC) 10 MG tablet [Pharmacy Med Name: AMLODIPINE BESYLATE 10 MG TAB] 90 tablet 0    Sig: TAKE ONE TABLET BY MOUTH DAILY     Cardiovascular: Calcium Channel Blockers 2 Failed - 12/31/2022  6:52 AM      Failed - Last BP in normal range    BP Readings from Last 1 Encounters:  11/03/22 (!) 144/73         Passed - Last Heart Rate in normal range    Pulse Readings from Last 1 Encounters:  11/03/22 (!) 58         Passed - Valid encounter within last 6 months    Recent Outpatient Visits           2 months ago Acute non-recurrent frontal sinusitis    [facility] Alfredia Ferguson, PA-C   3 months ago Dysuria   [facility] Malva Limes, MD   4 months ago Acute otitis externa of left ear, unspecified type   [facility] Alfredia Ferguson, PA-C   4 months ago COVID-19   [facility] Demetrios Isaacs, MD   8 months ago Nocturia   [facility] Malva Limes, MD

## 2023-01-04 ENCOUNTER — Ambulatory Visit: Payer: Self-pay

## 2023-01-04 NOTE — Telephone Encounter (Addendum)
  Chief Complaint: numbness and tingling to both feet and toes  Symptoms: feels like sx are going up legs and sx come and go, had headaceh not during triage call  Frequency: 1 month Pertinent Negatives: Patient denies dizziness, vision loss, double vision, changes in speech, unsteady on your feet Disposition: [] ED /[] Urgent Care (no appt availability in office) / [] Appointment(In office/virtual)/ []  [facility]/ [] Home Care/ [x] Refused Recommended Disposition /[] [facility]/ []  Follow-up with PCP Additional Notes: pt does not want to see anyone but Dr. Sherrie Mustache did offer appts with PA and NP. Pt refused. Pt stated she has been outside a lot working out in her garden. Pt stated she is drinking water.  Reason for Disposition  [1] Numbness or tingling in one or both feet AND [2] is a chronic symptom (recurrent or ongoing AND present > 4 weeks)  Answer Assessment - Initial Assessment Questions 1. SYMPTOM: "What is the main symptom you are concerned about?" (e.g., weakness, numbness)     Numbness and tingling  2. ONSET: "When did this start?" (minutes, hours, days; while sleeping)     1 month  4. PATTERN "Does this come and go, or has it been constant since it started?"  "Is it present now?"     Comes and goes  5. CARDIAC SYMPTOMS: "Have you had any of the following symptoms: chest pain, difficulty breathing, palpitations?"     no 6. NEUROLOGIC SYMPTOMS: "Have you had any of the following symptoms: headache, dizziness, vision loss, double vision, changes in speech, unsteady on your feet?"     Headache  7. OTHER SYMPTOMS: "Do you have any other symptoms?"     Started  Protocols used: Neurologic Deficit-A-AH

## 2023-01-05 ENCOUNTER — Other Ambulatory Visit: Payer: Self-pay | Admitting: Family Medicine

## 2023-01-05 DIAGNOSIS — M810 Age-related osteoporosis without current pathological fracture: Secondary | ICD-10-CM

## 2023-01-05 NOTE — Telephone Encounter (Signed)
Patient scheduled with Dr.Fisher at 11:20 tomorrw. Advised he would not be in office today and I could get her scheduled with someone else but she only wants to see Dr.Fisher. Advised if symptoms become worse before appointment tomorrow she would need to go to ER

## 2023-01-06 ENCOUNTER — Ambulatory Visit (INDEPENDENT_AMBULATORY_CARE_PROVIDER_SITE_OTHER): Payer: Medicare HMO | Admitting: Family Medicine

## 2023-01-06 ENCOUNTER — Encounter: Payer: Self-pay | Admitting: Family Medicine

## 2023-01-06 VITALS — BP 125/60 | HR 64 | Temp 97.6°F | Resp 12 | Ht 64.0 in | Wt 115.3 lb

## 2023-01-06 DIAGNOSIS — G629 Polyneuropathy, unspecified: Secondary | ICD-10-CM

## 2023-01-06 DIAGNOSIS — M810 Age-related osteoporosis without current pathological fracture: Secondary | ICD-10-CM | POA: Diagnosis not present

## 2023-01-06 DIAGNOSIS — E559 Vitamin D deficiency, unspecified: Secondary | ICD-10-CM

## 2023-01-06 DIAGNOSIS — E538 Deficiency of other specified B group vitamins: Secondary | ICD-10-CM | POA: Diagnosis not present

## 2023-01-06 NOTE — Progress Notes (Signed)
Established patient visit   Patient: Heidi Hernandez   DOB: [birth year]   [name]  MRN: [id number] Visit Date: 01/06/2023  Today's healthcare provider: [name], MD    Subjective    Discussed the use of AI scribe software for clinical note transcription with the patient, who gave verbal consent to proceed.  History of Present Illness   The patient, with a history of arthritis and restless leg syndrome managed with methadone, presents with a month-long history of bilateral lower extremity numbness. The numbness, initially localized to the toes, has progressively ascended to involve the feet and legs. The patient describes the sensation as constant, akin to the 'tingling' experienced when a limb 'falls asleep.' The numbness is more pronounced posteriorly and persists regardless of the patient's position or activity level.  In addition to the lower extremity symptoms, the patient reports recent onset of numbness in the fingertips, which she attributes to her arthritis. The patient denies any associated pain or discomfort in the affected areas.  The patient also reports significant weight loss following an esophageal surgery performed earlier in the year. Despite the weight loss, she has been physically active, spending considerable time gardening.  The patient's medication regimen includes vitamin D supplementation. She is also noted to have had borderline low B12 of 372 in November 2023. She continues to take methadone for restless leg syndrome which is prescribed by her neurologist, noting that symptoms recur if the medication is missed.  The patient has a history of immune thrombocytopenic purpura (ITP) and has undergone splenectomy in the past. She expresses concern about the possibility of an underlying autoimmune condition contributing to her current symptoms.       Medications: Outpatient Medications Prior to Visit  Medication Sig   albuterol (VENTOLIN HFA) 108 (90  Base) MCG/ACT inhaler Inhale 2 puffs into the lungs every 6 (six) hours as needed for wheezing. May substitute generic or equivalent brand name   alendronate (FOSAMAX) 70 MG tablet TAKE 1 TABLET BY MOUTH ONCE WEEKLY ON EMTPY STOMACH BEFORE BREAKFAST. REMAIN UPRIGHT FOR 30 MINUTES AND TAKE WITH 8 OUNCES OF WATER   amLODipine (NORVASC) 10 MG tablet TAKE ONE TABLET BY MOUTH DAILY   atenolol (TENORMIN) 25 MG tablet TAKE ONE TABLET BY MOUTH DAILY   azelastine (ASTELIN) 0.1 % nasal spray Place 2 sprays into both nostrils as needed.   Fexofenadine HCl (ALLEGRA PO) Take 1 capsule by mouth daily.   fluticasone (FLONASE) 50 MCG/ACT nasal spray Place 2 sprays into both nostrils as needed for allergies.   fluticasone (FLOVENT HFA) 44 MCG/ACT inhaler Inhale 1 puff into the lungs daily.   methadone (DOLOPHINE) 5 MG tablet Take 5 mg by mouth daily.   montelukast (SINGULAIR) 10 MG tablet Take 1 tablet (10 mg total) by mouth at bedtime.   ondansetron (ZOFRAN ODT) 4 MG disintegrating tablet Take 1 tablet (4 mg total) by mouth every 8 (eight) hours as needed for nausea or vomiting.   pramipexole (MIRAPEX) 0.5 MG tablet Take 1 tablet by mouth in the morning and at bedtime. 4 pm and 9pm daily   SUMAtriptan (IMITREX) 100 MG tablet TAKE 1 TABLET BY MOUTH AT ONSET OF HEADACHE. MAY TAKE AN ADDITIONAL TABLET IN 2 HOURS IF NEEDED. MAX OF 200MG  IN 24 HOURS   triamcinolone cream (KENALOG) 0.1 % Apply 1 Application topically 2 (two) times daily.   Vitamin D, Ergocalciferol, (DRISDOL) 1.25 MG (50000 UNIT) CAPS capsule Take 1 capsule (50,000 Units total)  by mouth once a week.   EPINEPHrine 0.3 mg/0.3 mL IJ SOAJ injection as needed. (Patient not taking: Reported on 01/06/2023)   No facility-administered medications prior to visit.       Objective    BP 125/60 (BP Location: Left Arm, Patient Position: Sitting, Cuff Size: Normal)   Pulse 64   Temp 97.6 F (36.4 C) (Temporal)   Resp 12   Ht 5\' 4"  (1.626 m)   Wt 115 lb 4.8  oz (52.3 kg)   SpO2 99%   BMI 19.79 kg/m    Physical Exam   General: Appearance:    Thin female in no acute distress  Eyes:    PERRL, conjunctiva/corneas clear, EOM's intact       Lungs:     Clear to auscultation bilaterally, respirations unlabored  Heart:    Normal heart rate. Normal rhythm.  1/6 systolic murmur at right upper sternal border 1+ bilateral pedal pulses. Diminished s/s both feet. Cap refill < 1 second   MS:   All extremities are intact.    Neurologic:   Awake, alert, oriented x 3. No apparent focal neurological defect.         Assessment & Plan     Assessment and Plan    Peripheral Neuropathy: New onset of bilateral numbness in feet and legs, and tips of fingers. No associated pain. Symptoms are constant and do not change with position or activity. Patient has a history of arthritis, borderline B12 deficiency, and ITP, but no diabetes. -Order blood work to check for nutritional deficiencies (B vitamins, Vitamin D) and autoimmune conditions (ANA). -Consider evaluation by her neurologist if no clear cause is identified from blood work.   Arthritis: Chronic condition, with recent increase in numbness in fingertips. -Continue current management plan.  Restless Leg Syndrome: Controlled with Methadone 5mg . -Continue current management plan per neurology.  Weight Loss: Significant weight loss since esophageal surgery in February. -Consider nutritional consultation if blood work shows deficiencies.  ITP: Chronic condition, managed by a specialist. -Continue current management plan.  Follow-up: Await results of blood work, likely available by Tuesday. Patient to contact office for results.           [name], MD  [facility] [phone] (phone) [phone] (fax)  [facility]

## 2023-01-09 DIAGNOSIS — Z961 Presence of intraocular lens: Secondary | ICD-10-CM | POA: Diagnosis not present

## 2023-01-09 LAB — VITAMIN B12: Vitamin B-12: 271 pg/mL (ref 232–1245)

## 2023-01-09 LAB — ANA W/REFLEX IF POSITIVE: Anti Nuclear Antibody (ANA): NEGATIVE

## 2023-01-09 LAB — T4, FREE: Free T4: 1.11 ng/dL (ref 0.82–1.77)

## 2023-01-09 LAB — MAGNESIUM: Magnesium: 2 mg/dL (ref 1.6–2.3)

## 2023-01-09 LAB — TSH: TSH: 1.33 u[IU]/mL (ref 0.450–4.500)

## 2023-01-09 LAB — VITAMIN D 25 HYDROXY (VIT D DEFICIENCY, FRACTURES): Vit D, 25-Hydroxy: 41.1 ng/mL (ref 30.0–100.0)

## 2023-01-27 DIAGNOSIS — I10 Essential (primary) hypertension: Secondary | ICD-10-CM | POA: Diagnosis not present

## 2023-01-27 DIAGNOSIS — I341 Nonrheumatic mitral (valve) prolapse: Secondary | ICD-10-CM | POA: Diagnosis not present

## 2023-01-27 DIAGNOSIS — I77810 Thoracic aortic ectasia: Secondary | ICD-10-CM | POA: Diagnosis not present

## 2023-01-30 DIAGNOSIS — Z8719 Personal history of other diseases of the digestive system: Secondary | ICD-10-CM | POA: Diagnosis not present

## 2023-01-30 DIAGNOSIS — R634 Abnormal weight loss: Secondary | ICD-10-CM | POA: Diagnosis not present

## 2023-01-30 DIAGNOSIS — K2270 Barrett's esophagus without dysplasia: Secondary | ICD-10-CM | POA: Diagnosis not present

## 2023-01-30 DIAGNOSIS — Z9889 Other specified postprocedural states: Secondary | ICD-10-CM | POA: Diagnosis not present

## 2023-01-31 ENCOUNTER — Ambulatory Visit (INDEPENDENT_AMBULATORY_CARE_PROVIDER_SITE_OTHER): Payer: Medicare HMO | Admitting: Family Medicine

## 2023-01-31 ENCOUNTER — Ambulatory Visit: Payer: Self-pay | Admitting: *Deleted

## 2023-01-31 VITALS — BP 143/66 | HR 61 | Temp 97.0°F | Resp 16 | Ht 64.0 in | Wt 116.0 lb

## 2023-01-31 DIAGNOSIS — R300 Dysuria: Secondary | ICD-10-CM | POA: Diagnosis not present

## 2023-01-31 DIAGNOSIS — N3000 Acute cystitis without hematuria: Secondary | ICD-10-CM

## 2023-01-31 LAB — POCT URINALYSIS DIPSTICK
Bilirubin, UA: NEGATIVE
Glucose, UA: NEGATIVE
Ketones, UA: NEGATIVE
Nitrite, UA: NEGATIVE
Protein, UA: NEGATIVE
Spec Grav, UA: 1.015 (ref 1.010–1.025)
Urobilinogen, UA: 0.2 E.U./dL
pH, UA: 6 (ref 5.0–8.0)

## 2023-01-31 MED ORDER — CEPHALEXIN 500 MG PO CAPS
500.0000 mg | ORAL_CAPSULE | Freq: Two times a day (BID) | ORAL | 0 refills | Status: AC
Start: 2023-01-31 — End: 2023-02-07

## 2023-01-31 NOTE — Progress Notes (Signed)
Established patient visit   Patient: Heidi Hernandez   DOB: 02/20/1951   [name]  MRN: [id number] Visit Date: 01/31/2023  Today's healthcare provider: [name], MD   Chief Complaint  Patient presents with   Urinary Tract Infection   Subjective    HPI HPI   Patient states that she has been having urinary frequency and dysuria for several days.  She has started drinking cranberry juice in an effort to help  Last edited by Adline Peals, CMA on 01/31/2023  9:40 AM.      Same as sx in April when urine cx grew >100k e. Coli. Quickly resoved with cephalexin at that time.   Medications: Outpatient Medications Prior to Visit  Medication Sig   albuterol (VENTOLIN HFA) 108 (90 Base) MCG/ACT inhaler Inhale 2 puffs into the lungs every 6 (six) hours as needed for wheezing. May substitute generic or equivalent brand name   alendronate (FOSAMAX) 70 MG tablet TAKE 1 TABLET BY MOUTH ONCE WEEKLY ON EMTPY STOMACH BEFORE BREAKFAST. REMAIN UPRIGHT FOR 30 MINUTES AND TAKE WITH 8 OUNCES OF WATER   amLODipine (NORVASC) 10 MG tablet TAKE ONE TABLET BY MOUTH DAILY   atenolol (TENORMIN) 25 MG tablet TAKE ONE TABLET BY MOUTH DAILY   azelastine (ASTELIN) 0.1 % nasal spray Place 2 sprays into both nostrils as needed.   EPINEPHrine 0.3 mg/0.3 mL IJ SOAJ injection as needed.   Fexofenadine HCl (ALLEGRA PO) Take 1 capsule by mouth daily.   fluticasone (FLONASE) 50 MCG/ACT nasal spray Place 2 sprays into both nostrils as needed for allergies.   fluticasone (FLOVENT HFA) 44 MCG/ACT inhaler Inhale 1 puff into the lungs daily.   methadone (DOLOPHINE) 5 MG tablet Take 5 mg by mouth daily.   montelukast (SINGULAIR) 10 MG tablet Take 1 tablet (10 mg total) by mouth at bedtime.   ondansetron (ZOFRAN ODT) 4 MG disintegrating tablet Take 1 tablet (4 mg total) by mouth every 8 (eight) hours as needed for nausea or vomiting.   pramipexole (MIRAPEX) 0.5 MG tablet Take 1 tablet by mouth in the morning  and at bedtime. 4 pm and 9pm daily   SUMAtriptan (IMITREX) 100 MG tablet TAKE 1 TABLET BY MOUTH AT ONSET OF HEADACHE. MAY TAKE AN ADDITIONAL TABLET IN 2 HOURS IF NEEDED. MAX OF 200MG  IN 24 HOURS   triamcinolone cream (KENALOG) 0.1 % Apply 1 Application topically 2 (two) times daily.   Vitamin D, Ergocalciferol, (DRISDOL) 1.25 MG (50000 UNIT) CAPS capsule Take 1 capsule (50,000 Units total) by mouth once a week.   No facility-administered medications prior to visit.       Objective    BP (!) 143/66   Pulse 61   Temp (!) 97 F (36.1 C) (Oral)   Resp 16   Ht 5\' 4"  (1.626 m)   Wt 116 lb (52.6 kg)   BMI 19.91 kg/m    Physical Exam  General appearance: Thin female, cooperative and in no acute distress Head: Normocephalic, without obvious abnormality, atraumatic Respiratory: Respirations even and unlabored, normal respiratory rate Extremities: All extremities are intact.  Skin: Skin color, texture, turgor normal. No rashes seen  Psych: Appropriate mood and affect. Neurologic: Mental status: Alert, oriented to person, place, and time, thought content appropriate.   Results for orders placed or performed in visit on 01/31/23  POCT urinalysis dipstick  Result Value Ref Range   Color, UA yellow    Clarity, UA cloudy    Glucose,  UA Negative Negative   Bilirubin, UA neg    Ketones, UA neg    Spec Grav, UA 1.015 1.010 - 1.025   Blood, UA trace    pH, UA 6.0 5.0 - 8.0   Protein, UA Negative Negative   Urobilinogen, UA 0.2 0.2 or 1.0 E.U./dL   Nitrite, UA neg    Leukocytes, UA Small (1+) (A) Negative   Appearance     Odor      Assessment & Plan     1. Dysuria   2. Acute cystitis without hematuria  - Urine Culture - cephALEXin (KEFLEX) 500 MG capsule; Take 1 capsule (500 mg total) by mouth 2 (two) times daily for 7 days.  Dispense: 14 capsule; Refill: 0   Second uti since April. Discussed increasing fluid intake and emptying bladder more frequency. Discussed considering  vaginal estrogen if infections continue to recur.     The entirety of the information documented in the History of Present Illness, Review of Systems and Physical Exam were personally obtained by me. Portions of this information were initially documented by the CMA and reviewed by me for thoroughness and accuracy.     [name], MD  [facility] [phone] (phone) [phone] (fax)  [facility]

## 2023-01-31 NOTE — Telephone Encounter (Signed)
Message from Lennox Pippins sent at 01/31/2023  8:04 AM EDT  Summary: Possible UTI symptoms   Patient called in regards to stating she is having a UTI again, it has been going on for several days. Frequent urination, burning & pressure. No abdominal pain, states she will need an antibiotic.  Patients callback @  [phone]          Call History  Contact Date/Time Type Contact Phone/Fax User  01/31/2023 08:03 AM EDT Phone (Incoming) Rheva, Ventrice (Self) [phone] [name]) Muck, Army Melia   Reason for Disposition  > 2 UTI's in last year  Answer Assessment - Initial Assessment Questions 1. SEVERITY: "How bad is the pain?"  (e.g., Scale 1-10; mild, moderate, or severe)   - MILD (1-3): complains slightly about urination hurting   - MODERATE (4-7): interferes with normal activities     - SEVERE (8-10): excruciating, unwilling or unable to urinate because of the pain      I'm having pressure, burning, frequency, no abd pain 2. FREQUENCY: "How many times have you had painful urination today?"      Yes 3. PATTERN: "Is pain present every time you urinate or just sometimes?"      Yes   4. ONSET: "When did the painful urination start?"      Several days.     5. FEVER: "Do you have a fever?" If Yes, ask: "What is your temperature, how was it measured, and when did it start?"     No 6. PAST UTI: "Have you had a urine infection before?" If Yes, ask: "When was the last time?" and "What happened that time?"      Yes    I have not felt well for the last several days I'm tired.    7. CAUSE: "What do you think is causing the painful urination?"  (e.g., UTI, scratch, Herpes sore)     UTI I've had it a couple of time during the last year. 8. OTHER SYMPTOMS: "Do you have any other symptoms?" (e.g., blood in urine, flank pain, genital sores, urgency, vaginal discharge)     No abd pain or blood.   It does have a different odor to my urine. 9. PREGNANCY: "Is there any chance you are pregnant?" "When was  your last menstrual period?"     N/A due to age  Protocols used: Urination Pain - Female-A-AH

## 2023-01-31 NOTE — Telephone Encounter (Signed)
  Chief Complaint: UTI symptoms Symptoms: Burning, frequency, pressure Frequency: For last several days.   Been trying cranberry juice Pertinent Negatives: Patient denies blood in urine or flank pain no fever Disposition: [] ED /[] Urgent Care (no appt availability in office) / [x] Appointment(In office/virtual)/ []  [place] [facility]/ [] Home Care/ [] Refused Recommended Disposition /[] [place] [facility]/ []  Follow-up with PCP Additional Notes: Appt made with Dr Sherrie Mustache for this morning at 9:40.

## 2023-02-02 ENCOUNTER — Other Ambulatory Visit: Payer: Self-pay | Admitting: Physician Assistant

## 2023-02-02 DIAGNOSIS — I7121 Aneurysm of the ascending aorta, without rupture: Secondary | ICD-10-CM

## 2023-02-06 ENCOUNTER — Ambulatory Visit
Admission: RE | Admit: 2023-02-06 | Discharge: 2023-02-06 | Disposition: A | Discharge status: Home / Self Care | Payer: Medicare HMO | Source: Ambulatory Visit | Attending: Physician Assistant | Admitting: Physician Assistant

## 2023-02-06 DIAGNOSIS — I77810 Thoracic aortic ectasia: Secondary | ICD-10-CM | POA: Diagnosis not present

## 2023-02-06 DIAGNOSIS — I7121 Aneurysm of the ascending aorta, without rupture: Secondary | ICD-10-CM | POA: Insufficient documentation

## 2023-02-06 DIAGNOSIS — I517 Cardiomegaly: Secondary | ICD-10-CM | POA: Diagnosis not present

## 2023-02-06 MED ORDER — IOHEXOL 350 MG/ML SOLN
75.0000 mL | Freq: Once | INTRAVENOUS | Status: AC | PRN
  Administered 2023-02-06: 75 mL via INTRAVENOUS

## 2023-03-12 ENCOUNTER — Other Ambulatory Visit: Payer: Self-pay | Admitting: Family Medicine

## 2023-03-12 DIAGNOSIS — I10 Essential (primary) hypertension: Secondary | ICD-10-CM

## 2023-04-01 ENCOUNTER — Other Ambulatory Visit: Payer: Self-pay | Admitting: Family Medicine

## 2023-04-01 DIAGNOSIS — I10 Essential (primary) hypertension: Secondary | ICD-10-CM

## 2023-04-03 NOTE — Telephone Encounter (Signed)
Requested Prescriptions  Pending Prescriptions Disp Refills   amLODipine (NORVASC) 10 MG tablet [Pharmacy Med Name: amLODIPine BESYLATE 10 MG TAB] 90 tablet 0    Sig: TAKE 1 TABLET BY MOUTH DAILY     Cardiovascular: Calcium Channel Blockers 2 Failed - 04/01/2023  6:53 AM      Failed - Last BP in normal range    BP Readings from Last 1 Encounters:  01/31/23 (!) 143/66         Passed - Last Heart Rate in normal range    Pulse Readings from Last 1 Encounters:  01/31/23 61         Passed - Valid encounter within last 6 months    Recent Outpatient Visits           2 months ago Dysuria   [facility] Malva Limes, MD   2 months ago Neuropathy   [facility] Malva Limes, MD   5 months ago Acute non-recurrent frontal sinusitis   [facility] Alfredia Ferguson, PA-C   6 months ago Dysuria   [facility] Malva Limes, MD   7 months ago Acute otitis externa of left ear, unspecified type   [facility] Alfredia Ferguson, PA-C       Future Appointments             In 1 week Fisher, Demetrios Isaacs, MD [facility]

## 2023-04-06 DIAGNOSIS — G2581 Restless legs syndrome: Secondary | ICD-10-CM | POA: Diagnosis not present

## 2023-04-12 ENCOUNTER — Ambulatory Visit: Payer: Medicare HMO | Admitting: Family Medicine

## 2023-04-12 ENCOUNTER — Encounter: Payer: Self-pay | Admitting: Family Medicine

## 2023-04-12 VITALS — BP 138/59 | HR 54 | Ht 64.0 in | Wt 118.3 lb

## 2023-04-12 DIAGNOSIS — E538 Deficiency of other specified B group vitamins: Secondary | ICD-10-CM | POA: Diagnosis not present

## 2023-04-12 DIAGNOSIS — G629 Polyneuropathy, unspecified: Secondary | ICD-10-CM | POA: Diagnosis not present

## 2023-04-12 DIAGNOSIS — R3989 Other symptoms and signs involving the genitourinary system: Secondary | ICD-10-CM | POA: Diagnosis not present

## 2023-04-12 DIAGNOSIS — E559 Vitamin D deficiency, unspecified: Secondary | ICD-10-CM | POA: Diagnosis not present

## 2023-04-12 DIAGNOSIS — Z23 Encounter for immunization: Secondary | ICD-10-CM | POA: Diagnosis not present

## 2023-04-12 DIAGNOSIS — R208 Other disturbances of skin sensation: Secondary | ICD-10-CM

## 2023-04-12 LAB — POCT URINALYSIS DIPSTICK
Bilirubin, UA: NEGATIVE
Blood, UA: NEGATIVE
Glucose, UA: NEGATIVE
Leukocytes, UA: NEGATIVE
Nitrite, UA: NEGATIVE
Protein, UA: POSITIVE — AB
Spec Grav, UA: 1.025 (ref 1.010–1.025)
Urobilinogen, UA: 0.2 U/dL
pH, UA: 6 (ref 5.0–8.0)

## 2023-04-12 MED ORDER — TERBINAFINE HCL 250 MG PO TABS
250.0000 mg | ORAL_TABLET | Freq: Every day | ORAL | 0 refills | Status: DC
Start: 2023-04-12 — End: 2023-05-12

## 2023-04-12 NOTE — Progress Notes (Signed)
Established patient visit   Patient: Heidi Hernandez   DOB: 1951/04/23   [name]  MRN: [id number] Visit Date: 04/12/2023  Today's healthcare provider: [name], MD   Chief Complaint  Patient presents with   Medical Management of Chronic Issues    3 month follow-up. Ptient reports she is still having some numbness in her feet. States it seems like it is a little bit better but still present.    Subjective    Discussed the use of AI scribe software for clinical note transcription with the patient, who gave verbal consent to proceed.  History of Present Illness   The patient presents with persistent numbness in her feet, which has been a concern since her last visit. She has been taking vitamin B12 and alpha lipoic acid for the past three months, as her B12 levels were found to be low during her summer visit. The patient reports a slight improvement in the numbness, which does not interfere with her sleep. The numbness is localized to the ends of her toes and does not affect her hands.  In addition to the numbness, the patient has noticed a thickening of her toenails, which has become more widespread since her last visit. She was previously prescribed terbinafine for a suspected fungal infection, but the treatment did not seem to help. The thickening has made it difficult for the patient to cut her nails.  The patient also reports feeling tired all the time, despite being active. She has been taking a multivitamin to help with her energy levels, but it does not seem to make a significant difference. The patient also reports a recent issue with her urine, which she managed to resolve by drinking cranberry juice.     Lab Results  Component Value Date   VITAMINB12 271 01/06/2023     Medications: Outpatient Medications Prior to Visit  Medication Sig   albuterol (VENTOLIN HFA) 108 (90 Base) MCG/ACT inhaler Inhale 2 puffs into the lungs every 6 (six) hours as needed for  wheezing. May substitute generic or equivalent brand name   alendronate (FOSAMAX) 70 MG tablet TAKE 1 TABLET BY MOUTH ONCE WEEKLY ON EMTPY STOMACH BEFORE BREAKFAST. REMAIN UPRIGHT FOR 30 MINUTES AND TAKE WITH 8 OUNCES OF WATER   amLODipine (NORVASC) 10 MG tablet TAKE 1 TABLET BY MOUTH DAILY   atenolol (TENORMIN) 25 MG tablet TAKE 1 TABLET BY MOUTH DAILY   azelastine (ASTELIN) 0.1 % nasal spray Place 2 sprays into both nostrils as needed.   EPINEPHrine 0.3 mg/0.3 mL IJ SOAJ injection as needed.   Fexofenadine HCl (ALLEGRA PO) Take 1 capsule by mouth daily.   fluticasone (FLONASE) 50 MCG/ACT nasal spray Place 2 sprays into both nostrils as needed for allergies.   fluticasone (FLOVENT HFA) 44 MCG/ACT inhaler Inhale 1 puff into the lungs daily.   methadone (DOLOPHINE) 5 MG tablet Take 5 mg by mouth daily.   montelukast (SINGULAIR) 10 MG tablet Take 1 tablet (10 mg total) by mouth at bedtime.   ondansetron (ZOFRAN ODT) 4 MG disintegrating tablet Take 1 tablet (4 mg total) by mouth every 8 (eight) hours as needed for nausea or vomiting.   pramipexole (MIRAPEX) 0.5 MG tablet Take 1 tablet by mouth in the morning and at bedtime. 4 pm and 9pm daily   SUMAtriptan (IMITREX) 100 MG tablet TAKE 1 TABLET BY MOUTH AT ONSET OF HEADACHE. MAY TAKE AN ADDITIONAL TABLET IN 2 HOURS IF NEEDED. MAX OF 200MG  IN  24 HOURS   triamcinolone cream (KENALOG) 0.1 % Apply 1 Application topically 2 (two) times daily.   Vitamin D, Ergocalciferol, (DRISDOL) 1.25 MG (50000 UNIT) CAPS capsule Take 1 capsule (50,000 Units total) by mouth once a week.   No facility-administered medications prior to visit.   Review of Systems     Objective    BP (!) 138/59 (BP Location: Left Arm, Patient Position: Sitting, Cuff Size: Normal)   Pulse (!) 54   Ht 5\' 4"  (1.626 m)   Wt 118 lb 4.8 oz (53.7 kg)   SpO2 98%   BMI 20.31 kg/m   Physical Exam  General appearance: Well developed, well nourished female, cooperative and in no acute  distress Head: Normocephalic, without obvious abnormality, atraumatic Respiratory: Respirations even and unlabored, normal respiratory rate Extremities: All extremities are intact.  Skin: Skin color, texture, turgor normal. No rashes seen  Psych: Appropriate mood and affect. Neurologic: Mental status: Alert, oriented to person, place, and time, thought content appropriate.     Results for orders placed or performed in visit on 04/12/23  POCT Urinalysis Dipstick  Result Value Ref Range   Color, UA     Clarity, UA     Glucose, UA Negative Negative   Bilirubin, UA negative    Ketones, UA moderate    Spec Grav, UA 1.025 1.010 - 1.025   Blood, UA negative    pH, UA 6.0 5.0 - 8.0   Protein, UA Positive (A) Negative   Urobilinogen, UA 0.2 0.2 or 1.0 E.U./dL   Nitrite, UA negative    Leukocytes, UA Negative Negative   Appearance     Odor      Assessment & Plan        Peripheral Neuropathy Numbness in feet, no associated pain or sleep disturbances. Patient has been on Vitamin B12 and alpha lipoic acid for three months. -Continue Vitamin B12 and alpha lipoic acid. -Check Vitamin B12 levels in November.  Onychomycosis Thickening of almost all toenails, previously treated with terbinafine in 2020 with limited improvement. -Start terbinafine daily, monitor liver function after one month. -Check liver function and Vitamin B12 levels in November.  -Physical scheduled for December 11th. -Complete all lab work in November, including cholesterol and sugar levels.    No follow-ups on file.      [name], MD  [facility] [phone] (phone) [phone] (fax)  [facility]

## 2023-05-10 ENCOUNTER — Telehealth: Payer: Self-pay | Admitting: Family Medicine

## 2023-05-10 ENCOUNTER — Other Ambulatory Visit: Payer: Self-pay | Admitting: Family Medicine

## 2023-05-10 DIAGNOSIS — E538 Deficiency of other specified B group vitamins: Secondary | ICD-10-CM

## 2023-05-10 DIAGNOSIS — I10 Essential (primary) hypertension: Secondary | ICD-10-CM

## 2023-05-10 DIAGNOSIS — Z79899 Other long term (current) drug therapy: Secondary | ICD-10-CM

## 2023-05-10 NOTE — Telephone Encounter (Signed)
Please advise patient it's time to check liver functions, cholesterol and vitamin B12 levels. Orders have been placed. Should be fasting when blood is drawn.

## 2023-05-10 NOTE — Telephone Encounter (Signed)
Patient advised.

## 2023-05-11 DIAGNOSIS — E538 Deficiency of other specified B group vitamins: Secondary | ICD-10-CM | POA: Diagnosis not present

## 2023-05-11 DIAGNOSIS — I10 Essential (primary) hypertension: Secondary | ICD-10-CM | POA: Diagnosis not present

## 2023-05-11 DIAGNOSIS — Z79899 Other long term (current) drug therapy: Secondary | ICD-10-CM | POA: Diagnosis not present

## 2023-05-12 ENCOUNTER — Other Ambulatory Visit: Payer: Self-pay | Admitting: Family Medicine

## 2023-05-12 DIAGNOSIS — B351 Tinea unguium: Secondary | ICD-10-CM

## 2023-05-12 LAB — COMPREHENSIVE METABOLIC PANEL
ALT: 16 [IU]/L (ref 0–32)
AST: 26 [IU]/L (ref 0–40)
Albumin: 4.4 g/dL (ref 3.8–4.8)
Alkaline Phosphatase: 70 [IU]/L (ref 44–121)
BUN/Creatinine Ratio: 27 (ref 12–28)
BUN: 17 mg/dL (ref 8–27)
Bilirubin Total: 0.5 mg/dL (ref 0.0–1.2)
CO2: 23 mmol/L (ref 20–29)
Calcium: 9.4 mg/dL (ref 8.7–10.3)
Chloride: 105 mmol/L (ref 96–106)
Creatinine, Ser: 0.64 mg/dL (ref 0.57–1.00)
Globulin, Total: 2.5 g/dL (ref 1.5–4.5)
Glucose: 86 mg/dL (ref 70–99)
Potassium: 4.2 mmol/L (ref 3.5–5.2)
Sodium: 143 mmol/L (ref 134–144)
Total Protein: 6.9 g/dL (ref 6.0–8.5)
eGFR: 93 mL/min/{1.73_m2} (ref >59)

## 2023-05-12 LAB — LIPID PANEL
Chol/HDL Ratio: 2 ratio (ref 0.0–4.4)
Cholesterol, Total: 166 mg/dL (ref 100–199)
HDL: 81 mg/dL (ref >39)
LDL Chol Calc (NIH): 66 mg/dL (ref 0–99)
Triglycerides: 106 mg/dL (ref 0–149)
VLDL Cholesterol Cal: 19 mg/dL (ref 5–40)

## 2023-05-12 LAB — VITAMIN B12: Vitamin B-12: 545 pg/mL (ref 232–1245)

## 2023-05-12 MED ORDER — TERBINAFINE HCL 250 MG PO TABS
250.0000 mg | ORAL_TABLET | Freq: Every day | ORAL | 4 refills | Status: DC
Start: 2023-05-12 — End: 2024-04-08

## 2023-06-06 ENCOUNTER — Ambulatory Visit: Payer: Medicare HMO

## 2023-06-06 DIAGNOSIS — Z0000 Encounter for general adult medical examination without abnormal findings: Secondary | ICD-10-CM | POA: Diagnosis not present

## 2023-06-06 DIAGNOSIS — Z1231 Encounter for screening mammogram for malignant neoplasm of breast: Secondary | ICD-10-CM

## 2023-06-06 DIAGNOSIS — Z780 Asymptomatic menopausal state: Secondary | ICD-10-CM | POA: Diagnosis not present

## 2023-06-06 NOTE — Patient Instructions (Addendum)
Heidi Hernandez , Thank you for taking time to come for your Medicare Wellness Visit. I appreciate your ongoing commitment to your health goals. Please review the following plan we discussed and let me know if I can assist you in the future.   Referrals/Orders/Follow-Ups/Clinician Recommendations: REFERRALS FOR MAMMOGRAM, BONE DENSITY SCAN  You have an order for:  []   2D Mammogram  [x]   3D Mammogram  [x]   Bone Density     Please call for appointment:  [facility]  [address] [place] [address] [phone] [facility] [address] [place], [place] [address] [phone] [facility] [address] [place], [place] [address] [phone]   Make sure to wear two-piece clothing.  No lotions, powders, or deodorants the day of the appointment. Make sure to bring picture ID and insurance card.  Bring list of medications you are currently taking including any supplements.   Schedule your [place] screening mammogram through MyChart!   Log into your MyChart account.  Go to 'Visit' (or 'Appointments' if on mobile App) --> Schedule an Appointment  Under 'Select a Reason for Visit' choose the Mammogram Screening option.  Complete the pre-visit questions and select the time and place that best fits your schedule.   This is a list of the screening recommended for you and due dates:  Health Maintenance  Topic Date Due   Meningitis B Vaccine (1 of 4 - Increased Risk) 12/02/1960   Hepatitis C Screening  Never done   Zoster (Shingles) Vaccine (1 of 2) Never done   DTaP/Tdap/Td vaccine (2 - Td or Tdap) 05/16/2021   Mammogram  07/22/2022   COVID-19 Vaccine (4 - 2023-24 season) 02/26/2023   DEXA scan (bone density measurement)  07/23/2023   Medicare Annual Wellness Visit  06/05/2024   Colon Cancer Screening  05/02/2026   Pneumonia Vaccine  Completed   Flu  Shot  Completed   HPV Vaccine  Aged Out    Advanced directives: (ACP Link)Information on Advanced Care Planning can be found at [facility] [place] Advance Health Care Directives Advance Health Care Directives ([email or web address]   Next Medicare Annual Wellness Visit scheduled for next year: Yes      06/11/24 @ 2:30 PM BY VIDEO

## 2023-06-06 NOTE — Progress Notes (Signed)
Subjective:   Heidi Hernandez is a 72 y.o. female who presents for Medicare Annual (Subsequent) preventive examination.  Visit Complete: Virtual I connected with  [name] on 06/06/23 by a audio enabled telemedicine application and verified that I am speaking with the correct person using two identifiers.  Patient Location: Home  Provider Location: Office/Clinic  I discussed the limitations of evaluation and management by telemedicine. The patient expressed understanding and agreed to proceed.  Vital Signs: Because this visit was a virtual/telehealth visit, some criteria may be missing or patient reported. Any vitals not documented were not able to be obtained and vitals that have been documented are patient reported.  Cardiac Risk Factors include: advanced age (>32men, >52 women);hypertension     Objective:    There were no vitals filed for this visit. There is no height or weight on file to calculate BMI.     06/06/2023    2:17 PM 11/01/2022    3:17 PM 07/05/2022    6:23 AM 06/29/2022   10:22 AM 06/01/2022    9:38 AM 04/28/2022    2:09 PM 10/28/2021    2:00 PM  Advanced Directives  Does Patient Have a Medical Advance Directive? No No No No No No No  Would patient like information on creating a medical advance directive? No - Patient declined No - Patient declined No - Patient declined  No - Patient declined No - Patient declined No - Patient declined    Current Medications (verified) Outpatient Encounter Medications as of 06/06/2023  Medication Sig   albuterol (VENTOLIN HFA) 108 (90 Base) MCG/ACT inhaler Inhale 2 puffs into the lungs every 6 (six) hours as needed for wheezing. May substitute generic or equivalent brand name   alendronate (FOSAMAX) 70 MG tablet TAKE 1 TABLET BY MOUTH ONCE WEEKLY ON EMTPY STOMACH BEFORE BREAKFAST. REMAIN UPRIGHT FOR 30 MINUTES AND TAKE WITH 8 OUNCES OF WATER   amLODipine (NORVASC) 10 MG tablet TAKE 1 TABLET BY MOUTH DAILY   atenolol  (TENORMIN) 25 MG tablet TAKE 1 TABLET BY MOUTH DAILY   azelastine (ASTELIN) 0.1 % nasal spray Place 2 sprays into both nostrils as needed.   EPINEPHrine 0.3 mg/0.3 mL IJ SOAJ injection as needed.   Fexofenadine HCl (ALLEGRA PO) Take 1 capsule by mouth daily.   fluticasone (FLONASE) 50 MCG/ACT nasal spray Place 2 sprays into both nostrils as needed for allergies.   fluticasone (FLOVENT HFA) 44 MCG/ACT inhaler Inhale 1 puff into the lungs daily.   methadone (DOLOPHINE) 5 MG tablet Take 5 mg by mouth daily.   montelukast (SINGULAIR) 10 MG tablet Take 1 tablet (10 mg total) by mouth at bedtime.   pramipexole (MIRAPEX) 0.5 MG tablet Take 1 tablet by mouth in the morning and at bedtime. 4 pm and 9pm daily   SUMAtriptan (IMITREX) 100 MG tablet TAKE 1 TABLET BY MOUTH AT ONSET OF HEADACHE. MAY TAKE AN ADDITIONAL TABLET IN 2 HOURS IF NEEDED. MAX OF 200MG  IN 24 HOURS   terbinafine (LAMISIL) 250 MG tablet Take 1 tablet (250 mg total) by mouth daily.   triamcinolone cream (KENALOG) 0.1 % Apply 1 Application topically 2 (two) times daily.   Vitamin D, Ergocalciferol, (DRISDOL) 1.25 MG (50000 UNIT) CAPS capsule Take 1 capsule (50,000 Units total) by mouth once a week.   ondansetron (ZOFRAN ODT) 4 MG disintegrating tablet Take 1 tablet (4 mg total) by mouth every 8 (eight) hours as needed for nausea or vomiting. (Patient not taking: Reported on 06/06/2023)  No facility-administered encounter medications on file as of 06/06/2023.    Allergies (verified) Augmentin [amoxicillin-pot clavulanate], Cat hair extract, Dog epithelium (canis lupus familiaris), Droperidol, Molds & smuts, Paroxetine hcl, Tree extract, and Morphine   History: Past Medical History:  Diagnosis Date   Anxiety    Arthritis    Asthma    GERD (gastroesophageal reflux disease)    Headache    Migraines   Heart murmur    Hypertension    Idiopathic thrombocytopenia purpura (HCC)    Required splenectomy   MVP (mitral valve prolapse)     Neuromuscular disorder (HCC)    Restless Leg Syndrome   Pancreatitis April 2017 [facility] due to cholecystitis.    Pneumonia    06-2015   Past Surgical History:  Procedure Laterality Date   ABDOMINAL HYSTERECTOMY     APPENDECTOMY     CARPAL TUNNEL RELEASE Bilateral 11/19/2014   Procedure: CARPAL TUNNEL RELEASE;  Surgeon: Donato Heinz, MD;  Location: [facility] ORS;  Service: Orthopedics;  Laterality: Bilateral;   CESAREAN SECTION     times 2   CHOLECYSTECTOMY  09/29/2015   [facility]   COLONOSCOPY N/A 05/02/2016   Procedure: COLONOSCOPY;  Surgeon: Scot Jun, MD;  Location: [facility] ENDOSCOPY;  Service: Endoscopy;  Laterality: N/A;   ESOPHAGOGASTRODUODENOSCOPY (EGD) WITH PROPOFOL N/A 05/02/2016   Procedure: ESOPHAGOGASTRODUODENOSCOPY (EGD) WITH PROPOFOL;  Surgeon: Scot Jun, MD;  Location: [facility] ENDOSCOPY;  Service: Endoscopy;  Laterality: N/A;   ESOPHAGOGASTRODUODENOSCOPY (EGD) WITH PROPOFOL N/A 11/22/2017   Procedure: ESOPHAGOGASTRODUODENOSCOPY (EGD) WITH PROPOFOL;  Surgeon: Scot Jun, MD;  Location: [facility] ENDOSCOPY;  Service: Endoscopy;  Laterality: N/A;   HERNIA REPAIR     times 3   INSERTION OF MESH  07/05/2022   Procedure: INSERTION OF MESH;  Surgeon: Leafy Ro, MD;  Location: [facility] ORS;  Service: General;;   JOINT REPLACEMENT     OVARIAN CYST SURGERY     SHOULDER ARTHROSCOPY WITH ROTATOR CUFF REPAIR     times 2   SPLENECTOMY, TOTAL     TOTAL SHOULDER ARTHROPLASTY Right 04/21/2017   Procedure: REVERSE SHOULDER ARTHROPLASTY;  Surgeon: Signa Kell, MD;  Location: [facility] ORS;  Service: Orthopedics;  Laterality: Right;   TRIGGER FINGER RELEASE Right 11/19/2014   Procedure: RELEASE TRIGGER FINGER/A-1 PULLEY;  Surgeon: Donato Heinz, MD;  Location: [facility] ORS;  Service: Orthopedics;  Laterality: Right;   TRIGGER FINGER RELEASE Right 08/12/2015   Procedure: RELEASE TRIGGER LONG FINGER;  Surgeon: Donato Heinz, MD;  Location: [facility] ORS;  Service: Orthopedics;  Laterality: Right;    XI ROBOTIC ASSISTED PARAESOPHAGEAL HERNIA REPAIR  07/05/2022   Procedure: XI ROBOTIC ASSISTED PARAESOPHAGEAL HERNIA REPAIR;  Surgeon: Leafy Ro, MD;  Location: [facility] ORS;  Service: General;;   Family History  Problem Relation Age of Onset   Hypertension Mother    Renal Disease Father        ESRD with HD   Social History   Socioeconomic History   Marital status: Married    Spouse name: Sherrine Maples   Number of children: 2   Years of education: Not on file   Highest education level: Bachelor's degree (e.g., BA, AB, BS)  Occupational History   Occupation: Retired  Tobacco Use   Smoking status: Former    Current packs/day: 0.00    Average packs/day: 1 pack/day for 10.0 years (10.0 ttl pk-yrs)    Types: Cigarettes    Start date: 11/09/1969    Quit date: 11/10/1979  Years since quitting: 43.6    Passive exposure: Past   Smokeless tobacco: Never  Vaping Use   Vaping status: Never Used  Substance and Sexual Activity   Alcohol use: Not Currently    Alcohol/week: 0.0 standard drinks of alcohol   Drug use: No   Sexual activity: Never  Other Topics Concern   Not on file  Social History Narrative   Not on file   Social Determinants of Health   Financial Resource Strain: Low Risk  (06/06/2023)   Overall Financial Resource Strain (CARDIA)    Difficulty of Paying Living Expenses: Not hard at all  Food Insecurity: No Food Insecurity (06/06/2023)   Hunger Vital Sign    Worried About Running Out of Food in the Last Year: Never true    Ran Out of Food in the Last Year: Never true  Transportation Needs: No Transportation Needs (06/06/2023)   PRAPARE - Administrator, Civil Service (Medical): No    Lack of Transportation (Non-Medical): No  Physical Activity: Insufficiently Active (06/06/2023)   Exercise Vital Sign    Days of Exercise per Week: 3 days    Minutes of Exercise per Session: 30 min  Stress: No Stress Concern Present (06/06/2023)   [employer] of  Occupational Health - Occupational Stress Questionnaire    Feeling of Stress : Not at all  Social Connections: Moderately Integrated (06/06/2023)   Social Connection and Isolation Panel [NHANES]    Frequency of Communication with Friends and Family: More than three times a week    Frequency of Social Gatherings with Friends and Family: More than three times a week    Attends Religious Services: More than 4 times per year    Active Member of [employer] or Organizations: No    Attends Engineer, structural: Never    Marital Status: Married    Tobacco Counseling Counseling given: Not Answered   Clinical Intake:  Pre-visit preparation completed: Yes  Pain : No/denies pain     BMI - recorded: 20.3 Nutritional Status: BMI of 19-24  Normal Nutritional Risks: None Diabetes: No  How often do you need to have someone help you when you read instructions, pamphlets, or other written materials from your doctor or pharmacy?: 1 - Never  Interpreter Needed?: No  Information entered by :: Kennedy Bucker, LPN   Activities of Daily Living    06/06/2023    2:19 PM 06/06/2023   11:45 AM  In your present state of health, do you have any difficulty performing the following activities:  Hearing? 0 0  Vision? 0 0  Difficulty concentrating or making decisions? 0 0  Walking or climbing stairs? 0 0  Dressing or bathing? 0 0  Doing errands, shopping? 0 0  Preparing Food and eating ? N N  Using the Toilet? N N  In the past six months, have you accidently leaked urine? N N  Do you have problems with loss of bowel control? N N  Managing your Medications? N N  Managing your Finances? N N  Housekeeping or managing your Housekeeping? N N    Patient Care Team: Malva Limes, MD as PCP - General (Family Medicine) Eileen Stanford, MD as Referring Physician (Allergy and Immunology) Sallee Lange, MD as Consulting Physician (Ophthalmology) Mickle Mallory  (Gastroenterology) Idelle Crouch, MD as Referring Physician (Neurology) Morene Crocker, MD as Referring Physician (Neurology)  Indicate any recent Medical Services you may have received from other than [facility] providers in  the past year (date may be approximate).     Assessment:   This is a routine wellness examination for Andia.  Hearing/Vision screen Hearing Screening - Comments:: NO AIDS Vision Screening - Comments:: WEARS GLASSES, DRIVING- DR.DINGELDEIN   Goals Addressed             This Visit's Progress    Cut out extra servings         Depression Screen    06/06/2023    2:16 PM 04/12/2023    9:11 AM 01/06/2023   11:32 AM 10/03/2022   11:21 AM 06/01/2022    9:37 AM 04/27/2022   11:50 AM 05/31/2021    9:48 AM  PHQ 2/9 Scores  PHQ - 2 Score 0 0 0 0 0 0 0  PHQ- 9 Score 0   1 0 3     Fall Risk    06/06/2023    2:18 PM 06/06/2023   11:45 AM 04/12/2023    9:11 AM 01/06/2023   11:32 AM 10/03/2022   11:21 AM  Fall Risk   Falls in the past year? 1 1 0 1 0  Number falls in past yr: 1 1  1  0  Injury with Fall? 1 1 0 1 0  Risk for fall due to : History of fall(s)  No Fall Risks History of fall(s)   Follow up Falls evaluation completed;Falls prevention discussed  Falls evaluation completed Falls evaluation completed     MEDICARE RISK AT HOME: Medicare Risk at Home Any stairs in or around the home?: Yes If so, are there any without handrails?: No Home free of loose throw rugs in walkways, pet beds, electrical cords, etc?: Yes Adequate lighting in your home to reduce risk of falls?: Yes Life alert?: No Use of a cane, walker or w/c?: No Grab bars in the bathroom?: No Shower chair or bench in shower?: Yes Elevated toilet seat or a handicapped toilet?: No  TIMED UP AND GO:  Was the test performed?  No    Cognitive Function:        06/06/2023    2:20 PM 06/01/2022    9:51 AM 03/16/2020    3:06 PM 03/12/2019    2:10 PM 01/12/2017    1:47 PM  6CIT Screen  What  Year? 0 points 0 points 0 points 0 points 0 points  What month? 0 points 0 points 0 points 0 points 0 points  What time? 0 points 0 points 0 points 0 points 0 points  Count back from 20 0 points 0 points 0 points 0 points 0 points  Months in reverse 0 points 0 points 0 points 0 points 0 points  Repeat phrase 0 points 0 points 0 points 0 points 2 points  Total Score 0 points 0 points 0 points 0 points 2 points    Immunizations Immunization History  Administered Date(s) Administered   Fluad Quad(high Dose 65+) 03/29/2019, 06/03/2020, 06/25/2021   Fluad Trivalent(High Dose 65+) 04/12/2023   HIB (PRP-T) 10/19/2016   Influenza, High Dose Seasonal PF 06/16/2020, 06/29/2021, 07/16/2021   Meningococcal B, OMV 10/19/2016   Meningococcal Mcv4o 10/19/2016   PFIZER(Purple Top)SARS-COV-2 Vaccination 08/18/2019, 09/10/2019, 06/16/2020   Pneumococcal Conjugate-13 10/19/2016   Pneumococcal Polysaccharide-23 04/14/2009, 04/22/2015, 04/01/2016, 06/09/2017, 01/15/2018, 06/01/2018, 06/16/2020, 06/29/2021, 07/16/2021   Tdap 05/17/2011    TDAP status: Due, Education has been provided regarding the importance of this vaccine. Advised may receive this vaccine at local pharmacy or Health Dept. Aware to provide a copy of the  vaccination record if obtained from local pharmacy or Health Dept. Verbalized acceptance and understanding.  Flu Vaccine status: Up to date  Pneumococcal vaccine status: Up to date  Covid-19 vaccine status: Completed vaccines  Qualifies for Shingles Vaccine? Yes   Zostavax completed No   Shingrix Completed?: Yes  Screening Tests Health Maintenance  Topic Date Due   Meningococcal B Vaccine (1 of 4 - Increased Risk) 12/02/1960   Hepatitis C Screening  Never done   Zoster Vaccines- Shingrix (1 of 2) Never done   DTaP/Tdap/Td (2 - Td or Tdap) 05/16/2021   MAMMOGRAM  07/22/2022   COVID-19 Vaccine (4 - 2023-24 season) 02/26/2023   DEXA SCAN  07/23/2023   Medicare Annual Wellness  (AWV)  06/05/2024   Colonoscopy  05/02/2026   Pneumonia Vaccine 30+ Years old  Completed   INFLUENZA VACCINE  Completed   HPV VACCINES  Aged Out  STATES HAD BOTH SHINGRIX SHOTS AT [facility]  Health Maintenance  Health Maintenance Due  Topic Date Due   Meningococcal B Vaccine (1 of 4 - Increased Risk) 12/02/1960   Hepatitis C Screening  Never done   Zoster Vaccines- Shingrix (1 of 2) Never done   DTaP/Tdap/Td (2 - Td or Tdap) 05/16/2021   MAMMOGRAM  07/22/2022   COVID-19 Vaccine (4 - 2023-24 season) 02/26/2023    Colorectal cancer screening: Type of screening: Colonoscopy. Completed 05/02/16. Repeat every 5 years  Mammogram status: Completed 07/22/21. Repeat every year  Bone Density status: Completed 07/22/21. Results reflect: Bone density results: OSTEOPOROSIS. Repeat every 2 years.  Lung Cancer Screening: (Low Dose CT Chest recommended if Age 47-80 years, 20 pack-year currently smoking OR have quit w/in 15years.) does not qualify.    Additional Screening:  Hepatitis C Screening: does qualify; Completed NO  Vision Screening: Recommended annual ophthalmology exams for early detection of glaucoma and other disorders of the eye. Is the patient up to date with their annual eye exam?  Yes  Who is the provider or what is the name of the office in which the patient attends annual eye exams? DR.DINGELDEIN If pt is not established with a provider, would they like to be referred to a provider to establish care? No .   Dental Screening: Recommended annual dental exams for proper oral hygiene   Community Resource Referral / Chronic Care Management: CRR required this visit?  No   CCM required this visit?  No     Plan:     I have personally reviewed and noted the following in the patient's chart:   Medical and social history Use of alcohol, tobacco or illicit drugs  Current medications and supplements including opioid prescriptions. Patient is not currently taking opioid  prescriptions. Functional ability and status Nutritional status Physical activity Advanced directives List of other physicians Hospitalizations, surgeries, and ER visits in previous 12 months Vitals Screenings to include cognitive, depression, and falls Referrals and appointments  In addition, I have reviewed and discussed with patient certain preventive protocols, quality metrics, and best practice recommendations. A written personalized care plan for preventive services as well as general preventive health recommendations were provided to patient.     Hal Hope, LPN   29/56/2130   After Visit Summary: (MyChart) Due to this being a telephonic visit, the after visit summary with patients personalized plan was offered to patient via MyChart   Nurse Notes: NONE

## 2023-06-07 ENCOUNTER — Encounter: Payer: Medicare HMO | Admitting: Family Medicine

## 2023-06-08 ENCOUNTER — Other Ambulatory Visit: Payer: Self-pay | Admitting: Family Medicine

## 2023-06-08 DIAGNOSIS — I10 Essential (primary) hypertension: Secondary | ICD-10-CM

## 2023-06-08 NOTE — Telephone Encounter (Signed)
Requested Prescriptions  Pending Prescriptions Disp Refills   atenolol (TENORMIN) 25 MG tablet [Pharmacy Med Name: ATENOLOL 25 MG TABLET] 90 tablet 0    Sig: TAKE 1 TABLET BY MOUTH DAILY     Cardiovascular: Beta Blockers 2 Passed - 06/08/2023 10:51 AM      Passed - Cr in normal range and within 360 days    Creatinine, Ser  Date Value Ref Range Status  05/11/2023 0.64 0.57 - 1.00 mg/dL Final         Passed - Last BP in normal range    BP Readings from Last 1 Encounters:  04/12/23 (!) 138/59         Passed - Last Heart Rate in normal range    Pulse Readings from Last 1 Encounters:  04/12/23 (!) 54         Passed - Valid encounter within last 6 months    Recent Outpatient Visits           1 month ago Influenza vaccine needed   [facility] Malva Limes, MD   4 months ago Dysuria   [facility] Malva Limes, MD   5 months ago Neuropathy   [facility] Malva Limes, MD   7 months ago Acute non-recurrent frontal sinusitis   [facility] Alfredia Ferguson, PA-C   8 months ago Dysuria   [facility] Malva Limes, MD       Future Appointments             In 4 days Fisher, Demetrios Isaacs, MD [facility]

## 2023-06-12 ENCOUNTER — Ambulatory Visit: Payer: Medicare HMO | Admitting: Family Medicine

## 2023-06-12 VITALS — BP 127/62 | HR 67 | Temp 98.4°F | Resp 14 | Ht 64.0 in | Wt 120.5 lb

## 2023-06-12 DIAGNOSIS — Z0000 Encounter for general adult medical examination without abnormal findings: Secondary | ICD-10-CM

## 2023-06-12 DIAGNOSIS — L219 Seborrheic dermatitis, unspecified: Secondary | ICD-10-CM

## 2023-06-12 DIAGNOSIS — E538 Deficiency of other specified B group vitamins: Secondary | ICD-10-CM

## 2023-06-12 DIAGNOSIS — M810 Age-related osteoporosis without current pathological fracture: Secondary | ICD-10-CM | POA: Diagnosis not present

## 2023-06-12 DIAGNOSIS — E559 Vitamin D deficiency, unspecified: Secondary | ICD-10-CM

## 2023-06-12 DIAGNOSIS — I10 Essential (primary) hypertension: Secondary | ICD-10-CM | POA: Diagnosis not present

## 2023-06-12 MED ORDER — NEOMYCIN-POLYMYXIN-HC 3.5-10000-1 OT SOLN: OTIC | 1 refills | Status: DC

## 2023-06-12 NOTE — Patient Instructions (Signed)
.   Please review the attached list of medications and notify my office if there are any errors.   . Please bring all of your medications to every appointment so we can make sure that our medication list is the same as yours.   

## 2023-06-12 NOTE — Progress Notes (Signed)
Complete physical exam   Patient: Heidi Hernandez   DOB: [birth year]   [name]  MRN: [id number] Visit Date: 06/12/2023  Today's healthcare provider: [name], MD   Chief Complaint  Patient presents with   Annual Exam   Subjective    Heidi Hernandez is a 72 y.o. female who presents today for a complete physical exam.  She reports consuming a  healthy  diet.  She generally feels well. She reports sleeping well. She does not have additional problems to discuss today.   She reports neuropathy is much better, didn't tolerate alpha-lipoic acid but is taking B12 and D supplements. On methadone hs by neurology for RLS which has been very effective. Finished about 2 months Lamisil for onychomycosis and starting to see some improvement.   No visits with results within 30 Day(s) from this visit.  Latest known visit with results is:  Orders Only on 05/10/2023  Component Date Value Ref Range Status   Glucose 05/11/2023 86  70 - 99 mg/dL Final   BUN 29/51/8841 17  8 - 27 mg/dL Final   Creatinine, Ser 05/11/2023 0.64  0.57 - 1.00 mg/dL Final   eGFR 66/11/3014 93  >59 mL/min/1.73 Final   BUN/Creatinine Ratio 05/11/2023 27  12 - 28 Final   Sodium 05/11/2023 143  134 - 144 mmol/L Final   Potassium 05/11/2023 4.2  3.5 - 5.2 mmol/L Final   Chloride 05/11/2023 105  96 - 106 mmol/L Final   CO2 05/11/2023 23  20 - 29 mmol/L Final   Calcium 05/11/2023 9.4  8.7 - 10.3 mg/dL Final   Total Protein 07/05/3233 6.9  6.0 - 8.5 g/dL Final   Albumin 57/32/2025 4.4  3.8 - 4.8 g/dL Final   Globulin, Total 05/11/2023 2.5  1.5 - 4.5 g/dL Final   Bilirubin Total 05/11/2023 0.5  0.0 - 1.2 mg/dL Final   Alkaline Phosphatase 05/11/2023 70  44 - 121 IU/L Final   AST 05/11/2023 26  0 - 40 IU/L Final   ALT 05/11/2023 16  0 - 32 IU/L Final   Vitamin B-12 05/11/2023 545  232 - 1,245 pg/mL Final   Cholesterol, Total 05/11/2023 166  100 - 199 mg/dL Final   Triglycerides 42/70/6237 106  0 - 149 mg/dL  Final   HDL 62/83/1517 81  >39 mg/dL Final   VLDL Cholesterol Cal 05/11/2023 19  5 - 40 mg/dL Final   LDL Chol Calc (NIH) 05/11/2023 66  0 - 99 mg/dL Final   Chol/HDL Ratio 05/11/2023 2.0  0.0 - 4.4 ratio Final   Comment:                                   T. Chol/HDL Ratio                                             Men  Women                               1/2 Avg.Risk  3.4    3.3  Avg.Risk  5.0    4.4                                2X Avg.Risk  9.6    7.1                                3X Avg.Risk 23.4   11.0     Past Medical History:  Diagnosis Date   Anxiety    Arthritis    Asthma    GERD (gastroesophageal reflux disease)    Headache    Migraines   Heart murmur    Hypertension    Idiopathic thrombocytopenia purpura (HCC)    Required splenectomy   MVP (mitral valve prolapse)    Neuromuscular disorder (HCC)    Restless Leg Syndrome   Pancreatitis April 2017 [facility] due to cholecystitis.    Pneumonia    06-2015   Past Surgical History:  Procedure Laterality Date   ABDOMINAL HYSTERECTOMY     APPENDECTOMY     CARPAL TUNNEL RELEASE Bilateral 11/19/2014   Procedure: CARPAL TUNNEL RELEASE;  Surgeon: Donato Heinz, MD;  Location: [facility] ORS;  Service: Orthopedics;  Laterality: Bilateral;   CESAREAN SECTION     times 2   CHOLECYSTECTOMY  09/29/2015   [facility]   COLONOSCOPY N/A 05/02/2016   Procedure: COLONOSCOPY;  Surgeon: Scot Jun, MD;  Location: [facility] ENDOSCOPY;  Service: Endoscopy;  Laterality: N/A;   ESOPHAGOGASTRODUODENOSCOPY (EGD) WITH PROPOFOL N/A 05/02/2016   Procedure: ESOPHAGOGASTRODUODENOSCOPY (EGD) WITH PROPOFOL;  Surgeon: Scot Jun, MD;  Location: [facility] ENDOSCOPY;  Service: Endoscopy;  Laterality: N/A;   ESOPHAGOGASTRODUODENOSCOPY (EGD) WITH PROPOFOL N/A 11/22/2017   Procedure: ESOPHAGOGASTRODUODENOSCOPY (EGD) WITH PROPOFOL;  Surgeon: Scot Jun, MD;  Location: [facility] ENDOSCOPY;  Service: Endoscopy;  Laterality: N/A;    HERNIA REPAIR     times 3   INSERTION OF MESH  07/05/2022   Procedure: INSERTION OF MESH;  Surgeon: Leafy Ro, MD;  Location: [facility] ORS;  Service: General;;   JOINT REPLACEMENT     OVARIAN CYST SURGERY     SHOULDER ARTHROSCOPY WITH ROTATOR CUFF REPAIR     times 2   SPLENECTOMY, TOTAL     TOTAL SHOULDER ARTHROPLASTY Right 04/21/2017   Procedure: REVERSE SHOULDER ARTHROPLASTY;  Surgeon: Signa Kell, MD;  Location: [facility] ORS;  Service: Orthopedics;  Laterality: Right;   TRIGGER FINGER RELEASE Right 11/19/2014   Procedure: RELEASE TRIGGER FINGER/A-1 PULLEY;  Surgeon: Donato Heinz, MD;  Location: [facility] ORS;  Service: Orthopedics;  Laterality: Right;   TRIGGER FINGER RELEASE Right 08/12/2015   Procedure: RELEASE TRIGGER LONG FINGER;  Surgeon: Donato Heinz, MD;  Location: [facility] ORS;  Service: Orthopedics;  Laterality: Right;   XI ROBOTIC ASSISTED PARAESOPHAGEAL HERNIA REPAIR  07/05/2022   Procedure: XI ROBOTIC ASSISTED PARAESOPHAGEAL HERNIA REPAIR;  Surgeon: Leafy Ro, MD;  Location: [facility] ORS;  Service: General;;   Social History   Socioeconomic History   Marital status: Married    Spouse name: Sherrine Maples   Number of children: 2   Years of education: Not on file   Highest education level: Bachelor's degree (e.g., BA, AB, BS)  Occupational History   Occupation: Retired  Tobacco Use   Smoking status: Former    Current packs/day: 0.00    Average packs/day: 1 pack/day for 10.0 years (10.0 ttl pk-yrs)  Types: Cigarettes    Start date: 11/09/1969    Quit date: 11/10/1979    Years since quitting: 43.6    Passive exposure: Past   Smokeless tobacco: Never  Vaping Use   Vaping status: Never Used  Substance and Sexual Activity   Alcohol use: Not Currently    Alcohol/week: 0.0 standard drinks of alcohol   Drug use: No   Sexual activity: Never  Other Topics Concern   Not on file  Social History Narrative   Not on file   Social Drivers of Health   Financial Resource Strain: Low Risk   (06/06/2023)   Overall Financial Resource Strain (CARDIA)    Difficulty of Paying Living Expenses: Not hard at all  Food Insecurity: No Food Insecurity (06/06/2023)   Hunger Vital Sign    Worried About Running Out of Food in the Last Year: Never true    Ran Out of Food in the Last Year: Never true  Transportation Needs: No Transportation Needs (06/06/2023)   PRAPARE - Administrator, Civil Service (Medical): No    Lack of Transportation (Non-Medical): No  Physical Activity: Insufficiently Active (06/06/2023)   Exercise Vital Sign    Days of Exercise per Week: 3 days    Minutes of Exercise per Session: 30 min  Stress: No Stress Concern Present (06/06/2023)   [employer] of Occupational Health - Occupational Stress Questionnaire    Feeling of Stress : Not at all  Social Connections: Moderately Integrated (06/06/2023)   Social Connection and Isolation Panel [NHANES]    Frequency of Communication with Friends and Family: More than three times a week    Frequency of Social Gatherings with Friends and Family: More than three times a week    Attends Religious Services: More than 4 times per year    Active Member of [employer] or Organizations: No    Attends Banker Meetings: Never    Marital Status: Married  Catering manager Violence: Not At Risk (06/06/2023)   Humiliation, Afraid, Rape, and Kick questionnaire    Fear of Current or Ex-Partner: No    Emotionally Abused: No    Physically Abused: No    Sexually Abused: No   Family Status  Relation Name Status   Mother  Deceased at age 24       aortic aneurysm   Father  Deceased at age 62       Prostate Cancer   Brother  Alive       ITP   Brother  Alive       ITP  No partnership data on file   Family History  Problem Relation Age of Onset   Hypertension Mother    Renal Disease Father        ESRD with HD   Allergies  Allergen Reactions   Alpha-Lipoic Acid Nausea Only   Augmentin [Amoxicillin-Pot  Clavulanate] Nausea And Vomiting   Cat Hair Extract     Other reaction(s): Not available   Dog Epithelium (Canis Lupus Familiaris)     Other reaction(s): Not available   Droperidol Other (See Comments)    "Locked jaw" Facial muscles locked "Locked jaw"   Molds & Smuts     Other reaction(s): Not available   Paroxetine Hcl Other (See Comments)    syncope Passed out    Tree Extract     Other reaction(s): Not available   Morphine Itching    Patient Care Team: Malva Limes, MD as PCP - General (  Family Medicine) Eileen Stanford, MD as Referring Physician (Allergy and Immunology) Sallee Lange, MD as Consulting Physician (Ophthalmology) Mickle Mallory (Gastroenterology) Idelle Crouch, MD as Referring Physician (Neurology) Morene Crocker, MD as Referring Physician (Neurology)   Medications: Outpatient Medications Prior to Visit  Medication Sig   albuterol (VENTOLIN HFA) 108 (90 Base) MCG/ACT inhaler Inhale 2 puffs into the lungs every 6 (six) hours as needed for wheezing. May substitute generic or equivalent brand name   alendronate (FOSAMAX) 70 MG tablet TAKE 1 TABLET BY MOUTH ONCE WEEKLY ON EMTPY STOMACH BEFORE BREAKFAST. REMAIN UPRIGHT FOR 30 MINUTES AND TAKE WITH 8 OUNCES OF WATER   amLODipine (NORVASC) 10 MG tablet TAKE 1 TABLET BY MOUTH DAILY   atenolol (TENORMIN) 25 MG tablet TAKE 1 TABLET BY MOUTH DAILY   azelastine (ASTELIN) 0.1 % nasal spray Place 2 sprays into both nostrils as needed.   cyanocobalamin (VITAMIN B12) 1000 MCG tablet Take 1,000 mcg by mouth daily.   EPINEPHrine 0.3 mg/0.3 mL IJ SOAJ injection as needed.   Fexofenadine HCl (ALLEGRA PO) Take 1 capsule by mouth daily.   fluticasone (FLONASE) 50 MCG/ACT nasal spray Place 2 sprays into both nostrils as needed for allergies.   fluticasone (FLOVENT HFA) 44 MCG/ACT inhaler Inhale 1 puff into the lungs daily.   methadone (DOLOPHINE) 5 MG tablet Take 5 mg by mouth daily.   montelukast (SINGULAIR) 10  MG tablet Take 1 tablet (10 mg total) by mouth at bedtime.   ondansetron (ZOFRAN ODT) 4 MG disintegrating tablet Take 1 tablet (4 mg total) by mouth every 8 (eight) hours as needed for nausea or vomiting.   pramipexole (MIRAPEX) 0.5 MG tablet Take 1 tablet by mouth in the morning and at bedtime. 4 pm and 9pm daily   SUMAtriptan (IMITREX) 100 MG tablet TAKE 1 TABLET BY MOUTH AT ONSET OF HEADACHE. MAY TAKE AN ADDITIONAL TABLET IN 2 HOURS IF NEEDED. MAX OF 200MG  IN 24 HOURS   terbinafine (LAMISIL) 250 MG tablet Take 1 tablet (250 mg total) by mouth daily.   triamcinolone cream (KENALOG) 0.1 % Apply 1 Application topically 2 (two) times daily.   Vitamin D, Ergocalciferol, (DRISDOL) 1.25 MG (50000 UNIT) CAPS capsule Take 1 capsule (50,000 Units total) by mouth once a week.   No facility-administered medications prior to visit.    Review of Systems  Constitutional:  Negative for appetite change, chills, fatigue and fever.  Respiratory:  Negative for chest tightness and shortness of breath.   Cardiovascular:  Negative for chest pain and palpitations.  Gastrointestinal:  Negative for abdominal pain, nausea and vomiting.  Neurological:  Negative for dizziness and weakness.    Objective    BP 127/62 (BP Location: Left Arm, Patient Position: Sitting, Cuff Size: Normal)   Pulse 67   Temp 98.4 F (36.9 C)   Resp 14   Ht 5\' 4"  (1.626 m)   Wt 120 lb 8 oz (54.7 kg)   SpO2 98%   BMI 20.68 kg/m    Physical Exam   General Appearance:    Well developed, well nourished female. Alert, cooperative, in no acute distress, appears stated age   Head:    Normocephalic, without obvious abnormality, atraumatic  Eyes:    PERRL, conjunctiva/corneas clear, EOM's intact, fundi    benign, both eyes  Ears:    Seborrhea of both ears, L>R  Nose:   Nares normal, septum midline, mucosa normal, no drainage    or sinus tenderness  Throat:   Lips,  mucosa, and tongue normal; teeth and gums normal  Neck:   Supple,  symmetrical, trachea midline, no adenopathy;    thyroid:  no enlargement/tenderness/nodules; no carotid   bruit or JVD  Back:     Symmetric, no curvature, ROM normal, no CVA tenderness  Lungs:     Clear to auscultation bilaterally, respirations unlabored  Chest Wall:    No tenderness or deformity   Heart:    Normal heart rate. Normal rhythm.  2/6 systolic murmur at right upper sternal border  Breast Exam:    deferred  Abdomen:     Soft, non-tender, bowel sounds active all four quadrants,    no masses, no organomegaly  Pelvic:    deferred  Extremities:   All extremities are intact. No cyanosis or edema  Pulses:   2+ and symmetric all extremities  Skin:   Skin color, texture, turgor normal, no rashes or lesions  Lymph nodes:   Cervical, supraclavicular, and axillary nodes normal  Neurologic:   CNII-XII intact, normal strength, sensation and reflexes    throughout     Last depression screening scores    06/12/2023    3:49 PM 06/06/2023    2:16 PM 04/12/2023    9:11 AM  PHQ 2/9 Scores  PHQ - 2 Score 0 0 0  PHQ- 9 Score 1 0    Last fall risk screening    06/06/2023    2:18 PM  Fall Risk   Falls in the past year? 1  Number falls in past yr: 1  Injury with Fall? 1  Risk for fall due to : History of fall(s)  Follow up Falls evaluation completed;Falls prevention discussed   Last Audit-C alcohol use screening    06/06/2023    2:15 PM  Alcohol Use Disorder Test (AUDIT)  1. How often do you have a drink containing alcohol? 0  2. How many drinks containing alcohol do you have on a typical day when you are drinking? 0  3. How often do you have six or more drinks on one occasion? 0  AUDIT-C Score 0   A score of 3 or more in women, and 4 or more in men indicates increased risk for alcohol abuse, EXCEPT if all of the points are from question 1   No results found for any visits on 06/12/23.  Assessment & Plan    Routine Health Maintenance and Physical Exam  Exercise  Activities and Dietary recommendations  Goals      Cut out extra servings     DIET - EAT MORE FRUITS AND VEGETABLES     DIET - EAT MORE FRUITS AND VEGETABLES     DIET - INCREASE WATER INTAKE     Recommend increasing water intake to 4 glasses a day.          Immunization History  Administered Date(s) Administered   Fluad Quad(high Dose 65+) 03/29/2019, 06/03/2020, 06/25/2021   Fluad Trivalent(High Dose 65+) 04/12/2023   HIB (PRP-T) 10/19/2016   Influenza, High Dose Seasonal PF 06/16/2020, 06/29/2021, 07/16/2021   Meningococcal B, OMV 10/19/2016   Meningococcal Mcv4o 10/19/2016   PFIZER(Purple Top)SARS-COV-2 Vaccination 08/18/2019, 09/10/2019, 06/16/2020   Pneumococcal Conjugate-13 10/19/2016   Pneumococcal Polysaccharide-23 04/14/2009, 04/22/2015, 04/01/2016, 06/09/2017, 01/15/2018, 06/01/2018, 06/16/2020, 06/29/2021, 07/16/2021   Tdap 05/17/2011    Health Maintenance  Topic Date Due   Meningococcal B Vaccine (1 of 4 - Increased Risk) 12[birth year]02/1960   Hepatitis C Screening  Never done   Zoster Vaccines- Shingrix (1 of 2)  Never done   DTaP/Tdap/Td (2 - Td or Tdap) 05/16/2021   MAMMOGRAM  07/22/2022   COVID-19 Vaccine (4 - 2024-25 season) 02/26/2023   DEXA SCAN  07/23/2023   Medicare Annual Wellness (AWV)  06/05/2024   Colonoscopy  05/02/2026   Pneumonia Vaccine 65+ Years old  Completed   INFLUENZA VACCINE  Completed   HPV VACCINES  Aged Out    Discussed health benefits of physical activity, and encouraged her to engage in regular exercise appropriate for her age and condition.    2. Essential hypertension Well controlled.  Continue current medications.    3. Osteoporosis without current pathological fracture, unspecified osteoporosis type Continue alendronate and vitamin D, BMD next year.   4. Vitamin D deficiency Continue vitamin d supplement.   5. B12 deficiency Continue b12 supplement.   6. Seborrhea She states her ear does get sore and bothers her off and  on.   - neomycin-polymyxin-hydrocortisone (CORTISPORIN) OTIC solution; 3 drops in affected ear twice a day as needed  Dispense: 10 mL; Refill: 1 - cyanocobalamin (VITAMIN B12) 1000 MCG tablet; Take 1,000 mcg by mouth daily.  Return in about 6 months (around 12/11/2023) for Hypertension.        [name], MD  [facility] [phone] (phone) [phone] (fax)  [facility]

## 2023-06-26 ENCOUNTER — Encounter: Payer: Self-pay | Admitting: Oncology

## 2023-07-10 ENCOUNTER — Encounter: Payer: Self-pay | Admitting: Oncology

## 2023-07-11 ENCOUNTER — Other Ambulatory Visit: Payer: Self-pay | Admitting: Family Medicine

## 2023-07-11 DIAGNOSIS — I10 Essential (primary) hypertension: Secondary | ICD-10-CM

## 2023-07-14 DIAGNOSIS — M65322 Trigger finger, left index finger: Secondary | ICD-10-CM | POA: Diagnosis not present

## 2023-07-14 DIAGNOSIS — M65312 Trigger thumb, left thumb: Secondary | ICD-10-CM | POA: Diagnosis not present

## 2023-07-14 DIAGNOSIS — M65332 Trigger finger, left middle finger: Secondary | ICD-10-CM | POA: Diagnosis not present

## 2023-07-17 ENCOUNTER — Encounter: Payer: Self-pay | Admitting: Oncology

## 2023-07-17 ENCOUNTER — Encounter: Payer: Self-pay | Admitting: Surgery

## 2023-07-17 ENCOUNTER — Ambulatory Visit (INDEPENDENT_AMBULATORY_CARE_PROVIDER_SITE_OTHER): Payer: Medicare HMO | Admitting: Surgery

## 2023-07-17 VITALS — BP 160/74 | HR 64 | Temp 98.0°F | Ht 64.0 in | Wt 123.0 lb

## 2023-07-17 DIAGNOSIS — K432 Incisional hernia without obstruction or gangrene: Secondary | ICD-10-CM | POA: Diagnosis not present

## 2023-07-17 NOTE — H&P (View-Only) (Signed)
Outpatient Surgical Follow Up  07/17/2023  Heidi Hernandez is an 73 y.o. female.   Chief Complaint  Patient presents with   Follow-up    HPI: Heidi Hernandez is a 73 year old female hospice diaphragmatic hernia repair years ago.  She has done well.  She is not experiencing any dysphagia no pulmonary issues.  She does report a bulging within the abdominal wall.  Denies any fevers any chills or any pain. Does have extensive surgical history including open splenectomy, appendectomy, abdominal hysterectomy and also prior hernia repair addition to robotic phlegmatic  hernia pair  Past Medical History:  Diagnosis Date   Anxiety    Arthritis    Asthma    GERD (gastroesophageal reflux disease)    Headache    Migraines   Heart murmur    Hypertension    Idiopathic thrombocytopenia purpura (HCC)    Required splenectomy   MVP (mitral valve prolapse)    Neuromuscular disorder (HCC)    Restless Leg Syndrome   Pancreatitis April 2017 [facility] due to cholecystitis.    Pneumonia    06-2015    Past Surgical History:  Procedure Laterality Date   ABDOMINAL HYSTERECTOMY     APPENDECTOMY     CARPAL TUNNEL RELEASE Bilateral 11/19/2014   Procedure: CARPAL TUNNEL RELEASE;  Surgeon: Donato Heinz, MD;  Location: [facility] ORS;  Service: Orthopedics;  Laterality: Bilateral;   CESAREAN SECTION     times 2   CHOLECYSTECTOMY  09/29/2015   [facility]   COLONOSCOPY N/A 05/02/2016   Procedure: COLONOSCOPY;  Surgeon: Scot Jun, MD;  Location: [facility] ENDOSCOPY;  Service: Endoscopy;  Laterality: N/A;   ESOPHAGOGASTRODUODENOSCOPY (EGD) WITH PROPOFOL N/A 05/02/2016   Procedure: ESOPHAGOGASTRODUODENOSCOPY (EGD) WITH PROPOFOL;  Surgeon: Scot Jun, MD;  Location: [facility] ENDOSCOPY;  Service: Endoscopy;  Laterality: N/A;   ESOPHAGOGASTRODUODENOSCOPY (EGD) WITH PROPOFOL N/A 11/22/2017   Procedure: ESOPHAGOGASTRODUODENOSCOPY (EGD) WITH PROPOFOL;  Surgeon: Scot Jun, MD;  Location: [facility] ENDOSCOPY;  Service:  Endoscopy;  Laterality: N/A;   HERNIA REPAIR     times 3   INSERTION OF MESH  07/05/2022   Procedure: INSERTION OF MESH;  Surgeon: Leafy Ro, MD;  Location: [facility] ORS;  Service: General;;   JOINT REPLACEMENT     OVARIAN CYST SURGERY     SHOULDER ARTHROSCOPY WITH ROTATOR CUFF REPAIR     times 2   SPLENECTOMY, TOTAL     TOTAL SHOULDER ARTHROPLASTY Right 04/21/2017   Procedure: REVERSE SHOULDER ARTHROPLASTY;  Surgeon: Signa Kell, MD;  Location: [facility] ORS;  Service: Orthopedics;  Laterality: Right;   TRIGGER FINGER RELEASE Right 11/19/2014   Procedure: RELEASE TRIGGER FINGER/A-1 PULLEY;  Surgeon: Donato Heinz, MD;  Location: [facility] ORS;  Service: Orthopedics;  Laterality: Right;   TRIGGER FINGER RELEASE Right 08/12/2015   Procedure: RELEASE TRIGGER LONG FINGER;  Surgeon: Donato Heinz, MD;  Location: [facility] ORS;  Service: Orthopedics;  Laterality: Right;   XI ROBOTIC ASSISTED PARAESOPHAGEAL HERNIA REPAIR  07/05/2022   Procedure: XI ROBOTIC ASSISTED PARAESOPHAGEAL HERNIA REPAIR;  Surgeon: Leafy Ro, MD;  Location: [facility] ORS;  Service: General;;    Family History  Problem Relation Age of Onset   Hypertension Mother    Renal Disease Father        ESRD with HD    Social History:  reports that she quit smoking about 43 years ago. Her smoking use included cigarettes. She started smoking about 53 years ago. She has a 10 pack-year smoking history. She  has been exposed to tobacco smoke. She has never used smokeless tobacco. She reports that she does not currently use alcohol. She reports that she does not use drugs.  Allergies:  Allergies  Allergen Reactions   Alpha-Lipoic Acid Nausea Only   Augmentin [Amoxicillin-Pot Clavulanate] Nausea And Vomiting   Cat Dander     Other reaction(s): Not available   Dog Epithelium (Canis Lupus Familiaris)     Other reaction(s): Not available   Droperidol Other (See Comments)    "Locked jaw" Facial muscles locked "Locked jaw"   Molds & Smuts      Other reaction(s): Not available   Paroxetine Hcl Other (See Comments)    syncope Passed out    Tree Extract     Other reaction(s): Not available   Morphine Itching    Medications reviewed.    ROS Full ROS performed and is otherwise negative other than what is stated in HPI   BP (!) 160/74   Pulse 64   Temp 98 F (36.7 C)   Ht 5\' 4"  (1.626 m)   Wt 123 lb (55.8 kg)   SpO2 98%   BMI 21.11 kg/m   Physical Exam Vitals and nursing note reviewed. Exam conducted with a chaperone present.  Constitutional:      General: She is not in acute distress.    Appearance: Normal appearance. She is not ill-appearing or diaphoretic.  Cardiovascular:     Rate and Rhythm: Normal rate and regular rhythm.     Heart sounds: No murmur heard. Pulmonary:     Effort: Pulmonary effort is normal. No respiratory distress.     Breath sounds: Normal breath sounds. No stridor.  Abdominal:     General: Abdomen is flat. There is no distension.     Palpations: Abdomen is soft. There is no mass.     Tenderness: There is no guarding or rebound.     Hernia: A hernia is present.     Comments: 3 Centimeter reducible ventral hernia periumbilical region  Musculoskeletal:        General: No swelling or tenderness. Normal range of motion.     Cervical back: Normal range of motion and neck supple. No rigidity.  Skin:    General: Skin is warm and dry.     Capillary Refill: Capillary refill takes less than 2 seconds.  Neurological:     General: No focal deficit present.     Mental Status: She is alert and oriented to person, place, and time.  Psychiatric:        Mood and Affect: Mood normal.        Behavior: Behavior normal.        Thought Content: Thought content normal.        Judgment: Judgment normal.     Assessment/Plan: Recurrent ventral hernia.  Gust with the patient in detail about her disease process.  I definitely recommend repair with mesh.  Do think that we can do an open repair safely.   Procedure discussed with the patient detail.  Risk, benefits and possible complications including but not limited to: Bleeding, infection bowel injuries, recurrence chronic pain.  She understands and wished to proceed.  She wishes to perform this as soon as possible. Think that we have someavailable later this week.  Please note that I spent 30 minutes in this encounter including person reviewing medical record, placing orders, counseling the patient and performing documentation    Sterling Big, MD [facility] General Surgeon

## 2023-07-17 NOTE — Patient Instructions (Signed)
You have requested to have a Ventral Hernia Repair. This will be done by Dr Everlene Farrier at [facility]. Please see your (BLUE) Pre-care sheet for more information. Our surgery scheduler will call you to look at surgery dates and to go over surgery information.   You will need to arrange to be out of work for approximately 1-2 weeks and then you may return with a lifting restriction for 4 more weeks. If you have FMLA or Disability paperwork that needs to be filled out, please have your company fax your paperwork to [phone] or you may drop this by either office. This paperwork will be filled out within 3 days after your surgery has been completed.     Ventral Hernia A ventral hernia (also called an incisional hernia) is a hernia that occurs at the site of a previous surgical cut (incision) in the abdomen. The abdominal wall spans from your lower chest down to your pelvis. If the abdominal wall is weakened from a surgical incision, a hernia can occur. A hernia is a bulge of bowel or muscle tissue pushing out on the weakened part of the abdominal wall. Ventral hernias can get bigger from straining or lifting. Obese and older people are at higher risk for a ventral hernia. People who develop infections after surgery or require repeat incisions at the same site on the abdomen are also at increased risk. CAUSES  A ventral hernia occurs because of weakness in the abdominal wall at an incision site.  SYMPTOMS  Common symptoms include: A visible bulge or lump on the abdominal wall. Pain or tenderness around the lump. Increased discomfort if you cough or make a sudden movement. If the hernia has blocked part of the intestine, a serious complication can occur (incarcerated or strangulated hernia). This can become a problem that requires emergency surgery because the blood flow to the blocked intestine may be cut off. Symptoms may include: Feeling sick to your stomach (nauseous). Throwing up (vomiting). Stomach  swelling (distention) or bloating. Fever. Rapid heartbeat. DIAGNOSIS  Your health care provider will take a medical history and perform a physical exam. Various tests may be ordered, such as: Blood tests. Urine tests. Ultrasonography. X-rays. Computed tomography (CT). TREATMENT  Watchful waiting may be all that is needed for a smaller hernia that does not cause symptoms. Your health care provider may recommend the use of a supportive belt (truss) that helps to keep the abdominal wall intact. For larger hernias or those that cause pain, surgery to repair the hernia is usually recommended. If a hernia becomes strangulated, emergency surgery needs to be done right away. HOME CARE INSTRUCTIONS Avoid putting pressure or strain on the abdominal area. Avoid heavy lifting. Use good body positioning for physical tasks. Ask your health care provider about proper body positioning. Use a supportive belt as directed by your health care provider. Maintain a healthy weight. Eat foods that are high in fiber, such as whole grains, fruits, and vegetables. Fiber helps prevent difficult bowel movements (constipation). Drink enough fluids to keep your urine clear or pale yellow. Follow up with your health care provider as directed. SEEK MEDICAL CARE IF:  Your hernia seems to be getting larger or more painful. SEEK IMMEDIATE MEDICAL CARE IF:  You have abdominal pain that is sudden and sharp. Your pain becomes severe. You have repeated vomiting. You are sweating a lot. You notice a rapid heartbeat. You develop a fever. MAKE SURE YOU:  Understand these instructions. Will watch your condition. Will get help  right away if you are not doing well or get worse.     Open Ventral Hernia Repair Open ventral hernia repair is a surgery to fix a ventral hernia. A ventral hernia,  is a bulge of body tissue or intestines that pushes through the front part of the abdomen. This can happen if the connective tissue  covering the muscles over the abdomen has a weak spot or is torn because of a surgical cut (incision) from a previous surgery. A ventral hernia repair is often done soon after diagnosis to stop the hernia from getting bigger, becoming uncomfortable, or becoming an emergency. This surgery usually takes about 2 hours, but the time can vary greatly.  LET [facility] CARE PROVIDER KNOW ABOUT: Any allergies you have. All medicines you are taking, including steroids, vitamins, herbs, eye drops, creams, and over-the-counter medicines. Previous problems you or members of your family have had with the use of anesthetics. Any blood disorders you have. Previous surgeries you have had. Medical conditions you have.  RISKS AND COMPLICATIONS  Generally, Open ventral hernia repair is a safe procedure. However, as with any surgical procedure, problems can occur. Possible problems include: Bleeding. Trouble passing urine or having a bowel movement after the surgery. Infection. Pneumonia. Blood clots. Pain in the area of the hernia. A bulge in the area of the hernia that may be caused by a collection of fluid. Injury to intestines or other structures in the abdomen. Return of the hernia after surgery.  BEFORE THE PROCEDURE  You may need to have blood tests, urine tests, a chest X-ray, or an electrocardiogram done before the day of the surgery. Ask your health care provider about changing or stopping your regular medicines. This is especially important if you are taking diabetes medicines or blood thinners. You may need to wash with a special type of germ-killing soap. Do not eat or drink anything after midnight the night before the procedure or as directed by your health care provider. Make plans to have someone drive you home after the procedure.  PROCEDURE  Small monitors will be put on your body. They are used to check your heart, blood pressure, and oxygen level. An IV access tube will be put into a  vein in your hand or arm. Fluids and medicine will flow directly into your body through the IV tube. You will be given medicine that makes you go to sleep (general anesthetic). Your abdomen will be cleaned with a special soap to kill any germs on your skin. Once you are asleep, a moderate - large size incision will be made in your abdomen. The size of incision depends on how large your hernia is. Your surgeon puts the tissue or intestines that formed the hernia back in place. A screen-like patch (mesh) is used to close the hernia. This helps make the area stronger. Stitches, tacks, or staples are used to keep the mesh in place. Medicine and a bandage (dressing) or skin glue will be put over the incision.  AFTER THE PROCEDURE  You will stay in a recovery area until the anesthetic wears off. Your blood pressure and pulse will be checked often. You may be able to go home the same day or may need to stay in the hospital for 1-2 days after surgery. Your surgeon will decide when you can go home depending upon your recovery. You may feel some pain. You will be given medicine for pain. You will be urged to do breathing exercises that involve  taking deep breaths. This helps prevent a lung infection after a surgery. You may have to wear compression stockings while you are in the hospital. These stockings help keep blood clots from forming in your legs.   This information is not intended to replace advice given to you by your health care provider. Make sure you discuss any questions you have with your health care provider.   Document Released: 05/30/2012 Document Revised: 06/18/2013 Document Reviewed: 05/30/2012 Elsevier Interactive Patient Education [employer]

## 2023-07-17 NOTE — Progress Notes (Signed)
Outpatient Surgical Follow Up  07/17/2023  Heidi Hernandez is an 73 y.o. female.   Chief Complaint  Patient presents with   Follow-up    HPI: Heidi Hernandez is a 73 year old female hospice diaphragmatic hernia repair years ago.  She has done well.  She is not experiencing any dysphagia no pulmonary issues.  She does report a bulging within the abdominal wall.  Denies any fevers any chills or any pain. Does have extensive surgical history including open splenectomy, appendectomy, abdominal hysterectomy and also prior hernia repair addition to robotic phlegmatic  hernia pair  Past Medical History:  Diagnosis Date   Anxiety    Arthritis    Asthma    GERD (gastroesophageal reflux disease)    Headache    Migraines   Heart murmur    Hypertension    Idiopathic thrombocytopenia purpura (HCC)    Required splenectomy   MVP (mitral valve prolapse)    Neuromuscular disorder (HCC)    Restless Leg Syndrome   Pancreatitis April 2017 [facility] due to cholecystitis.    Pneumonia    06-2015    Past Surgical History:  Procedure Laterality Date   ABDOMINAL HYSTERECTOMY     APPENDECTOMY     CARPAL TUNNEL RELEASE Bilateral 11/19/2014   Procedure: CARPAL TUNNEL RELEASE;  Surgeon: Donato Heinz, MD;  Location: [facility] ORS;  Service: Orthopedics;  Laterality: Bilateral;   CESAREAN SECTION     times 2   CHOLECYSTECTOMY  09/29/2015   [facility]   COLONOSCOPY N/A 05/02/2016   Procedure: COLONOSCOPY;  Surgeon: Scot Jun, MD;  Location: [facility] ENDOSCOPY;  Service: Endoscopy;  Laterality: N/A;   ESOPHAGOGASTRODUODENOSCOPY (EGD) WITH PROPOFOL N/A 05/02/2016   Procedure: ESOPHAGOGASTRODUODENOSCOPY (EGD) WITH PROPOFOL;  Surgeon: Scot Jun, MD;  Location: [facility] ENDOSCOPY;  Service: Endoscopy;  Laterality: N/A;   ESOPHAGOGASTRODUODENOSCOPY (EGD) WITH PROPOFOL N/A 11/22/2017   Procedure: ESOPHAGOGASTRODUODENOSCOPY (EGD) WITH PROPOFOL;  Surgeon: Scot Jun, MD;  Location: [facility] ENDOSCOPY;  Service:  Endoscopy;  Laterality: N/A;   HERNIA REPAIR     times 3   INSERTION OF MESH  07/05/2022   Procedure: INSERTION OF MESH;  Surgeon: Leafy Ro, MD;  Location: [facility] ORS;  Service: General;;   JOINT REPLACEMENT     OVARIAN CYST SURGERY     SHOULDER ARTHROSCOPY WITH ROTATOR CUFF REPAIR     times 2   SPLENECTOMY, TOTAL     TOTAL SHOULDER ARTHROPLASTY Right 04/21/2017   Procedure: REVERSE SHOULDER ARTHROPLASTY;  Surgeon: Signa Kell, MD;  Location: [facility] ORS;  Service: Orthopedics;  Laterality: Right;   TRIGGER FINGER RELEASE Right 11/19/2014   Procedure: RELEASE TRIGGER FINGER/A-1 PULLEY;  Surgeon: Donato Heinz, MD;  Location: [facility] ORS;  Service: Orthopedics;  Laterality: Right;   TRIGGER FINGER RELEASE Right 08/12/2015   Procedure: RELEASE TRIGGER LONG FINGER;  Surgeon: Donato Heinz, MD;  Location: [facility] ORS;  Service: Orthopedics;  Laterality: Right;   XI ROBOTIC ASSISTED PARAESOPHAGEAL HERNIA REPAIR  07/05/2022   Procedure: XI ROBOTIC ASSISTED PARAESOPHAGEAL HERNIA REPAIR;  Surgeon: Leafy Ro, MD;  Location: [facility] ORS;  Service: General;;    Family History  Problem Relation Age of Onset   Hypertension Mother    Renal Disease Father        ESRD with HD    Social History:  reports that she quit smoking about 43 years ago. Her smoking use included cigarettes. She started smoking about 53 years ago. She has a 10 pack-year smoking history. She  has been exposed to tobacco smoke. She has never used smokeless tobacco. She reports that she does not currently use alcohol. She reports that she does not use drugs.  Allergies:  Allergies  Allergen Reactions   Alpha-Lipoic Acid Nausea Only   Augmentin [Amoxicillin-Pot Clavulanate] Nausea And Vomiting   Cat Dander     Other reaction(s): Not available   Dog Epithelium (Canis Lupus Familiaris)     Other reaction(s): Not available   Droperidol Other (See Comments)    "Locked jaw" Facial muscles locked "Locked jaw"   Molds & Smuts      Other reaction(s): Not available   Paroxetine Hcl Other (See Comments)    syncope Passed out    Tree Extract     Other reaction(s): Not available   Morphine Itching    Medications reviewed.    ROS Full ROS performed and is otherwise negative other than what is stated in HPI   BP (!) 160/74   Pulse 64   Temp 98 F (36.7 C)   Ht 5\' 4"  (1.626 m)   Wt 123 lb (55.8 kg)   SpO2 98%   BMI 21.11 kg/m   Physical Exam Vitals and nursing note reviewed. Exam conducted with a chaperone present.  Constitutional:      General: She is not in acute distress.    Appearance: Normal appearance. She is not ill-appearing or diaphoretic.  Cardiovascular:     Rate and Rhythm: Normal rate and regular rhythm.     Heart sounds: No murmur heard. Pulmonary:     Effort: Pulmonary effort is normal. No respiratory distress.     Breath sounds: Normal breath sounds. No stridor.  Abdominal:     General: Abdomen is flat. There is no distension.     Palpations: Abdomen is soft. There is no mass.     Tenderness: There is no guarding or rebound.     Hernia: A hernia is present.     Comments: 3 Centimeter reducible ventral hernia periumbilical region  Musculoskeletal:        General: No swelling or tenderness. Normal range of motion.     Cervical back: Normal range of motion and neck supple. No rigidity.  Skin:    General: Skin is warm and dry.     Capillary Refill: Capillary refill takes less than 2 seconds.  Neurological:     General: No focal deficit present.     Mental Status: She is alert and oriented to person, place, and time.  Psychiatric:        Mood and Affect: Mood normal.        Behavior: Behavior normal.        Thought Content: Thought content normal.        Judgment: Judgment normal.     Assessment/Plan: Recurrent ventral hernia.  Gust with the patient in detail about her disease process.  I definitely recommend repair with mesh.  Do think that we can do an open repair safely.   Procedure discussed with the patient detail.  Risk, benefits and possible complications including but not limited to: Bleeding, infection bowel injuries, recurrence chronic pain.  She understands and wished to proceed.  She wishes to perform this as soon as possible. Think that we have someavailable later this week.  Please note that I spent 30 minutes in this encounter including person reviewing medical record, placing orders, counseling the patient and performing documentation    Sterling Big, MD [facility] General Surgeon

## 2023-07-18 ENCOUNTER — Telehealth: Payer: Self-pay | Admitting: Surgery

## 2023-07-18 NOTE — Telephone Encounter (Signed)
Patient has been advised of Pre-Admission date/time, and Surgery date at [facility].  Surgery Date: 07/20/23 Preadmission Testing Date: 07/20/23 (patient to arrive 2 hrs early, arrival time 11:00 am) at [facility] 2nd floor for surgery.    Patient reminded to be NPO after midnight.

## 2023-07-19 ENCOUNTER — Telehealth: Payer: Self-pay | Admitting: Surgery

## 2023-07-19 NOTE — Telephone Encounter (Signed)
Patient's case was moved, she is now informed to arrive @ 9:00 am on 07/20/23 for surgery.

## 2023-07-20 ENCOUNTER — Ambulatory Visit: Payer: Medicare HMO | Admitting: Anesthesiology

## 2023-07-20 ENCOUNTER — Other Ambulatory Visit: Payer: Self-pay

## 2023-07-20 ENCOUNTER — Encounter: Payer: Self-pay | Admitting: Surgery

## 2023-07-20 ENCOUNTER — Ambulatory Visit
Admission: RE | Admit: 2023-07-20 | Discharge: 2023-07-20 | Disposition: A | Discharge status: Home / Self Care | Payer: Medicare HMO | Attending: Surgery | Admitting: Surgery

## 2023-07-20 ENCOUNTER — Encounter
Admission: RE | Disposition: A | Discharge status: Home / Self Care | Payer: Self-pay | Source: Home / Self Care | Attending: Surgery

## 2023-07-20 DIAGNOSIS — J45909 Unspecified asthma, uncomplicated: Secondary | ICD-10-CM | POA: Diagnosis not present

## 2023-07-20 DIAGNOSIS — K219 Gastro-esophageal reflux disease without esophagitis: Secondary | ICD-10-CM | POA: Insufficient documentation

## 2023-07-20 DIAGNOSIS — I10 Essential (primary) hypertension: Secondary | ICD-10-CM | POA: Diagnosis not present

## 2023-07-20 DIAGNOSIS — Z01810 Encounter for preprocedural cardiovascular examination: Secondary | ICD-10-CM | POA: Diagnosis not present

## 2023-07-20 DIAGNOSIS — K436 Other and unspecified ventral hernia with obstruction, without gangrene: Secondary | ICD-10-CM | POA: Diagnosis present

## 2023-07-20 DIAGNOSIS — Z87891 Personal history of nicotine dependence: Secondary | ICD-10-CM | POA: Diagnosis not present

## 2023-07-20 DIAGNOSIS — K439 Ventral hernia without obstruction or gangrene: Secondary | ICD-10-CM | POA: Diagnosis not present

## 2023-07-20 DIAGNOSIS — I341 Nonrheumatic mitral (valve) prolapse: Secondary | ICD-10-CM | POA: Insufficient documentation

## 2023-07-20 DIAGNOSIS — K430 Incisional hernia with obstruction, without gangrene: Secondary | ICD-10-CM | POA: Diagnosis not present

## 2023-07-20 DIAGNOSIS — Z8719 Personal history of other diseases of the digestive system: Secondary | ICD-10-CM

## 2023-07-20 DIAGNOSIS — K432 Incisional hernia without obstruction or gangrene: Secondary | ICD-10-CM

## 2023-07-20 HISTORY — PX: INSERTION OF MESH: SHX5868

## 2023-07-20 HISTORY — PX: VENTRAL HERNIA REPAIR: SHX424

## 2023-07-20 LAB — CBC
HCT: 40.6 % (ref 36.0–46.0)
Hemoglobin: 14.2 g/dL (ref 12.0–15.0)
MCH: 32.7 pg (ref 26.0–34.0)
MCHC: 35 g/dL (ref 30.0–36.0)
MCV: 93.5 fL (ref 80.0–100.0)
Platelets: 148 10*3/uL — ABNORMAL LOW (ref 150–400)
RBC: 4.34 MIL/uL (ref 3.87–5.11)
RDW: 13.9 % (ref 11.5–15.5)
WBC: 10.3 10*3/uL (ref 4.0–10.5)
nRBC: 0 % (ref 0.0–0.2)

## 2023-07-20 SURGERY — REPAIR, HERNIA, VENTRAL
Anesthesia: General | Site: Abdomen

## 2023-07-20 MED ORDER — PHENYLEPHRINE 80 MCG/ML (10ML) SYRINGE FOR IV PUSH (FOR BLOOD PRESSURE SUPPORT)
PREFILLED_SYRINGE | INTRAVENOUS | Status: DC | PRN
  Administered 2023-07-20: 80 ug via INTRAVENOUS

## 2023-07-20 MED ORDER — GABAPENTIN 300 MG PO CAPS
300.0000 mg | ORAL_CAPSULE | ORAL | Status: AC
  Administered 2023-07-20: 300 mg via ORAL

## 2023-07-20 MED ORDER — DEXAMETHASONE SODIUM PHOSPHATE 10 MG/ML IJ SOLN
INTRAMUSCULAR | Status: AC
  Filled 2023-07-20: qty 1

## 2023-07-20 MED ORDER — ROCURONIUM BROMIDE 100 MG/10ML IV SOLN
INTRAVENOUS | Status: DC | PRN
  Administered 2023-07-20: 10 mg via INTRAVENOUS
  Administered 2023-07-20: 30 mg via INTRAVENOUS
  Administered 2023-07-20: 10 mg via INTRAVENOUS

## 2023-07-20 MED ORDER — PROPOFOL 10 MG/ML IV BOLUS
INTRAVENOUS | Status: DC | PRN
  Administered 2023-07-20: 100 mg via INTRAVENOUS

## 2023-07-20 MED ORDER — ACETAMINOPHEN 500 MG PO TABS
ORAL_TABLET | ORAL | Status: AC
  Filled 2023-07-20: qty 2

## 2023-07-20 MED ORDER — EPHEDRINE SULFATE-NACL 50-0.9 MG/10ML-% IV SOSY
PREFILLED_SYRINGE | INTRAVENOUS | Status: DC | PRN
  Administered 2023-07-20: 10 mg via INTRAVENOUS
  Administered 2023-07-20: 5 mg via INTRAVENOUS
  Administered 2023-07-20: 10 mg via INTRAVENOUS

## 2023-07-20 MED ORDER — OXYCODONE HCL 5 MG PO TABS
5.0000 mg | ORAL_TABLET | Freq: Once | ORAL | Status: AC | PRN
  Administered 2023-07-20: 5 mg via ORAL

## 2023-07-20 MED ORDER — SUGAMMADEX SODIUM 200 MG/2ML IV SOLN
INTRAVENOUS | Status: DC | PRN
  Administered 2023-07-20: 200 mg via INTRAVENOUS

## 2023-07-20 MED ORDER — GABAPENTIN 300 MG PO CAPS
ORAL_CAPSULE | ORAL | Status: AC
  Filled 2023-07-20: qty 1

## 2023-07-20 MED ORDER — CELECOXIB 200 MG PO CAPS
ORAL_CAPSULE | ORAL | Status: AC
  Filled 2023-07-20: qty 1

## 2023-07-20 MED ORDER — CHLORHEXIDINE GLUCONATE CLOTH 2 % EX PADS: 6.0000 | MEDICATED_PAD | Freq: Once | CUTANEOUS | Status: DC

## 2023-07-20 MED ORDER — KETAMINE HCL 10 MG/ML IJ SOLN
INTRAMUSCULAR | Status: DC | PRN
  Administered 2023-07-20: 20 mg via INTRAVENOUS

## 2023-07-20 MED ORDER — DEXAMETHASONE SODIUM PHOSPHATE 10 MG/ML IJ SOLN
INTRAMUSCULAR | Status: DC | PRN
  Administered 2023-07-20: 4 mg via INTRAVENOUS

## 2023-07-20 MED ORDER — CEFAZOLIN SODIUM-DEXTROSE 2-4 GM/100ML-% IV SOLN
INTRAVENOUS | Status: AC
  Filled 2023-07-20: qty 100

## 2023-07-20 MED ORDER — CELECOXIB 200 MG PO CAPS
200.0000 mg | ORAL_CAPSULE | ORAL | Status: AC
  Administered 2023-07-20: 200 mg via ORAL

## 2023-07-20 MED ORDER — BUPIVACAINE LIPOSOME 1.3 % IJ SUSP
INTRAMUSCULAR | Status: AC
  Filled 2023-07-20: qty 20

## 2023-07-20 MED ORDER — CHLORHEXIDINE GLUCONATE 0.12 % MT SOLN
15.0000 mL | Freq: Once | OROMUCOSAL | Status: AC
  Administered 2023-07-20: 15 mL via OROMUCOSAL

## 2023-07-20 MED ORDER — PROPOFOL 10 MG/ML IV BOLUS
INTRAVENOUS | Status: AC
  Filled 2023-07-20: qty 40

## 2023-07-20 MED ORDER — ACETAMINOPHEN 500 MG PO TABS
1000.0000 mg | ORAL_TABLET | ORAL | Status: AC
  Administered 2023-07-20: 1000 mg via ORAL

## 2023-07-20 MED ORDER — BUPIVACAINE-EPINEPHRINE (PF) 0.25% -1:200000 IJ SOLN
INTRAMUSCULAR | Status: DC | PRN
  Administered 2023-07-20: 30 mL

## 2023-07-20 MED ORDER — LIDOCAINE HCL (CARDIAC) PF 100 MG/5ML IV SOSY
PREFILLED_SYRINGE | INTRAVENOUS | Status: DC | PRN
  Administered 2023-07-20: 60 mg via INTRAVENOUS

## 2023-07-20 MED ORDER — FENTANYL CITRATE (PF) 100 MCG/2ML IJ SOLN
INTRAMUSCULAR | Status: AC
  Filled 2023-07-20: qty 2

## 2023-07-20 MED ORDER — ROCURONIUM BROMIDE 10 MG/ML (PF) SYRINGE
PREFILLED_SYRINGE | INTRAVENOUS | Status: AC
  Filled 2023-07-20: qty 10

## 2023-07-20 MED ORDER — BUPIVACAINE LIPOSOME 1.3 % IJ SUSP
INTRAMUSCULAR | Status: DC | PRN
  Administered 2023-07-20: 20 mL

## 2023-07-20 MED ORDER — LIDOCAINE HCL (PF) 2 % IJ SOLN
INTRAMUSCULAR | Status: AC
  Filled 2023-07-20: qty 5

## 2023-07-20 MED ORDER — ONDANSETRON HCL 4 MG/2ML IJ SOLN
INTRAMUSCULAR | Status: AC
  Filled 2023-07-20: qty 2

## 2023-07-20 MED ORDER — OXYCODONE HCL 5 MG/5ML PO SOLN: 5.0000 mg | Freq: Once | ORAL | Status: AC | PRN

## 2023-07-20 MED ORDER — FENTANYL CITRATE (PF) 100 MCG/2ML IJ SOLN
INTRAMUSCULAR | Status: DC | PRN
  Administered 2023-07-20: 50 ug via INTRAVENOUS

## 2023-07-20 MED ORDER — FENTANYL CITRATE (PF) 100 MCG/2ML IJ SOLN
25.0000 ug | INTRAMUSCULAR | Status: DC | PRN
  Administered 2023-07-20: 50 ug via INTRAVENOUS

## 2023-07-20 MED ORDER — HYDROCODONE-ACETAMINOPHEN 5-325 MG PO TABS: 1.0000 | ORAL_TABLET | Freq: Four times a day (QID) | ORAL | 0 refills | Status: DC | PRN

## 2023-07-20 MED ORDER — KETAMINE HCL 50 MG/5ML IJ SOSY
PREFILLED_SYRINGE | INTRAMUSCULAR | Status: AC
  Filled 2023-07-20: qty 5

## 2023-07-20 MED ORDER — OXYCODONE HCL 5 MG PO TABS
ORAL_TABLET | ORAL | Status: AC
  Filled 2023-07-20: qty 1

## 2023-07-20 MED ORDER — 0.9 % SODIUM CHLORIDE (POUR BTL) OPTIME
TOPICAL | Status: DC | PRN
  Administered 2023-07-20: 120 mL

## 2023-07-20 MED ORDER — CHLORHEXIDINE GLUCONATE CLOTH 2 % EX PADS
6.0000 | MEDICATED_PAD | Freq: Once | CUTANEOUS | Status: AC
  Administered 2023-07-20: 6 via TOPICAL

## 2023-07-20 MED ORDER — BUPIVACAINE-EPINEPHRINE (PF) 0.25% -1:200000 IJ SOLN
INTRAMUSCULAR | Status: AC
  Filled 2023-07-20: qty 30

## 2023-07-20 MED ORDER — GLYCOPYRROLATE 0.2 MG/ML IJ SOLN
INTRAMUSCULAR | Status: DC | PRN
  Administered 2023-07-20: .2 mg via INTRAVENOUS

## 2023-07-20 MED ORDER — CEFAZOLIN SODIUM-DEXTROSE 2-4 GM/100ML-% IV SOLN
2.0000 g | INTRAVENOUS | Status: AC
  Administered 2023-07-20: 2 g via INTRAVENOUS

## 2023-07-20 MED ORDER — CHLORHEXIDINE GLUCONATE 0.12 % MT SOLN
OROMUCOSAL | Status: AC
  Filled 2023-07-20: qty 15

## 2023-07-20 MED ORDER — ORAL CARE MOUTH RINSE: 15.0000 mL | Freq: Once | OROMUCOSAL | Status: AC

## 2023-07-20 MED ORDER — LACTATED RINGERS IV SOLN: INTRAVENOUS | Status: DC

## 2023-07-20 SURGICAL SUPPLY — 27 items
APPLIER CLIP 11 MED OPEN (CLIP)
APPLIER CLIP 13 LRG OPEN (CLIP)
CLIP APPLIE 11 MED OPEN (CLIP) ×1 IMPLANT
CLIP APPLIE 13 LRG OPEN (CLIP) ×1 IMPLANT
DERMABOND ADVANCED .7 DNX12 (GAUZE/BANDAGES/DRESSINGS) IMPLANT
DRAPE LAPAROTOMY 100X77 ABD (DRAPES) ×1 IMPLANT
DRSG TELFA 3X8 NADH STRL (GAUZE/BANDAGES/DRESSINGS) ×1 IMPLANT
ELECT CAUTERY BLADE 6.4 (BLADE) ×1 IMPLANT
ELECT REM PT RETURN 9FT ADLT (ELECTROSURGICAL) ×1
ELECTRODE REM PT RTRN 9FT ADLT (ELECTROSURGICAL) ×1 IMPLANT
GLOVE BIO SURGEON STRL SZ7 (GLOVE) ×1 IMPLANT
GOWN STRL REUS W/ TWL LRG LVL3 (GOWN DISPOSABLE) ×2 IMPLANT
MANIFOLD NEPTUNE II (INSTRUMENTS) ×1 IMPLANT
MESH VENTRALEX ST 2.5 CRC MED (Mesh General) IMPLANT
NDL FRENCH SPRG EYE 1/2 CRC (NEEDLE) IMPLANT
NDL HYPO 22X1.5 SAFETY MO (MISCELLANEOUS) ×1 IMPLANT
NEEDLE HYPO 22X1.5 SAFETY MO (MISCELLANEOUS) ×1
PACK BASIN MINOR ARMC (MISCELLANEOUS) ×1 IMPLANT
SPONGE T-LAP 18X18 ~~LOC~~+RFID (SPONGE) ×1 IMPLANT
STAPLER SKIN PROX 35W (STAPLE) ×1 IMPLANT
SUT ETHIBOND 0 MO6 C/R (SUTURE) ×1 IMPLANT
SUT MNCRL 4-0 27 PS-2 XMFL (SUTURE) ×1
SUT MNCRL 4-0 27XMFL (SUTURE) ×1
SUT VIC AB 2-0 SH 27XBRD (SUTURE) ×2 IMPLANT
SUTURE MNCRL 4-0 27XMF (SUTURE) IMPLANT
SYR 20ML LL LF (SYRINGE) ×1 IMPLANT
TRAP FLUID SMOKE EVACUATOR (MISCELLANEOUS) ×1 IMPLANT

## 2023-07-20 NOTE — Discharge Instructions (Addendum)
Open Hernia Repair, Adult, Care After After an open hernia repair, it's common to have: Pain in your belly. Slight bruising. A little swelling. A small amount of blood from the cut from surgery. Follow these instructions at home: Medicines Take your medicines only as told by your health care provider. If told, take steps to prevent trouble pooping. You may need to: Drink enough fluid to keep your pee (urine) pale yellow. Take medicines to help you poop. Eat foods high in fiber. These include beans, whole grains, and fresh fruits and vegetables. Ask your provider if you should avoid driving or using machines while you're taking your medicine. If you were given antibiotics, take them as told by your provider. Do not stop taking them even if you start to feel better. Incision care  Take care of your cut from surgery as told by your provider. Make sure you: Wash your hands with soap and water for at least 20 seconds before and after you change your bandage. If you can't use soap and water, use hand sanitizer. Change your bandage. Leave stitches or skin glue in place for at least 2 weeks. Leave tape strips alone unless you're told to take them off. You may trim the edges of the tape strips if they curl up. Check the area around your cut every day for signs of infection. Check for: More redness, swelling, or pain. More fluid or blood. Warmth. Pus or a bad smell. Wear loose clothes while your cut heals. Activity Rest as told. You may have to avoid lifting. Ask how much weight you can safely lift. Do not play contact sports until your provider says it's safe. Return to normal activities when you're told. Ask what things are safe for you to do. General instructions If you were given a sedative during your surgery, do not drive or use machines until you're told it's safe. A sedative can make you sleepy. Do not take baths, swim, or use a hot tub until told. Ask if showers are okay. You may  need to take sponge baths only. Hold a pillow over your belly when you cough or sneeze. This helps with pain. Do not smoke, vape, or use products with nicotine or tobacco in them. If you need help quitting, talk with your provider. Your provider may give you more instructions. Make sure you know what you can and can't do. Contact a health care provider if: You have signs of infection. You have a fever or chills. You have blood in your poop. You haven't pooped in 2-3 days. Your pain doesn't get better with medicine. Get help right away if: You have chest pain. You're short of breath. You feel faint or light-headed. You have very bad pain. You start to vomit. You have pain, swelling, or redness in a leg. These symptoms may be an emergency. Call 911 right away. Do not wait to see if the symptoms will go away. Do not drive yourself to the hospital. This information is not intended to replace advice given to you by your health care provider. Make sure you discuss any questions you have with your health care provider. Document Revised: 03/16/2023 Document Reviewed: 09/06/2022 Elsevier Patient Education  2024 [employer].

## 2023-07-20 NOTE — Op Note (Signed)
  Ventral Hernia Repair with Mesh 6.4 cms Ventralex  Pre-operative Diagnosis:  ventral hernia  Post-operative Diagnosis: same  Surgeon: Sterling Big, MD FACS  Anesthesia: Gen. with endotracheal tube   Findings:  3cms  chronically incarcerated hernia  Estimated Blood Loss: 10cc                Specimens: sac          Complications: none              Procedure Details  The patient was seen again in the Holding Room. The benefits, complications, treatment options, and expected outcomes were discussed with the patient. The risks of bleeding, infection, recurrence of symptoms, failure to resolve symptoms, bowel injury, mesh placement, mesh infection, any of which could require further surgery were reviewed with the patient. The likelihood of improving the patient's symptoms with return to their baseline status is good.  The patient and/or family concurred with the proposed plan, giving informed consent.  The patient was taken to Operating Room, identified and the procedure verified.  A Time Out was held and the above information confirmed.  Prior to the induction of general anesthesia, antibiotic prophylaxis was administered. VTE prophylaxis was in place. General endotracheal anesthesia was then administered and tolerated well. After the induction, the abdomen was prepped with Chloraprep and draped in the sterile fashion. The patient was positioned in the supine position.  Incision was created with a scalpel over the hernia defect. Electrocautery was used to dissect through subcutaneous tissue, the hernia sac was opened excised.Omentum was found chronically incarcerated and was reduced back to the abdominal cavity. We were able to clean the fascial edges and lyse some omental adhesions that were attached the abdominal wall, no bowel injuries observed. liposomal Marcaine was used to injected bilaterally to perform TAP Block under bidigital guidance.  The hernia was measured. A BARD 6.4cms  Ventralex  patch selected and placed in an underlay fashion . I used 6 transfacial sutures to  secure to the fascia circumferentially. The mesh laid very nicely against the abdominal wall w/o any kinks or wrinkles. I closed the hernia defect with interrupted 0 Ethibond sutures.   Incision was closed in a 2 layer fashion with 3-0 Vicryl and 4-0 Monocryl. Dermabond was used to coat the skin. Patient tolerated procedure well and there were no immediate complications. Needle and laparotomy counts were correct

## 2023-07-20 NOTE — Interval H&P Note (Signed)
History and Physical Interval Note:  07/20/2023 10:10 AM  Heidi Hernandez  has presented today for surgery, with the diagnosis of recurrent 3 cm ventral hernia reducible.  The various methods of treatment have been discussed with the patient and family. After consideration of risks, benefits and other options for treatment, the patient has consented to  Procedure(s): HERNIA REPAIR VENTRAL ADULT, open, RNFA to assist (N/A) as a surgical intervention.  The patient's history has been reviewed, patient examined, no change in status, stable for surgery.  I have reviewed the patient's chart and labs.  Questions were answered to the patient's satisfaction.     Lofton Leon F Chandelle Harkey

## 2023-07-20 NOTE — Anesthesia Preprocedure Evaluation (Signed)
Anesthesia Evaluation  Patient identified by MRN, date of birth, ID band Patient awake    Reviewed: Allergy & Precautions, NPO status , Patient's Chart, lab work & pertinent test results  History of Anesthesia Complications Negative for: history of anesthetic complications  Airway Mallampati: III  TM Distance: <3 FB Neck ROM: full    Dental  (+) Chipped   Pulmonary neg shortness of breath, asthma , former smoker   Pulmonary exam normal        Cardiovascular Exercise Tolerance: Good hypertension, (-) angina Normal cardiovascular exam+ Valvular Problems/Murmurs      Neuro/Psych  Headaches  Neuromuscular disease  negative psych ROS   GI/Hepatic Neg liver ROS,GERD  Controlled,,  Endo/Other  negative endocrine ROS    Renal/GU      Musculoskeletal   Abdominal   Peds  Hematology negative hematology ROS (+)   Anesthesia Other Findings Past Medical History: No date: Anxiety No date: Arthritis No date: Asthma No date: GERD (gastroesophageal reflux disease) No date: Headache     Comment:  Migraines No date: Heart murmur No date: Hypertension No date: Idiopathic thrombocytopenia purpura (HCC)     Comment:  Required splenectomy No date: MVP (mitral valve prolapse) No date: Neuromuscular disorder [facility])     Comment:  Restless Leg Syndrome April 2017 [facility]: Pancreatitis     Comment:  [facility] due to cholecystitis.  No date: Pneumonia     Comment:  06-2015  Past Surgical History: No date: ABDOMINAL HYSTERECTOMY No date: APPENDECTOMY 11/19/2014: CARPAL TUNNEL RELEASE; Bilateral     Comment:  Procedure: CARPAL TUNNEL RELEASE;  Surgeon: Donato Heinz, MD;  Location: [facility] ORS;  Service: Orthopedics;                Laterality: Bilateral; No date: CESAREAN SECTION     Comment:  times 2 09/29/2015: CHOLECYSTECTOMY     Comment:  [facility] 05/02/2016: COLONOSCOPY; N/A     Comment:  Procedure: COLONOSCOPY;  Surgeon:  Scot Jun, MD;               Location: [facility] ENDOSCOPY;  Service: Endoscopy;                Laterality: N/A; 05/02/2016: ESOPHAGOGASTRODUODENOSCOPY (EGD) WITH PROPOFOL; N/A     Comment:  Procedure: ESOPHAGOGASTRODUODENOSCOPY (EGD) WITH               PROPOFOL;  Surgeon: Scot Jun, MD;  Location: [facility]              ENDOSCOPY;  Service: Endoscopy;  Laterality: N/A; 11/22/2017: ESOPHAGOGASTRODUODENOSCOPY (EGD) WITH PROPOFOL; N/A     Comment:  Procedure: ESOPHAGOGASTRODUODENOSCOPY (EGD) WITH               PROPOFOL;  Surgeon: Scot Jun, MD;  Location:               [facility] ENDOSCOPY;  Service: Endoscopy;  Laterality: N/A; No date: HERNIA REPAIR     Comment:  times 3 07/05/2022: INSERTION OF MESH     Comment:  Procedure: INSERTION OF MESH;  Surgeon: Leafy Ro,               MD;  Location: [facility] ORS;  Service: General;; No date: JOINT REPLACEMENT No date: OVARIAN CYST SURGERY No date: SHOULDER ARTHROSCOPY WITH ROTATOR CUFF REPAIR     Comment:  times 2 No date: SPLENECTOMY, TOTAL 04/21/2017: TOTAL SHOULDER ARTHROPLASTY;  Right     Comment:  Procedure: REVERSE SHOULDER ARTHROPLASTY;  Surgeon:               Signa Kell, MD;  Location: [facility] ORS;  Service:               Orthopedics;  Laterality: Right; 11/19/2014: TRIGGER FINGER RELEASE; Right     Comment:  Procedure: RELEASE TRIGGER FINGER/A-1 PULLEY;  Surgeon:               Donato Heinz, MD;  Location: [facility] ORS;  Service:               Orthopedics;  Laterality: Right; 08/12/2015: TRIGGER FINGER RELEASE; Right     Comment:  Procedure: RELEASE TRIGGER LONG FINGER;  Surgeon: Donato Heinz, MD;  Location: [facility] ORS;  Service: Orthopedics;              Laterality: Right; 07/05/2022: XI ROBOTIC ASSISTED PARAESOPHAGEAL HERNIA REPAIR     Comment:  Procedure: XI ROBOTIC ASSISTED PARAESOPHAGEAL HERNIA               REPAIR;  Surgeon: Leafy Ro, MD;  Location: [facility]               ORS;  Service: General;;  BMI    Body  Mass Index: 20.43 kg/m      Reproductive/Obstetrics negative OB ROS                             Anesthesia Physical Anesthesia Plan  ASA: 3  Anesthesia Plan: General ETT   Post-op Pain Management:    Induction: Intravenous  PONV Risk Score and Plan: Ondansetron, Dexamethasone, Midazolam and Treatment may vary due to age or medical condition  Airway Management Planned: Oral ETT  Additional Equipment:   Intra-op Plan:   Post-operative Plan: Extubation in OR  Informed Consent: I have reviewed the patients History and Physical, chart, labs and discussed the procedure including the risks, benefits and alternatives for the proposed anesthesia with the patient or authorized representative who has indicated his/her understanding and acceptance.     Dental Advisory Given  Plan Discussed with: Anesthesiologist, CRNA and Surgeon  Anesthesia Plan Comments: (Patient consented for risks of anesthesia including but not limited to:  - adverse reactions to medications - damage to eyes, teeth, lips or other oral mucosa - nerve damage due to positioning  - sore throat or hoarseness - Damage to heart, brain, nerves, lungs, other parts of body or loss of life  Patient voiced understanding and assent.)       Anesthesia Quick Evaluation

## 2023-07-20 NOTE — Anesthesia Postprocedure Evaluation (Signed)
Anesthesia Post Note  Patient: Heidi Hernandez  Procedure(s) Performed: HERNIA REPAIR VENTRAL ADULT, open (Abdomen) INSERTION OF MESH (Abdomen)  Patient location during evaluation: PACU Anesthesia Type: General Level of consciousness: awake and alert Pain management: pain level controlled Vital Signs Assessment: post-procedure vital signs reviewed and stable Respiratory status: spontaneous breathing, nonlabored ventilation, respiratory function stable and patient connected to nasal cannula oxygen Cardiovascular status: blood pressure returned to baseline and stable Postop Assessment: no apparent nausea or vomiting Anesthetic complications: no   No notable events documented.   Last Vitals:  Vitals:   07/20/23 1342 07/20/23 1400  BP: 137/65 127/82  Pulse: 100 91  Resp: (!) 21 15  Temp: (!) 36.1 C (!) 36.3 C  SpO2: 99% 95%    Last Pain:  Vitals:   07/20/23 1400  TempSrc:   PainSc: 4                  Cleda Mccreedy Cataldo Cosgriff

## 2023-07-20 NOTE — Anesthesia Procedure Notes (Signed)
Procedure Name: Intubation Date/Time: 07/20/2023 12:32 PM  Performed by: Darrell Jewel I, CRNAPre-anesthesia Checklist: Patient identified, Emergency Drugs available, Suction available, Patient being monitored and Timeout performed Patient Re-evaluated:Patient Re-evaluated prior to induction Oxygen Delivery Method: Circle system utilized Preoxygenation: Pre-oxygenation with 100% oxygen Induction Type: IV induction Ventilation: Mask ventilation without difficulty Laryngoscope Size: McGrath and 3 Grade View: Grade I Tube type: Oral Tube size: 7.0 mm Number of attempts: 1 Airway Equipment and Method: Stylet and Oral airway Placement Confirmation: ETT inserted through vocal cords under direct vision, positive ETCO2 and breath sounds checked- equal and bilateral Secured at: 21 cm Tube secured with: Tape Dental Injury: Teeth and Oropharynx as per pre-operative assessment

## 2023-07-20 NOTE — Transfer of Care (Signed)
Immediate Anesthesia Transfer of Care Note  Patient: Heidi Hernandez  Procedure(s) Performed: HERNIA REPAIR VENTRAL ADULT, open (Abdomen) INSERTION OF MESH (Abdomen)  Patient Location: PACU  Anesthesia Type:General  Level of Consciousness: awake and alert   Airway & Oxygen Therapy: Patient Spontanous Breathing and Patient connected to face mask oxygen  Post-op Assessment: Report given to RN and Post -op Vital signs reviewed and stable  Post vital signs: stable  Last Vitals:  Vitals Value Taken Time  BP 137/65 07/20/23 1342  Temp 97   Pulse 98 07/20/23 1345  Resp 21 07/20/23 1345  SpO2 99 % 07/20/23 1345  Vitals shown include unfiled device data.  Last Pain:  Vitals:   07/20/23 1040  TempSrc: Temporal  PainSc: 0-No pain         Complications: No notable events documented.

## 2023-07-21 ENCOUNTER — Encounter: Payer: Self-pay | Admitting: Surgery

## 2023-07-21 LAB — SURGICAL PATHOLOGY

## 2023-07-31 ENCOUNTER — Ambulatory Visit (INDEPENDENT_AMBULATORY_CARE_PROVIDER_SITE_OTHER): Payer: Medicare HMO | Admitting: Surgery

## 2023-07-31 ENCOUNTER — Encounter: Payer: Self-pay | Admitting: Surgery

## 2023-07-31 VITALS — BP 158/68 | HR 71 | Temp 98.0°F | Ht 64.0 in | Wt 121.2 lb

## 2023-07-31 DIAGNOSIS — Z09 Encounter for follow-up examination after completed treatment for conditions other than malignant neoplasm: Secondary | ICD-10-CM | POA: Diagnosis not present

## 2023-07-31 DIAGNOSIS — K430 Incisional hernia with obstruction, without gangrene: Secondary | ICD-10-CM

## 2023-07-31 NOTE — Progress Notes (Signed)
Outpatient Surgical Follow Up  07/31/2023  Heidi Hernandez is an 73 y.o. female.   Chief Complaint  Patient presents with   Routine Post Op    Ventral hernia repair     HPI: Is 10 days out from ventral hernia repair with mesh.  She initially had some constipation that then turned to diarrhea after having a few doses of MiraLAX.  Feels a bit weak.  She is tolerating diet.  No fevers no chills she is ambulating without assistance.  She is accompanied by her husband. She is slowly improving  Past Medical History:  Diagnosis Date   Anxiety    Arthritis    Asthma    GERD (gastroesophageal reflux disease)    Headache    Migraines   Heart murmur    Hypertension    Idiopathic thrombocytopenia purpura (HCC)    Required splenectomy   MVP (mitral valve prolapse)    Neuromuscular disorder (HCC)    Restless Leg Syndrome   Pancreatitis April 2017 [facility] due to cholecystitis.    Pneumonia    06-2015    Past Surgical History:  Procedure Laterality Date   ABDOMINAL HYSTERECTOMY     APPENDECTOMY     CARPAL TUNNEL RELEASE Bilateral 11/19/2014   Procedure: CARPAL TUNNEL RELEASE;  Surgeon: Donato Heinz, MD;  Location: [facility] ORS;  Service: Orthopedics;  Laterality: Bilateral;   CESAREAN SECTION     times 2   CHOLECYSTECTOMY  09/29/2015   [facility]   COLONOSCOPY N/A 05/02/2016   Procedure: COLONOSCOPY;  Surgeon: Scot Jun, MD;  Location[facility] ENDOSCOPY;  Service: Endoscopy;  Laterality: N/A;   ESOPHAGOGASTRODUODENOSCOPY (EGD) WITH PROPOFOL N/A 05/02/2016   Procedure: ESOPHAGOGASTRODUODENOSCOPY (EGD) WITH PROPOFOL;  Surgeon: Scot Jun, MD;  Location: [facility] ENDOSCOPY;  Service: Endoscopy;  Laterality: N/A;   ESOPHAGOGASTRODUODENOSCOPY (EGD) WITH PROPOFOL N/A 11/22/2017   Procedure: ESOPHAGOGASTRODUODENOSCOPY (EGD) WITH PROPOFOL;  Surgeon: Scot Jun, MD;  Location: [facility] ENDOSCOPY;  Service: Endoscopy;  Laterality: N/A;   HERNIA REPAIR     times 3   INSERTION OF MESH   07/05/2022   Procedure: INSERTION OF MESH;  Surgeon: Leafy Ro, MD;  Location: [facility] ORS;  Service: General;;   INSERTION OF MESH N/A 07/20/2023   Procedure: INSERTION OF MESH;  Surgeon: Leafy Ro, MD;  Location: [facility] ORS;  Service: General;  Laterality: N/A;   JOINT REPLACEMENT     OVARIAN CYST SURGERY     SHOULDER ARTHROSCOPY WITH ROTATOR CUFF REPAIR     times 2   SPLENECTOMY, TOTAL     TOTAL SHOULDER ARTHROPLASTY Right 04/21/2017   Procedure: REVERSE SHOULDER ARTHROPLASTY;  Surgeon: Signa Kell, MD;  Location: [facility] ORS;  Service: Orthopedics;  Laterality: Right;   TRIGGER FINGER RELEASE Right 11/19/2014   Procedure: RELEASE TRIGGER FINGER/A-1 PULLEY;  Surgeon: Donato Heinz, MD;  Location: [facility] ORS;  Service: Orthopedics;  Laterality: Right;   TRIGGER FINGER RELEASE Right 08/12/2015   Procedure: RELEASE TRIGGER LONG FINGER;  Surgeon: Donato Heinz, MD;  Location: [facility] ORS;  Service: Orthopedics;  Laterality: Right;   VENTRAL HERNIA REPAIR N/A 07/20/2023   Procedure: HERNIA REPAIR VENTRAL ADULT, open;  Surgeon: Leafy Ro, MD;  Location: [facility] ORS;  Service: General;  Laterality: N/A;   XI ROBOTIC ASSISTED PARAESOPHAGEAL HERNIA REPAIR  07/05/2022   Procedure: XI ROBOTIC ASSISTED PARAESOPHAGEAL HERNIA REPAIR;  Surgeon: Leafy Ro, MD;  Location: [facility] ORS;  Service: General;;    Family History  Problem Relation Age of Onset   Hypertension Mother    Renal Disease Father        ESRD with HD    Social History:  reports that she quit smoking about 43 years ago. Her smoking use included cigarettes. She started smoking about 53 years ago. She has a 10 pack-year smoking history. She has been exposed to tobacco smoke. She has never used smokeless tobacco. She reports that she does not currently use alcohol. She reports that she does not use drugs.  Allergies:  Allergies  Allergen Reactions   Alpha-Lipoic Acid Nausea Only   Augmentin [Amoxicillin-Pot Clavulanate] Nausea And  Vomiting   Cat Dander     Other reaction(s): Not available   Dog Epithelium (Canis Lupus Familiaris)     Other reaction(s): Not available   Droperidol Other (See Comments)    "Locked jaw" Facial muscles locked "Locked jaw"   Molds & Smuts     Other reaction(s): Not available   Paroxetine Hcl Other (See Comments)    syncope Passed out    Tree Extract     Other reaction(s): Not available   Morphine Itching    Medications reviewed.    ROS Full ROS performed and is otherwise negative other than what is stated in HPI   BP (!) 158/68   Pulse 71   Temp 98 F (36.7 C) (Oral)   Ht 5\' 4"  (1.626 m)   Wt 121 lb 3.2 oz (55 kg)   SpO2 98%   BMI 20.80 kg/m   Physical Exam Vitals and nursing note reviewed. Exam conducted with a chaperone present.  Constitutional:      Appearance: Normal appearance. She is not ill-appearing.  Pulmonary:     Effort: Pulmonary effort is normal.     Breath sounds: No stridor.  Abdominal:     General: Abdomen is flat. There is no distension.     Palpations: Abdomen is soft. There is no mass.     Tenderness: There is no abdominal tenderness. There is no guarding.     Hernia: No hernia is present.     Comments: Incision c/d/I, no infection or hematomas. No peritonitis  Musculoskeletal:        General: No swelling or deformity. Normal range of motion.  Skin:    General: Skin is warm and dry.     Capillary Refill: Capillary refill takes less than 2 seconds.  Neurological:     General: No focal deficit present.     Mental Status: She is alert and oriented to person, place, and time.  Psychiatric:        Mood and Affect: Mood normal.        Behavior: Behavior normal.        Thought Content: Thought content normal.        Judgment: Judgment normal.     Assessment/Plan: Doing well without surgical complications.  She does have a degree of IBS that I think gets affected every time she receives general anesthetic. Reassured patient about benign  findings. Sinew lifting restrictions RTC prn  Sterling Big, MD [facility] General Surgeon

## 2023-07-31 NOTE — Patient Instructions (Addendum)
GENERAL POST-OPERATIVE PATIENT INSTRUCTIONS   WOUND CARE INSTRUCTIONS:  Keep a dry clean dressing on the wound if there is drainage. The initial bandage may be removed after 24 hours.  Once the wound has quit draining you may leave it open to air.  If clothing rubs against the wound or causes irritation and the wound is not draining you may cover it with a dry dressing during the daytime.  Try to keep the wound dry and avoid ointments on the wound unless directed to do so.  If the wound becomes bright red and painful or starts to drain infected material that is not clear, please contact your physician immediately.  If the wound is mildly pink and has a thick firm ridge underneath it, this is normal, and is referred to as a healing ridge.  This will resolve over the next 4-6 weeks.  BATHING: You may shower if you have been informed of this by your surgeon. However, Please do not submerge in a tub, hot tub, or pool until incisions are completely sealed or have been told by your surgeon that you may do so.  DIET:  You may eat any foods that you can tolerate.  It is a good idea to eat a high fiber diet and take in plenty of fluids to prevent constipation.  If you do become constipated you may want to take a mild laxative or take ducolax tablets on a daily basis until your bowel habits are regular.  Constipation can be very uncomfortable, along with straining, after recent surgery.  ACTIVITY:  You are encouraged to cough and deep breath or use your incentive spirometer if you were given one, every 15-30 minutes when awake.  This will help prevent respiratory complications and low grade fevers post-operatively if you had a general anesthetic.  You may want to hug a pillow when coughing and sneezing to add additional support to the surgical area, if you had abdominal or chest surgery, which will decrease pain during these times.  You are encouraged to walk and engage in light activity for the next two weeks.  You  should not lift more than 20 pounds for 6 weeks total after surgery as it could put you at increased risk for complications.  Twenty pounds is roughly equivalent to a plastic bag of groceries. At that time- Listen to your body when lifting, if you have pain when lifting, stop and then try again in a few days. Soreness after doing exercises or activities of daily living is normal as you get back in to your normal routine.  MEDICATIONS:  Try to take narcotic medications and anti-inflammatory medications, such as tylenol, ibuprofen, naprosyn, etc., with food.  This will minimize stomach upset from the medication.  Should you develop nausea and vomiting from the pain medication, or develop a rash, please discontinue the medication and contact your physician.  You should not drive, make important decisions, or operate machinery when taking narcotic pain medication.  SUNBLOCK Use sun block to incision area over the next year if this area will be exposed to sun. This helps decrease scarring and will allow you avoid a permanent darkened area over your incision.  QUESTIONS:  Please feel free to call our office if you have any questions, and we will be glad to assist you. [phone]    Chronic Diarrhea Chronic diarrhea is when a person passes frequent loose and sometimes watery stools for 4 weeks or longer. Non-chronic diarrhea usually lasts for only 2-3 days.  Diarrhea can cause a person to feel weak and become dehydrated. Dehydration is a condition in which there is not enough water or other fluids in the body. Dehydration can make the person tired and thirsty. It can also cause a dry mouth, decreased urination, and dark yellow urine. Diarrhea is a sign of an underlying problem, such as: Infection. Side effects of medicines. Problems digesting something in your diet, such as milk products if you have lactose intolerance. Conditions such as celiac disease, irritable bowel syndrome (IBS), or inflammatory  bowel disease (IBD). If you have chronic diarrhea, make sure you treat it as told by your health care provider. Follow these instructions at home: Medicines Take over-the-counter and prescription medicines only as told by your health care provider. If you were prescribed antibiotics, take them as told by your health care provider. Do not stop taking the antibiotic even if you start to feel better. Eating and drinking  Follow instructions from your health care provider about what to eat and drink. You may have to: Avoid foods that trigger diarrhea for you. Take an oral rehydration solution (ORS). This is a drink that keeps you hydrated. It can be found at pharmacies and retail stores. Drink clear fluids, such as water, diluted fruit juice, and low-calorie sports drinks. You can also get fluids by sucking on ice chips. Drink enough fluid to keep your urine pale yellow. This will help you avoid dehydration. Eat small amounts of bland foods that are easy to digest as you are able. These foods include bananas, applesauce, rice, lean meats, toast, and crackers. Avoid spicy or fatty foods. Avoid foods and drinks that contain a lot of sugar or caffeine. Do not drink alcohol if: Your health care provider tells you not to drink. You are pregnant, may be pregnant, or are planning to become pregnant. If you drink alcohol: Limit how much you have to: 0-1 drink a day for women. 0-2 drinks a day for men. Know how much alcohol is in your drink. In the [place] one drink equals one 12 oz bottle of beer (355 mL), one 5 oz glass of wine (148 mL), or one 1 oz glass of hard liquor (44 mL). General instructions  Wash your hands often and after each diarrhea episode for at least 20 seconds. Use soap and water. If soap and water are not available, use hand sanitizer. Make sure that all people in your household wash their hands well and often. Rest as told by your health care provider. Take a warm bath to relieve  any burning or pain from frequent diarrhea episodes. Watch your condition for any changes. Contact a health care provider if: You have a fever. Your diarrhea gets worse or does not get better. You have new symptoms. You vomit every time you eat or drink. You feel light-headed or dizzy. You have muscle cramps. You have severe pain in the rectum. You have signs of dehydration, such as: Dark urine, very little urine, or no urine. Cracked lips. Dry mouth. Sunken eyes. Sleepiness. Weakness. You have bloody or black stools, or stools that look like tar. You have severe pain, cramping, or bloating in your abdomen, or pain that stays in one place. Your skin feels cold and clammy. You feel confused. You have a severe headache. Get help right away if: You have chest pain or your heart is beating very quickly. You have trouble breathing or you are breathing very quickly. You feel extremely weak or you faint. These symptoms may  be an emergency. Get help right away. Call 911. Do not wait to see if the symptoms will go away. Do not drive yourself to the hospital. This information is not intended to replace advice given to you by your health care provider. Make sure you discuss any questions you have with your health care provider. Document Revised: 11/30/2021 Document Reviewed: 11/30/2021 Elsevier Patient Education  2024 [employer].

## 2023-08-03 DIAGNOSIS — I10 Essential (primary) hypertension: Secondary | ICD-10-CM | POA: Diagnosis not present

## 2023-08-03 DIAGNOSIS — I341 Nonrheumatic mitral (valve) prolapse: Secondary | ICD-10-CM | POA: Diagnosis not present

## 2023-08-03 DIAGNOSIS — I77810 Thoracic aortic ectasia: Secondary | ICD-10-CM | POA: Diagnosis not present

## 2023-08-07 DIAGNOSIS — M11831 Other specified crystal arthropathies, right wrist: Secondary | ICD-10-CM | POA: Diagnosis not present

## 2023-08-08 DIAGNOSIS — M11832 Other specified crystal arthropathies, left wrist: Secondary | ICD-10-CM | POA: Diagnosis not present

## 2023-08-08 DIAGNOSIS — M11831 Other specified crystal arthropathies, right wrist: Secondary | ICD-10-CM | POA: Diagnosis not present

## 2023-08-22 DIAGNOSIS — M11832 Other specified crystal arthropathies, left wrist: Secondary | ICD-10-CM | POA: Diagnosis not present

## 2023-08-22 DIAGNOSIS — M11831 Other specified crystal arthropathies, right wrist: Secondary | ICD-10-CM | POA: Diagnosis not present

## 2023-08-24 DIAGNOSIS — G2581 Restless legs syndrome: Secondary | ICD-10-CM | POA: Diagnosis not present

## 2023-08-24 DIAGNOSIS — R200 Anesthesia of skin: Secondary | ICD-10-CM | POA: Diagnosis not present

## 2023-08-30 ENCOUNTER — Encounter: Payer: Self-pay | Admitting: Family Medicine

## 2023-08-30 ENCOUNTER — Encounter: Payer: Self-pay | Admitting: Oncology

## 2023-08-30 ENCOUNTER — Ambulatory Visit: Admitting: Family Medicine

## 2023-08-30 VITALS — BP 161/84 | HR 75 | Temp 99.6°F | Ht 64.0 in | Wt 127.8 lb

## 2023-08-30 DIAGNOSIS — R051 Acute cough: Secondary | ICD-10-CM | POA: Insufficient documentation

## 2023-08-30 DIAGNOSIS — J069 Acute upper respiratory infection, unspecified: Secondary | ICD-10-CM | POA: Insufficient documentation

## 2023-08-30 DIAGNOSIS — J4521 Mild intermittent asthma with (acute) exacerbation: Secondary | ICD-10-CM | POA: Insufficient documentation

## 2023-08-30 MED ORDER — DOXYCYCLINE HYCLATE 100 MG PO TABS
100.0000 mg | ORAL_TABLET | Freq: Two times a day (BID) | ORAL | 0 refills | Status: DC
Start: 2023-08-30 — End: 2023-09-11

## 2023-08-30 MED ORDER — PROMETHAZINE-DM 6.25-15 MG/5ML PO SYRP: 2.5000 mL | ORAL_SOLUTION | Freq: Four times a day (QID) | ORAL | 0 refills | Status: DC | PRN

## 2023-08-30 MED ORDER — ALBUTEROL SULFATE HFA 108 (90 BASE) MCG/ACT IN AERS: 2.0000 | INHALATION_SPRAY | Freq: Four times a day (QID) | RESPIRATORY_TRACT | 1 refills | Status: AC | PRN

## 2023-08-30 MED ORDER — BENZONATATE 100 MG PO CAPS: 100.0000 mg | ORAL_CAPSULE | Freq: Two times a day (BID) | ORAL | 0 refills | Status: DC | PRN

## 2023-08-30 NOTE — Assessment & Plan Note (Signed)
 Pt seen by pulmonary, but needed refill on albuterol given acute symptoms Mild wheezing heard on exam otherwise pt was not in distress, Sp02 = 99% Pt just finished steroid course for gouty arthritis flare. - hold on steroids Pt states will contact Pulmonary for nebulizer solution refll

## 2023-08-30 NOTE — Assessment & Plan Note (Addendum)
 Secondary to upper respiratory infection and complicated by hx of asthma Promethazine DM as needed Tessalon tablets as needed Albuterol inhaler as needed Mucinex OTC for cough expectorant May use OTC flonase and nasal saline spray for post nasal drip relief Hot tea with honey, throat lozenges, humidification as needed Rest

## 2023-08-30 NOTE — Progress Notes (Signed)
 Acute Office Visit  Introduced to nurse practitioner role and practice setting.  All questions answered.  Discussed provider/patient relationship and expectations.   Subjective:     Patient ID: Heidi Hernandez, female    DOB: 04/15/1951, 73 y.o.   MRN: [id number]  Chief Complaint  Patient presents with   Cough    Deep cough with light green phlegm X yesterday associated with some chest congestion and sore throat from coughing. Pt reports not having a spleen. She reports she has not taken anything. Reports symptoms started as a dry hack.     Heidi Hernandez is a 73 year old female with a history of ITP and splenectomy who presents with a worsening cough and fever.  She has been experiencing a worsening cough and fever. The cough began as a dry cough about a week ago and has progressed to a productive cough, which is now disrupting her sleep. She has a low-grade fever that has reached nearly 100F, and she has not yet taken any medication for it today.  She has a history of idiopathic thrombocytopenic purpura (ITP) and underwent a splenectomy in the 1990s. She is concerned about her increased susceptibility to infections due to the absence of her spleen. She typically uses doxycycline when she experiences similar symptoms, although she has not been treated for this condition in a while.  She has a history of asthma and reports a little bit of gasping and tightness in her chest when breathing deeply. She has an albuterol inhaler, although she believes it may be expired. No current wheezing is noted.  She notes sinus drainage and a history of sinus infections, which she suspects might be contributing to her symptoms. She experiences headaches that usually progress to migraines and is considering taking her migraine medication, Imitrex, if necessary.  She is currently taking methadone in a very small amount for restless legs and is cautious about potential interactions with other medications -  states neurologist prescribes this.  She has a history of mitral valve prolapse for her murmur and denies any heart-related chest pain or difficulty breathing.  Cough This is a new problem. The current episode started 1 to 4 weeks ago. The problem has been gradually worsening. The cough is Productive of sputum. Associated symptoms include a fever, headaches, nasal congestion, postnasal drip, rhinorrhea and wheezing. Pertinent negatives include no chest pain, chills, ear congestion, ear pain, heartburn, hemoptysis, myalgias, rash, sore throat, shortness of breath, sweats or weight loss. She has tried OTC cough suppressant for the symptoms. The treatment provided mild relief. Her past medical history is significant for asthma. spenectomy   Review of Systems  Constitutional:  Positive for fever. Negative for chills and weight loss.  HENT:  Positive for postnasal drip and rhinorrhea. Negative for ear pain and sore throat.   Respiratory:  Positive for cough and wheezing. Negative for hemoptysis and shortness of breath.   Cardiovascular:  Negative for chest pain.  Gastrointestinal:  Negative for heartburn.  Musculoskeletal:  Negative for myalgias.  Skin:  Negative for rash.  Neurological:  Positive for headaches.      Objective:    BP (!) 161/84 (BP Location: Left Arm, Patient Position: Sitting, Cuff Size: Small)   Pulse 75   Temp 99.6 F (37.6 C) (Oral)   Ht 5\' 4"  (1.626 m)   Wt 127 lb 12.8 oz (58 kg)   SpO2 99%   BMI 21.94 kg/m    Physical Exam  No results found for  any visits on 08/30/23.      Assessment & Plan:   Problem List Items Addressed This Visit       Respiratory   Upper respiratory tract infection - Primary   Productive cough, low-grade fever, and mild sinus tenderness suggest sinus infection.  Pt hx of splenectomy and immunosuppression High risk of developing worsening infection necessitating prompt treatment to prevent pneumonia. - Prescribe doxycycline 100mg   BID for 7 days - Recommend alternating acetaminophen and ibuprofen every 4-6 hours for fever. - Prescribe promethazine-dextromethorphan for cough. - Advise guaifenesin for mucus clearance. - Reorder albuterol inhaler prn for asthma relief. - Instruct to seek higher care if symptoms worsen or higher fever develops given complicated hx of no spleen.      Relevant Medications   doxycycline (VIBRA-TABS) 100 MG tablet   benzonatate (TESSALON) 100 MG capsule   Mild intermittent asthma with acute exacerbation   Pt seen by pulmonary, but needed refill on albuterol given acute symptoms Mild wheezing heard on exam otherwise pt was not in distress, Sp02 = 99% Pt just finished steroid course for gouty arthritis flare. - hold on steroids Pt states will contact Pulmonary for nebulizer solution refll      Relevant Medications   albuterol (VENTOLIN HFA) 108 (90 Base) MCG/ACT inhaler     Other   Acute cough   Secondary to upper respiratory infection and complicated by hx of asthma Promethazine DM as needed Tessalon tablets as needed Albuterol inhaler as needed Mucinex OTC for cough expectorant May use OTC flonase and nasal saline spray for post nasal drip relief Hot tea with honey, throat lozenges, humidification as needed Rest       Relevant Medications   promethazine-dextromethorphan (PROMETHAZINE-DM) 6.25-15 MG/5ML syrup   benzonatate (TESSALON) 100 MG capsule      Meds ordered this encounter  Medications   promethazine-dextromethorphan (PROMETHAZINE-DM) 6.25-15 MG/5ML syrup    Sig: Take 2.5 mLs by mouth 4 (four) times daily as needed for cough.    Dispense:  118 mL    Refill:  0   albuterol (VENTOLIN HFA) 108 (90 Base) MCG/ACT inhaler    Sig: Inhale 2 puffs into the lungs every 6 (six) hours as needed for wheezing. May substitute generic or equivalent brand name    Dispense:  18 g    Refill:  1   doxycycline (VIBRA-TABS) 100 MG tablet    Sig: Take 1 tablet (100 mg total) by  mouth 2 (two) times daily.    Dispense:  14 tablet    Refill:  0   benzonatate (TESSALON) 100 MG capsule    Sig: Take 1 capsule (100 mg total) by mouth 2 (two) times daily as needed for cough.    Dispense:  20 capsule    Refill:  0    Return if symptoms worsen or fail to improve.  Sallee Provencal, FNP

## 2023-08-30 NOTE — Assessment & Plan Note (Signed)
 Productive cough, low-grade fever, and mild sinus tenderness suggest sinus infection.  Pt hx of splenectomy and immunosuppression High risk of developing worsening infection necessitating prompt treatment to prevent pneumonia. - Prescribe doxycycline 100mg  BID for 7 days - Recommend alternating acetaminophen and ibuprofen every 4-6 hours for fever. - Prescribe promethazine-dextromethorphan for cough. - Advise guaifenesin for mucus clearance. - Reorder albuterol inhaler prn for asthma relief. - Instruct to seek higher care if symptoms worsen or higher fever develops given complicated hx of no spleen.

## 2023-08-31 ENCOUNTER — Ambulatory Visit: Payer: Self-pay | Admitting: Family Medicine

## 2023-08-31 NOTE — Telephone Encounter (Signed)
 Chief Complaint: Fever Symptoms: Productive cough, body aches, headache, diarrhea, loss of appetite, nauseous, dry heaving, a little difficulty breathing, dry mouth Frequency: Intermittent Pertinent Negatives: Patient denies vomiting Disposition: [] ED /[x] Urgent Care (no appt availability in office) / [] Appointment(In office/virtual)/ []  [place] [facility]/ [] Home Care/ [] Refused Recommended Disposition /[] [place] [facility]/ []  Follow-up with PCP Additional Notes: Pt and her husband, Sherrine Maples, both tested positive for the flu. Glenn's temperature is 100.7 F and pt's temperature if 100.2 F right now. Pt's temperatures were higher (101-102F), Pt has been taking Tylenol. Pt was seen in office yesterday and prescribed doxycycline. Pt advised to go to urgent care as no appointment availability in office today. Pt states she and her husband might go. The urgent care address was given to pt. This RN educated pt on home care, new-worsening symptoms, when to call back/seek emergent care. Pt verbalized understanding and agrees to plan.     Copied from CRM [id number]. Topic: Clinical - Red Word Triage >> Aug 31, 2023  5:28 PM Shelah Lewandowsky wrote: Red Word that prompted transfer to Nurse Triage: just took home test and positive for Flu A- running fevers of 102-103, cough, body aches, headache, diarrhea Reason for Disposition  [1] Fever > 101 F (38.3 C) AND [2] age > 60 years  Answer Assessment - Initial Assessment Questions 1. TEMPERATURE: "What is the most recent temperature?"  "How was it measured?"      100.2 F  2. ONSET: "When did the fever start?"      Yesterday 3. CHILLS: "Do you have chills?" If yes: "How bad are they?"  (e.g., none, mild, moderate, severe)   - NONE: no chills   - MILD: feeling cold   - MODERATE: feeling very cold, some shivering (feels better under a thick blanket)   - SEVERE: feeling extremely cold with shaking chills (general body shaking, rigors; even under a thick blanket)       Yes 4. OTHER SYMPTOMS: "Do you have any other symptoms besides the fever?"  (e.g., abdomen pain, cough, diarrhea, earache, headache, sore throat, urination pain)     Productive cough (a little yellow), body aches, headache, diarrhea x3  Protocols used: Fever-A-AH

## 2023-09-01 ENCOUNTER — Ambulatory Visit: Payer: Self-pay | Admitting: Family Medicine

## 2023-09-01 ENCOUNTER — Telehealth: Admitting: Physician Assistant

## 2023-09-01 DIAGNOSIS — J101 Influenza due to other identified influenza virus with other respiratory manifestations: Secondary | ICD-10-CM | POA: Diagnosis not present

## 2023-09-01 DIAGNOSIS — J069 Acute upper respiratory infection, unspecified: Secondary | ICD-10-CM

## 2023-09-01 DIAGNOSIS — I4892 Unspecified atrial flutter: Secondary | ICD-10-CM

## 2023-09-01 MED ORDER — CETIRIZINE HCL 10 MG PO TABS: 10.0000 mg | ORAL_TABLET | Freq: Every day | ORAL | 11 refills | Status: AC

## 2023-09-01 MED ORDER — CELECOXIB 200 MG PO CAPS: 200.0000 mg | ORAL_CAPSULE | Freq: Two times a day (BID) | ORAL | 0 refills | Status: DC

## 2023-09-01 NOTE — Telephone Encounter (Signed)
  Chief Complaint: FluA Symptoms: Wet cough with dark phlegm, fever Frequency: Tuesday Pertinent Negatives: Patient denies  Disposition: [] ED /[] Urgent Care (no appt availability in office) / [x] Appointment(In office/virtual)/ []  [place] [facility]/ [] Home Care/ [] Refused Recommended Disposition /[]  Mobile Bus/ []  Follow-up with PCP Additional Notes: Additional Notes: Pt tested + for flu A. Pt has a hx of pneumonia, pt is having mild breathing difficulty. Pt was seen earlier this week for same s/s.  Pt is interested in anti-viral medications. Pt's husband also has FluA. Home care advice reviewed. MyChart visit scheduled.    Copied from CRM [id number]. Topic: Clinical - Medical Advice >> Sep 01, 2023  8:09 AM Everette C wrote: Reason for CRM: The patient has tested positive for Influenza Type A  The patient would like to be prescribed Tamiflu to help with their discomfort and symptoms (cough, congestion)   Please contact further when possible Answer Assessment - Initial Assessment Questions 1. WORST SYMPTOM: "What is your worst symptom?" (e.g., cough, runny nose, muscle aches, headache, sore throat, fever)      Cough - headache 2. ONSET: "When did your flu symptoms start?"      08/30/2023 3. COUGH: "How bad is the cough?"       Nearly constant 4. RESPIRATORY DISTRESS: "Describe your breathing."      Is having some SOB, difficulty breathing 5. FEVER: "Do you have a fever?" If Yes, ask: "What is your temperature, how was it measured, and when did it start?"     Over 100 - 102.8 taking IBU and tylenol for fever 8. HIGH RISK DISEASE: "Do you have any chronic medical problems?" (e.g., heart or lung disease, asthma, weak immune system, or other HIGH RISK conditions)     No spleen - asthma  10. OTHER SYMPTOMS: "Do you have any other symptoms?"  (e.g., runny nose, muscle aches, headache, sore throat)       Nausea - not able to eat well  Protocols used: Influenza -  [facility]

## 2023-09-02 NOTE — Progress Notes (Signed)
 MyChart Video Visit  Virtual Visit via Video Note   This format is felt to be most appropriate for this patient at this time. Physical exam was limited by quality of the video and audio technology used for the visit.   Provider location: Virtual Visit Location Provider: Office/Clinic Patient Location: Home  I discussed the limitations of evaluation and management by telemedicine and the availability of in person appointments. The patient expressed understanding and agreed to proceed.  Patient: Heidi Hernandez   DOB: September 17, 1950   73 y.o. Female  MRN: [id number] Visit Date: 09/01/2023  Today's healthcare provider: Debera Lat, PA-C   No chief complaint on file.  Subjective     Discussed the use of AI scribe software for clinical note transcription with the patient, who gave verbal consent to proceed.  History of Present Illness         The patient, with a history of asthma, presents with a persistent cough and fever. She reports feeling unwell and has been coughing all night. The patient has been taking doxycycline for four days, prescribed by another healthcare provider who suggested the symptoms might be the beginning of bronchitis. Despite the medication, the patient reports feeling worse. The patient also uses an albuterol inhaler, which has been helping with her asthma symptoms that flare up during respiratory infections. The patient has also been taking Mucinex, but reports it's not helping a lot. The patient's fever has been fluctuating, reaching up to 102.42F, but has been managed around 100F with Tylenol and ibuprofen. The patient also reports postnasal drip and chest congestion.    Medications: Outpatient Medications Prior to Visit  Medication Sig   albuterol (VENTOLIN HFA) 108 (90 Base) MCG/ACT inhaler Inhale 2 puffs into the lungs every 6 (six) hours as needed for wheezing. May substitute generic or equivalent brand name   alendronate (FOSAMAX) 70 MG tablet TAKE 1  TABLET BY MOUTH ONCE WEEKLY ON EMTPY STOMACH BEFORE BREAKFAST. REMAIN UPRIGHT FOR 30 MINUTES AND TAKE WITH 8 OUNCES OF WATER   amLODipine (NORVASC) 10 MG tablet TAKE 1 TABLET BY MOUTH DAILY   atenolol (TENORMIN) 25 MG tablet TAKE 1 TABLET BY MOUTH DAILY   azelastine (ASTELIN) 0.1 % nasal spray Place 2 sprays into both nostrils daily as needed for rhinitis or allergies. (Patient not taking: Reported on 08/30/2023)   benzonatate (TESSALON) 100 MG capsule Take 1 capsule (100 mg total) by mouth 2 (two) times daily as needed for cough.   cyanocobalamin (VITAMIN B12) 1000 MCG tablet Take 1,000 mcg by mouth daily.   doxycycline (VIBRA-TABS) 100 MG tablet Take 1 tablet (100 mg total) by mouth 2 (two) times daily.   fluticasone (FLONASE) 50 MCG/ACT nasal spray Place 2 sprays into both nostrils daily as needed for allergies. (Patient not taking: Reported on 08/30/2023)   fluticasone (FLOVENT HFA) 44 MCG/ACT inhaler Inhale 1 puff into the lungs daily as needed (Shortness of breath).   methadone (DOLOPHINE) 5 MG tablet Take 5 mg by mouth daily.   montelukast (SINGULAIR) 10 MG tablet Take 1 tablet (10 mg total) by mouth at bedtime.   neomycin-polymyxin-hydrocortisone (CORTISPORIN) OTIC solution 3 drops in affected ear twice a day as needed   omeprazole (PRILOSEC) 20 MG capsule Take 20 mg by mouth daily.   ondansetron (ZOFRAN ODT) 4 MG disintegrating tablet Take 1 tablet (4 mg total) by mouth every 8 (eight) hours as needed for nausea or vomiting. (Patient not taking: Reported on 08/30/2023)   pramipexole (MIRAPEX) 0.5 MG tablet  Take 1 tablet by mouth every evening. 4-5 pm   promethazine-dextromethorphan (PROMETHAZINE-DM) 6.25-15 MG/5ML syrup Take 2.5 mLs by mouth 4 (four) times daily as needed for cough.   SUMAtriptan (IMITREX) 100 MG tablet TAKE 1 TABLET BY MOUTH AT ONSET OF HEADACHE. MAY TAKE AN ADDITIONAL TABLET IN 2 HOURS IF NEEDED. MAX OF 200MG  IN 24 HOURS   terbinafine (LAMISIL) 250 MG tablet Take 1 tablet (250  mg total) by mouth daily.   triamcinolone cream (KENALOG) 0.1 % Apply 1 Application topically 2 (two) times daily. (Patient taking differently: Apply 1 Application topically 2 (two) times daily as needed (itching).)   Vitamin D, Ergocalciferol, (DRISDOL) 1.25 MG (50000 UNIT) CAPS capsule Take 1 capsule (50,000 Units total) by mouth once a week.   No facility-administered medications prior to visit.    Review of Systems  All other systems reviewed and are negative.  All negative  Except see HPI       Objective    There were no vitals taken for this visit.       Physical Exam Constitutional:      General: She is not in acute distress.    Appearance: Normal appearance.  HENT:     Head: Normocephalic.  Pulmonary:     Effort: Pulmonary effort is normal. No respiratory distress.  Neurological:     Mental Status: She is alert and oriented to person, place, and time. Mental status is at baseline.     Constitutional:      General: She is not in acute distress.    Appearance: Normal appearance.  HENT:     Head: Normocephalic.  Pulmonary:     Effort: Pulmonary effort is normal. No respiratory distress.  Neurological:     Mental Status: She is alert and oriented to person, place, and time. Mental status is at baseline.      Assessment and Plan      Influenza Positive home flu test with persistent cough, fever, and postnasal drip. No evidence of pneumonia on recent exam. Asthma exacerbation likely contributing to symptoms. -Continue Doxycycline for 3 more days. -Continue Albuterol inhaler as needed. -Start Zyrtec for postnasal drip. -Continue Mucinex for chest congestion. -Use Vicks VapoRub on chest and throat. -Continue symptomatic treatment:hydration, warm salt gargle, hot tea with honey, nasal saline rinse, and Flonase if needed. -If cough persists, consider prescribing a different spray for chronic cough.  Fever Persistent fever up to 102.8 F, currently managed  with Tylenol and ibuprofen. -Start Celebrex for inflammation, pain, and fever. -Continue Tylenol as needed. -If fever persists despite medication, seek urgent care or follow up on Monday.  General Health Maintenance -Continue isolation until 24 hours after fever resolves without the use of fever-reducing medication. -Practice hand hygiene and wear a mask when picking up prescriptions.  No follow-ups on file.    I discussed the assessment and treatment plan with the patient. The patient was provided an opportunity to ask questions and all were answered. The patient agreed with the plan and demonstrated an understanding of the instructions.   The patient was advised to call back or seek an in-person evaluation if the symptoms worsen or if the condition fails to improve as anticipated.  I, Debera Lat, PA-C have reviewed all documentation for this visit. The documentation on 09/01/2023  for the exam, diagnosis, procedures, and orders are all accurate and complete.  Debera Lat, [facility], MMS [facility] [phone] (phone) [phone] (fax)  [facility]

## 2023-09-08 ENCOUNTER — Other Ambulatory Visit: Payer: Self-pay | Admitting: Family Medicine

## 2023-09-08 DIAGNOSIS — I10 Essential (primary) hypertension: Secondary | ICD-10-CM

## 2023-09-11 ENCOUNTER — Other Ambulatory Visit: Payer: Self-pay | Admitting: Family Medicine

## 2023-09-11 ENCOUNTER — Telehealth: Payer: Self-pay

## 2023-09-11 DIAGNOSIS — J069 Acute upper respiratory infection, unspecified: Secondary | ICD-10-CM

## 2023-09-11 DIAGNOSIS — R051 Acute cough: Secondary | ICD-10-CM

## 2023-09-11 MED ORDER — DOXYCYCLINE HYCLATE 100 MG PO TABS
100.0000 mg | ORAL_TABLET | Freq: Two times a day (BID) | ORAL | 0 refills | Status: DC
Start: 2023-09-11 — End: 2024-04-08

## 2023-09-11 MED ORDER — PROMETHAZINE-DM 6.25-15 MG/5ML PO SYRP: 2.5000 mL | ORAL_SOLUTION | Freq: Four times a day (QID) | ORAL | 0 refills | Status: DC | PRN

## 2023-09-11 NOTE — Telephone Encounter (Signed)
 Copied from CRM [id number]. Topic: Clinical - Medication Refill >> Sep 11, 2023  8:10 AM [name] wrote: Most Recent Primary Care Visit:  Provider: Debera Lat  Department: [facility]  Visit Type: ACUTE  Date: 09/01/2023  Medication: promethazine-dextromethorphan (PROMETHAZINE-DM) 6.25-15 MG/5ML syrup [[id number]]  Has the patient contacted their pharmacy? No (Agent: If no, request that the patient contact the pharmacy for the refill. If patient does not wish to contact the pharmacy document the reason why and proceed with request.) (Agent: If yes, when and what did the pharmacy advise?)  Is this the correct pharmacy for this prescription? Yes If no, delete pharmacy and type the correct one.  This is the patient's preferred pharmacy:  [facility] [id number] [place], [place] - [address] [place] [address] Phone: [phone] Fax: [phone]   Has the prescription been filled recently? No  Is the patient out of the medication? Yes  Has the patient been seen for an appointment in the last year OR does the patient have an upcoming appointment? Yes  Can we respond through MyChart? No  Agent: Please be advised that Rx refills may take up to 3 business days. We ask that you follow-up with your pharmacy.

## 2023-09-11 NOTE — Telephone Encounter (Signed)
 There is another CRM that the [employer] generated asking that the patient be called after the medication is sent in.   Copied from CRM [id number]. Topic: Clinical - Medication Question >> Sep 11, 2023  8:14 AM Carlatta H wrote: Reason for CRM: Patient would also like a prescription called in for mouth sores//Please call

## 2023-09-11 NOTE — Telephone Encounter (Signed)
 Copied from CRM [id number]. Topic: Clinical - Medication Refill >> Sep 11, 2023  8:06 AM [name] wrote: Most Recent Primary Care Visit:  Provider: Debera Lat  Department: [facility]  Visit Type: ACUTE  Date: 09/01/2023  Medication: doxycycline (VIBRA-TABS) 100 MG tablet [[id number]]  Has the patient contacted their pharmacy? No (Agent: If no, request that the patient contact the pharmacy for the refill. If patient does not wish to contact the pharmacy document the reason why and proceed with request.) (Agent: If yes, when and what did the pharmacy advise?)  Is this the correct pharmacy for this prescription? Yes If no, delete pharmacy and type the correct one.  This is the patient's preferred pharmacy:  [facility] [id number] [place], [place] - [address] [place] [address] Phone: [phone] Fax: [phone]   Has the prescription been filled recently? No  Is the patient out of the medication? Yes  Has the patient been seen for an appointment in the last year OR does the patient have an upcoming appointment? No  Can we respond through MyChart? No  Agent: Please be advised that Rx refills may take up to 3 business days. We ask that you follow-up with your pharmacy.

## 2023-09-12 MED ORDER — NYSTATIN 100000 UNIT/ML MT SUSP: 5.0000 mL | Freq: Four times a day (QID) | OROMUCOSAL | 1 refills | Status: DC

## 2023-09-12 NOTE — Telephone Encounter (Signed)
Pt aware.

## 2023-09-12 NOTE — Addendum Note (Signed)
 Addended by: [name] E on: 09/12/2023 12:16 PM   Modules accepted: Orders

## 2023-09-12 NOTE — Telephone Encounter (Signed)
 Copied from CRM [id number]. Topic: Clinical - Medication Question >> Sep 11, 2023  8:09 AM Carlatta H wrote: Reason for CRM: Patient would like a call once medication have been called in//

## 2023-09-12 NOTE — Telephone Encounter (Signed)
 Prescription dukes mouthwash sent to [facility]

## 2023-09-20 ENCOUNTER — Telehealth: Payer: Self-pay | Admitting: Family Medicine

## 2023-09-20 ENCOUNTER — Other Ambulatory Visit: Payer: Self-pay | Admitting: Family Medicine

## 2023-09-20 DIAGNOSIS — G43819 Other migraine, intractable, without status migrainosus: Secondary | ICD-10-CM

## 2023-09-20 NOTE — Telephone Encounter (Signed)
 [facility]idi [facility] pharmacy is requesting refill  SUMAtriptan (IMITREX) 100 MG tablet  Please advise

## 2023-09-27 ENCOUNTER — Other Ambulatory Visit: Payer: Self-pay | Admitting: Physician Assistant

## 2023-09-27 DIAGNOSIS — J069 Acute upper respiratory infection, unspecified: Secondary | ICD-10-CM

## 2023-09-28 NOTE — Telephone Encounter (Signed)
 Requested Prescriptions  Pending Prescriptions Disp Refills   celecoxib (CELEBREX) 200 MG capsule [Pharmacy Med Name: CELECOXIB 200 MG CAPSULE] 180 capsule 0    Sig: TAKE 1 CAPSULE BY MOUTH 2 TIMES A DAY     Analgesics:  COX2 Inhibitors Failed - 09/28/2023  2:43 PM      Failed - Manual Review: Labs are only required if the patient has taken medication for more than 8 weeks.      Failed - Valid encounter within last 12 months    Recent Outpatient Visits           3 weeks ago Upper respiratory tract infection, unspecified type   [place] [facility] [place], [place], PA-C   4 weeks ago Upper respiratory tract infection, unspecified type   [place] [facility], Jennette Kettle, FNP       Future Appointments             In 2 months Fisher, Demetrios Isaacs, MD [facility]            Passed - HGB in normal range and within 360 days    Hemoglobin  Date Value Ref Range Status  07/20/2023 14.2 12.0 - 15.0 g/dL Final  16/03/9603 54.0 11.1 - 15.9 g/dL Final         Passed - Cr in normal range and within 360 days    Creatinine, Ser  Date Value Ref Range Status  05/11/2023 0.64 0.57 - 1.00 mg/dL Final         Passed - HCT in normal range and within 360 days    HCT  Date Value Ref Range Status  07/20/2023 40.6 36.0 - 46.0 % Final   Hematocrit  Date Value Ref Range Status  06/04/2020 35.7 34.0 - 46.6 % Final         Passed - AST in normal range and within 360 days    AST  Date Value Ref Range Status  05/11/2023 26 0 - 40 IU/L Final         Passed - ALT in normal range and within 360 days    ALT  Date Value Ref Range Status  05/11/2023 16 0 - 32 IU/L Final         Passed - eGFR is 30 or above and within 360 days    GFR calc Af Amer  Date Value Ref Range Status  06/04/2020 99 >59 mL/min/1.73 Final    Comment:    **In accordance with recommendations from the NKF-ASN Task force,**   [facility] is in the process  of updating its eGFR calculation to the   2021 CKD-EPI creatinine equation that estimates kidney function   without a race variable.    GFR, Estimated  Date Value Ref Range Status  10/31/2022 >60 >60 mL/min Final    Comment:    (NOTE) Calculated using the CKD-EPI Creatinine Equation (2021)    eGFR  Date Value Ref Range Status  05/11/2023 93 >59 mL/min/1.73 Final         Passed - Patient is not pregnant

## 2023-10-01 ENCOUNTER — Other Ambulatory Visit: Payer: Self-pay | Admitting: Family Medicine

## 2023-10-01 DIAGNOSIS — E559 Vitamin D deficiency, unspecified: Secondary | ICD-10-CM

## 2023-10-07 ENCOUNTER — Other Ambulatory Visit: Payer: Self-pay | Admitting: Family Medicine

## 2023-10-07 DIAGNOSIS — I10 Essential (primary) hypertension: Secondary | ICD-10-CM

## 2023-12-04 ENCOUNTER — Ambulatory Visit: Payer: Self-pay

## 2023-12-04 ENCOUNTER — Other Ambulatory Visit: Payer: Self-pay

## 2023-12-04 ENCOUNTER — Emergency Department

## 2023-12-04 ENCOUNTER — Emergency Department
Admission: EM | Admit: 2023-12-04 | Discharge: 2023-12-04 | Disposition: A | Discharge status: Home / Self Care | Attending: Emergency Medicine | Admitting: Emergency Medicine

## 2023-12-04 DIAGNOSIS — I77810 Thoracic aortic ectasia: Secondary | ICD-10-CM | POA: Diagnosis not present

## 2023-12-04 DIAGNOSIS — K449 Diaphragmatic hernia without obstruction or gangrene: Secondary | ICD-10-CM | POA: Diagnosis not present

## 2023-12-04 DIAGNOSIS — R079 Chest pain, unspecified: Secondary | ICD-10-CM | POA: Diagnosis not present

## 2023-12-04 DIAGNOSIS — R0789 Other chest pain: Secondary | ICD-10-CM | POA: Diagnosis not present

## 2023-12-04 DIAGNOSIS — S299XXA Unspecified injury of thorax, initial encounter: Secondary | ICD-10-CM | POA: Diagnosis not present

## 2023-12-04 DIAGNOSIS — I700 Atherosclerosis of aorta: Secondary | ICD-10-CM | POA: Diagnosis not present

## 2023-12-04 DIAGNOSIS — Z96611 Presence of right artificial shoulder joint: Secondary | ICD-10-CM | POA: Diagnosis not present

## 2023-12-04 HISTORY — DX: Age-related osteoporosis without current pathological fracture: M81.0

## 2023-12-04 HISTORY — DX: Disorder of arteries and arterioles, unspecified: I77.9

## 2023-12-04 LAB — BASIC METABOLIC PANEL WITH GFR
Anion gap: 10 (ref 5–15)
BUN: 17 mg/dL (ref 8–23)
CO2: 22 mmol/L (ref 22–32)
Calcium: 9.2 mg/dL (ref 8.9–10.3)
Chloride: 109 mmol/L (ref 98–111)
Creatinine, Ser: 0.57 mg/dL (ref 0.44–1.00)
GFR, Estimated: >60 mL/min (ref >60)
Glucose, Bld: 99 mg/dL (ref 70–99)
Potassium: 4 mmol/L (ref 3.5–5.1)
Sodium: 141 mmol/L (ref 135–145)

## 2023-12-04 LAB — CBC
HCT: 39.1 % (ref 36.0–46.0)
Hemoglobin: 13.6 g/dL (ref 12.0–15.0)
MCH: 32.6 pg (ref 26.0–34.0)
MCHC: 34.8 g/dL (ref 30.0–36.0)
MCV: 93.8 fL (ref 80.0–100.0)
Platelets: 116 10*3/uL — ABNORMAL LOW (ref 150–400)
RBC: 4.17 MIL/uL (ref 3.87–5.11)
RDW: 13.7 % (ref 11.5–15.5)
WBC: 8.9 10*3/uL (ref 4.0–10.5)
nRBC: 0 % (ref 0.0–0.2)

## 2023-12-04 LAB — TROPONIN I (HIGH SENSITIVITY): Troponin I (High Sensitivity): 5 ng/L (ref <18)

## 2023-12-04 MED ORDER — IOHEXOL 350 MG/ML SOLN
75.0000 mL | Freq: Once | INTRAVENOUS | Status: AC | PRN
  Administered 2023-12-04: 75 mL via INTRAVENOUS

## 2023-12-04 MED ORDER — FENTANYL CITRATE PF 50 MCG/ML IJ SOSY
50.0000 ug | PREFILLED_SYRINGE | Freq: Once | INTRAMUSCULAR | Status: AC
  Administered 2023-12-04: 50 ug via INTRAVENOUS
  Filled 2023-12-04: qty 1

## 2023-12-04 MED ORDER — LIDOCAINE 5 % EX PTCH
1.0000 | MEDICATED_PATCH | CUTANEOUS | Status: DC
  Administered 2023-12-04: 1 via TRANSDERMAL
  Filled 2023-12-04: qty 1

## 2023-12-04 MED ORDER — ACETAMINOPHEN 500 MG PO TABS
1000.0000 mg | ORAL_TABLET | Freq: Once | ORAL | Status: AC
  Administered 2023-12-04: 1000 mg via ORAL
  Filled 2023-12-04: qty 2

## 2023-12-04 NOTE — ED Triage Notes (Signed)
 Pt to ED with husband from home for L rib pain since last night. Pot has osteoporosis and has been gardening a lot and leaning over and believes may cracked a rib. States tender to palpation, especially 1 spot. Painful to move, talk and breathe. Skin is dry.

## 2023-12-04 NOTE — ED Provider Notes (Addendum)
 [facility] Provider Note    Event Date/Time   First MD Initiated Contact with Patient 12/04/23 1214     (approximate)   History   Rib Injury   HPI  Heidi Hernandez is a 73 y.o. female past medical history significant for thoracic aortic aneurysm, diaphragmatic hernia, presents to the emergency department with chest pain.  Patient Heidi Hernandez is left-sided chest pain that started last night.  States that it did go away for a short period of time but then returned this morning.  Sharp stabbing pain to the left side of her chest.  Feels like it is worse to take a big deep breath.  Denies nausea, vomiting or diaphoresis.  No exertional changes.  No improvement with lying down or sitting up.  Denies any falls or trauma.  Does endorse working outside in the garden.  No recent illness.  No history of DVT or PE.  Followed by cardiology.     Physical Exam   Triage Vital Signs: ED Triage Vitals  Encounter Vitals Group     BP 12/04/23 1151 (!) 158/87     Systolic BP Percentile --      Diastolic BP Percentile --      Pulse Rate 12/04/23 1151 76     Resp 12/04/23 1151 20     Temp 12/04/23 1151 99 F (37.2 C)     Temp Source 12/04/23 1151 Oral     SpO2 12/04/23 1151 99 %     Weight 12/04/23 1151 118 lb (53.5 kg)     Height 12/04/23 1151 5\' 4"  (1.626 m)     Head Circumference --      Peak Flow --      Pain Score 12/04/23 1149 10     Pain Loc --      Pain Education --      Exclude from Growth Chart --     Most recent vital signs: Vitals:   12/04/23 1151  BP: (!) 158/87  Pulse: 76  Resp: 20  Temp: 99 F (37.2 C)  SpO2: 99%    Physical Exam Constitutional:      Appearance: She is well-developed.  HENT:     Head: Atraumatic.  Eyes:     Conjunctiva/sclera: Conjunctivae normal.  Cardiovascular:     Rate and Rhythm: Regular rhythm.  Pulmonary:     Effort: No respiratory distress.     Breath sounds: No wheezing.  Chest:     Chest wall: Tenderness  (Point tenderness to the left chest wall) present.  Abdominal:     General: There is no distension.  Musculoskeletal:        General: Normal range of motion.     Cervical back: Normal range of motion.     Right lower leg: No edema.     Left lower leg: No edema.     Comments: +2 DP and radial pulses that are equal bilaterally  Skin:    General: Skin is warm.     Capillary Refill: Capillary refill takes less than 2 seconds.  Neurological:     Mental Status: She is alert. Mental status is at baseline.     IMPRESSION / MDM / ASSESSMENT AND PLAN / ED COURSE  I reviewed the triage vital signs and the nursing notes.  Differential diagnosis including rib fracture, musculoskeletal strain, pneumothorax, anemia, pulmonary embolism, ACS.  Have a low suspicion for dissection, no tearing chest pain and pulses are equal and symmetric.  EKG  Nereida Banning, the attending physician, personally viewed and interpreted this ECG.  EKG with first-degree heart block.  Normal intervals otherwise with narrow complex.  No significant ST elevation or depression.  No findings of acute ischemia or dysrhythmia.  No tachycardic or bradycardic dysrhythmias while on cardiac telemetry.  RADIOLOGY I independently reviewed imaging, my interpretation of imaging: Chest x-ray with no signs of pneumonia, no widened mediastinum no obvious rib fractures or pneumothorax.  CTA read as no acute findings.  LABS (all labs ordered are listed, but only abnormal results are displayed) Labs interpreted as -    Labs Reviewed  CBC - Abnormal; Notable for the following components:      Result Value   Platelets 116 (*)    All other components within normal limits  BASIC METABOLIC PANEL WITH GFR  TROPONIN I (HIGH SENSITIVITY)     MDM   Clinical Course as of 12/04/23 1530  Mon Dec 04, 2023  1324 On reevaluation ongoing pain to the left side.  Does have pinpoint pain.  Will do a dose of fentanyl  for pain control.   Discussed risk and benefits and ultimately will do a CTA to further evaluate for pulmonary embolism, evaluate for diaphragmatic hernia injury and have a low suspicion for dissection but does have a history of aneurysm. [SM]    Clinical Course User Index [SM] Viviano Ground, MD   No leukocytosis or anemia.  Chronic thrombocytopenia with platelets of 116.  No signs of bleeding.  Troponin is negative, have low suspicion for ACS, ongoing chest pain do not feel that serial troponins are necessary.  EKG and symptoms are not consistent with acute pericarditis.  CTA currently pending.  If CTA is negative for acute abnormality plan to discharge home with outpatient follow-up.  Care transferred to incoming provider with CTA pending.  CTA with no acute findings.  Discussed close follow-up as an outpatient with primary care and cardiology and discussed return to the emergency department for any worsening symptoms.  PROCEDURES:  Critical Care performed: No  Procedures  Patient's presentation is most consistent with acute presentation with potential threat to life or bodily function.   MEDICATIONS ORDERED IN ED: Medications  lidocaine  (LIDODERM ) 5 % 1 patch (1 patch Transdermal Patch Applied 12/04/23 1300)  acetaminophen  (TYLENOL ) tablet 1,000 mg (1,000 mg Oral Given 12/04/23 1300)  fentaNYL  (SUBLIMAZE ) injection 50 mcg (50 mcg Intravenous Given 12/04/23 1330)  iohexol  (OMNIPAQUE ) 350 MG/ML injection 75 mL (75 mLs Intravenous Contrast Given 12/04/23 1508)    FINAL CLINICAL IMPRESSION(S) / ED DIAGNOSES   Final diagnoses:  Chest pain, unspecified type     Rx / DC Orders   ED Discharge Orders     None        Note:  This document was prepared using Dragon voice recognition software and may include unintentional dictation errors.   Viviano Ground, MD 12/04/23 1512    Viviano Ground, MD 12/04/23 1530

## 2023-12-04 NOTE — Discharge Instructions (Signed)
 You are seen in the emergency department and your CT scan did not show any signs of blood clots.  No signs of pneumonia or obvious rib fracture.  You had a heart enzyme that was normal and do not believe you are having a heart attack today.  You most likely pulled a muscle in the left chest wall.  You can use over-the-counter Lidoderm  patches and leave on for 12 hours.  You can alternate Motrin  and Tylenol  for pain control.  Follow-up closely with your primary care physician.  Return for any worsening symptoms.  Pain control:  Ibuprofen  (motrin /aleve /advil ) - You can take 3 tablets (600 mg) every 6 hours as needed for pain/fever.  Acetaminophen  (tylenol ) - You can take 2 extra strength tablets (1000 mg) every 6 hours as needed for pain/fever.  You can alternate these medications or take them together.  Make sure you eat food/drink water when taking these medications.

## 2023-12-04 NOTE — Telephone Encounter (Signed)
 FYI Only or Action Required?: FYI only for provider  Patient was last seen in primary care on 08/30/2023 by Tasia Farr, FNP. Called Nurse Triage reporting Shortness of Breath. Symptoms began yesterday. Interventions attempted: Rest, hydration, or home remedies. Symptoms are: gradually worsening.  Triage Disposition: Go to ED Now (Notify PCP)  Patient/caregiver understands and will follow disposition?: Yes                    Copied from CRM [id number]. Topic: Clinical - Red Word Triage >> Dec 04, 2023 10:26 AM Heidi Hernandez wrote: Red Word that prompted transfer to Nurse Triage: Patient is in a lot of pain, thinks she has a cracked rib. Trouble breathing. Pain started last night, woke up and felt better and now is very intense. No idea what caused this. States the pain is intense. On the left side, on the front of rib cage. Reason for Disposition  [1] MODERATE difficulty breathing (e.g., speaks in phrases, SOB even at rest, pulse 100-120) AND [2] NEW-onset or WORSE than normal  Answer Assessment - Initial Assessment Questions 1. RESPIRATORY STATUS: "Describe your breathing?" (e.g., wheezing, shortness of breath, unable to speak, severe coughing)      "Last night at 2100 I felt a little pain, I thought it was maybe gas. I did have some gas, but then before long my rib was sore, I thought I had hit something"; this AM pain felt better, she went into her garden, went back inside, pain returned, worse in the last 20 minutes w/ difficulty breathing 2. ONSET: "When did this breathing problem begin?"      20 minutes before calling  3. PATTERN "Does the difficult breathing come and go, or has it been constant since it started?"      Constant  4. SEVERITY: "How bad is your breathing?" (e.g., mild, moderate, severe)    - MILD: No SOB at rest, mild SOB with walking, speaks normally in sentences, can lie down, no retractions, pulse < 100.    - MODERATE: SOB at rest, SOB with minimal exertion  and prefers to sit, cannot lie down flat, speaks in phrases, mild retractions, audible wheezing, pulse 100-120.    - SEVERE: Very SOB at rest, speaks in single words, struggling to breathe, sitting hunched forward, retractions, pulse > 120      SOB at rest; pain worse taking a deep breath 5. RECURRENT SYMPTOM: "Have you had difficulty breathing before?" If Yes, ask: "When was the last time?" and "What happened that time?"      No  6. CARDIAC HISTORY: "Do you have any history of heart disease?" (e.g., heart attack, angina, bypass surgery, angioplasty)     HTN, mitral valve prolapse, LVH 7. LUNG HISTORY: "Do you have any history of lung disease?"  (e.g., pulmonary embolus, asthma, emphysema)     asthma 8. CAUSE: "What do you think is causing the breathing problem?"      Not sure - pt states she thinks she cracked a rib, pt states she cracked a rib on the tub years ago, states this is similar but worse; pt denies bruising 9. OTHER SYMPTOMS: "Do you have any other symptoms? (e.g., dizziness, runny nose, cough, chest pain, fever)     Endorses CP 10. O2 SATURATION MONITOR:  "Do you use an oxygen saturation monitor (pulse oximeter) at home?" If Yes, ask: "What is your reading (oxygen level) today?" "What is your usual oxygen saturation reading?" (e.g., 95%)  no  Protocols used: Breathing Difficulty-A-AH

## 2023-12-06 ENCOUNTER — Ambulatory Visit
Admission: RE | Admit: 2023-12-06 | Discharge: 2023-12-06 | Disposition: A | Discharge status: Home / Self Care | Source: Ambulatory Visit | Attending: Family Medicine | Admitting: Family Medicine

## 2023-12-06 ENCOUNTER — Other Ambulatory Visit: Payer: Self-pay | Admitting: Family Medicine

## 2023-12-06 DIAGNOSIS — I10 Essential (primary) hypertension: Secondary | ICD-10-CM

## 2023-12-06 DIAGNOSIS — Z1231 Encounter for screening mammogram for malignant neoplasm of breast: Secondary | ICD-10-CM | POA: Insufficient documentation

## 2023-12-06 DIAGNOSIS — Z780 Asymptomatic menopausal state: Secondary | ICD-10-CM | POA: Insufficient documentation

## 2023-12-07 ENCOUNTER — Ambulatory Visit: Payer: Self-pay | Admitting: Family Medicine

## 2023-12-11 ENCOUNTER — Ambulatory Visit (INDEPENDENT_AMBULATORY_CARE_PROVIDER_SITE_OTHER): Payer: Self-pay | Admitting: Family Medicine

## 2023-12-11 ENCOUNTER — Encounter: Payer: Self-pay | Admitting: Family Medicine

## 2023-12-11 VITALS — BP 136/64 | HR 55 | Resp 14 | Wt 120.1 lb

## 2023-12-11 DIAGNOSIS — R5383 Other fatigue: Secondary | ICD-10-CM

## 2023-12-11 DIAGNOSIS — S20212A Contusion of left front wall of thorax, initial encounter: Secondary | ICD-10-CM | POA: Diagnosis not present

## 2023-12-11 DIAGNOSIS — W57XXXA Bitten or stung by nonvenomous insect and other nonvenomous arthropods, initial encounter: Secondary | ICD-10-CM

## 2023-12-11 DIAGNOSIS — I10 Essential (primary) hypertension: Secondary | ICD-10-CM | POA: Diagnosis not present

## 2023-12-11 DIAGNOSIS — S20212S Contusion of left front wall of thorax, sequela: Secondary | ICD-10-CM

## 2023-12-11 NOTE — Patient Instructions (Signed)
 Marland Kitchen  Please review the attached list of medications and notify my office if there are any errors.   . Please bring all of your medications to every appointment so we can make sure that our medication list is the same as yours.

## 2023-12-27 NOTE — Progress Notes (Signed)
 Established patient visit   Patient: Heidi Hernandez   DOB: 04-28-1951   73 y.o. Female  MRN: [id number] Visit Date: 12/11/2023  Today's healthcare provider: Nancyann Perry, MD   Chief Complaint  Patient presents with   Follow-up   Fatigue   Peripheral Neuropathy   Fever    Tick bite found on 5/20, fever 99.3   Subjective    Discussed the use of AI scribe software for clinical note transcription with the patient, who gave verbal consent to proceed.  History of Present Illness   Heidi Hernandez is a 73 year old female who presents with left-sided chest pain.  She has been experiencing sharp, severe, and constant left-sided chest pain for the past week. The pain is localized to the left side and does not radiate. Imaging studies, including x-rays and a CT scan, ruled out any fractures. She has been using heat and salon pop patches for relief, but these have not been effective.  She mentions a recent tick bite on May 20th, followed by a mild fever and headache over the past three days. She is concerned about potential tick-borne illnesses but has not developed a rash or other significant symptoms. Her son had Lyme disease from a tick bite and continues to have issues.  She has a history of pancreatitis prior to her gallbladder removal, but she notes that the current pain is different in nature and location. Additionally, she reports right knee pain, which she attributes to arthritis and manages with ibuprofen .  Her current medications include blood pressure medications, Fosamax  once a week, methadone  for restless legs, and pramipexole . She is considering taking a B complex vitamin to address feelings of tiredness.       Medications: Outpatient Medications Prior to Visit  Medication Sig   albuterol  (VENTOLIN  HFA) 108 (90 Base) MCG/ACT inhaler Inhale 2 puffs into the lungs every 6 (six) hours as needed for wheezing. May substitute generic or equivalent brand name   alendronate   (FOSAMAX ) 70 MG tablet TAKE 1 TABLET BY MOUTH ONCE WEEKLY ON EMTPY STOMACH BEFORE BREAKFAST. REMAIN UPRIGHT FOR 30 MINUTES AND TAKE WITH 8 OUNCES OF WATER   amLODipine  (NORVASC ) 10 MG tablet TAKE 1 TABLET BY MOUTH DAILY   atenolol  (TENORMIN ) 25 MG tablet TAKE 1 TABLET BY MOUTH DAILY   benzonatate  (TESSALON ) 100 MG capsule Take 1 capsule (100 mg total) by mouth 2 (two) times daily as needed for cough.   celecoxib  (CELEBREX ) 200 MG capsule TAKE 1 CAPSULE BY MOUTH 2 TIMES A DAY   cetirizine  (ZYRTEC ) 10 MG tablet Take 1 tablet (10 mg total) by mouth daily.   cyanocobalamin  (VITAMIN B12) 1000 MCG tablet Take 1,000 mcg by mouth daily.   doxycycline  (VIBRA -TABS) 100 MG tablet Take 1 tablet (100 mg total) by mouth 2 (two) times daily.   fluticasone  (FLOVENT  HFA) 44 MCG/ACT inhaler Inhale 1 puff into the lungs daily as needed (Shortness of breath).   magic mouthwash (nystatin , hydrocortisone, diphenhydrAMINE , lidocaine ) suspension Swish and spit 5 mLs 4 (four) times daily.   methadone  (DOLOPHINE ) 5 MG tablet Take 5 mg by mouth daily.   montelukast  (SINGULAIR ) 10 MG tablet Take 1 tablet (10 mg total) by mouth at bedtime.   neomycin -polymyxin-hydrocortisone (CORTISPORIN) OTIC solution 3 drops in affected ear twice a day as needed   omeprazole (PRILOSEC) 20 MG capsule Take 20 mg by mouth daily.   pramipexole  (MIRAPEX ) 0.5 MG tablet Take 1 tablet by mouth every evening. 4-5 pm  promethazine -dextromethorphan (PROMETHAZINE -DM) 6.25-15 MG/5ML syrup Take 2.5 mLs by mouth 4 (four) times daily as needed for cough.   SUMAtriptan  (IMITREX ) 100 MG tablet TAKE 1 TABLET BY MOUTH AT ONSET OF HEADACHE; MAY REPEAT 1 TABLET IN 2 HOURS IF NEEDED.   terbinafine  (LAMISIL ) 250 MG tablet Take 1 tablet (250 mg total) by mouth daily.   triamcinolone  cream (KENALOG ) 0.1 % Apply 1 Application topically 2 (two) times daily. (Patient taking differently: Apply 1 Application topically 2 (two) times daily as needed (itching).)   Vitamin  D, Ergocalciferol , (DRISDOL ) 1.25 MG (50000 UNIT) CAPS capsule TAKE 1 CAPSULE BY MOUTH ONCE WEEKLY   No facility-administered medications prior to visit.   Review of Systems     Objective    BP 136/64 (BP Location: Left Arm, Patient Position: Sitting, Cuff Size: Normal)   Pulse (!) 55   Resp 14   Wt 120 lb 1.6 oz (54.5 kg)   SpO2 98%   BMI 20.62 kg/m   Physical Exam   General: Appearance:    Well developed, well nourished female in no acute distress  Eyes:    PERRL, conjunctiva/corneas clear, EOM's intact       Lungs:     Clear to auscultation bilaterally, respirations unlabored  Heart:    Bradycardic. Normal rhythm.  2/6 systolic murmur   MS:   All extremities are intact.  Pain reproduced by palpation left anterior rib.   Neurologic:   Awake, alert, oriented x 3. No apparent focal neurological defect.             Assessment & Plan       Left-sided rib pain Imaging ruled out fractures; likely muscular from gardening. - Continue using heat therapy for pain relief.  Right knee pain likely due to osteoarthritis Suspected osteoarthritis managed with ibuprofen . Advised orthopedic consultation if pain affects activity. - Consider referral to an orthopedist if knee pain worsens or affects activity level.  Tick bite with mild fever and headache Likely [place] tick bite with mild symptoms, not indicative of tick-borne illness. - Monitor for worsening symptoms or development of a rash. - Report any persistent fever or new symptoms.  Hypertension Blood pressure well-controlled with current medications.  Osteoporosis Stable bone density on Fosamax . - Continue Fosamax  as prescribed.  Fatigue Suggested B vitamins for energy. - Start B complex vitamin supplementation.  General Health Maintenance Recent normal mammogram, taking vitamin D , upcoming physical scheduled. - Schedule physical exam in December. - Perform annual labs during the December visit.    Return in  about 6 months (around 06/11/2024) for Yearly Physical.     Nancyann Perry, MD  [facility] [phone] (phone) [phone] (fax)  [facility]

## 2024-01-30 DIAGNOSIS — F419 Anxiety disorder, unspecified: Secondary | ICD-10-CM | POA: Diagnosis not present

## 2024-01-30 DIAGNOSIS — Z9889 Other specified postprocedural states: Secondary | ICD-10-CM | POA: Diagnosis not present

## 2024-01-30 DIAGNOSIS — K591 Functional diarrhea: Secondary | ICD-10-CM | POA: Diagnosis not present

## 2024-01-30 DIAGNOSIS — K2270 Barrett's esophagus without dysplasia: Secondary | ICD-10-CM | POA: Diagnosis not present

## 2024-01-30 DIAGNOSIS — Z8719 Personal history of other diseases of the digestive system: Secondary | ICD-10-CM | POA: Diagnosis not present

## 2024-01-31 DIAGNOSIS — I341 Nonrheumatic mitral (valve) prolapse: Secondary | ICD-10-CM | POA: Diagnosis not present

## 2024-01-31 DIAGNOSIS — I10 Essential (primary) hypertension: Secondary | ICD-10-CM | POA: Diagnosis not present

## 2024-01-31 DIAGNOSIS — I77810 Thoracic aortic ectasia: Secondary | ICD-10-CM | POA: Diagnosis not present

## 2024-02-08 DIAGNOSIS — G2581 Restless legs syndrome: Secondary | ICD-10-CM | POA: Diagnosis not present

## 2024-02-08 DIAGNOSIS — R200 Anesthesia of skin: Secondary | ICD-10-CM | POA: Diagnosis not present

## 2024-03-13 ENCOUNTER — Encounter: Admitting: Family Medicine

## 2024-04-08 ENCOUNTER — Encounter: Payer: Self-pay | Admitting: Family Medicine

## 2024-04-08 ENCOUNTER — Ambulatory Visit: Admitting: Physician Assistant

## 2024-04-08 ENCOUNTER — Ambulatory Visit (INDEPENDENT_AMBULATORY_CARE_PROVIDER_SITE_OTHER): Admitting: Family Medicine

## 2024-04-08 VITALS — BP 164/65 | HR 61 | Temp 98.9°F | Ht 63.5 in | Wt 122.3 lb

## 2024-04-08 DIAGNOSIS — J029 Acute pharyngitis, unspecified: Secondary | ICD-10-CM

## 2024-04-08 DIAGNOSIS — J069 Acute upper respiratory infection, unspecified: Secondary | ICD-10-CM

## 2024-04-08 MED ORDER — DOXYCYCLINE HYCLATE 100 MG PO TABS: 100.0000 mg | ORAL_TABLET | Freq: Two times a day (BID) | ORAL | 0 refills | Status: DC

## 2024-04-08 NOTE — Progress Notes (Signed)
 ACUTE VISIT   Patient: Heidi Hernandez   DOB: 09/20/1950   73 y.o. Female  MRN: [id number]   PCP: Gasper Nancyann BRAVO, MD  Chief Complaint  Patient presents with   Acute Visit    Patient reports it looks like she is forming a white spot on her throat and the left side looks red. She also reports symptoms of congestion, post nasal drip and drainage that is causing a cough w/ phlegm that is dark green. She reports hx of bronchitis, no spleen and has not felt well for 4 days   Subjective    HPI HPI     Acute Visit    Additional comments: Patient reports it looks like she is forming a white spot on her throat and the left side looks red. She also reports symptoms of congestion, post nasal drip and drainage that is causing a cough w/ phlegm that is dark green. She reports hx of bronchitis, no spleen and has not felt well for 4 days      Last edited by Heidi Hernandez, Heidi Hernandez on 04/08/2024 11:27 AM.       Discussed the use of AI scribe software for clinical note transcription with the patient, who gave verbal consent to proceed.  History of Present Illness Heidi Hernandez is a 73 year old female with asplenia who presents with a sore throat and congestion.  She has been experiencing a sore throat and congestion for four days. The sore throat is red, and the patient reports a white spot present. She also has postnasal drip and phlegm production.  She experiences a headache that comes and goes, and a mild fever of 62F, which is elevated for her as her normal temperature is 97.52F. No high fevers are reported. She maintains normal taste and smell.  Her medical history includes bronchitis and sinus infections, which develop rapidly. She is allergic to Augmentin  and morphine . She does not tolerate prednisone  well, as it causes her to feel sick. In the past, she has been treated with doxycycline  for similar symptoms.     Medications: Outpatient Medications Prior to Visit   Medication Sig   albuterol  (VENTOLIN  HFA) 108 (90 Base) MCG/ACT inhaler Inhale 2 puffs into the lungs every 6 (six) hours as needed for wheezing. May substitute generic or equivalent brand name   alendronate  (FOSAMAX ) 70 MG tablet TAKE 1 TABLET BY MOUTH ONCE WEEKLY ON EMTPY STOMACH BEFORE BREAKFAST. REMAIN UPRIGHT FOR 30 MINUTES AND TAKE WITH 8 OUNCES OF WATER   amLODipine  (NORVASC ) 10 MG tablet TAKE 1 TABLET BY MOUTH DAILY   atenolol  (TENORMIN ) 25 MG tablet TAKE 1 TABLET BY MOUTH DAILY   cetirizine  (ZYRTEC ) 10 MG tablet Take 1 tablet (10 mg total) by mouth daily.   cyanocobalamin  (VITAMIN B12) 1000 MCG tablet Take 1,000 mcg by mouth daily.   fluticasone  (FLOVENT  HFA) 44 MCG/ACT inhaler Inhale 1 puff into the lungs daily as needed (Shortness of breath).   magic mouthwash (nystatin , hydrocortisone, diphenhydrAMINE , lidocaine ) suspension Swish and spit 5 mLs 4 (four) times daily.   methadone  (DOLOPHINE ) 5 MG tablet Take 5 mg by mouth daily.   montelukast  (SINGULAIR ) 10 MG tablet Take 1 tablet (10 mg total) by mouth at bedtime.   neomycin -polymyxin-hydrocortisone (CORTISPORIN) OTIC solution 3 drops in affected ear twice a day as needed   pramipexole  (MIRAPEX ) 0.5 MG tablet Take 1 tablet by mouth every evening. 4-5 pm   promethazine -dextromethorphan (PROMETHAZINE -DM) 6.25-15 MG/5ML syrup Take 2.5 mLs  by mouth 4 (four) times daily as needed for cough.   SUMAtriptan  (IMITREX ) 100 MG tablet TAKE 1 TABLET BY MOUTH AT ONSET OF HEADACHE; MAY REPEAT 1 TABLET IN 2 HOURS IF NEEDED.   triamcinolone  cream (KENALOG ) 0.1 % Apply 1 Application topically 2 (two) times daily. (Patient taking differently: Apply 1 Application topically 2 (two) times daily as needed (itching).)   Vitamin D , Ergocalciferol , (DRISDOL ) 1.25 MG (50000 UNIT) CAPS capsule TAKE 1 CAPSULE BY MOUTH ONCE WEEKLY   benzonatate  (TESSALON ) 100 MG capsule Take 1 capsule (100 mg total) by mouth 2 (two) times daily as needed for cough. (Patient not  taking: Reported on 04/08/2024)   celecoxib  (CELEBREX ) 200 MG capsule TAKE 1 CAPSULE BY MOUTH 2 TIMES A DAY (Patient not taking: Reported on 04/08/2024)   doxycycline  (VIBRA -TABS) 100 MG tablet Take 1 tablet (100 mg total) by mouth 2 (two) times daily. (Patient not taking: Reported on 04/08/2024)   terbinafine  (LAMISIL ) 250 MG tablet Take 1 tablet (250 mg total) by mouth daily. (Patient not taking: Reported on 04/08/2024)   No facility-administered medications prior to visit.        Objective    BP (!) 164/65 (BP Location: Left Arm, Patient Position: Sitting, Cuff Size: Normal)   Pulse 61   Temp 98.9 F (37.2 C) (Oral)   Ht 5' 3.5 (1.613 m)   Wt 122 lb 4.8 oz (55.5 kg)   SpO2 99%   BMI 21.32 kg/m    Physical Exam   Physical Exam VITALS: BP- 164/65 HEENT: Erythematous oropharynx with postnasal drip. NECK: Enlarged right anterior cervical lymph nodes and submandibular lymph node enlargement.   No results found for any visits on 04/08/24.  Assessment & Plan     Assessment and Plan Assessment & Plan Acute pharyngitis Erythematous oropharynx with postnasal drip and right anterior cervical lymphadenopathy. Symptoms include congestion, postnasal drip, and headache for four days. Negative rapid strep test. Likely viral etiology given the clinical presentation and negative strep test. Due to her immunodeficiency, a conditional plan for antibiotics was discussed if symptoms do not improve. 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 Discussed the importance of monitoring symptoms closely and the potential need for antibiotics if symptoms worsen or do not  improve.  Hypertension Elevated blood pressure reading of 164/65. - Follow up with primary care provider for hypertension management -continue amlodipine  10mg  daily  -continue atenolol  25mg  every day       No follow-ups on file.        Rockie Agent, MD  [facility] [phone] (phone) [phone] (fax)  [facility]

## 2024-04-08 NOTE — Patient Instructions (Signed)
 To keep you healthy, please keep in mind the following health maintenance items that you are due for:   Health Maintenance Due  Topic Date Due   Hepatitis C Screening  Never done   Zoster Vaccines- Shingrix (1 of 2) Never done   Meningococcal B Vaccine (2 of 4 - Increased Risk Bexsero 3-dose series) 11/16/2016   DTaP/Tdap/Td (2 - Td or Tdap) 05/16/2021   COVID-19 Vaccine (4 - 2025-26 season) 02/26/2024   Medicare Annual Wellness (AWV)  06/05/2024     Best Wishes,   Dr. Lang

## 2024-04-26 DIAGNOSIS — M65341 Trigger finger, right ring finger: Secondary | ICD-10-CM | POA: Diagnosis not present

## 2024-04-30 ENCOUNTER — Other Ambulatory Visit: Payer: Self-pay | Admitting: Family Medicine

## 2024-04-30 DIAGNOSIS — M810 Age-related osteoporosis without current pathological fracture: Secondary | ICD-10-CM

## 2024-05-30 ENCOUNTER — Encounter: Payer: Self-pay | Admitting: Surgery

## 2024-06-11 ENCOUNTER — Ambulatory Visit: Payer: Self-pay

## 2024-06-11 DIAGNOSIS — Z0000 Encounter for general adult medical examination without abnormal findings: Secondary | ICD-10-CM

## 2024-06-11 NOTE — Progress Notes (Signed)
 Chief Complaint  Patient presents with   Medicare Wellness     Subjective:   Heidi Hernandez is a 73 y.o. female who presents for a Medicare Annual Wellness Visit.  Visit info / Clinical Intake: Medicare Wellness Visit Type:: Subsequent Annual Wellness Visit Persons participating in visit and providing information:: patient Medicare Wellness Visit Mode:: Video Since this visit was completed virtually, some vitals may be partially provided or unavailable. Missing vitals are due to the limitations of the virtual format.: Unable to obtain vitals - no equipment If Telephone or Video please confirm:: I connected with patient using audio/video enable telemedicine. I verified patient identity with two identifiers, discussed telehealth limitations, and patient agreed to proceed. Patient Location:: HOME Provider Location:: OFFICE Interpreter Needed?: No Pre-visit prep was completed: yes AWV questionnaire completed by patient prior to visit?: yes Date:: 06/10/24 Living arrangements:: lives with spouse/significant other Patient's Overall Health Status Rating: good Typical amount of pain: some Does pain affect daily life?: no Are you currently prescribed opioids?: no  Dietary Habits and Nutritional Risks How many meals a day?: 4 (SMALLER MEALS) Eats fruit and vegetables daily?: yes Most meals are obtained by: preparing own meals In the last 2 weeks, have you had any of the following?: (!) nausea, vomiting, diarrhea (DIARRHEA) Diabetic:: no  Functional Status Activities of Daily Living (to include ambulation/medication): Independent Ambulation: Independent Medication Administration: Independent Home Management (perform basic housework or laundry): Independent Manage your own finances?: yes Primary transportation is: driving Concerns about vision?: no *vision screening is required for [facility]* (WEARS GLASSES ALL DAY- [facility]) Concerns about hearing?: no  Fall Screening Falls in  the past year?: 0 Number of falls in past year: 0 Was there an injury with Fall?: 0 Fall Risk Category Calculator: 0 Patient Fall Risk Level: Low Fall Risk  Fall Risk Patient at Risk for Falls Due to: No Fall Risks Fall risk Follow up: Falls evaluation completed; Falls prevention discussed  Home and Transportation Safety: All rugs have non-skid backing?: yes All stairs or steps have railings?: yes Grab bars in the bathtub or shower?: yes Have non-skid surface in bathtub or shower?: yes (USES SHOWER CHAIR) Good home lighting?: yes Regular seat belt use?: yes Hospital stays in the last year:: no  Cognitive Assessment Difficulty concentrating, remembering, or making decisions? : no Will 6CIT or Mini Cog be Completed: yes What year is it?: 0 points What month is it?: 0 points Give patient an address phrase to remember (5 components): [address] [place], [place] About what time is it?: 0 points Count backwards from 20 to 1: 0 points Say the months of the year in reverse: 0 points Repeat the address phrase from earlier: 0 points 6 CIT Score: 0 points  Advance Directives (For Healthcare) Does Patient Have a Medical Advance Directive?: No Would patient like information on creating a medical advance directive?: No - Patient declined  Reviewed/Updated  Reviewed/Updated: Reviewed All (Medical, Surgical, Family, Medications, Allergies, Care Teams, Patient Goals)    Allergies (verified) Alpha-lipoic acid, Augmentin  [amoxicillin -pot clavulanate], Cat dander, Dog epithelium (canis lupus familiaris), Droperidol, Molds & smuts, Paroxetine, Paroxetine hcl, Tree extract, and Morphine    Current Medications (verified) Outpatient Encounter Medications as of 06/11/2024  Medication Sig   albuterol  (VENTOLIN  HFA) 108 (90 Base) MCG/ACT inhaler Inhale 2 puffs into the lungs every 6 (six) hours as needed for wheezing. May substitute generic or equivalent brand name   alendronate  (FOSAMAX ) 70 MG  tablet TAKE 1 TABLET BY MOUTH  ONCE WEEKLY ON A EMPTY STOMACH BEFORE BREAKFAST. REMAIN UPRIGHT FOR 30 MINUTES AND TAKE WITH 8 OUNCES OF WATER   amLODipine  (NORVASC ) 10 MG tablet TAKE 1 TABLET BY MOUTH DAILY   atenolol  (TENORMIN ) 25 MG tablet TAKE 1 TABLET BY MOUTH DAILY   cetirizine  (ZYRTEC ) 10 MG tablet Take 1 tablet (10 mg total) by mouth daily.   cyanocobalamin  (VITAMIN B12) 1000 MCG tablet Take 1,000 mcg by mouth daily.   fluticasone  (FLOVENT  HFA) 44 MCG/ACT inhaler Inhale 1 puff into the lungs daily as needed (Shortness of breath).   magic mouthwash (nystatin , hydrocortisone, diphenhydrAMINE , lidocaine ) suspension Swish and spit 5 mLs 4 (four) times daily.   methadone  (DOLOPHINE ) 5 MG tablet Take 5 mg by mouth daily.   montelukast  (SINGULAIR ) 10 MG tablet Take 1 tablet (10 mg total) by mouth at bedtime.   neomycin -polymyxin-hydrocortisone (CORTISPORIN) OTIC solution 3 drops in affected ear twice a day as needed   pramipexole  (MIRAPEX ) 0.5 MG tablet Take 1 tablet by mouth every evening. 4-5 pm   promethazine -dextromethorphan (PROMETHAZINE -DM) 6.25-15 MG/5ML syrup Take 2.5 mLs by mouth 4 (four) times daily as needed for cough.   SUMAtriptan  (IMITREX ) 100 MG tablet TAKE 1 TABLET BY MOUTH AT ONSET OF HEADACHE; MAY REPEAT 1 TABLET IN 2 HOURS IF NEEDED.   triamcinolone  cream (KENALOG ) 0.1 % Apply 1 Application topically 2 (two) times daily.   Vitamin D , Ergocalciferol , (DRISDOL ) 1.25 MG (50000 UNIT) CAPS capsule TAKE 1 CAPSULE BY MOUTH ONCE WEEKLY   celecoxib  (CELEBREX ) 200 MG capsule TAKE 1 CAPSULE BY MOUTH 2 TIMES A DAY (Patient not taking: Reported on 06/11/2024)   doxycycline  (VIBRA -TABS) 100 MG tablet Take 1 tablet (100 mg total) by mouth 2 (two) times daily. (Patient not taking: Reported on 06/11/2024)   No facility-administered encounter medications on file as of 06/11/2024.    History: Past Medical History:  Diagnosis Date   Allergy trees, dogs, cats, mold   Take allergy shots    Anxiety    Aorta disorder    pt unsure but gets followed at [facility] cardiology for this   Arthritis    Asthma    GERD (gastroesophageal reflux disease)    Headache    Migraines   Heart murmur    Hypertension    Idiopathic thrombocytopenia purpura (HCC)    Required splenectomy   MVP (mitral valve prolapse)    Neuromuscular disorder (HCC)    Restless Leg Syndrome   Osteoporosis    Pancreatitis April 2017 [facility] due to cholecystitis.    Pneumonia    06-2015   Past Surgical History:  Procedure Laterality Date   ABDOMINAL HYSTERECTOMY     APPENDECTOMY     CARPAL TUNNEL RELEASE Bilateral 11/19/2014   Procedure: CARPAL TUNNEL RELEASE;  Surgeon: Lynwood SHAUNNA Hue, MD;  Location: [facility] ORS;  Service: Orthopedics;  Laterality: Bilateral;   CESAREAN SECTION     times 2   CHOLECYSTECTOMY  09/29/2015   [facility]   COLONOSCOPY N/A 05/02/2016   Procedure: COLONOSCOPY;  Surgeon: Lamar ONEIDA Holmes, MD;  Location: [facility] ENDOSCOPY;  Service: Endoscopy;  Laterality: N/A;   ESOPHAGOGASTRODUODENOSCOPY (EGD) WITH PROPOFOL  N/A 05/02/2016   Procedure: ESOPHAGOGASTRODUODENOSCOPY (EGD) WITH PROPOFOL ;  Surgeon: Lamar ONEIDA Holmes, MD;  Location: [facility] ENDOSCOPY;  Service: Endoscopy;  Laterality: N/A;   ESOPHAGOGASTRODUODENOSCOPY (EGD) WITH PROPOFOL  N/A 11/22/2017   Procedure: ESOPHAGOGASTRODUODENOSCOPY (EGD) WITH PROPOFOL ;  Surgeon: Holmes Lamar ONEIDA, MD;  Location: [facility] ENDOSCOPY;  Service: Endoscopy;  Laterality: N/A;   EYE SURGERY  cataract repair   don't know date   HERNIA REPAIR     times 3   INSERTION OF MESH  07/05/2022   Procedure: INSERTION OF MESH;  Surgeon: Jordis Laneta FALCON, MD;  Location: [facility] ORS;  Service: General;;   JOINT REPLACEMENT     OVARIAN CYST SURGERY     SHOULDER ARTHROSCOPY WITH ROTATOR CUFF REPAIR     times 2   SPLENECTOMY, TOTAL     TOTAL SHOULDER ARTHROPLASTY Right 04/21/2017   Procedure: REVERSE SHOULDER ARTHROPLASTY;  Surgeon: Tobie Priest, MD;  Location: [facility] ORS;  Service:  Orthopedics;  Laterality: Right;   TRIGGER FINGER RELEASE Right 11/19/2014   Procedure: RELEASE TRIGGER FINGER/A-1 PULLEY;  Surgeon: Lynwood SHAUNNA Hue, MD;  Location: [facility] ORS;  Service: Orthopedics;  Laterality: Right;   TRIGGER FINGER RELEASE Right 08/12/2015   Procedure: RELEASE TRIGGER LONG FINGER;  Surgeon: Lynwood SHAUNNA Hue, MD;  Location: [facility] ORS;  Service: Orthopedics;  Laterality: Right;   TUBAL LIGATION  complete hysterectomy now   VENTRAL HERNIA REPAIR N/A 07/20/2023   Procedure: HERNIA REPAIR VENTRAL ADULT, open;  Surgeon: Jordis Laneta FALCON, MD;  Location: [facility] ORS;  Service: General;  Laterality: N/A;   XI ROBOTIC ASSISTED PARAESOPHAGEAL HERNIA REPAIR  07/05/2022   Procedure: XI ROBOTIC ASSISTED PARAESOPHAGEAL HERNIA REPAIR;  Surgeon: Jordis Laneta FALCON, MD;  Location: [facility] ORS;  Service: General;;   Family History  Problem Relation Age of Onset   Hypertension Mother    Renal Disease Father        ESRD with HD   Breast cancer Neg Hx    Social History   Occupational History   Occupation: Retired  Tobacco Use   Smoking status: Former    Current packs/day: 0.00    Average packs/day: 1 pack/day for 10.0 years (10.0 ttl pk-yrs)    Types: Cigarettes    Start date: 11/09/1969    Quit date: 11/10/1979    Years since quitting: 44.6    Passive exposure: Past   Smokeless tobacco: Never  Vaping Use   Vaping status: Never Used  Substance and Sexual Activity   Alcohol use: Not Currently    Alcohol/week: 0.0 standard drinks of alcohol   Drug use: No   Sexual activity: Never   Tobacco Counseling Counseling given: Not Answered  SDOH Screenings   Food Insecurity: No Food Insecurity (06/11/2024)  Housing: Unknown (06/11/2024)  Transportation Needs: No Transportation Needs (06/11/2024)  Utilities: Not At Risk (06/11/2024)  Alcohol Screen: Low Risk (06/06/2023)  Depression (PHQ2-9): Low Risk (06/11/2024)  Financial Resource Strain: Low Risk (06/10/2024)  Physical Activity: Insufficiently  Active (06/11/2024)  Social Connections: Moderately Integrated (06/11/2024)  Stress: No Stress Concern Present (06/11/2024)  Tobacco Use: Medium Risk (06/11/2024)  Health Literacy: Adequate Health Literacy (06/11/2024)   See flowsheets for full screening details  Depression Screen PHQ 2 & 9 Depression Scale- Over the past 2 weeks, how often have you been bothered by any of the following problems? Little interest or pleasure in doing things: 0 Feeling down, depressed, or hopeless (PHQ Adolescent also includes...irritable): 0 PHQ-2 Total Score: 0 Trouble falling or staying asleep, or sleeping too much: 0 Feeling tired or having little energy: 0 Poor appetite or overeating (PHQ Adolescent also includes...weight loss): 0 Feeling bad about yourself - or that you are a failure or have let yourself or your family down: 0 Trouble concentrating on things, such as reading the newspaper or watching television (PHQ Adolescent also includes...like school work): 0 Moving or speaking so  slowly that other people could have noticed. Or the opposite - being so fidgety or restless that you have been moving around a lot more than usual: 0 Thoughts that you would be better off dead, or of hurting yourself in some way: 0 PHQ-9 Total Score: 0 If you checked off any problems, how difficult have these problems made it for you to do your work, take care of things at home, or get along with other people?: Not difficult at all  Depression Treatment Depression Interventions/Treatment : PHQ2-9 Score <4 Follow-up Not Indicated     Goals Addressed             This Visit's Progress    DIET - REDUCE SALT INTAKE TO 2 GRAMS PER DAY OR LESS               Objective:    There were no vitals filed for this visit. There is no height or weight on file to calculate BMI.  Hearing/Vision screen Hearing Screening - Comments:: NO AIDS Vision Screening - Comments:: WEARS GLASSES ALL DAY- [facility] Immunizations  and Health Maintenance Health Maintenance  Topic Date Due   Hepatitis C Screening  Never done   Zoster Vaccines- Shingrix (1 of 2) Never done   Meningococcal B Vaccine (2 of 4 - Increased Risk Bexsero 3-dose series) 11/16/2016   DTaP/Tdap/Td (2 - Td or Tdap) 05/16/2021   COVID-19 Vaccine (4 - 2025-26 season) 02/26/2024   Influenza Vaccine  09/24/2024 (Originally 01/26/2024)   Mammogram  12/05/2024   Medicare Annual Wellness (AWV)  06/11/2025   Bone Density Scan  12/05/2025   Colonoscopy  05/02/2026   Pneumococcal Vaccine: 50+ Years  Completed        Assessment/Plan:  This is a routine wellness examination for Marilea.  Patient Care Team: Gasper Nancyann BRAVO, MD as PCP - General (Family Medicine) Cheryn Nickels, MD as Referring Physician (Allergy and Immunology) Lenn Standing, MD as Consulting Physician (Ophthalmology) Samuella Twyla CHRISTELLA DEVONNA (Gastroenterology) Alfredo Caldron, MD as Referring Physician (Neurology) Lane Arthea BRAVO, MD as Referring Physician (Neurology)  I have personally reviewed and noted the following in the patients chart:   Medical and social history Use of alcohol, tobacco or illicit drugs  Current medications and supplements including opioid prescriptions. Functional ability and status Nutritional status Physical activity Advanced directives List of other physicians Hospitalizations, surgeries, and ER visits in previous 12 months Vitals Screenings to include cognitive, depression, and falls Referrals and appointments  No orders of the defined types were placed in this encounter.  In addition, I have reviewed and discussed with patient certain preventive protocols, quality metrics, and best practice recommendations. A written personalized care plan for preventive services as well as general preventive health recommendations were provided to patient.   Jhonnie GORMAN Das, LPN   [id number]   Return in 1 year (on 06/11/2025).  After Visit Summary:  (MyChart) Due to this being a telephonic visit, the after visit summary with patients personalized plan was offered to patient via MyChart   Nurse Notes: UTD ON SHOTS EXCEPT TDAP; UTD ON MAMMOGRAM & COLONOSCOPY & BDS

## 2024-06-11 NOTE — Patient Instructions (Addendum)
 Heidi Hernandez,  Thank you for taking the time for your Medicare Wellness Visit. I appreciate your continued commitment to your health goals. Please review the care plan we discussed, and feel free to reach out if I can assist you further.  Please note that Annual Wellness Visits do not include a physical exam. Some assessments may be limited, especially if the visit was conducted virtually. If needed, we may recommend an in-person follow-up with your provider.  Ongoing Care Seeing your primary care provider every 3 to 6 months helps us  monitor your health and provide consistent, personalized care.   Referrals If a referral was made during today's visit and you haven't received any updates within two weeks, please contact the referred provider directly to check on the status.  Recommended Screenings:  Health Maintenance  Topic Date Due   Hepatitis C Screening  Never done   Zoster (Shingles) Vaccine (1 of 2) Never done   Meningitis B Vaccine (2 of 4 - Increased Risk Bexsero 3-dose series) 11/16/2016   DTaP/Tdap/Td vaccine (2 - Td or Tdap) 05/16/2021   COVID-19 Vaccine (4 - 2025-26 season) 02/26/2024   Flu Shot  09/24/2024*   Breast Cancer Screening  12/05/2024   Medicare Annual Wellness Visit  06/11/2025   Osteoporosis screening with Bone Density Scan  12/05/2025   Colon Cancer Screening  05/02/2026   Pneumococcal Vaccine for age over 43  Completed  *Topic was postponed. The date shown is not the original due date.    Vision: Annual vision screenings are recommended for early detection of glaucoma, cataracts, and diabetic retinopathy. These exams can also reveal signs of chronic conditions such as diabetes and high blood pressure.  Dental: Annual dental screenings help detect early signs of oral cancer, gum disease, and other conditions linked to overall health, including heart disease and diabetes.  Please see the attached documents for additional preventive care recommendations.    NEXT AWV 06/17/25 @ 2:30 PM BY VIDEO

## 2024-06-12 ENCOUNTER — Ambulatory Visit: Admitting: Family Medicine

## 2024-06-12 ENCOUNTER — Encounter: Payer: Self-pay | Admitting: Family Medicine

## 2024-06-12 VITALS — BP 137/64 | HR 54 | Ht 64.0 in | Wt 125.6 lb

## 2024-06-12 DIAGNOSIS — E559 Vitamin D deficiency, unspecified: Secondary | ICD-10-CM | POA: Diagnosis not present

## 2024-06-12 DIAGNOSIS — Z0000 Encounter for general adult medical examination without abnormal findings: Secondary | ICD-10-CM | POA: Diagnosis not present

## 2024-06-12 DIAGNOSIS — I10 Essential (primary) hypertension: Secondary | ICD-10-CM

## 2024-06-12 DIAGNOSIS — G4700 Insomnia, unspecified: Secondary | ICD-10-CM

## 2024-06-12 DIAGNOSIS — I341 Nonrheumatic mitral (valve) prolapse: Secondary | ICD-10-CM | POA: Diagnosis not present

## 2024-06-12 DIAGNOSIS — E538 Deficiency of other specified B group vitamins: Secondary | ICD-10-CM

## 2024-06-12 MED ORDER — NEOMYCIN-POLYMYXIN-HC 3.5-10000-1 OT SOLN: OTIC | 1 refills | Status: DC

## 2024-06-12 NOTE — Progress Notes (Signed)
 Complete physical exam   Patient: Heidi Hernandez   DOB: 06/11/1951   73 y.o. Female  MRN: [id number] Visit Date: 06/12/2024  Today's healthcare provider: Nancyann Perry, MD   Chief Complaint  Patient presents with   Annual Exam    Pt was seen for AWV on 06/11/24   Subjective    Discussed the use of AI scribe software for clinical note transcription with the patient, who gave verbal consent to proceed.  History of Present Illness   Heidi Hernandez is a 73 year old female who presents for an annual physical exam and follow-up of her chronic medical problems.  She is considering a cortisone injection and has a potential appointment scheduled for January.  Her arthritis is severe, with a new trigger finger and worsening symptoms during the winter months. She uses Voltaren gel on her knee and fingers, which she finds effective, and inquires about its safety for these areas. She no longer takes Celebrex  regularly but considers it for severe arthritis pain.  She reports her ears are 'real yolky inside' and requests a prescription for cortisporin drops, as her current prescription has expired.  She recalls a past hernia that was identified through a scan and has since been treated.  Her aorta is starting to split slightly, and she reports that her doctor said it is progressing slowly and is not expected to cause problems. She has a family history of aortic issues, as her mother had a similar condition.  Her current medications include Fosamax , amlodipine , atenolol , methadone , Mirapex , vitamin D , and B12. Her blood pressure is slightly elevated, typically around 140/65-70, with previous adjustments to her medication regimen.  No chest pain, heart flutter, or shortness of breath. Her asthma is under control, and she has not been using her inhalers.   Lab Results  Component Value Date   VITAMINB12 545 05/11/2023   Lab Results  Component Value Date   NA 141 12/04/2023   K 4.0  12/04/2023   CREATININE 0.57 12/04/2023   GFRNONAA >60 12/04/2023   GLUCOSE 99 12/04/2023   Lab Results  Component Value Date   WBC 8.9 12/04/2023   HGB 13.6 12/04/2023   HCT 39.1 12/04/2023   MCV 93.8 12/04/2023   PLT 116 (L) 12/04/2023   Lab Results  Component Value Date   CHOL 166 05/11/2023   HDL 81 05/11/2023   LDLCALC 66 05/11/2023   TRIG 106 05/11/2023   CHOLHDL 2.0 05/11/2023       HPI     Annual Exam    Additional comments: Pt was seen for AWV on 06/11/24      Last edited by Denise Jenni CHRISTELLA on 06/12/2024  9:37 AM.       Past Medical History:  Diagnosis Date   Allergy trees, dogs, cats, mold   Take allergy shots   Anxiety    Aorta disorder    pt unsure but gets followed at [facility]ology for this   Arthritis    Asthma    GERD (gastroesophageal reflux disease)    Headache    Migraines   Heart murmur    Hypertension    Idiopathic thrombocytopenia purpura (HCC)    Required splenectomy   MVP (mitral valve prolapse)    Neuromuscular disorder (HCC)    Restless Leg Syndrome   Osteoporosis    Pancreatitis April 2017 [facility] due to cholecystitis.    Pneumonia    06-2015   Past Surgical History:  Procedure Laterality Date   ABDOMINAL HYSTERECTOMY     APPENDECTOMY     CARPAL TUNNEL RELEASE Bilateral 11/19/2014   Procedure: CARPAL TUNNEL RELEASE;  Surgeon: Lynwood SHAUNNA Hue, MD;  Location: [facility] ORS;  Service: Orthopedics;  Laterality: Bilateral;   CESAREAN SECTION     times 2   CHOLECYSTECTOMY  09/29/2015   [facility]   COLONOSCOPY N/A 05/02/2016   Procedure: COLONOSCOPY;  Surgeon: Lamar ONEIDA Holmes, MD;  Location: [facility] ENDOSCOPY;  Service: Endoscopy;  Laterality: N/A;   ESOPHAGOGASTRODUODENOSCOPY (EGD) WITH PROPOFOL  N/A 05/02/2016   Procedure: ESOPHAGOGASTRODUODENOSCOPY (EGD) WITH PROPOFOL ;  Surgeon: Lamar ONEIDA Holmes, MD;  Location: [facility] ENDOSCOPY;  Service: Endoscopy;  Laterality: N/A;   ESOPHAGOGASTRODUODENOSCOPY (EGD) WITH PROPOFOL  N/A 11/22/2017    Procedure: ESOPHAGOGASTRODUODENOSCOPY (EGD) WITH PROPOFOL ;  Surgeon: Holmes Lamar ONEIDA, MD;  Location: [facility] ENDOSCOPY;  Service: Endoscopy;  Laterality: N/A;   EYE SURGERY  cataract repair   don't know date   HERNIA REPAIR     times 3   INSERTION OF MESH  07/05/2022   Procedure: INSERTION OF MESH;  Surgeon: Jordis Laneta FALCON, MD;  Location: [facility] ORS;  Service: General;;   JOINT REPLACEMENT     OVARIAN CYST SURGERY     SHOULDER ARTHROSCOPY WITH ROTATOR CUFF REPAIR     times 2   SPLENECTOMY, TOTAL     TOTAL SHOULDER ARTHROPLASTY Right 04/21/2017   Procedure: REVERSE SHOULDER ARTHROPLASTY;  Surgeon: Tobie Priest, MD;  Location: [facility] ORS;  Service: Orthopedics;  Laterality: Right;   TRIGGER FINGER RELEASE Right 11/19/2014   Procedure: RELEASE TRIGGER FINGER/A-1 PULLEY;  Surgeon: Lynwood SHAUNNA Hue, MD;  Location: [facility] ORS;  Service: Orthopedics;  Laterality: Right;   TRIGGER FINGER RELEASE Right 08/12/2015   Procedure: RELEASE TRIGGER LONG FINGER;  Surgeon: Lynwood SHAUNNA Hue, MD;  Location: [facility] ORS;  Service: Orthopedics;  Laterality: Right;   TUBAL LIGATION  complete hysterectomy now   VENTRAL HERNIA REPAIR N/A 07/20/2023   Procedure: HERNIA REPAIR VENTRAL ADULT, open;  Surgeon: Jordis Laneta FALCON, MD;  Location: [facility] ORS;  Service: General;  Laterality: N/A;   XI ROBOTIC ASSISTED PARAESOPHAGEAL HERNIA REPAIR  07/05/2022   Procedure: XI ROBOTIC ASSISTED PARAESOPHAGEAL HERNIA REPAIR;  Surgeon: Jordis Laneta FALCON, MD;  Location: [facility] ORS;  Service: General;;   Social History   Socioeconomic History   Marital status: Married    Spouse name: Marcey   Number of children: 2   Years of education: Not on file   Highest education level: Bachelor's degree (e.g., BA, AB, BS)  Occupational History   Occupation: Retired  Tobacco Use   Smoking status: Former    Current packs/day: 0.00    Average packs/day: 1 pack/day for 10.0 years (10.0 ttl pk-yrs)    Types: Cigarettes    Start date: 11/09/1969    Quit date:  11/10/1979    Years since quitting: 44.6    Passive exposure: Past   Smokeless tobacco: Never  Vaping Use   Vaping status: Never Used  Substance and Sexual Activity   Alcohol use: Not Currently    Alcohol/week: 0.0 standard drinks of alcohol   Drug use: No   Sexual activity: Never  Other Topics Concern   Not on file  Social History Narrative   Not on file   Social Drivers of Health   Tobacco Use: Medium Risk (06/12/2024)   Patient History    Smoking Tobacco Use: Former    Smokeless Tobacco Use: Never    Passive Exposure: Past  Physicist, Medical  Strain: Low Risk (06/10/2024)   Overall Financial Resource Strain (CARDIA)    Difficulty of Paying Living Expenses: Not hard at all  Food Insecurity: No Food Insecurity (06/11/2024)   Epic    Worried About Programme Researcher, Broadcasting/film/video in the Last Year: Never true    Ran Out of Food in the Last Year: Never true  Transportation Needs: No Transportation Needs (06/11/2024)   Epic    Lack of Transportation (Medical): No    Lack of Transportation (Non-Medical): No  Physical Activity: Insufficiently Active (06/11/2024)   Exercise Vital Sign    Days of Exercise per Week: 3 days    Minutes of Exercise per Session: 20 min  Stress: No Stress Concern Present (06/11/2024)   [employer] of Occupational Health - Occupational Stress Questionnaire    Feeling of Stress: Not at all  Social Connections: Moderately Integrated (06/11/2024)   Social Connection and Isolation Panel    Frequency of Communication with Friends and Family: More than three times a week    Frequency of Social Gatherings with Friends and Family: Twice a week    Attends Religious Services: More than 4 times per year    Active Member of [employer] or Organizations: No    Attends Banker Meetings: Never    Marital Status: Married  Catering Manager Violence: Not At Risk (06/11/2024)   Epic    Fear of Current or Ex-Partner: No    Emotionally Abused: No    Physically  Abused: No    Sexually Abused: No  Depression (PHQ2-9): Low Risk (06/11/2024)   Depression (PHQ2-9)    PHQ-2 Score: 0  Alcohol Screen: Low Risk (06/06/2023)   Alcohol Screen    Last Alcohol Screening Score (AUDIT): 0  Housing: Unknown (06/11/2024)   Epic    Unable to Pay for Housing in the Last Year: No    Number of Times Moved in the Last Year: Not on file    Homeless in the Last Year: No  Utilities: Not At Risk (06/11/2024)   Epic    Threatened with loss of utilities: No  Health Literacy: Adequate Health Literacy (06/11/2024)   B1300 Health Literacy    Frequency of need for help with medical instructions: Never   Family Status  Relation Name Status   Mother Rollene Moellers Deceased at age 32       aortic aneurysm   Father  Deceased at age 17       Prostate Cancer   Brother  Alive       ITP   Brother  Alive       ITP   Neg Hx  (Not Specified)  No partnership data on file   Family History  Problem Relation Age of Onset   Hypertension Mother    Renal Disease Father        ESRD with HD   Breast cancer Neg Hx    Allergies[1]  Patient Care Team: Gasper Nancyann BRAVO, MD as PCP - General (Family Medicine) Cheryn Nickels, MD as Referring Physician (Allergy and Immunology) Lenn Standing, MD as Consulting Physician (Ophthalmology) Samuella Twyla CHRISTELLA DEVONNA (Gastroenterology) Alfredo Caldron, MD as Referring Physician (Neurology) Lane Arthea BRAVO, MD as Referring Physician (Neurology)   Medications: Outpatient Medications Prior to Visit  Medication Sig   albuterol  (VENTOLIN  HFA) 108 (90 Base) MCG/ACT inhaler Inhale 2 puffs into the lungs every 6 (six) hours as needed for wheezing. May substitute generic or equivalent brand name   alendronate  (FOSAMAX ) 70 MG  tablet TAKE 1 TABLET BY MOUTH ONCE WEEKLY ON A EMPTY STOMACH BEFORE BREAKFAST. REMAIN UPRIGHT FOR 30 MINUTES AND TAKE WITH 8 OUNCES OF WATER   amLODipine  (NORVASC ) 10 MG tablet TAKE 1 TABLET BY MOUTH DAILY   atenolol   (TENORMIN ) 25 MG tablet TAKE 1 TABLET BY MOUTH DAILY   celecoxib  (CELEBREX ) 200 MG capsule TAKE 1 CAPSULE BY MOUTH 2 TIMES A DAY   cetirizine  (ZYRTEC ) 10 MG tablet Take 1 tablet (10 mg total) by mouth daily.   cyanocobalamin  (VITAMIN B12) 1000 MCG tablet Take 1,000 mcg by mouth daily.   fluticasone  (FLOVENT  HFA) 44 MCG/ACT inhaler Inhale 1 puff into the lungs daily as needed (Shortness of breath).   methadone  (DOLOPHINE ) 5 MG tablet Take 5 mg by mouth daily.   montelukast  (SINGULAIR ) 10 MG tablet Take 1 tablet (10 mg total) by mouth at bedtime.   pramipexole  (MIRAPEX ) 0.5 MG tablet Take 1 tablet by mouth every evening. 4-5 pm (Patient taking differently: Take 2 tablets by mouth every evening. 4-5 pm)   SUMAtriptan  (IMITREX ) 100 MG tablet TAKE 1 TABLET BY MOUTH AT ONSET OF HEADACHE; MAY REPEAT 1 TABLET IN 2 HOURS IF NEEDED.   triamcinolone  cream (KENALOG ) 0.1 % Apply 1 Application topically 2 (two) times daily. (Patient taking differently: Apply 1 Application topically 2 (two) times daily as needed.)   Vitamin D , Ergocalciferol , (DRISDOL ) 1.25 MG (50000 UNIT) CAPS capsule TAKE 1 CAPSULE BY MOUTH ONCE WEEKLY   neomycin -polymyxin-hydrocortisone (CORTISPORIN) OTIC solution 3 drops in affected ear twice a day as needed   No facility-administered medications prior to visit.    Review of Systems  Constitutional:  Negative for appetite change, chills, fatigue and fever.  Respiratory:  Negative for chest tightness and shortness of breath.   Cardiovascular:  Negative for chest pain and palpitations.  Gastrointestinal:  Negative for abdominal pain, nausea and vomiting.  Neurological:  Negative for dizziness and weakness.      Objective    BP 137/64 (BP Location: Left Arm, Patient Position: Sitting, Cuff Size: Normal)   Pulse (!) 54   Ht 5' 4 (1.626 m)   Wt 125 lb 9.6 oz (57 kg)   SpO2 99%   BMI 21.56 kg/m    Physical Exam  General Appearance:    Well developed, well nourished female.  Alert, cooperative, in no acute distress, appears stated age   Head:    Normocephalic, without obvious abnormality, atraumatic  Eyes:    PERRL, conjunctiva/corneas clear, EOM's intact, fundi    benign, both eyes  Ears:    Normal TM's. Seborrhea of both external ear canals, both ears  Nose:   Nares normal, septum midline, mucosa normal, no drainage    or sinus tenderness  Throat:   Lips, mucosa, and tongue normal; teeth and gums normal  Neck:   Supple, symmetrical, trachea midline, no adenopathy;    thyroid :  no enlargement/tenderness/nodules; no carotid   bruit or JVD  Back:     Symmetric, no curvature, ROM normal, no CVA tenderness  Lungs:     Clear to auscultation bilaterally, respirations unlabored  Chest Wall:    No tenderness or deformity   Heart:    Bradycardic. Normal rhythm.  2/6  Breast Exam:    deferred  Abdomen:     Soft, non-tender, bowel sounds active all four quadrants,    no masses, no organomegaly  Pelvic:    deferred  Extremities:   All extremities are intact. No cyanosis or edema  Pulses:  2+ and symmetric all extremities  Skin:   Skin color, texture, turgor normal, no rashes or lesions  Lymph nodes:   Cervical, supraclavicular, and axillary nodes normal  Neurologic:   CNII-XII intact, normal strength, sensation and reflexes    throughout    Last depression screening scores    06/11/2024    2:31 PM 12/11/2023   10:02 AM 06/12/2023    3:49 PM  PHQ 2/9 Scores  PHQ - 2 Score 0 0 0  PHQ- 9 Score 0 0  1      Data saved with a previous flowsheet row definition   Last fall risk screening    06/11/2024    2:24 PM  Fall Risk   Falls in the past year? 0  Number falls in past yr: 0  Injury with Fall? 0  Risk for fall due to : No Fall Risks  Follow up Falls evaluation completed;Falls prevention discussed   Last Audit-C alcohol use screening    06/10/2024    7:18 AM  Alcohol Use Disorder Test (AUDIT)  1. How often do you have a drink containing alcohol? 0    3. How often do you have six or more drinks on one occasion? 0      Manually entered by patient   A score of 3 or more in women, and 4 or more in men indicates increased risk for alcohol abuse, EXCEPT if all of the points are from question 1     Assessment & Plan    Routine Health Maintenance and Physical Exam  Exercise Activities and Dietary recommendations  Goals      Cut out extra servings     DIET - EAT MORE FRUITS AND VEGETABLES     DIET - EAT MORE FRUITS AND VEGETABLES     DIET - INCREASE WATER INTAKE     Recommend increasing water intake to 4 glasses a day.       DIET - REDUCE SALT INTAKE TO 2 GRAMS PER DAY OR LESS        Immunization History  Administered Date(s) Administered   Fluad Quad(high Dose 65+) 03/29/2019, 06/03/2020, 06/25/2021   Fluad Trivalent(High Dose 65+) 04/12/2023   HIB (PRP-T) 10/19/2016   INFLUENZA, HIGH DOSE SEASONAL PF 06/16/2020, 06/29/2021, 07/16/2021   Influenza,inj,Quad PF,6+ Mos 09/09/2022   Meningococcal B, OMV 10/19/2016   Meningococcal Mcv4o 10/19/2016   PFIZER(Purple Top)SARS-COV-2 Vaccination 08/18/2019, 09/10/2019, 06/16/2020   Pneumococcal Conjugate-13 10/19/2016   Pneumococcal Polysaccharide-23 04/14/2009, 04/22/2015, 04/01/2016, 06/09/2017, 01/15/2018, 06/01/2018, 06/16/2020, 06/29/2021, 07/16/2021, 09/09/2022   Tdap 05/17/2011    Health Maintenance  Topic Date Due   Hepatitis C Screening  Never done   Zoster Vaccines- Shingrix (1 of 2) Never done   Meningococcal B Vaccine (2 of 4 - Increased Risk Bexsero 3-dose series) 11/16/2016   DTaP/Tdap/Td (2 - Td or Tdap) 05/16/2021   COVID-19 Vaccine (4 - 2025-26 season) 02/26/2024   Influenza Vaccine  09/24/2024 (Originally 01/26/2024)   Mammogram  12/05/2024   Medicare Annual Wellness (AWV)  06/11/2025   Bone Density Scan  12/05/2025   Colonoscopy  05/02/2026   Pneumococcal Vaccine: 50+ Years  Completed    Discussed health benefits of physical activity, and encouraged her to  engage in regular exercise appropriate for her age and condition.   2. Essential hypertension Well controlled.  Continue current medications.   - CBC - Comprehensive metabolic panel with GFR - Lipid panel  3. Mitral valve prolapse Asymptomatic. Compliant with medication.  Continue aggressive  risk factor modification.    4. B12 deficiency  - Vitamin B12  5. Insomnia, unspecified type Secondary to RLS being adequately treated by neurology.   6. Vitamin D  deficiency  - VITAMIN D  25 Hydroxy (Vit-D Deficiency, Fractures)  7. Seborrheic dermatitis ear canals.  - neomycin -polymyxin-hydrocortisone (CORTISPORIN) OTIC solution; 3 drops in affected ear twice a day as needed  Dispense: 10 mL; Refill: 1        Nancyann Perry, MD  [facility] [phone] (phone) [phone] (fax)  [facility]     [1]  Allergies Allergen Reactions   Alpha-Lipoic Acid Nausea Only   Augmentin  [Amoxicillin -Pot Clavulanate] Nausea And Vomiting   Cat Dander     Other reaction(s): Not available   Dog Epithelium (Canis Lupus Familiaris)     Other reaction(s): Not available   Droperidol Other (See Comments)    Locked jaw Facial muscles locked Locked jaw   Molds & Smuts     Other reaction(s): Not available   Paroxetine Other (See Comments)    Passed out  Other Reaction(s): Unknown   Paroxetine Hcl Other (See Comments)    syncope Passed out    Tree Extract     Other reaction(s): Not available   Morphine  Itching

## 2024-06-12 NOTE — Patient Instructions (Signed)
Please review the attached list of medications and notify my office if there are any errors.   You are due for a Tdap (tetanus-diptheria-pertussis vaccine) which protects you from tetanus and whooping cough. Please check with your insurance plan or pharmacy regarding coverage for this vaccine.   

## 2024-06-13 ENCOUNTER — Ambulatory Visit: Payer: Self-pay | Admitting: Family Medicine

## 2024-06-13 LAB — COMPREHENSIVE METABOLIC PANEL WITH GFR
ALT: 12 IU/L (ref 0–32)
AST: 24 IU/L (ref 0–40)
Albumin: 4.6 g/dL (ref 3.8–4.8)
Alkaline Phosphatase: 65 IU/L (ref 49–135)
BUN/Creatinine Ratio: 26 (ref 12–28)
BUN: 16 mg/dL (ref 8–27)
Bilirubin Total: 0.9 mg/dL (ref 0.0–1.2)
CO2: 21 mmol/L (ref 20–29)
Calcium: 9.3 mg/dL (ref 8.7–10.3)
Chloride: 102 mmol/L (ref 96–106)
Creatinine, Ser: 0.62 mg/dL (ref 0.57–1.00)
Globulin, Total: 2 g/dL (ref 1.5–4.5)
Glucose: 78 mg/dL (ref 70–99)
Potassium: 4.1 mmol/L (ref 3.5–5.2)
Sodium: 139 mmol/L (ref 134–144)
Total Protein: 6.6 g/dL (ref 6.0–8.5)
eGFR: 93 mL/min/1.73 (ref >59)

## 2024-06-13 LAB — CBC
Hematocrit: 41.6 % (ref 34.0–46.6)
Hemoglobin: 14.2 g/dL (ref 11.1–15.9)
MCH: 32.9 pg (ref 26.6–33.0)
MCHC: 34.1 g/dL (ref 31.5–35.7)
MCV: 97 fL (ref 79–97)
Platelets: 140 x10E3/uL — ABNORMAL LOW (ref 150–450)
RBC: 4.31 x10E6/uL (ref 3.77–5.28)
RDW: 13.4 % (ref 11.7–15.4)
WBC: 6.7 x10E3/uL (ref 3.4–10.8)

## 2024-06-13 LAB — LIPID PANEL
Chol/HDL Ratio: 2.1 ratio (ref 0.0–4.4)
Cholesterol, Total: 157 mg/dL (ref 100–199)
HDL: 75 mg/dL (ref >39)
LDL Chol Calc (NIH): 68 mg/dL (ref 0–99)
Triglycerides: 73 mg/dL (ref 0–149)
VLDL Cholesterol Cal: 14 mg/dL (ref 5–40)

## 2024-06-13 LAB — VITAMIN B12: Vitamin B-12: 317 pg/mL (ref 232–1245)

## 2024-06-13 LAB — VITAMIN D 25 HYDROXY (VIT D DEFICIENCY, FRACTURES): Vit D, 25-Hydroxy: 42.8 ng/mL (ref 30.0–100.0)

## 2024-06-13 NOTE — Progress Notes (Signed)
 Heidi Hernandez                                          MRN: [id number]   06/13/2024   The [facility] Quality Team Specialist reviewed this patient medical record for the purposes of chart review for care gap closure. The following were reviewed: abstraction for care gap closure-controlling blood pressure.    [facility] Quality Team

## 2024-07-23 ENCOUNTER — Observation Stay
Admission: EM | Admit: 2024-07-23 | Discharge: 2024-07-25 | Disposition: A | Discharge status: Home / Self Care | Attending: Student | Admitting: Student

## 2024-07-23 ENCOUNTER — Other Ambulatory Visit: Payer: Self-pay

## 2024-07-23 ENCOUNTER — Encounter: Payer: Self-pay | Admitting: Oncology

## 2024-07-23 ENCOUNTER — Emergency Department

## 2024-07-23 DIAGNOSIS — G2581 Restless legs syndrome: Secondary | ICD-10-CM | POA: Diagnosis not present

## 2024-07-23 DIAGNOSIS — I700 Atherosclerosis of aorta: Secondary | ICD-10-CM | POA: Insufficient documentation

## 2024-07-23 DIAGNOSIS — Z79899 Other long term (current) drug therapy: Secondary | ICD-10-CM | POA: Insufficient documentation

## 2024-07-23 DIAGNOSIS — Z87891 Personal history of nicotine dependence: Secondary | ICD-10-CM | POA: Diagnosis not present

## 2024-07-23 DIAGNOSIS — R079 Chest pain, unspecified: Secondary | ICD-10-CM | POA: Diagnosis present

## 2024-07-23 DIAGNOSIS — G4700 Insomnia, unspecified: Secondary | ICD-10-CM | POA: Diagnosis not present

## 2024-07-23 DIAGNOSIS — R0789 Other chest pain: Secondary | ICD-10-CM | POA: Diagnosis not present

## 2024-07-23 DIAGNOSIS — K219 Gastro-esophageal reflux disease without esophagitis: Secondary | ICD-10-CM | POA: Diagnosis not present

## 2024-07-23 DIAGNOSIS — I77810 Thoracic aortic ectasia: Secondary | ICD-10-CM | POA: Diagnosis not present

## 2024-07-23 DIAGNOSIS — R7989 Other specified abnormal findings of blood chemistry: Secondary | ICD-10-CM | POA: Diagnosis not present

## 2024-07-23 DIAGNOSIS — I10 Essential (primary) hypertension: Secondary | ICD-10-CM | POA: Diagnosis not present

## 2024-07-23 LAB — CBC
HCT: 40.5 % (ref 36.0–46.0)
Hemoglobin: 14 g/dL (ref 12.0–15.0)
MCH: 32.6 pg (ref 26.0–34.0)
MCHC: 34.6 g/dL (ref 30.0–36.0)
MCV: 94.4 fL (ref 80.0–100.0)
Platelets: 147 10*3/uL — ABNORMAL LOW (ref 150–400)
RBC: 4.29 MIL/uL (ref 3.87–5.11)
RDW: 14.6 % (ref 11.5–15.5)
WBC: 12.4 10*3/uL — ABNORMAL HIGH (ref 4.0–10.5)
nRBC: 0 % (ref 0.0–0.2)

## 2024-07-23 LAB — BASIC METABOLIC PANEL WITH GFR
Anion gap: 11 (ref 5–15)
BUN: 17 mg/dL (ref 8–23)
CO2: 24 mmol/L (ref 22–32)
Calcium: 9.7 mg/dL (ref 8.9–10.3)
Chloride: 109 mmol/L (ref 98–111)
Creatinine, Ser: 0.7 mg/dL (ref 0.44–1.00)
GFR, Estimated: >60 mL/min
Glucose, Bld: 157 mg/dL — ABNORMAL HIGH (ref 70–99)
Potassium: 4.4 mmol/L (ref 3.5–5.1)
Sodium: 144 mmol/L (ref 135–145)

## 2024-07-23 LAB — TYPE AND SCREEN
ABO/RH(D): O POS
Antibody Screen: NEGATIVE

## 2024-07-23 LAB — HEPATIC FUNCTION PANEL
ALT: 11 U/L (ref 0–44)
AST: 20 U/L (ref 15–41)
Albumin: 4.2 g/dL (ref 3.5–5.0)
Alkaline Phosphatase: 79 U/L (ref 38–126)
Bilirubin, Direct: 0.1 mg/dL (ref 0.0–0.2)
Indirect Bilirubin: 0.1 mg/dL — ABNORMAL LOW (ref 0.3–0.9)
Total Bilirubin: 0.2 mg/dL (ref 0.0–1.2)
Total Protein: 6.6 g/dL (ref 6.5–8.1)

## 2024-07-23 LAB — PROTIME-INR
INR: 0.9 (ref 0.8–1.2)
Prothrombin Time: 13.2 s (ref 11.4–15.2)

## 2024-07-23 LAB — TROPONIN T, HIGH SENSITIVITY
Troponin T High Sensitivity: 18 ng/L (ref 0–19)
Troponin T High Sensitivity: 24 ng/L — ABNORMAL HIGH (ref 0–19)

## 2024-07-23 LAB — LIPASE, BLOOD: Lipase: 26 U/L (ref 11–51)

## 2024-07-23 MED ORDER — SUCRALFATE 1 G PO TABS
1.0000 g | ORAL_TABLET | Freq: Once | ORAL | Status: AC
  Administered 2024-07-23: 1 g via ORAL
  Filled 2024-07-23: qty 1

## 2024-07-23 MED ORDER — PRAMIPEXOLE DIHYDROCHLORIDE 1 MG PO TABS
1.0000 mg | ORAL_TABLET | Freq: Every evening | ORAL | Status: DC
  Administered 2024-07-23 – 2024-07-24 (×2): 1 mg via ORAL
  Filled 2024-07-23 (×3): qty 1

## 2024-07-23 MED ORDER — IOHEXOL 350 MG/ML SOLN
75.0000 mL | Freq: Once | INTRAVENOUS | Status: AC | PRN
  Administered 2024-07-23: 75 mL via INTRAVENOUS

## 2024-07-23 MED ORDER — KETOROLAC TROMETHAMINE 15 MG/ML IJ SOLN
15.0000 mg | Freq: Four times a day (QID) | INTRAMUSCULAR | Status: AC
  Administered 2024-07-23 – 2024-07-24 (×4): 15 mg via INTRAVENOUS
  Filled 2024-07-23 (×4): qty 1

## 2024-07-23 MED ORDER — SUCRALFATE 1 GM/10ML PO SUSP
1.0000 g | Freq: Three times a day (TID) | ORAL | Status: DC
  Administered 2024-07-23 – 2024-07-25 (×6): 1 g via ORAL
  Filled 2024-07-23 (×8): qty 10

## 2024-07-23 MED ORDER — DIPHENHYDRAMINE HCL 50 MG/ML IJ SOLN
6.2500 mg | Freq: Once | INTRAMUSCULAR | Status: AC
  Administered 2024-07-23: 6.5 mg via INTRAVENOUS
  Filled 2024-07-23: qty 1

## 2024-07-23 MED ORDER — ALUM & MAG HYDROXIDE-SIMETH 200-200-20 MG/5ML PO SUSP
30.0000 mL | Freq: Once | ORAL | Status: AC
  Administered 2024-07-23: 30 mL via ORAL
  Filled 2024-07-23: qty 30

## 2024-07-23 MED ORDER — HYDROMORPHONE HCL 1 MG/ML IJ SOLN
1.0000 mg | Freq: Once | INTRAMUSCULAR | Status: AC
  Administered 2024-07-23: 1 mg via INTRAVENOUS
  Filled 2024-07-23: qty 1

## 2024-07-23 MED ORDER — TRAZODONE HCL 50 MG PO TABS: 50.0000 mg | ORAL_TABLET | Freq: Every evening | ORAL | Status: DC | PRN

## 2024-07-23 MED ORDER — TIZANIDINE HCL 2 MG PO TABS
4.0000 mg | ORAL_TABLET | Freq: Every evening | ORAL | Status: DC | PRN
  Administered 2024-07-24 (×2): 4 mg via ORAL
  Filled 2024-07-23 (×2): qty 2

## 2024-07-23 MED ORDER — COLCHICINE 0.6 MG PO TABS
1.2000 mg | ORAL_TABLET | Freq: Once | ORAL | Status: AC
  Administered 2024-07-23: 1.2 mg via ORAL
  Filled 2024-07-23: qty 2

## 2024-07-23 MED ORDER — POLYETHYLENE GLYCOL 3350 17 G PO PACK: 17.0000 g | PACK | Freq: Every day | ORAL | Status: DC | PRN

## 2024-07-23 MED ORDER — ONDANSETRON HCL 4 MG/2ML IJ SOLN: 4.0000 mg | Freq: Four times a day (QID) | INTRAMUSCULAR | Status: DC | PRN

## 2024-07-23 MED ORDER — MORPHINE SULFATE (PF) 4 MG/ML IV SOLN
4.0000 mg | Freq: Once | INTRAVENOUS | Status: AC
  Administered 2024-07-23: 4 mg via INTRAVENOUS
  Filled 2024-07-23: qty 1

## 2024-07-23 MED ORDER — HYDRALAZINE HCL 20 MG/ML IJ SOLN: 10.0000 mg | INTRAMUSCULAR | Status: DC | PRN

## 2024-07-23 MED ORDER — PANTOPRAZOLE SODIUM 40 MG PO TBEC
40.0000 mg | DELAYED_RELEASE_TABLET | Freq: Every day | ORAL | Status: DC
  Administered 2024-07-23 – 2024-07-24 (×2): 40 mg via ORAL
  Filled 2024-07-23 (×2): qty 1

## 2024-07-23 MED ORDER — AMLODIPINE BESYLATE 5 MG PO TABS
10.0000 mg | ORAL_TABLET | Freq: Every day | ORAL | Status: DC
  Administered 2024-07-23 – 2024-07-25 (×3): 10 mg via ORAL
  Filled 2024-07-23 (×3): qty 2

## 2024-07-23 MED ORDER — BISACODYL 5 MG PO TBEC: 5.0000 mg | DELAYED_RELEASE_TABLET | Freq: Every day | ORAL | Status: DC | PRN

## 2024-07-23 MED ORDER — FAMOTIDINE IN NACL 20-0.9 MG/50ML-% IV SOLN
20.0000 mg | Freq: Once | INTRAVENOUS | Status: AC
  Administered 2024-07-23: 20 mg via INTRAVENOUS
  Filled 2024-07-23: qty 50

## 2024-07-23 MED ORDER — COLCHICINE 0.6 MG PO TABS
0.6000 mg | ORAL_TABLET | Freq: Every day | ORAL | Status: DC
  Administered 2024-07-24 – 2024-07-25 (×2): 0.6 mg via ORAL
  Filled 2024-07-23 (×2): qty 1

## 2024-07-23 MED ORDER — ENOXAPARIN SODIUM 40 MG/0.4ML IJ SOSY
40.0000 mg | PREFILLED_SYRINGE | INTRAMUSCULAR | Status: DC
  Administered 2024-07-23 – 2024-07-24 (×2): 40 mg via SUBCUTANEOUS
  Filled 2024-07-23 (×2): qty 0.4

## 2024-07-23 MED ORDER — OXYCODONE HCL 5 MG PO TABS
5.0000 mg | ORAL_TABLET | ORAL | Status: DC | PRN
  Administered 2024-07-23 – 2024-07-25 (×3): 5 mg via ORAL
  Filled 2024-07-23 (×3): qty 1

## 2024-07-23 MED ORDER — ONDANSETRON HCL 4 MG PO TABS: 4.0000 mg | ORAL_TABLET | Freq: Four times a day (QID) | ORAL | Status: DC | PRN

## 2024-07-23 MED ORDER — METHADONE HCL 10 MG PO TABS
5.0000 mg | ORAL_TABLET | Freq: Every day | ORAL | Status: DC
  Administered 2024-07-23 – 2024-07-25 (×3): 5 mg via ORAL
  Filled 2024-07-23 (×3): qty 1

## 2024-07-23 MED ORDER — ATENOLOL 25 MG PO TABS
25.0000 mg | ORAL_TABLET | Freq: Every day | ORAL | Status: DC
  Administered 2024-07-23 – 2024-07-25 (×3): 25 mg via ORAL
  Filled 2024-07-23 (×3): qty 1

## 2024-07-23 MED ORDER — DIPHENHYDRAMINE HCL 25 MG PO CAPS
25.0000 mg | ORAL_CAPSULE | Freq: Three times a day (TID) | ORAL | Status: DC | PRN
  Administered 2024-07-23: 25 mg via ORAL
  Filled 2024-07-23: qty 1

## 2024-07-23 NOTE — ED Notes (Signed)
 Pt reporting 9 out of 10 chest pain. Clarine, MD aware

## 2024-07-23 NOTE — Hospital Course (Signed)
 74 y.o. female with history of aortic ectasia, HTN, GERD, ITP, MV prolapse, B12 deficiency, restless leg syndrome, presenting with acutely worsening left-sided substernal chest pain.      Assessment and Plan:   Atypical chest pain - Initial workup to rule out aortic aneurysm, dissection, pneumonia, pulmonary embolism.  CT angio negative for acute findings.  ECG noting no obvious ST elevation or depression.  Other differentials include GERD, esophageal spasm, pericarditis, pleuritic pain, costochondritis.  The latter being more likely.  However will monitor on telemetry, continue to trend troponins.  Initiated on anti-inflammatories, scheduled Toradol  15 mg every 6 hours as well as colchicine  1.2 mg.  Opioid pain medications on board.  Protonix  added.  Will add sucralfate  for symptomatic therapy.   Mild troponin elevation - Troponins going from 18 up to 24.  Still very low and mostly flat.  Will recheck/trend troponin.  If troponins continue to elevate, can consider cardiac etiology such as pericarditis.  Possibly consult cardiology.   GERD - Restart Protonix  40 mg daily as well as sucralfate .   HTN - Resume patient's home amlodipine  10 mg daily as well as atenolol  25 mg daily.  Hydralazine  as needed.   Restless leg syndrome - Resume patient's methadone  plus pramipexole .   Goals of care - Admit to observation.

## 2024-07-23 NOTE — ED Triage Notes (Signed)
 Pt sts that she has been having chest pain for the last day. Pt sts that it comes and goes. Pt sts that she started to have pain behind her left breast last night and it went away then today it came back stronger.

## 2024-07-23 NOTE — ED Provider Notes (Signed)
 "  [facility] Provider Note    Event Date/Time   First MD Initiated Contact with Patient 07/23/24 1307     (approximate)   History   Chest Pain   HPI  Heidi Hernandez is a 74 y.o. female  past medical history significant for thoracic aortic ectasia, diaphragmatic hernia, hypertension, mitral valve prolapse, insomnia, vitamin D  deficiency who presents to the emergency department with 12 hours of intermittent chest pain.  Patient reports pain comes and goes and initiated last night behind her left breast.  It reinitiated approximately 2 hours prior to presentation.  Denies any coca minute shortness of breath.  Denies any abdominal pain.  Denies any sensation changes to any of her extremities.  Reports compliance with all medications.  She presents with her husband and they are acutely concerned about the possibility of her aortic aneurysm growing.  She reports that she is followed by Dr. Placido of cardiology for this.       Physical Exam   Triage Vital Signs: ED Triage Vitals [07/23/24 1304]  Encounter Vitals Group     BP (!) 145/69     Girls Systolic BP Percentile      Girls Diastolic BP Percentile      Boys Systolic BP Percentile      Boys Diastolic BP Percentile      Pulse Rate 79     Resp 17     Temp 98 F (36.7 C)     Temp Source Oral     SpO2 96 %     Weight 120 lb (54.4 kg)     Height 5' 3 (1.6 m)     Head Circumference      Peak Flow      Pain Score 10     Pain Loc      Pain Education      Exclude from Growth Chart     Most recent vital signs: Vitals:   07/23/24 1304  BP: (!) 145/69  Pulse: 79  Resp: 17  Temp: 98 F (36.7 C)  SpO2: 96%    Nursing Triage Note reviewed. Vital signs reviewed and patients oxygen saturation is normoxic  General: Patient is well nourished, well developed, awake and alert, appears uncomfortable, crying Head: Normocephalic and atraumatic Eyes: Normal inspection, extraocular muscles intact, no  conjunctival pallor Ear, nose, throat: Normal external exam Neck: Normal range of motion Respiratory: Patient is in no respiratory distress, lungs CTAB Cardiovascular: Patient is not tachycardic, RRR without murmur appreciated GI: Abd SNT with no guarding or rebound  Back: Normal inspection of the back with good strength and range of motion throughout all ext Extremities: pulses intact with good cap refills, no LE pitting edema or calf tenderness Neuro: The patient is alert and oriented to person, place, and time, appropriately conversive, with 5/5 bilat UE/LE strength, no gross motor or sensory defects noted. Coordination appears to be adequate. Skin: Warm, dry, and intact Psych: normal mood and affect, no SI or HI  ED Results / Procedures / Treatments   Labs (all labs ordered are listed, but only abnormal results are displayed) Labs Reviewed  BASIC METABOLIC PANEL WITH GFR - Abnormal; Notable for the following components:      Result Value   Glucose, Bld 157 (*)    All other components within normal limits  CBC - Abnormal; Notable for the following components:   WBC 12.4 (*)    Platelets 147 (*)    All other components  within normal limits  PROTIME-INR  TYPE AND SCREEN  TROPONIN T, HIGH SENSITIVITY  TROPONIN T, HIGH SENSITIVITY     EKG EKG and rhythm strip are interpreted by myself:   EKG: [Normal sinus rhythm] at heart rate of  82, normal QRS duration, QTc 448, nonspecific ST segments and T waves no ectopy EKG not consistent with Acute STEMI Rhythm strip: NSR in lead II   RADIOLOGY CXR: No acute abnormality on my independent review interpretation radiologist agrees CT angio chest aorta: No acute abnormality on my independent review interpretation radiologist agrees    PROCEDURES:  Critical Care performed: No  Procedures   MEDICATIONS ORDERED IN ED: Medications  famotidine  (PEPCID ) IVPB 20 mg premix (0 mg Intravenous Stopped 07/23/24 1416)  morphine  (PF) 4  MG/ML injection 4 mg (4 mg Intravenous Given 07/23/24 1342)  diphenhydrAMINE  (BENADRYL ) injection 6.5 mg (6.5 mg Intravenous Given 07/23/24 1342)  iohexol  (OMNIPAQUE ) 350 MG/ML injection 75 mL (75 mLs Intravenous Contrast Given 07/23/24 1423)  sucralfate  (CARAFATE ) tablet 1 g (1 g Oral Given 07/23/24 1510)  alum & mag hydroxide-simeth (MAALOX/MYLANTA) 200-200-20 MG/5ML suspension 30 mL (30 mLs Oral Given 07/23/24 1511)     IMPRESSION / MDM / ASSESSMENT AND PLAN / ED COURSE                                Differential diagnosis includes, but is not limited to: ACS, aortic aneurysm, pneumonia, pleuritis, GERD, anemia  ED course: Patient presents acutely, crying and holding her chest.  She is acutely worried about the possibility of fall thoracic aortic ectasia/developing an aneurysm.  She does have a slight leukocytosis of 12.4 but no profound anemia.  No electrolyte derangements and troponin is not elevated.  Chest x-ray was unremarkable along with CT angio chest and aorta.  On reassessment patient is still endorsing a lot of pain and given her history of diaphragmatic hernia I am concerned about the possibility of worsening reflux.  Of note she is nontender at all in her abdomen.  Consequently I have admitted was started GI cocktail and sucralfate .  Patient signed out to oncoming physician pending repeat troponin and reassessment at 3 PM   Clinical Course as of 07/23/24 1528  Tue Jul 23, 2024  1330 Patient's blood pressure in the room is 129/86 [HD]  1412 Troponin T High Sensitivity: 18 Not elevated [HD]  1457 CT Angio Chest Aorta W and/or Wo Contrast Stable in size [HD]  1459 Patient updated on workup thus far and reassured but still endorses pain we will initiate therapeutics directed at the esophagus at this time however we will wait for the second troponin [HD]  1510 S/o: 87F hx aortic ectasia, htn, diaphragmatic hernia - intermittent chest pain  - trop wnl, ecg wnl, CTA wnl - still  complaining of chest pain - getting GI cocktail now  TO DO: - f/u rpt trop - re-eval   [MM]    Clinical Course User Index [HD] Nicholaus Rolland BRAVO, MD [MM] Clarine Ozell LABOR, MD   -- Risk: 5 This patient has a high risk of morbidity due to further diagnostic testing or treatment. Rationale: This patients evaluation and management involve a high risk of morbidity due to the potential severity of presenting symptoms, need for diagnostic testing, and/or initiation of treatment that may require close monitoring. The differential includes conditions with potential for significant deterioration or requiring escalation of care. Treatment decisions in the ED,  including medication administration, procedural interventions, or disposition planning, reflect this level of risk. COPA: 5 The patient has the following acute or chronic illness/injury that poses a possible threat to life or bodily function: [X] : The patient has a potentially serious acute condition or an acute exacerbation of a chronic illness requiring urgent evaluation and management in the Emergency Department. The clinical presentation necessitates immediate consideration of life-threatening or function-threatening diagnoses, even if they are ultimately ruled out.   FINAL CLINICAL IMPRESSION(S) / ED DIAGNOSES   Final diagnoses:  Chest pain, unspecified type     Rx / DC Orders   ED Discharge Orders     None        Note:  This document was prepared using Dragon voice recognition software and may include unintentional dictation errors.   Nicholaus Rolland BRAVO, MD 07/23/24 1528  "

## 2024-07-23 NOTE — ED Provider Notes (Signed)
" °  Physical Exam  BP (!) 145/69 (BP Location: Left Arm)   Pulse 79   Temp 98 F (36.7 C) (Oral)   Resp 17   Ht 5' 3 (1.6 m)   Wt 54.4 kg   SpO2 96%   BMI 21.26 kg/m   Physical Exam  Procedures  Procedures  ED Course / MDM   Clinical Course as of 07/23/24 1642  Tue Jul 23, 2024  1330 Patient's blood pressure in the room is 129/86 [HD]  1412 Troponin T High Sensitivity: 18 Not elevated [HD]  1457 CT Angio Chest Aorta W and/or Wo Contrast Stable in size [HD]  1459 Patient updated on workup thus far and reassured but still endorses pain we will initiate therapeutics directed at the esophagus at this time however we will wait for the second troponin [HD]  1510 S/o: 59F hx aortic ectasia, htn, diaphragmatic hernia - intermittent chest pain  - trop wnl, ecg wnl, CTA wnl - still complaining of chest pain - getting GI cocktail now  TO DO: - f/u rpt trop - re-eval   [MM]  1550 Trop very mildly rising  Getting rpt ecg [MM]  1559 Patient reevaluated.  Does note persistent substernal and epigastric chest pain which she describes as severe.  Reassured by overall unremarkable workup so far though does note she has a history of pancreatitis and wonders if this could be contributing.  Per chart review, looks like she was previously treated for gallstone pancreatitis and had a cholecystectomy at [facility].  Will add lipase, LFTs and also discussed with reading radiologist from earlier CT whether there are any findings suggestive of pancreatitis / choledocholithiasis, no comment on this on CT scan read. [MM]  1604 D/w Dr. Philip of radiology who read original study, no clear findings of pancreatitis and biliary duct does appear patient, no clear evidence of choledocholithiasis [MM]  1627 Hepatobiliary labs reviewed, unremarkable.  Lipase normal.  No other clear etiology of presentation today.  Will discuss with hospitalist.  Hospitalist consult order placed. [MM]  1639 Presented to hospitalist  for admission Deferring heparin at this time, they will evaluate [MM]    Clinical Course User Index [HD] Nicholaus Rolland BRAVO, MD [MM] Clarine Ozell LABOR, MD   Medical Decision Making Amount and/or Complexity of Data Reviewed Labs: ordered. Decision-making details documented in ED Course. Radiology: ordered. Decision-making details documented in ED Course.  Risk OTC drugs. Prescription drug management.          Clarine Ozell LABOR, MD 07/23/24 1642  "

## 2024-07-23 NOTE — H&P (Signed)
 " History and Physical    Patient: Heidi Hernandez FMW:[id number] DOB: 01-30-1951 DOA: 07/23/2024 DOS: the patient was seen and examined on 07/23/2024 PCP: Gasper Nancyann BRAVO, MD  Patient coming from: Home  Chief Complaint:  Chief Complaint  Patient presents with   Chest Pain    HPI: Heidi Hernandez is a 74 y.o. female with history of aortic ectasia, HTN, GERD, ITP, MV prolapse, B12 deficiency, restless leg syndrome, presenting with acutely worsening left-sided substernal chest pain.  Patient states that her pain began this morning when she woke up however is acutely worsening in the last couple of hours.  Describes her pain as left-sided, substernal, sharp like someone is stabbing her with intermittent worsening like turning the knife.  Admits to worsening with big breaths.  Stated her pain feels similar to when she had pancreatitis in the past.  Concerned about her aortic aneurysm.  Denies any fever, shortness of breath, nausea, vomiting, abdominal pain.  Does admit to some loose stools.  No recent illness.  No change in medication regimen.  Patient admits to similar presentations to the ED for chest pain but this is in a different area, previously more epigastric/central sternal, now left-sided.  Denies any radiation into her pain, sweating, diaphoresis.   Review of Systems: As mentioned in the history of present illness. All other systems reviewed and are negative. Past Medical History:  Diagnosis Date   Allergy trees, dogs, cats, mold   Take allergy shots   Anxiety    Aorta disorder    pt unsure but gets followed at [facility] cardiology for this   Arthritis    Asthma    GERD (gastroesophageal reflux disease)    Headache    Migraines   Heart murmur    Hypertension    Idiopathic thrombocytopenia purpura (HCC)    Required splenectomy   MVP (mitral valve prolapse)    Neuromuscular disorder (HCC)    Restless Leg Syndrome   Osteoporosis    Pancreatitis April 2017 [facility] due to  cholecystitis.    Pneumonia    06-2015   Past Surgical History:  Procedure Laterality Date   ABDOMINAL HYSTERECTOMY     APPENDECTOMY     CARPAL TUNNEL RELEASE Bilateral 11/19/2014   Procedure: CARPAL TUNNEL RELEASE;  Surgeon: Lynwood SHAUNNA Hue, MD;  Location: [facility] ORS;  Service: Orthopedics;  Laterality: Bilateral;   CESAREAN SECTION     times 2   CHOLECYSTECTOMY  09/29/2015   [facility]   COLONOSCOPY N/A 05/02/2016   Procedure: COLONOSCOPY;  Surgeon: Lamar ONEIDA Holmes, MD;  Location: [facility] ENDOSCOPY;  Service: Endoscopy;  Laterality: N/A;   ESOPHAGOGASTRODUODENOSCOPY (EGD) WITH PROPOFOL  N/A 05/02/2016   Procedure: ESOPHAGOGASTRODUODENOSCOPY (EGD) WITH PROPOFOL ;  Surgeon: Lamar ONEIDA Holmes, MD;  Location: [facility] ENDOSCOPY;  Service: Endoscopy;  Laterality: N/A;   ESOPHAGOGASTRODUODENOSCOPY (EGD) WITH PROPOFOL  N/A 11/22/2017   Procedure: ESOPHAGOGASTRODUODENOSCOPY (EGD) WITH PROPOFOL ;  Surgeon: Holmes Lamar ONEIDA, MD;  Location: [facility] ENDOSCOPY;  Service: Endoscopy;  Laterality: N/A;   EYE SURGERY  cataract repair   don't know date   HERNIA REPAIR     times 3   INSERTION OF MESH  07/05/2022   Procedure: INSERTION OF MESH;  Surgeon: Jordis Laneta FALCON, MD;  Location: [facility] ORS;  Service: General;;   JOINT REPLACEMENT     OVARIAN CYST SURGERY     SHOULDER ARTHROSCOPY WITH ROTATOR CUFF REPAIR     times 2   SPLENECTOMY, TOTAL     TOTAL SHOULDER ARTHROPLASTY  Right 04/21/2017   Procedure: REVERSE SHOULDER ARTHROPLASTY;  Surgeon: Tobie Priest, MD;  Location: [facility] ORS;  Service: Orthopedics;  Laterality: Right;   TRIGGER FINGER RELEASE Right 11/19/2014   Procedure: RELEASE TRIGGER FINGER/A-1 PULLEY;  Surgeon: Lynwood SHAUNNA Hue, MD;  Location: [facility] ORS;  Service: Orthopedics;  Laterality: Right;   TRIGGER FINGER RELEASE Right 08/12/2015   Procedure: RELEASE TRIGGER LONG FINGER;  Surgeon: Lynwood SHAUNNA Hue, MD;  Location: [facility] ORS;  Service: Orthopedics;  Laterality: Right;   TUBAL LIGATION  complete hysterectomy now    VENTRAL HERNIA REPAIR N/A 07/20/2023   Procedure: HERNIA REPAIR VENTRAL ADULT, open;  Surgeon: Jordis Laneta FALCON, MD;  Location: [facility] ORS;  Service: General;  Laterality: N/A;   XI ROBOTIC ASSISTED PARAESOPHAGEAL HERNIA REPAIR  07/05/2022   Procedure: XI ROBOTIC ASSISTED PARAESOPHAGEAL HERNIA REPAIR;  Surgeon: Jordis Laneta FALCON, MD;  Location: [facility] ORS;  Service: General;;   Social History:  reports that she quit smoking about 44 years ago. Her smoking use included cigarettes. She started smoking about 54 years ago. She has a 10 pack-year smoking history. She has been exposed to tobacco smoke. She has never used smokeless tobacco. She reports that she does not currently use alcohol. She reports that she does not use drugs.  Allergies[1]  Family History  Problem Relation Age of Onset   Hypertension Mother    Renal Disease Father        ESRD with HD   Breast cancer Neg Hx     Prior to Admission medications  Medication Sig Start Date End Date Taking? Authorizing Provider  albuterol  (VENTOLIN  HFA) 108 (90 Base) MCG/ACT inhaler Inhale 2 puffs into the lungs every 6 (six) hours as needed for wheezing. May substitute generic or equivalent brand name 08/30/23  Yes Clifton, Curtis A, FNP  alendronate  (FOSAMAX ) 70 MG tablet TAKE 1 TABLET BY MOUTH ONCE WEEKLY ON A EMPTY STOMACH BEFORE BREAKFAST. REMAIN UPRIGHT FOR 30 MINUTES AND TAKE WITH 8 OUNCES OF WATER 05/01/24  Yes Gasper Nancyann BRAVO, MD  amLODipine  (NORVASC ) 10 MG tablet TAKE 1 TABLET BY MOUTH DAILY 10/07/23  Yes Gasper Nancyann BRAVO, MD  atenolol  (TENORMIN ) 25 MG tablet TAKE 1 TABLET BY MOUTH DAILY 12/06/23  Yes Gasper Nancyann BRAVO, MD  cetirizine  (ZYRTEC ) 10 MG tablet Take 1 tablet (10 mg total) by mouth daily. 09/01/23  Yes Ostwalt, Janna, PA-C  methadone  (DOLOPHINE ) 5 MG tablet Take 5 mg by mouth daily. 03/24/21  Yes Alfredo Caldron, MD  omeprazole (PRILOSEC) 20 MG capsule Take 20 mg by mouth daily.   Yes [provider]  pramipexole  (MIRAPEX ) 0.5 MG  tablet Take 1 tablet by mouth every evening. 4-5 pm Patient taking differently: Take 2 tablets by mouth every evening. 4-5 pm 02/22/20  Yes [provider]  SUMAtriptan  (IMITREX ) 100 MG tablet TAKE 1 TABLET BY MOUTH AT ONSET OF HEADACHE; MAY REPEAT 1 TABLET IN 2 HOURS IF NEEDED. 09/21/23  Yes Gasper Nancyann BRAVO, MD  tiZANidine  (ZANAFLEX ) 4 MG tablet Take 4 mg by mouth at bedtime as needed for muscle spasms. 07/08/24  Yes [provider]  traMADol  (ULTRAM ) 50 MG tablet Take 50 mg by mouth every 6 (six) hours as needed for moderate pain (pain score 4-6). 07/08/24  Yes [provider]  cyanocobalamin  (VITAMIN B12) 1000 MCG tablet Take 1,000 mcg by mouth daily. Patient not taking: Reported on 07/23/2024    [provider]    Physical Exam:  Vitals:   07/23/24 1304 07/23/24 1644  BP: (!) 145/69 (!) 140/70  Pulse: 79 73  Resp: 17 (!) 28  Temp: 98 F (36.7 C) 98.2 F (36.8 C)  TempSrc: Oral Oral  SpO2: 96% 99%  Weight: 54.4 kg   Height: 5' 3 (1.6 m)     GENERAL:  Alert, pleasant, no acute distress, thin HEENT:  EOMI CARDIOVASCULAR:  RRR, worsening of pain with strong palpation of her chest wall, worsening of pain with large breaths RESPIRATORY:  Clear to auscultation, no wheezing, rales, or rhonchi GASTROINTESTINAL:  Soft, nontender, nondistended EXTREMITIES:  No LE edema bilaterally NEURO:  No new focal deficits appreciated SKIN:  No rashes noted PSYCH:  Appropriate mood and affect, anxious   Data Reviewed:  There are no new results to review at this time.  CT Angio Chest Aorta W and/or Wo Contrast Result Date: 07/23/2024 CLINICAL DATA:  Aortic aneurysm, chest pain. EXAM: CT ANGIOGRAPHY CHEST WITH CONTRAST TECHNIQUE: Multidetector CT imaging of the chest was performed using the standard protocol during bolus administration of intravenous contrast. Multiplanar CT image reconstructions and MIPs were obtained to evaluate the vascular anatomy. RADIATION  DOSE REDUCTION: This exam was performed according to the departmental dose-optimization program which includes automated exposure control, adjustment of the mA and/or kV according to patient size and/or use of iterative reconstruction technique. CONTRAST:  75mL OMNIPAQUE  IOHEXOL  350 MG/ML SOLN COMPARISON:  Chest CTA 12/04/2023 and 02/06/2023 and 05/17/2022 FINDINGS: Cardiovascular: Aortic root measures 3.5 cm. Ascending thoracic aorta measures up to 3.6 cm and stable. No evidence for an aortic aneurysm. No evidence for an aortic dissection. Arch great vessels are patent. Left vertebral artery originates directly from the arch and this is a normal variant. Proximal descending thoracic aorta measures 2.8 cm. Atherosclerotic disease involving the descending thoracic aorta. Atherosclerotic disease in the abdominal aorta with normal caliber. Celiac trunk, SMA and bilateral renal arteries are patent. Mediastinum/Nodes: Esophagus is unremarkable. No mediastinal, hilar or axillary lymph node enlargement. Lungs/Pleura: Stable scarring at the lung apices. 8 mm nodular density on image 14, sequence 6 is not significantly changed since 05/17/2022. There is also a stable punctate nodule at the right lung apex on image 18. Stable punctate nodule along the left major fissure in the left lower lobe superior segment on image 35. No airspace disease or consolidation in lungs. No pleural effusions. Stable punctate nodule in the left upper lobe on image 85, sequence 6. Upper Abdomen: Chronic pneumobilia.  Splenectomy. Musculoskeletal: Right shoulder arthroplasty. Chronic vertebral body compression fracture at T5. No acute bone abnormality. Review of the MIP images confirms the above findings. IMPRESSION: 1. No acute chest abnormality. 2. Ascending thoracic aorta is stable in size. No evidence for an aortic aneurysm. 3.  Aortic Atherosclerosis (ICD10-I70.0). 4. Stable scarring at the lung apices. Stable small pulmonary nodules as  described. Electronically Signed   By: Juliene Balder M.D.   On: 07/23/2024 14:55   DG Chest 2 View Result Date: 07/23/2024 CLINICAL DATA:  Chest pain. EXAM: CHEST - 2 VIEW COMPARISON:  12/04/2023 FINDINGS: Cardiomediastinal silhouette and pulmonary vasculature are within normal limits. Lungs are clear.  RIGHT shoulder prosthesis partially visualized. T5, T12, and L1 compression fractures are unchanged since prior CT from 12/04/2023. IMPRESSION: No acute cardiopulmonary process. Electronically Signed   By: Aliene Lloyd M.D.   On: 07/23/2024 14:20    Assessment and Plan:  Atypical chest pain - Initial workup to rule out aortic aneurysm, dissection, pneumonia, pulmonary embolism.  CT angio negative for acute findings.  ECG noting no  obvious ST elevation or depression.  Other differentials include GERD, esophageal spasm, pericarditis, pleuritic pain, costochondritis.  The latter being more likely.  However will monitor on telemetry, continue to trend troponins.  Initiated on anti-inflammatories, scheduled Toradol  15 mg every 6 hours as well as colchicine  1.2 mg.  Opioid pain medications on board.  Protonix  added.  Will add sucralfate  for symptomatic therapy.  Mild troponin elevation - Troponins going from 18 up to 24.  Still very low and mostly flat.  Will recheck/trend troponin.  If troponins continue to elevate, can consider cardiac etiology such as pericarditis.  Possibly consult cardiology.  GERD - Restart Protonix  40 mg daily as well as sucralfate .  HTN - Resume patient's home amlodipine  10 mg daily as well as atenolol  25 mg daily.  Hydralazine  as needed.  Restless leg syndrome - Resume patient's methadone  plus pramipexole .  Goals of care - Admit to observation.   Advance Care Planning:   Code Status: Full Code   Consults: None  Family Communication: At bedside  Severity of Illness: The appropriate patient status for this patient is OBSERVATION. Observation status is judged to be  reasonable and necessary in order to provide the required intensity of service to ensure the patient's safety. The patient's presenting symptoms, physical exam findings, and initial radiographic and laboratory data in the context of their medical condition is felt to place them at decreased risk for further clinical deterioration. Furthermore, it is anticipated that the patient will be medically stable for discharge from the hospital within 2 midnights of admission.   Author: Carliss LELON Canales, DO 07/23/2024 5:18 PM  For on call review [email or web address].     [1]  Allergies Allergen Reactions   Paroxetine Other (See Comments)    Passed out  Other Reaction(s): Unknown  Other Reaction(s): Not available  paroxetine hydrochloride   Alpha-Lipoic Acid Nausea Only   Augmentin  [Amoxicillin -Pot Clavulanate] Nausea And Vomiting   Cat Dander     Other reaction(s): Not available   Dog Epithelium (Canis Lupus Familiaris)     Other reaction(s): Not available   Droperidol Other (See Comments)    Locked jaw Facial muscles locked Locked jaw   Molds & Smuts     Other reaction(s): Not available   Paroxetine Hcl Other (See Comments)    syncope Passed out    Tree Extract     Other reaction(s): Not available   Morphine  Itching   "

## 2024-07-23 NOTE — ED Notes (Signed)
 Pt requesting benadryl  due to itching with IV pain med administration. See MAR for new order.

## 2024-07-24 ENCOUNTER — Observation Stay: Admit: 2024-07-24 | Discharge: 2024-07-24 | Disposition: A | Attending: Student | Admitting: Student

## 2024-07-24 DIAGNOSIS — R0789 Other chest pain: Secondary | ICD-10-CM | POA: Diagnosis not present

## 2024-07-24 LAB — COMPREHENSIVE METABOLIC PANEL WITH GFR
ALT: 7 U/L (ref 0–44)
AST: 21 U/L (ref 15–41)
Albumin: 3.7 g/dL (ref 3.5–5.0)
Alkaline Phosphatase: 60 U/L (ref 38–126)
Anion gap: 9 (ref 5–15)
BUN: 17 mg/dL (ref 8–23)
CO2: 23 mmol/L (ref 22–32)
Calcium: 8.3 mg/dL — ABNORMAL LOW (ref 8.9–10.3)
Chloride: 107 mmol/L (ref 98–111)
Creatinine, Ser: 0.64 mg/dL (ref 0.44–1.00)
GFR, Estimated: >60 mL/min
Glucose, Bld: 85 mg/dL (ref 70–99)
Potassium: 3.7 mmol/L (ref 3.5–5.1)
Sodium: 139 mmol/L (ref 135–145)
Total Bilirubin: 0.4 mg/dL (ref 0.0–1.2)
Total Protein: 5.9 g/dL — ABNORMAL LOW (ref 6.5–8.1)

## 2024-07-24 LAB — CK: Total CK: 68 U/L (ref 38–234)

## 2024-07-24 LAB — ECHOCARDIOGRAM COMPLETE
AR max vel: 2.12 cm2
AV Area VTI: 2.01 cm2
AV Area mean vel: 2.01 cm2
AV Mean grad: 6 mmHg
AV Peak grad: 10.5 mmHg
Ao pk vel: 1.62 m/s
Area-P 1/2: 2.24 cm2
Height: 63 in
P 1/2 time: 548 ms
S' Lateral: 3.2 cm
Weight: 1920 [oz_av]

## 2024-07-24 LAB — CBC
HCT: 37.2 % (ref 36.0–46.0)
Hemoglobin: 12.3 g/dL (ref 12.0–15.0)
MCH: 32.1 pg (ref 26.0–34.0)
MCHC: 33.1 g/dL (ref 30.0–36.0)
MCV: 97.1 fL (ref 80.0–100.0)
Platelets: 136 10*3/uL — ABNORMAL LOW (ref 150–400)
RBC: 3.83 MIL/uL — ABNORMAL LOW (ref 3.87–5.11)
RDW: 14.5 % (ref 11.5–15.5)
WBC: 8.7 10*3/uL (ref 4.0–10.5)
nRBC: 0 % (ref 0.0–0.2)

## 2024-07-24 LAB — SEDIMENTATION RATE: Sed Rate: 5 mm/h (ref 0–22)

## 2024-07-24 LAB — MAGNESIUM: Magnesium: 2 mg/dL (ref 1.7–2.4)

## 2024-07-24 LAB — TROPONIN T, HIGH SENSITIVITY
Troponin T High Sensitivity: 65 ng/L — ABNORMAL HIGH (ref 0–19)
Troponin T High Sensitivity: 52 ng/L — ABNORMAL HIGH (ref 0–19)

## 2024-07-24 LAB — PROCALCITONIN: Procalcitonin: <0.1 ng/mL

## 2024-07-24 LAB — TSH: TSH: 1.68 u[IU]/mL (ref 0.350–4.500)

## 2024-07-24 MED ORDER — METHYLPREDNISOLONE SODIUM SUCC 40 MG IJ SOLR
40.0000 mg | Freq: Every day | INTRAMUSCULAR | Status: AC
  Administered 2024-07-24 – 2024-07-25 (×2): 40 mg via INTRAVENOUS
  Filled 2024-07-24 (×2): qty 1

## 2024-07-24 MED ORDER — PANTOPRAZOLE SODIUM 40 MG PO TBEC
40.0000 mg | DELAYED_RELEASE_TABLET | Freq: Two times a day (BID) | ORAL | Status: DC
  Administered 2024-07-24 – 2024-07-25 (×2): 40 mg via ORAL
  Filled 2024-07-24 (×2): qty 1

## 2024-07-24 MED ORDER — CYANOCOBALAMIN 1000 MCG/ML IJ SOLN
1000.0000 ug | Freq: Every day | INTRAMUSCULAR | Status: AC
  Administered 2024-07-24 – 2024-07-25 (×2): 1000 ug via INTRAMUSCULAR
  Filled 2024-07-24 (×2): qty 1

## 2024-07-24 MED ORDER — TRAZODONE HCL 100 MG PO TABS
100.0000 mg | ORAL_TABLET | Freq: Every day | ORAL | Status: DC
  Administered 2024-07-24: 100 mg via ORAL
  Filled 2024-07-24: qty 1

## 2024-07-24 MED ORDER — HYDROMORPHONE HCL 1 MG/ML IJ SOLN
0.5000 mg | INTRAMUSCULAR | Status: AC | PRN
  Administered 2024-07-24: 0.5 mg via INTRAVENOUS
  Filled 2024-07-24: qty 0.5

## 2024-07-24 MED ORDER — CYANOCOBALAMIN 500 MCG PO TABS: 1000.0000 ug | ORAL_TABLET | Freq: Every day | ORAL | Status: DC

## 2024-07-24 MED ORDER — DICLOFENAC SODIUM 1 % EX GEL
2.0000 g | Freq: Three times a day (TID) | CUTANEOUS | Status: DC
  Administered 2024-07-24 – 2024-07-25 (×3): 2 g via TOPICAL
  Filled 2024-07-24: qty 100

## 2024-07-24 NOTE — Progress Notes (Signed)
 [facility] Progress Note  Patient: Heidi Hernandez    FMW:[id number]  DOA: 07/23/2024     Date of Service: the patient was seen and examined on 07/24/2024  Chief Complaint  Patient presents with   Chest Pain   Brief hospital course:  Heidi Hernandez is a 74 y.o. female with history of aortic ectasia, HTN, GERD, ITP, MV prolapse, B12 deficiency, restless leg syndrome, presenting with acutely worsening left-sided substernal chest pain.  Patient states that her pain began this morning when she woke up however is acutely worsening in the last couple of hours.  Describes her pain as left-sided, substernal, sharp like someone is stabbing her with intermittent worsening like turning the knife.  Admits to worsening with big breaths.  Stated her pain feels similar to when she had pancreatitis in the past.  Concerned about her aortic aneurysm.  Denies any fever, shortness of breath, nausea, vomiting, abdominal pain.  Does admit to some loose stools.  No recent illness.  No change in medication regimen.  Patient admits to similar presentations to the ED for chest pain but this is in a different area, previously more epigastric/central sternal, now left-sided.  Denies any radiation into her pain, sweating, diaphoresis.    Assessment and Plan:  Atypical chest pain - Initial workup to rule out aortic aneurysm, dissection, pneumonia, pulmonary embolism.  CT angio negative for acute findings.  ECG noting no obvious ST elevation or depression.  Other differentials include GERD, esophageal spasm, pericarditis, pleuritic pain, costochondritis.  The latter being more likely.  However will monitor on telemetry, continue to trend troponins.  Initiated on anti-inflammatories, scheduled Toradol  15 mg every 6 hours as well as colchicine  1.2 mg.  Opioid pain medications on board.  Protonix  added.  Will add sucralfate  for symptomatic therapy. 1/28 most likely costochondritis, reproducible chest pain. Started  Solu-Medrol  40 mg IV daily x 2 doses, Voltaren  gel 3 times daily applied to left-sided chest wall.  Continued other pain medications. Increase pantoprazole  40 mg p.o. twice daily   Mild troponin elevation - Troponins going from 18 up to 24.  Still very low and mostly flat.  Troponin 65>52 remained flat F/u TTE     GERD - Increase pantoprazole  40 mg p.o. twice daily Continued    HTN - Resume patient's home amlodipine  10 mg daily as well as atenolol  25 mg daily.  Hydralazine  as needed.   Restless leg syndrome - Resume patient's methadone  plus pramipexole .   Insomnia: Started trazodone  100 mg p.o. nightly  Vitamin B12 level 317, goal >400: Started vitamin B12 1000 mcg IM injection daily during hospital stay, followed by oral supplement.  Follow-up PCP to repeat vitamin B12 level after 3 to 6 months.   Body mass index is 21.26 kg/m.  Interventions:  Diet: Heart healthy diet DVT Prophylaxis: Subcutaneous Lovenox    Advance goals of care discussion: Full code  Family Communication: family was present at bedside, at the time of interview.  The pt provided permission to discuss medical plan with the family. Opportunity was given to ask question and all questions were answered satisfactorily.   Disposition:  Pt is from Home, admitted with chest wall, still has significant left-sided chest pain, which precludes a safe discharge. Discharge to home, when stable, most likely tomorrow.  Subjective: No significant events overnight.  Patient still complaining of significant pain in the left side, feels like a knife and it radiates to the back.  Will continue above management for pain control.  Plan  is to discharge tomorrow a.m. if remains stable.  Physical Exam: General: NAD, lying comfortably Appear in no distress, affect appropriate Eyes: PERRLA ENT: Oral Mucosa Clear, moist  Neck: no JVD,  Cardiovascular: S1 and S2 Present, no Murmur,  Respiratory: good respiratory effort,  Bilateral Air entry equal and Decreased, no Crackles, no wheezes.  Reproducible left-sided chest wall tenderness Abdomen: Bowel Sound present, Soft and no tenderness,  Skin: no rashes Extremities: no Pedal edema, no calf tenderness Neurologic: without any new focal findings Gait not checked due to patient safety concerns  Vitals:   07/24/24 0504 07/24/24 0932 07/24/24 1130 07/24/24 1515  BP: (!) 147/68 (!) 149/104 (!) 142/72 126/73  Pulse: 70 68 (!) 57 (!) 54  Resp: 16 16 19 14   Temp: 98.3 F (36.8 C) 98.3 F (36.8 C) 98 F (36.7 C) 98.2 F (36.8 C)  TempSrc: Oral Oral Oral Oral  SpO2: 94% 95% 98% 95%  Weight:      Height:       No intake or output data in the 24 hours ending 07/24/24 1817 Filed Weights   07/23/24 1304  Weight: 54.4 kg    Data Reviewed: I have personally reviewed and interpreted daily labs, tele strips, imagings as discussed above. I reviewed all nursing notes, pharmacy notes, vitals, pertinent old records I have discussed plan of care as described above with RN and patient/family.  CBC: Recent Labs  Lab 07/23/24 1335 07/24/24 0657  WBC 12.4* 8.7  HGB 14.0 12.3  HCT 40.5 37.2  MCV 94.4 97.1  PLT 147* 136*   Basic Metabolic Panel: Recent Labs  Lab 07/23/24 1335 07/24/24 0657  NA 144 139  K 4.4 3.7  CL 109 107  CO2 24 23  GLUCOSE 157* 85  BUN 17 17  CREATININE 0.70 0.64  CALCIUM  9.7 8.3*  MG  --  2.0    Studies: ECHOCARDIOGRAM COMPLETE Result Date: 07/24/2024    ECHOCARDIOGRAM REPORT   Patient Name:   Heidi Hernandez Date of Exam: 07/24/2024 Medical Rec #:  [id number]        Height:       63.0 in Accession #:    [id number]       Weight:       120.0 lb Date of Birth:  09/02/1950         BSA:          1.556 m Patient Age:    73 years         BP:           140/70 mmHg Patient Gender: F                HR:           56 bpm. Exam Location:  [facility] Procedure: 2D Echo, Cardiac Doppler, Color Doppler and Saline Contrast Bubble            Study (Both  Spectral and Color Flow Doppler were utilized during            procedure). Indications:     Chest Pain  History:         Patient has no prior history of Echocardiogram examinations.                  Mitral Valve Prolapse; Signs/Symptoms:Chest Pain and Murmur.  Sonographer:     MR Referring Phys:  [id number] [facility] [id number] Diagnosing Phys: Keller Alluri IMPRESSIONS  1. Left ventricular ejection fraction, by estimation, is 60 to  65%. The left ventricle has normal function. The left ventricle has no regional wall motion abnormalities. Left ventricular diastolic parameters are consistent with Grade I diastolic dysfunction (impaired relaxation).  2. Right ventricular systolic function is normal. The right ventricular size is normal.  3. The mitral valve is normal in structure. Trivial mitral valve regurgitation.  4. The aortic valve is tricuspid. Aortic valve regurgitation is mild.  5. The inferior vena cava is normal in size with greater than 50% respiratory variability, suggesting right atrial pressure of 3 mmHg.  6. Agitated saline contrast bubble study was negative, with no evidence of any interatrial shunt. FINDINGS  Left Ventricle: Left ventricular ejection fraction, by estimation, is 60 to 65%. The left ventricle has normal function. The left ventricle has no regional wall motion abnormalities. The left ventricular internal cavity size was normal in size. There is  no left ventricular hypertrophy. Left ventricular diastolic parameters are consistent with Grade I diastolic dysfunction (impaired relaxation). Right Ventricle: The right ventricular size is normal. No increase in right ventricular wall thickness. Right ventricular systolic function is normal. Left Atrium: Left atrial size was normal in size. Right Atrium: Right atrial size was normal in size. Pericardium: There is no evidence of pericardial effusion. Mitral Valve: The mitral valve is normal in structure. Trivial mitral valve regurgitation. Tricuspid  Valve: The tricuspid valve is normal in structure. Tricuspid valve regurgitation is trivial. Aortic Valve: The aortic valve is tricuspid. Aortic valve regurgitation is mild. Aortic regurgitation PHT measures 548 msec. Aortic valve mean gradient measures 6.0 mmHg. Aortic valve peak gradient measures 10.5 mmHg. Aortic valve area, by VTI measures 2.01 cm. Pulmonic Valve: The pulmonic valve was not well visualized. Pulmonic valve regurgitation is not visualized. Aorta: The aortic root and ascending aorta are structurally normal, with no evidence of dilitation. Venous: The inferior vena cava is normal in size with greater than 50% respiratory variability, suggesting right atrial pressure of 3 mmHg. IAS/Shunts: The atrial septum is grossly normal. Agitated saline contrast was given intravenously to evaluate for intracardiac shunting. Agitated saline contrast bubble study was negative, with no evidence of any interatrial shunt.  LEFT VENTRICLE PLAX 2D LVIDd:         4.50 cm   Diastology LVIDs:         3.20 cm   LV e' medial:    5.77 cm/s LV PW:         0.90 cm   LV E/e' medial:  16.5 LV IVS:        0.70 cm   LV e' lateral:   5.33 cm/s LVOT diam:     1.90 cm   LV E/e' lateral: 17.9 LV SV:         80 LV SV Index:   51 LVOT Area:     2.84 cm  RIGHT VENTRICLE RV Basal diam:  3.50 cm RV Mid diam:    2.70 cm RV S prime:     13.50 cm/s TAPSE (M-mode): 2.6 cm LEFT ATRIUM             Index        RIGHT ATRIUM           Index LA Vol (A2C):   50.8 ml 32.64 ml/m  RA Area:     16.80 cm LA Vol (A4C):   37.8 ml 24.29 ml/m  RA Volume:   39.90 ml  25.64 ml/m LA Biplane Vol: 43.8 ml 28.14 ml/m  AORTIC VALVE  PULMONIC VALVE AV Area (Vmax):    2.12 cm      PV Vmax:       1.07 m/s AV Area (Vmean):   2.01 cm      PV Vmean:      73.800 cm/s AV Area (VTI):     2.01 cm      PV VTI:        0.264 m AV Vmax:           162.00 cm/s   PV Peak grad:  4.6 mmHg AV Vmean:          111.000 cm/s  PV Mean grad:  3.0 mmHg AV VTI:             0.397 m AV Peak Grad:      10.5 mmHg AV Mean Grad:      6.0 mmHg LVOT Vmax:         121.00 cm/s LVOT Vmean:        78.800 cm/s LVOT VTI:          0.282 m LVOT/AV VTI ratio: 0.71 AI PHT:            548 msec  AORTA Ao Root diam: 2.50 cm Ao Asc diam:  2.80 cm MITRAL VALVE                TRICUSPID VALVE MV Area (PHT): 2.24 cm     TR Peak grad:   22.5 mmHg MV Decel Time: 338 msec     TR Vmax:        237.00 cm/s MV E velocity: 95.20 cm/s MV A velocity: 130.00 cm/s  SHUNTS MV E/A ratio:  0.73         Systemic VTI:  0.28 m                             Systemic Diam: 1.90 cm Keller Paterson Electronically signed by Keller Paterson Signature Date/Time: 07/24/2024/5:05:01 PM    Final     Scheduled Meds:  amLODipine   10 mg Oral Daily   atenolol   25 mg Oral Daily   colchicine   0.6 mg Oral Daily   cyanocobalamin   1,000 mcg Intramuscular Daily   Followed by   NOREEN ON 07/26/2024] vitamin B-12  1,000 mcg Oral Daily   diclofenac  Sodium  2 g Topical TID   enoxaparin  (LOVENOX ) injection  40 mg Subcutaneous Q24H   ketorolac   15 mg Intravenous Q6H   methadone   5 mg Oral Daily   methylPREDNISolone  (SOLU-MEDROL ) injection  40 mg Intravenous Daily   pantoprazole   40 mg Oral BID   pramipexole   1 mg Oral QPM   sucralfate   1 g Oral TID WC & HS   traZODone   100 mg Oral QPC supper   Continuous Infusions: PRN Meds: bisacodyl , diphenhydrAMINE , hydrALAZINE , ondansetron  **OR** ondansetron  (ZOFRAN ) IV, oxyCODONE , polyethylene glycol, tiZANidine , traZODone   Time spent: 35 minutes  Author: ELVAN SOR. MD [facility] 07/24/2024 6:17 PM  To reach On-call, see care teams to locate the attending and reach out to them via [email or web address]. If 7PM-7AM, please contact night-coverage If you still have difficulty reaching the attending provider, please page the [facility] (Director on Call) for [facility] on amion for assistance.

## 2024-07-25 ENCOUNTER — Other Ambulatory Visit: Payer: Self-pay

## 2024-07-25 ENCOUNTER — Encounter: Payer: Self-pay | Admitting: Oncology

## 2024-07-25 DIAGNOSIS — R0789 Other chest pain: Secondary | ICD-10-CM | POA: Diagnosis not present

## 2024-07-25 MED ORDER — TRAZODONE HCL 100 MG PO TABS
100.0000 mg | ORAL_TABLET | Freq: Every evening | ORAL | 1 refills | Status: AC | PRN
  Filled 2024-07-25: qty 30, 30d supply, fill #0

## 2024-07-25 MED ORDER — VITAMIN B-12 1000 MCG PO TABS
1000.0000 ug | ORAL_TABLET | Freq: Every day | ORAL | 2 refills | Status: AC
  Filled 2024-07-25: qty 30, 30d supply, fill #0

## 2024-07-25 MED ORDER — DICLOFENAC SODIUM 1 % EX GEL: 2.0000 g | Freq: Three times a day (TID) | CUTANEOUS | Status: AC

## 2024-07-25 NOTE — Discharge Summary (Signed)
 [facility] Discharge Summary   Patient: Heidi Hernandez FMW:[id number]  PCP: Gasper Nancyann BRAVO, MD  Date of admission: 07/23/2024   Date of discharge:  07/25/2024     Discharge Diagnoses:  Principal Problem:   Atypical chest pain Active Problems:   Restless legs syndrome   Aortic ectasia, thoracic   Essential hypertension   Elevated troponin   GERD (gastroesophageal reflux disease)   Admitted From: Home Disposition:  Home   Recommendations for Outpatient Follow-up:  Follow-up with PCP in 1 week continue to monitor BP and heart rate at home  Follow up LABS/TEST:     Follow-up Information     Gasper Nancyann BRAVO, MD Follow up in 1 week(s).   Specialty: Family Medicine Contact information: [address] [place] [address] [phone]                Diet recommendation: Cardiac diet  Activity: The patient is advised to gradually reintroduce usual activities, as tolerated  Discharge Condition: stable  Code Status: Full code   History of present illness: As per the H and P dictated on admission. Hospital Course:  Heidi Hernandez is a 74 y.o. female with history of aortic ectasia, HTN, GERD, ITP, MV prolapse, B12 deficiency, restless leg syndrome, presenting with acutely worsening left-sided substernal chest pain.  Patient states that her pain began this morning when she woke up however is acutely worsening in the last couple of hours.  Describes her pain as left-sided, substernal, sharp like someone is stabbing her with intermittent worsening like turning the knife.  Admits to worsening with big breaths.  Stated her pain feels similar to when she had pancreatitis in the past.  Concerned about her aortic aneurysm.  Denies any fever, shortness of breath, nausea, vomiting, abdominal pain.  Does admit to some loose stools.  No recent illness.  No change in medication regimen.  Patient admits to similar presentations to the ED for chest pain but this is  in a different area, previously more epigastric/central sternal, now left-sided.  Denies any radiation into her pain, sweating, diaphoresis.      Assessment and Plan:  # Atypical chest pain: Most likely costochondritis - Initial workup to rule out aortic aneurysm, dissection, pneumonia, pulmonary embolism.  CT angio negative for acute findings.   ECG noting no obvious ST elevation or depression.   S/p telemetry, Trop 18 >24>65>52 remained flat.  S/p scheduled Toradol  15 mg every 6 hours and s/p colchicine  1.2 mg and Opioids prn for pain. S/p Protonix  and sucralfate . 1/28 most likely costochondritis, reproducible chest pain. S/p Solu-Medrol  40 mg IV daily x 2 doses, Voltaren  gel 3 times daily applied to left-sided chest wall.  Continued other pain medications. Increase pantoprazole  40 mg p.o. twice daily 1/29 chest pain almost resolved, patient is feeling a lot better, does not want to continue prednisone .  Patient was advised to use ibuprofen  20 mg p.o. 3 times daily as needed and increase the dose of omeprazole.   Patient is requesting to be discharged, feels comfortable and stable.   # Mild troponin elevation - Troponins going from 18 up to 24.  Still very low and mostly flat.  Troponin 65>52 remained flat TTE: LVEF 60-65%, no WMA, grade 1 DD, no significant valvular abnormality.   # GERD: s/p PPI and Carafate  given during hospital stay.  Patient was advised to increase PPI if needed.   # HTN: - Resume patient's home amlodipine  10 mg daily and Atenolol  25  mg daily.  Monitor BP at home and follow with PCP.   # Restless leg syndrome: Resume patient's methadone  plus pramipexole .   # Insomnia: Started trazodone  100 mg p.o. nightly   # Vitamin B12 level 317, goal >400: Started vitamin B12 1000 mcg IM injection daily during hospital stay, followed by oral supplement.  Follow-up PCP to repeat vitamin B12 level after 3 to 6 months.    Body mass index is 21.26 kg/m.  Nutrition  Interventions:   - Patient was instructed, not to drive, operate heavy machinery, perform activities at heights, swimming or participation in water activities or provide baby sitting services while on Pain, Sleep and Anxiety Medications; until her outpatient Physician has advised to do so again.  - Also recommended to not to take more than prescribed Pain, Sleep and Anxiety Medications.  Patient was ambulatory without any assistance. On the day of the discharge the patient's vitals were stable, and no other acute medical condition were reported by patient. the patient was felt safe to be discharge at Home.  Consultants: None Procedures: None  Discharge Exam: General: Appear in no distress, no Rash; Oral Mucosa Clear, moist. Cardiovascular: S1 and S2 Present, no Murmur, Respiratory: normal respiratory effort, Bilateral Air entry present and no Crackles, no wheezes Abdomen: Bowel Sound present, Soft and no tenderness, no hernia Extremities: no Pedal edema, no calf tenderness Neurology: alert and oriented to time, place, and person affect appropriate.  Filed Weights   07/23/24 1304  Weight: 54.4 kg   Vitals:   07/25/24 1000 07/25/24 1002  BP:  130/62  Pulse: 66 61  Resp: 19 (!) 21  Temp:    SpO2: 100% 99%    DISCHARGE MEDICATION: Allergies as of 07/25/2024       Reactions   Paroxetine Other (See Comments)   Passed out Other Reaction(s): Unknown Other Reaction(s): Not available paroxetine hydrochloride   Alpha-lipoic Acid Nausea Only   Augmentin  [amoxicillin -pot Clavulanate] Nausea And Vomiting   Cat Dander    Other reaction(s): Not available   Dog Epithelium (canis Lupus Familiaris)    Other reaction(s): Not available   Droperidol Other (See Comments)   Locked jaw Facial muscles locked Locked jaw   Molds & Smuts    Other reaction(s): Not available   Paroxetine Hcl Other (See Comments)   syncope Passed out    Tree Extract    Other reaction(s): Not available    Morphine  Itching        Medication List     TAKE these medications    pramipexole  0.5 MG tablet Commonly known as: MIRAPEX  Take 1 tablet by mouth every evening. 4-5 pm What changed: how much to take The timing of this medication is very important.   albuterol  108 (90 Base) MCG/ACT inhaler Commonly known as: Ventolin  HFA Inhale 2 puffs into the lungs every 6 (six) hours as needed for wheezing. May substitute generic or equivalent brand name   alendronate  70 MG tablet Commonly known as: FOSAMAX  TAKE 1 TABLET BY MOUTH ONCE WEEKLY ON A EMPTY STOMACH BEFORE BREAKFAST. REMAIN UPRIGHT FOR 30 MINUTES AND TAKE WITH 8 OUNCES OF WATER   amLODipine  10 MG tablet Commonly known as: NORVASC  TAKE 1 TABLET BY MOUTH DAILY   atenolol  25 MG tablet Commonly known as: TENORMIN  TAKE 1 TABLET BY MOUTH DAILY   cetirizine  10 MG tablet Commonly known as: ZYRTEC  Take 1 tablet (10 mg total) by mouth daily.   cyanocobalamin  1000 MCG tablet Commonly known as: VITAMIN B12 Take  1 tablet (1,000 mcg total) by mouth daily.   diclofenac  Sodium 1 % Gel Commonly known as: VOLTAREN  Apply 2 g topically 3 (three) times daily.   methadone  5 MG tablet Commonly known as: DOLOPHINE  Take 5 mg by mouth daily.   omeprazole 20 MG capsule Commonly known as: PRILOSEC Take 20 mg by mouth daily.   SUMAtriptan  100 MG tablet Commonly known as: IMITREX  TAKE 1 TABLET BY MOUTH AT ONSET OF HEADACHE; MAY REPEAT 1 TABLET IN 2 HOURS IF NEEDED.   tiZANidine  4 MG tablet Commonly known as: ZANAFLEX  Take 4 mg by mouth at bedtime as needed for muscle spasms.   traMADol  50 MG tablet Commonly known as: ULTRAM  Take 50 mg by mouth every 6 (six) hours as needed for moderate pain (pain score 4-6).   traZODone  100 MG tablet Commonly known as: DESYREL  Take 1 tablet (100 mg total) by mouth at bedtime as needed for sleep.       Allergies[1] Discharge Instructions     Call MD for:  difficulty breathing, headache or  visual disturbances   Complete by: As directed    Call MD for:  extreme fatigue   Complete by: As directed    Call MD for:  persistant dizziness or light-headedness   Complete by: As directed    Call MD for:  persistant nausea and vomiting   Complete by: As directed    Call MD for:  severe uncontrolled pain   Complete by: As directed    Call MD for:  temperature >100.4   Complete by: As directed    Discharge instructions   Complete by: As directed    Follow-up with PCP in 1 week continue to monitor BP and heart rate at home   Increase activity slowly   Complete by: As directed        The results of significant diagnostics from this hospitalization (including imaging, microbiology, ancillary and laboratory) are listed below for reference.    Significant Diagnostic Studies: ECHOCARDIOGRAM COMPLETE Result Date: 07/24/2024    ECHOCARDIOGRAM REPORT   Patient Name:   ANNAMAE SHIVLEY Date of Exam: 07/24/2024 Medical Rec #:  [id number]        Height:       63.0 in Accession #:    [id number]       Weight:       120.0 lb Date of Birth:  [birth year]         BSA:          1.556 m Patient Age:    73 years         BP:           140/70 mmHg Patient Gender: F                HR:           56 bpm. Exam Location:  [facility] Procedure: 2D Echo, Cardiac Doppler, Color Doppler and Saline Contrast Bubble            Study (Both Spectral and Color Flow Doppler were utilized during            procedure). Indications:     Chest Pain  History:         Patient has no prior history of Echocardiogram examinations.                  Mitral Valve Prolapse; Signs/Symptoms:Chest Pain and Murmur.  Sonographer:     MR Referring Phys:  [id number] [facility]  XLFJM Diagnosing Phys: Keller Alluri IMPRESSIONS  1. Left ventricular ejection fraction, by estimation, is 60 to 65%. The left ventricle has normal function. The left ventricle has no regional wall motion abnormalities. Left ventricular diastolic parameters are consistent with Grade I  diastolic dysfunction (impaired relaxation).  2. Right ventricular systolic function is normal. The right ventricular size is normal.  3. The mitral valve is normal in structure. Trivial mitral valve regurgitation.  4. The aortic valve is tricuspid. Aortic valve regurgitation is mild.  5. The inferior vena cava is normal in size with greater than 50% respiratory variability, suggesting right atrial pressure of 3 mmHg.  6. Agitated saline contrast bubble study was negative, with no evidence of any interatrial shunt. FINDINGS  Left Ventricle: Left ventricular ejection fraction, by estimation, is 60 to 65%. The left ventricle has normal function. The left ventricle has no regional wall motion abnormalities. The left ventricular internal cavity size was normal in size. There is  no left ventricular hypertrophy. Left ventricular diastolic parameters are consistent with Grade I diastolic dysfunction (impaired relaxation). Right Ventricle: The right ventricular size is normal. No increase in right ventricular wall thickness. Right ventricular systolic function is normal. Left Atrium: Left atrial size was normal in size. Right Atrium: Right atrial size was normal in size. Pericardium: There is no evidence of pericardial effusion. Mitral Valve: The mitral valve is normal in structure. Trivial mitral valve regurgitation. Tricuspid Valve: The tricuspid valve is normal in structure. Tricuspid valve regurgitation is trivial. Aortic Valve: The aortic valve is tricuspid. Aortic valve regurgitation is mild. Aortic regurgitation PHT measures 548 msec. Aortic valve mean gradient measures 6.0 mmHg. Aortic valve peak gradient measures 10.5 mmHg. Aortic valve area, by VTI measures 2.01 cm. Pulmonic Valve: The pulmonic valve was not well visualized. Pulmonic valve regurgitation is not visualized. Aorta: The aortic root and ascending aorta are structurally normal, with no evidence of dilitation. Venous: The inferior vena cava is normal  in size with greater than 50% respiratory variability, suggesting right atrial pressure of 3 mmHg. IAS/Shunts: The atrial septum is grossly normal. Agitated saline contrast was given intravenously to evaluate for intracardiac shunting. Agitated saline contrast bubble study was negative, with no evidence of any interatrial shunt.  LEFT VENTRICLE PLAX 2D LVIDd:         4.50 cm   Diastology LVIDs:         3.20 cm   LV e' medial:    5.77 cm/s LV PW:         0.90 cm   LV E/e' medial:  16.5 LV IVS:        0.70 cm   LV e' lateral:   5.33 cm/s LVOT diam:     1.90 cm   LV E/e' lateral: 17.9 LV SV:         80 LV SV Index:   51 LVOT Area:     2.84 cm  RIGHT VENTRICLE RV Basal diam:  3.50 cm RV Mid diam:    2.70 cm RV S prime:     13.50 cm/s TAPSE (M-mode): 2.6 cm LEFT ATRIUM             Index        RIGHT ATRIUM           Index LA Vol (A2C):   50.8 ml 32.64 ml/m  RA Area:     16.80 cm LA Vol (A4C):   37.8 ml 24.29 ml/m  RA Volume:   39.90 ml  25.64 ml/m LA Biplane Vol: 43.8 ml 28.14 ml/m  AORTIC VALVE                     PULMONIC VALVE AV Area (Vmax):    2.12 cm      PV Vmax:       1.07 m/s AV Area (Vmean):   2.01 cm      PV Vmean:      73.800 cm/s AV Area (VTI):     2.01 cm      PV VTI:        0.264 m AV Vmax:           162.00 cm/s   PV Peak grad:  4.6 mmHg AV Vmean:          111.000 cm/s  PV Mean grad:  3.0 mmHg AV VTI:            0.397 m AV Peak Grad:      10.5 mmHg AV Mean Grad:      6.0 mmHg LVOT Vmax:         121.00 cm/s LVOT Vmean:        78.800 cm/s LVOT VTI:          0.282 m LVOT/AV VTI ratio: 0.71 AI PHT:            548 msec  AORTA Ao Root diam: 2.50 cm Ao Asc diam:  2.80 cm MITRAL VALVE                TRICUSPID VALVE MV Area (PHT): 2.24 cm     TR Peak grad:   22.5 mmHg MV Decel Time: 338 msec     TR Vmax:        237.00 cm/s MV E velocity: 95.20 cm/s MV A velocity: 130.00 cm/s  SHUNTS MV E/A ratio:  0.73         Systemic VTI:  0.28 m                             Systemic Diam: 1.90 cm Keller Paterson  Electronically signed by Keller Paterson Signature Date/Time: 07/24/2024/5:05:01 PM    Final    CT Angio Chest Aorta W and/or Wo Contrast Result Date: 07/23/2024 CLINICAL DATA:  Aortic aneurysm, chest pain. EXAM: CT ANGIOGRAPHY CHEST WITH CONTRAST TECHNIQUE: Multidetector CT imaging of the chest was performed using the standard protocol during bolus administration of intravenous contrast. Multiplanar CT image reconstructions and MIPs were obtained to evaluate the vascular anatomy. RADIATION DOSE REDUCTION: This exam was performed according to the departmental dose-optimization program which includes automated exposure control, adjustment of the mA and/or kV according to patient size and/or use of iterative reconstruction technique. CONTRAST:  75mL OMNIPAQUE  IOHEXOL  350 MG/ML SOLN COMPARISON:  Chest CTA 12/04/2023 and 02/06/2023 and 05/17/2022 FINDINGS: Cardiovascular: Aortic root measures 3.5 cm. Ascending thoracic aorta measures up to 3.6 cm and stable. No evidence for an aortic aneurysm. No evidence for an aortic dissection. Arch great vessels are patent. Left vertebral artery originates directly from the arch and this is a normal variant. Proximal descending thoracic aorta measures 2.8 cm. Atherosclerotic disease involving the descending thoracic aorta. Atherosclerotic disease in the abdominal aorta with normal caliber. Celiac trunk, SMA and bilateral renal arteries are patent. Mediastinum/Nodes: Esophagus is unremarkable. No mediastinal, hilar or axillary lymph node enlargement. Lungs/Pleura: Stable scarring at the lung apices. 8 mm nodular density on image 14, sequence 6 is  not significantly changed since 05/17/2022. There is also a stable punctate nodule at the right lung apex on image 18. Stable punctate nodule along the left major fissure in the left lower lobe superior segment on image 35. No airspace disease or consolidation in lungs. No pleural effusions. Stable punctate nodule in the left upper lobe on  image 85, sequence 6. Upper Abdomen: Chronic pneumobilia.  Splenectomy. Musculoskeletal: Right shoulder arthroplasty. Chronic vertebral body compression fracture at T5. No acute bone abnormality. Review of the MIP images confirms the above findings. IMPRESSION: 1. No acute chest abnormality. 2. Ascending thoracic aorta is stable in size. No evidence for an aortic aneurysm. 3.  Aortic Atherosclerosis (ICD10-I70.0). 4. Stable scarring at the lung apices. Stable small pulmonary nodules as described. Electronically Signed   By: Juliene Balder M.D.   On: 07/23/2024 14:55   DG Chest 2 View Result Date: 07/23/2024 CLINICAL DATA:  Chest pain. EXAM: CHEST - 2 VIEW COMPARISON:  12/04/2023 FINDINGS: Cardiomediastinal silhouette and pulmonary vasculature are within normal limits. Lungs are clear.  RIGHT shoulder prosthesis partially visualized. T5, T12, and L1 compression fractures are unchanged since prior CT from 12/04/2023. IMPRESSION: No acute cardiopulmonary process. Electronically Signed   By: Aliene Lloyd M.D.   On: 07/23/2024 14:20    Microbiology: No results found for this or any previous visit (from the past 240 hours).   Labs: CBC: Recent Labs  Lab 07/23/24 1335 07/24/24 0657  WBC 12.4* 8.7  HGB 14.0 12.3  HCT 40.5 37.2  MCV 94.4 97.1  PLT 147* 136*   Basic Metabolic Panel: Recent Labs  Lab 07/23/24 1335 07/24/24 0657  NA 144 139  K 4.4 3.7  CL 109 107  CO2 24 23  GLUCOSE 157* 85  BUN 17 17  CREATININE 0.70 0.64  CALCIUM  9.7 8.3*  MG  --  2.0   Liver Function Tests: Recent Labs  Lab 07/23/24 1517 07/24/24 0657  AST 20 21  ALT 11 7  ALKPHOS 79 60  BILITOT 0.2 0.4  PROT 6.6 5.9*  ALBUMIN 4.2 3.7   Recent Labs  Lab 07/23/24 1517  LIPASE 26   No results for input(s): AMMONIA in the last 168 hours. Cardiac Enzymes: Recent Labs  Lab 07/24/24 0657  CKTOTAL 68   BNP (last 3 results) No results for input(s): BNP in the last 8760 hours. CBG: No results for  input(s): GLUCAP in the last 168 hours.  Time spent: 35 minutes  Signed:  Elvan Sor  [facility] 07/25/2024 11:16 AM      [1]  Allergies Allergen Reactions   Paroxetine Other (See Comments)    Passed out  Other Reaction(s): Unknown  Other Reaction(s): Not available  paroxetine hydrochloride   Alpha-Lipoic Acid Nausea Only   Augmentin  [Amoxicillin -Pot Clavulanate] Nausea And Vomiting   Cat Dander     Other reaction(s): Not available   Dog Epithelium (Canis Lupus Familiaris)     Other reaction(s): Not available   Droperidol Other (See Comments)    Locked jaw Facial muscles locked Locked jaw   Molds & Smuts     Other reaction(s): Not available   Paroxetine Hcl Other (See Comments)    syncope Passed out    Tree Extract     Other reaction(s): Not available   Morphine  Itching

## 2024-07-26 LAB — C-REACTIVE PROTEIN: CRP: <0.5 mg/dL

## 2024-07-29 ENCOUNTER — Telehealth: Payer: Self-pay

## 2024-07-29 NOTE — Transitions of Care (Post Inpatient/ED Visit) (Signed)
 "  07/29/2024  Name: Heidi Hernandez MRN: [id number] DOB: 1950-11-30  Today's TOC FU Call Status: Today's TOC FU Call Status:: Successful TOC FU Call Completed TOC FU Call Complete Date: 07/29/24  Patient's Name and Date of Birth confirmed. DOB, Name  Transition Care Management Follow-up Telephone Call Date of Discharge: 07/25/24 Discharge Facility[facility]) Type of Discharge: Inpatient Admission Primary Inpatient Discharge Diagnosis:: Chest Pain How have you been since you were released from the hospital?: Better Any questions or concerns?: No  Items Reviewed: Did you receive and understand the discharge instructions provided?: Yes Medications obtained,verified, and reconciled?: Yes (Medications Reviewed) Any new allergies since your discharge?: No Dietary orders reviewed?: NA Do you have support at home?: Yes People in Home [RPT]: spouse  Medications Reviewed Today: Medications Reviewed Today     Reviewed by Lavelle Charmaine NOVAK, LPN (Licensed Practical Nurse) on 07/29/24 at [phone]  Med List Status: <None>   Medication Order Taking? Sig Documenting Provider Last Dose Status Informant  albuterol  (VENTOLIN  HFA) 108 (90 Base) MCG/ACT inhaler [id number] No Inhale 2 puffs into the lungs every 6 (six) hours as needed for wheezing. May substitute generic or equivalent brand name Wellington Curtis LABOR, FNP Unknown Active Self  alendronate  (FOSAMAX ) 70 MG tablet [id number] No TAKE 1 TABLET BY MOUTH ONCE WEEKLY ON A EMPTY STOMACH BEFORE BREAKFAST. REMAIN UPRIGHT FOR 30 MINUTES AND TAKE WITH 8 OUNCES OF WATER Gasper Nancyann BRAVO, MD 07/22/2024 Active Self  amLODipine  (NORVASC ) 10 MG tablet [id number] No TAKE 1 TABLET BY MOUTH DAILY Gasper Nancyann BRAVO, MD 07/22/2024 Active Self  atenolol  (TENORMIN ) 25 MG tablet [id number] No TAKE 1 TABLET BY MOUTH DAILY Gasper Nancyann BRAVO, MD 07/22/2024 Active Self  cetirizine  (ZYRTEC ) 10 MG tablet [id number] No Take 1 tablet (10 mg total) by mouth  daily. Ostwalt, Janna, PA-C Unknown Active Self  cyanocobalamin  (VITAMIN B12) 1000 MCG tablet [id number]  Take 1 tablet (1,000 mcg total) by mouth daily. Von Bellis, MD  Active   diclofenac  Sodium (VOLTAREN ) 1 % GEL [id number]  Apply 2 g topically 3 (three) times daily. Von Bellis, MD  Active   methadone  (DOLOPHINE ) 5 MG tablet [id number] No Take 5 mg by mouth daily. Alfredo Caldron, MD 07/22/2024 Active Self  omeprazole (PRILOSEC) 20 MG capsule [id number] No Take 20 mg by mouth daily. [provider] 07/22/2024 Active Self  pramipexole  (MIRAPEX ) 0.5 MG tablet [id number] No Take 1 tablet by mouth every evening. 4-5 pm  Patient taking differently: Take 2 tablets by mouth every evening. 4-5 pm   [provider] 07/22/2024 Active Self           Med Note DEBORA, CARYL-LYN M   Mon Jul 17, 2023 11:03 AM)    SUMAtriptan  (IMITREX ) 100 MG tablet [id number] No TAKE 1 TABLET BY MOUTH AT ONSET OF HEADACHE; MAY REPEAT 1 TABLET IN 2 HOURS IF NEEDED. Gasper Nancyann BRAVO, MD Unknown Active Self  tiZANidine  (ZANAFLEX ) 4 MG tablet [id number] No Take 4 mg by mouth at bedtime as needed for muscle spasms. [provider] 07/22/2024 Active Self  traMADol  (ULTRAM ) 50 MG tablet [id number] No Take 50 mg by mouth every 6 (six) hours as needed for moderate pain (pain score 4-6). [provider] 07/22/2024 Active Self  traZODone  (DESYREL ) 100 MG tablet [id number]  Take 1 tablet (100 mg total) by mouth at bedtime as needed for sleep. Von Bellis, MD  Active             Home Care  and Equipment/Supplies: Were Home Health Services Ordered?: NA Any new equipment or medical supplies ordered?: NA  Functional Questionnaire: Do you need assistance with bathing/showering or dressing?: No Do you need assistance with meal preparation?: No Do you need assistance with eating?: No Do you have difficulty maintaining continence: No Do you need assistance with getting out of bed/getting out of a  chair/moving?: No Do you have difficulty managing or taking your medications?: No  Follow up appointments reviewed: PCP Follow-up appointment confirmed?: Yes Date of PCP follow-up appointment?: 08/02/24 Follow-up Provider: Dr. Gasper Specialist [facility] Follow-up appointment confirmed?: NA Do you need transportation to your follow-up appointment?: No Do you understand care options if your condition(s) worsen?: Yes-patient verbalized understanding    SIGNATURE Charmaine Bloodgood, LPN [facility] Health Advisor [place] [facility] You Are. We Are. [facility] Direct Dial [phone]  "

## 2024-08-02 ENCOUNTER — Encounter: Payer: Self-pay | Admitting: Family Medicine

## 2024-08-02 ENCOUNTER — Ambulatory Visit: Admitting: Family Medicine

## 2024-08-02 VITALS — BP 125/56 | HR 57 | Resp 16 | Ht 63.0 in | Wt 120.0 lb

## 2024-08-02 DIAGNOSIS — I10 Essential (primary) hypertension: Secondary | ICD-10-CM

## 2024-08-02 DIAGNOSIS — I77810 Thoracic aortic ectasia: Secondary | ICD-10-CM

## 2024-08-02 DIAGNOSIS — M810 Age-related osteoporosis without current pathological fracture: Secondary | ICD-10-CM

## 2024-08-02 DIAGNOSIS — Z862 Personal history of diseases of the blood and blood-forming organs and certain disorders involving the immune mechanism: Secondary | ICD-10-CM

## 2024-08-02 DIAGNOSIS — F419 Anxiety disorder, unspecified: Secondary | ICD-10-CM

## 2024-08-02 DIAGNOSIS — K219 Gastro-esophageal reflux disease without esophagitis: Secondary | ICD-10-CM

## 2024-08-02 DIAGNOSIS — D849 Immunodeficiency, unspecified: Secondary | ICD-10-CM

## 2024-08-02 DIAGNOSIS — E538 Deficiency of other specified B group vitamins: Secondary | ICD-10-CM

## 2024-08-02 DIAGNOSIS — Z9081 Acquired absence of spleen: Secondary | ICD-10-CM

## 2024-08-02 DIAGNOSIS — G2581 Restless legs syndrome: Secondary | ICD-10-CM

## 2024-08-02 DIAGNOSIS — G4700 Insomnia, unspecified: Secondary | ICD-10-CM

## 2024-08-02 DIAGNOSIS — E559 Vitamin D deficiency, unspecified: Secondary | ICD-10-CM

## 2024-08-02 DIAGNOSIS — Z1159 Encounter for screening for other viral diseases: Secondary | ICD-10-CM

## 2024-08-02 DIAGNOSIS — I517 Cardiomegaly: Secondary | ICD-10-CM

## 2024-08-02 DIAGNOSIS — I341 Nonrheumatic mitral (valve) prolapse: Secondary | ICD-10-CM

## 2024-08-02 DIAGNOSIS — F341 Dysthymic disorder: Secondary | ICD-10-CM

## 2024-08-02 DIAGNOSIS — R7989 Other specified abnormal findings of blood chemistry: Secondary | ICD-10-CM

## 2024-08-02 DIAGNOSIS — K449 Diaphragmatic hernia without obstruction or gangrene: Secondary | ICD-10-CM

## 2024-08-02 DIAGNOSIS — J4521 Mild intermittent asthma with (acute) exacerbation: Secondary | ICD-10-CM

## 2024-08-02 NOTE — Progress Notes (Signed)
 "     Established patient visit   Patient: Heidi Hernandez   DOB: [birth year]   74 y.o. Female  MRN: [id number] Visit Date: 08/02/2024  Today's healthcare provider: Nancyann Perry, MD   Chief Complaint  Patient presents with   Hospitalization Follow-up    Hosp f/u . Pt wants to discuss her labs   Subjective    Discussed the use of AI scribe software for clinical note transcription with the patient, who gave verbal consent to proceed.  History of Present Illness   Heidi Hernandez is a 74 year old female who presents for an ER follow-up after experiencing severe chest pain.  She was seen in the emergency room on January 27th for severe chest pain rated as a ten out of ten, located centrally and radiating to her back. The pain has since decreased to a level two or three. She was treated with Toradol  injections and Voltaren  cream, which she continues to use.  During her ER visit, she had elevated troponin levels with values of 24, 65, and 66. She reports that during her ER visit, she had a CTA scan and x-ray, and was told her heart was fine. She has slightly enlarged thoracic aorta her mother had a history of aortic aneurysm.  She recently completed a course of prednisone  for a shoulder issue. She was also given trazodone  for sleep but has not needed it. She has tizanidine  prescribed and inquires if it is suitable for her current chest discomfort.  Her glucose levels were noted to be high during the ER visit, but have since normalized. She mentions low platelet levels, attributed to her lack of a spleen.        Medications: Show/hide medication list[1] Review of Systems     Objective    BP (!) 125/56 (BP Location: Left Arm, Patient Position: Sitting, Cuff Size: Normal)   Pulse (!) 57   Resp 16   Ht 5' 3 (1.6 m)   Wt 120 lb (54.4 kg)   SpO2 100%   BMI 21.26 kg/m   Physical Exam   General: Appearance:    Well developed, well nourished female in no acute distress  Eyes:     PERRL, conjunctiva/corneas clear, EOM's intact       Lungs:     Clear to auscultation bilaterally, respirations unlabored  Heart:    Bradycardic. Normal rhythm.  2/6 systolic murmur   MS:   All extremities are intact.    Neurologic:   Awake, alert, oriented x 3. No apparent focal neurological defect.          Assessment & Plan        Costochondritis Persistent chest pain reduced to 2-3/10, likely due to costochondritis. Managed with rest, diclofenac , and Toradol . No further prednisone  due to recent course for shoulder pain. - Continue Voltaren  cream for pain management. - Consider tizanidine  for muscle relaxation if needed.  Elevated troponin Elevated troponin levels noted during ER visit, increased from 24 to 65-66. No myocardial infarction based on CT scan and cardiac evaluation. Likely demand ischemia.    - Ordered repeat troponin test to assess current levels. - She has a previously scheduled appointment with cardiology next week and she is to advise the cardiologist about the ER visit and elevated troponin.        Nancyann Perry, MD  [facility] [phone] (phone) [phone] (fax)  [facility]    [1]  Outpatient Medications Prior to Visit  Medication Sig   albuterol  (VENTOLIN  HFA) 108 (90 Base) MCG/ACT inhaler Inhale 2 puffs into the lungs every 6 (six) hours as needed for wheezing. May substitute generic or equivalent brand name   alendronate  (FOSAMAX ) 70 MG tablet TAKE 1 TABLET BY MOUTH ONCE WEEKLY ON A EMPTY STOMACH BEFORE BREAKFAST. REMAIN UPRIGHT FOR 30 MINUTES AND TAKE WITH 8 OUNCES OF WATER   amLODipine  (NORVASC ) 10 MG tablet TAKE 1 TABLET BY MOUTH DAILY   atenolol  (TENORMIN ) 25 MG tablet TAKE 1 TABLET BY MOUTH DAILY   cetirizine  (ZYRTEC ) 10 MG tablet Take 1 tablet (10 mg total) by mouth daily.   cyanocobalamin  (VITAMIN B12) 1000 MCG tablet Take 1 tablet (1,000 mcg total) by mouth daily.   diclofenac  Sodium (VOLTAREN )  1 % GEL Apply 2 g topically 3 (three) times daily.   methadone  (DOLOPHINE ) 5 MG tablet Take 5 mg by mouth daily.   omeprazole (PRILOSEC) 20 MG capsule Take 20 mg by mouth daily.   pramipexole  (MIRAPEX ) 0.5 MG tablet Take 1 tablet by mouth every evening. 4-5 pm (Patient taking differently: Take 2 tablets by mouth every evening. 4-5 pm)   SUMAtriptan  (IMITREX ) 100 MG tablet TAKE 1 TABLET BY MOUTH AT ONSET OF HEADACHE; MAY REPEAT 1 TABLET IN 2 HOURS IF NEEDED.   tiZANidine  (ZANAFLEX ) 4 MG tablet Take 4 mg by mouth at bedtime as needed for muscle spasms.   traMADol  (ULTRAM ) 50 MG tablet Take 50 mg by mouth every 6 (six) hours as needed for moderate pain (pain score 4-6).   traZODone  (DESYREL ) 100 MG tablet Take 1 tablet (100 mg total) by mouth at bedtime as needed for sleep.   No facility-administered medications prior to visit.   "

## 2025-06-13 ENCOUNTER — Encounter: Admitting: Family Medicine

## 2025-06-17 ENCOUNTER — Ambulatory Visit
